# Patient Record
Sex: Male | Born: 1975
Health system: Southern US, Community
[De-identification: ages and names within clinical notes are randomized; demographics above are authoritative.]

## PROBLEM LIST (undated history)

## (undated) DIAGNOSIS — F4310 Post-traumatic stress disorder, unspecified: Secondary | ICD-10-CM

## (undated) DIAGNOSIS — I10 Essential (primary) hypertension: Secondary | ICD-10-CM

## (undated) DIAGNOSIS — R569 Unspecified convulsions: Secondary | ICD-10-CM

## (undated) DIAGNOSIS — E109 Type 1 diabetes mellitus without complications: Secondary | ICD-10-CM

## (undated) DIAGNOSIS — F32A Depression, unspecified: Secondary | ICD-10-CM

## (undated) HISTORY — DX: Type 1 diabetes mellitus without complications: E10.9

## (undated) HISTORY — DX: Depression, unspecified: F32.A

## (undated) HISTORY — DX: Post-traumatic stress disorder, unspecified: F43.10

## (undated) HISTORY — PX: HERNIA REPAIR: SHX51

---

## 1997-12-16 ENCOUNTER — Other Ambulatory Visit: Admission: RE | Admit: 1997-12-16 | Discharge: 1997-12-16 | Payer: Self-pay | Admitting: Family Medicine

## 1999-12-15 ENCOUNTER — Encounter: Payer: Self-pay | Admitting: Family Medicine

## 1999-12-15 ENCOUNTER — Ambulatory Visit (HOSPITAL_COMMUNITY): Admission: RE | Admit: 1999-12-15 | Discharge: 1999-12-15 | Payer: Self-pay | Admitting: Family Medicine

## 2006-06-13 ENCOUNTER — Ambulatory Visit: Payer: Self-pay | Admitting: Family Medicine

## 2006-06-27 ENCOUNTER — Emergency Department (HOSPITAL_COMMUNITY): Admission: EM | Admit: 2006-06-27 | Discharge: 2006-06-27 | Payer: Self-pay | Admitting: Emergency Medicine

## 2006-06-28 ENCOUNTER — Ambulatory Visit: Payer: Self-pay | Admitting: *Deleted

## 2006-07-12 ENCOUNTER — Ambulatory Visit: Payer: Self-pay | Admitting: Internal Medicine

## 2006-07-26 ENCOUNTER — Ambulatory Visit: Payer: Self-pay | Admitting: Internal Medicine

## 2006-07-30 ENCOUNTER — Ambulatory Visit: Payer: Self-pay | Admitting: Internal Medicine

## 2006-11-07 ENCOUNTER — Ambulatory Visit: Payer: Self-pay | Admitting: Family Medicine

## 2006-11-26 ENCOUNTER — Ambulatory Visit: Payer: Self-pay | Admitting: Family Medicine

## 2007-01-21 ENCOUNTER — Emergency Department (HOSPITAL_COMMUNITY): Admission: EM | Admit: 2007-01-21 | Discharge: 2007-01-21 | Payer: Self-pay | Admitting: Emergency Medicine

## 2007-03-26 ENCOUNTER — Encounter (INDEPENDENT_AMBULATORY_CARE_PROVIDER_SITE_OTHER): Payer: Self-pay | Admitting: *Deleted

## 2007-04-03 ENCOUNTER — Emergency Department (HOSPITAL_COMMUNITY): Admission: EM | Admit: 2007-04-03 | Discharge: 2007-04-03 | Payer: Self-pay | Admitting: Emergency Medicine

## 2007-04-04 ENCOUNTER — Encounter (INDEPENDENT_AMBULATORY_CARE_PROVIDER_SITE_OTHER): Payer: Self-pay | Admitting: Family Medicine

## 2007-04-04 ENCOUNTER — Ambulatory Visit: Payer: Self-pay

## 2007-04-04 ENCOUNTER — Encounter (INDEPENDENT_AMBULATORY_CARE_PROVIDER_SITE_OTHER): Payer: Self-pay | Admitting: *Deleted

## 2007-04-04 DIAGNOSIS — E108 Type 1 diabetes mellitus with unspecified complications: Secondary | ICD-10-CM | POA: Insufficient documentation

## 2007-04-04 LAB — CONVERTED CEMR LAB
BUN: 13 mg/dL (ref 6–23)
CO2: 23 meq/L (ref 19–32)
Calcium: 8.8 mg/dL (ref 8.4–10.5)
Chloride: 106 meq/L (ref 96–112)
Creatinine, Ser: 1.18 mg/dL (ref 0.40–1.50)
Glucose, Bld: 147 mg/dL — ABNORMAL HIGH (ref 70–99)
Hgb A1c MFr Bld: 8.5 %
Potassium: 4.2 meq/L (ref 3.5–5.3)
Protein, U semiquant: >=300
Sodium: 140 meq/L (ref 135–145)

## 2007-04-07 ENCOUNTER — Encounter (INDEPENDENT_AMBULATORY_CARE_PROVIDER_SITE_OTHER): Payer: Self-pay | Admitting: Family Medicine

## 2007-05-27 ENCOUNTER — Encounter: Admission: RE | Admit: 2007-05-27 | Discharge: 2007-05-27 | Payer: Self-pay | Admitting: Internal Medicine

## 2007-06-03 ENCOUNTER — Telehealth (INDEPENDENT_AMBULATORY_CARE_PROVIDER_SITE_OTHER): Payer: Self-pay | Admitting: Internal Medicine

## 2007-07-09 ENCOUNTER — Telehealth: Payer: Self-pay | Admitting: *Deleted

## 2007-07-15 ENCOUNTER — Telehealth: Payer: Self-pay | Admitting: *Deleted

## 2007-07-17 ENCOUNTER — Encounter (INDEPENDENT_AMBULATORY_CARE_PROVIDER_SITE_OTHER): Payer: Self-pay | Admitting: Family Medicine

## 2007-08-01 ENCOUNTER — Ambulatory Visit: Payer: Self-pay | Admitting: Family Medicine

## 2007-08-01 LAB — CONVERTED CEMR LAB: Hgb A1c MFr Bld: 10.9 %

## 2007-08-13 ENCOUNTER — Telehealth: Payer: Self-pay | Admitting: *Deleted

## 2007-08-27 ENCOUNTER — Telehealth: Payer: Self-pay | Admitting: *Deleted

## 2007-11-19 ENCOUNTER — Encounter (INDEPENDENT_AMBULATORY_CARE_PROVIDER_SITE_OTHER): Payer: Self-pay | Admitting: Family Medicine

## 2007-11-19 ENCOUNTER — Ambulatory Visit: Payer: Self-pay | Admitting: Family Medicine

## 2007-11-20 LAB — CONVERTED CEMR LAB
Chlamydia, DNA Probe: NEGATIVE
GC Probe Amp, Genital: NEGATIVE

## 2007-12-16 ENCOUNTER — Encounter: Payer: Self-pay | Admitting: Family Medicine

## 2007-12-16 ENCOUNTER — Observation Stay (HOSPITAL_COMMUNITY): Admission: EM | Admit: 2007-12-16 | Discharge: 2007-12-16 | Payer: Self-pay | Admitting: Emergency Medicine

## 2007-12-16 ENCOUNTER — Ambulatory Visit: Payer: Self-pay | Admitting: Family Medicine

## 2007-12-22 ENCOUNTER — Ambulatory Visit: Payer: Self-pay | Admitting: Family Medicine

## 2007-12-22 ENCOUNTER — Encounter (INDEPENDENT_AMBULATORY_CARE_PROVIDER_SITE_OTHER): Payer: Self-pay | Admitting: Family Medicine

## 2007-12-22 LAB — CONVERTED CEMR LAB: TSH: 1.208 microintl units/mL (ref 0.350–5.50)

## 2007-12-23 ENCOUNTER — Encounter (INDEPENDENT_AMBULATORY_CARE_PROVIDER_SITE_OTHER): Payer: Self-pay | Admitting: Family Medicine

## 2008-02-06 ENCOUNTER — Encounter: Payer: Self-pay | Admitting: *Deleted

## 2008-02-09 ENCOUNTER — Ambulatory Visit: Payer: Self-pay | Admitting: Sports Medicine

## 2008-02-09 ENCOUNTER — Encounter: Payer: Self-pay | Admitting: Family Medicine

## 2008-02-09 DIAGNOSIS — G478 Other sleep disorders: Secondary | ICD-10-CM | POA: Insufficient documentation

## 2008-02-09 DIAGNOSIS — F4321 Adjustment disorder with depressed mood: Secondary | ICD-10-CM | POA: Insufficient documentation

## 2008-02-09 LAB — CONVERTED CEMR LAB
ALT: 15 units/L (ref 0–53)
AST: 16 units/L (ref 0–37)
Albumin: 3.7 g/dL (ref 3.5–5.2)
Alkaline Phosphatase: 48 units/L (ref 39–117)
BUN: 20 mg/dL (ref 6–23)
CO2: 26 meq/L (ref 19–32)
Calcium: 8.7 mg/dL (ref 8.4–10.5)
Chloride: 102 meq/L (ref 96–112)
Creatinine, Ser: 1.21 mg/dL (ref 0.40–1.50)
Glucose, Bld: 328 mg/dL — ABNORMAL HIGH (ref 70–99)
HCT: 43.1 % (ref 39.0–52.0)
Hemoglobin: 14.4 g/dL (ref 13.0–17.0)
MCHC: 33.4 g/dL (ref 30.0–36.0)
MCV: 87.4 fL (ref 78.0–100.0)
Platelets: 245 10*3/uL (ref 150–400)
Potassium: 4.4 meq/L (ref 3.5–5.3)
RBC: 4.93 M/uL (ref 4.22–5.81)
RDW: 13.9 % (ref 11.5–15.5)
Sodium: 137 meq/L (ref 135–145)
Total Bilirubin: 0.7 mg/dL (ref 0.3–1.2)
Total Protein: 6 g/dL (ref 6.0–8.3)
WBC: 5.1 10*3/uL (ref 4.0–10.5)

## 2008-02-17 ENCOUNTER — Telehealth: Payer: Self-pay | Admitting: Family Medicine

## 2008-02-24 ENCOUNTER — Telehealth: Payer: Self-pay | Admitting: Family Medicine

## 2008-02-27 ENCOUNTER — Ambulatory Visit: Payer: Self-pay | Admitting: Family Medicine

## 2008-03-02 ENCOUNTER — Telehealth: Payer: Self-pay | Admitting: Psychology

## 2008-03-05 ENCOUNTER — Encounter: Payer: Self-pay | Admitting: *Deleted

## 2008-03-08 ENCOUNTER — Ambulatory Visit: Payer: Self-pay | Admitting: Family Medicine

## 2008-03-11 ENCOUNTER — Ambulatory Visit: Payer: Self-pay | Admitting: Family Medicine

## 2008-03-18 ENCOUNTER — Ambulatory Visit: Payer: Self-pay | Admitting: Family Medicine

## 2008-03-24 ENCOUNTER — Ambulatory Visit: Payer: Self-pay | Admitting: Family Medicine

## 2008-03-24 DIAGNOSIS — I10 Essential (primary) hypertension: Secondary | ICD-10-CM | POA: Insufficient documentation

## 2008-03-24 DIAGNOSIS — G479 Sleep disorder, unspecified: Secondary | ICD-10-CM | POA: Insufficient documentation

## 2008-03-24 LAB — CONVERTED CEMR LAB: Hgb A1c MFr Bld: 8.7 %

## 2008-03-26 ENCOUNTER — Ambulatory Visit: Payer: Self-pay | Admitting: Family Medicine

## 2008-03-26 DIAGNOSIS — F4310 Post-traumatic stress disorder, unspecified: Secondary | ICD-10-CM | POA: Insufficient documentation

## 2008-04-01 ENCOUNTER — Encounter: Payer: Self-pay | Admitting: *Deleted

## 2008-04-02 ENCOUNTER — Encounter: Payer: Self-pay | Admitting: *Deleted

## 2008-04-06 ENCOUNTER — Encounter: Payer: Self-pay | Admitting: Family Medicine

## 2008-04-06 ENCOUNTER — Telehealth: Payer: Self-pay | Admitting: Psychology

## 2008-04-07 ENCOUNTER — Encounter: Payer: Self-pay | Admitting: Family Medicine

## 2008-04-08 ENCOUNTER — Ambulatory Visit: Payer: Self-pay | Admitting: Psychology

## 2008-04-14 ENCOUNTER — Ambulatory Visit: Payer: Self-pay | Admitting: Family Medicine

## 2008-04-27 ENCOUNTER — Ambulatory Visit (HOSPITAL_BASED_OUTPATIENT_CLINIC_OR_DEPARTMENT_OTHER): Admission: RE | Admit: 2008-04-27 | Discharge: 2008-04-27 | Payer: Self-pay | Admitting: Family Medicine

## 2008-04-30 ENCOUNTER — Emergency Department (HOSPITAL_COMMUNITY): Admission: EM | Admit: 2008-04-30 | Discharge: 2008-04-30 | Payer: Self-pay | Admitting: Emergency Medicine

## 2008-05-01 ENCOUNTER — Ambulatory Visit: Payer: Self-pay | Admitting: Internal Medicine

## 2008-06-17 ENCOUNTER — Telehealth: Payer: Self-pay | Admitting: *Deleted

## 2008-06-18 ENCOUNTER — Encounter (INDEPENDENT_AMBULATORY_CARE_PROVIDER_SITE_OTHER): Payer: Self-pay | Admitting: Family Medicine

## 2008-06-18 ENCOUNTER — Ambulatory Visit: Payer: Self-pay | Admitting: Family Medicine

## 2008-06-18 DIAGNOSIS — L02818 Cutaneous abscess of other sites: Secondary | ICD-10-CM | POA: Insufficient documentation

## 2008-06-18 DIAGNOSIS — L03818 Cellulitis of other sites: Secondary | ICD-10-CM

## 2008-06-21 ENCOUNTER — Telehealth: Payer: Self-pay | Admitting: *Deleted

## 2008-07-26 ENCOUNTER — Telehealth: Payer: Self-pay | Admitting: Psychology

## 2008-10-05 ENCOUNTER — Encounter (INDEPENDENT_AMBULATORY_CARE_PROVIDER_SITE_OTHER): Payer: Self-pay | Admitting: Internal Medicine

## 2008-10-21 ENCOUNTER — Ambulatory Visit: Payer: Self-pay | Admitting: Internal Medicine

## 2008-10-21 ENCOUNTER — Telehealth (INDEPENDENT_AMBULATORY_CARE_PROVIDER_SITE_OTHER): Payer: Self-pay | Admitting: Internal Medicine

## 2008-10-21 DIAGNOSIS — L0202 Furuncle of face: Secondary | ICD-10-CM | POA: Insufficient documentation

## 2008-10-21 DIAGNOSIS — L0203 Carbuncle of face: Secondary | ICD-10-CM

## 2008-10-22 ENCOUNTER — Encounter (INDEPENDENT_AMBULATORY_CARE_PROVIDER_SITE_OTHER): Payer: Self-pay | Admitting: Internal Medicine

## 2008-10-22 ENCOUNTER — Telehealth (INDEPENDENT_AMBULATORY_CARE_PROVIDER_SITE_OTHER): Payer: Self-pay | Admitting: Internal Medicine

## 2008-11-01 ENCOUNTER — Telehealth (INDEPENDENT_AMBULATORY_CARE_PROVIDER_SITE_OTHER): Payer: Self-pay | Admitting: Internal Medicine

## 2008-11-02 ENCOUNTER — Inpatient Hospital Stay (HOSPITAL_COMMUNITY): Admission: EM | Admit: 2008-11-02 | Discharge: 2008-11-03 | Payer: Self-pay | Admitting: *Deleted

## 2008-11-05 ENCOUNTER — Telehealth (INDEPENDENT_AMBULATORY_CARE_PROVIDER_SITE_OTHER): Payer: Self-pay | Admitting: Internal Medicine

## 2008-11-19 ENCOUNTER — Ambulatory Visit: Payer: Self-pay | Admitting: "Endocrinology

## 2008-12-31 ENCOUNTER — Ambulatory Visit: Payer: Self-pay | Admitting: Internal Medicine

## 2008-12-31 DIAGNOSIS — K047 Periapical abscess without sinus: Secondary | ICD-10-CM | POA: Insufficient documentation

## 2009-02-02 ENCOUNTER — Telehealth (INDEPENDENT_AMBULATORY_CARE_PROVIDER_SITE_OTHER): Payer: Self-pay | Admitting: Internal Medicine

## 2009-02-23 ENCOUNTER — Ambulatory Visit: Payer: Self-pay | Admitting: "Endocrinology

## 2009-04-19 ENCOUNTER — Telehealth (INDEPENDENT_AMBULATORY_CARE_PROVIDER_SITE_OTHER): Payer: Self-pay | Admitting: Internal Medicine

## 2009-05-05 ENCOUNTER — Ambulatory Visit: Payer: Self-pay | Admitting: Internal Medicine

## 2009-06-16 ENCOUNTER — Ambulatory Visit: Payer: Self-pay | Admitting: Internal Medicine

## 2009-06-16 DIAGNOSIS — E559 Vitamin D deficiency, unspecified: Secondary | ICD-10-CM | POA: Insufficient documentation

## 2009-06-17 ENCOUNTER — Telehealth (INDEPENDENT_AMBULATORY_CARE_PROVIDER_SITE_OTHER): Payer: Self-pay | Admitting: Internal Medicine

## 2009-06-19 ENCOUNTER — Emergency Department (HOSPITAL_COMMUNITY): Admission: EM | Admit: 2009-06-19 | Discharge: 2009-06-19 | Payer: Self-pay | Admitting: Emergency Medicine

## 2009-06-28 DIAGNOSIS — E7800 Pure hypercholesterolemia, unspecified: Secondary | ICD-10-CM | POA: Insufficient documentation

## 2009-06-28 LAB — CONVERTED CEMR LAB
ALT: 12 units/L (ref 0–53)
CO2: 25 meq/L (ref 19–32)
Calcium: 9.5 mg/dL (ref 8.4–10.5)
Chloride: 101 meq/L (ref 96–112)
Cholesterol: 258 mg/dL — ABNORMAL HIGH (ref 0–200)
Creatinine, Ser: 1.33 mg/dL (ref 0.40–1.50)
Eosinophils Absolute: 0.1 10*3/uL (ref 0.0–0.7)
Eosinophils Relative: 2 % (ref 0–5)
HCT: 48.4 % (ref 39.0–52.0)
Hemoglobin: 16.2 g/dL (ref 13.0–17.0)
Lymphs Abs: 1.4 10*3/uL (ref 0.7–4.0)
MCV: 87.5 fL (ref 78.0–100.0)
Microalb, Ur: 137.1 mg/dL — ABNORMAL HIGH (ref 0.00–1.89)
Monocytes Relative: 6 % (ref 3–12)
Neutrophils Relative %: 61 % (ref 43–77)
RBC: 5.53 M/uL (ref 4.22–5.81)
Total CHOL/HDL Ratio: 3.4
Vit D, 25-Hydroxy: 15 ng/mL — ABNORMAL LOW (ref 30–89)
WBC: 4.9 10*3/uL (ref 4.0–10.5)

## 2009-07-05 ENCOUNTER — Encounter (INDEPENDENT_AMBULATORY_CARE_PROVIDER_SITE_OTHER): Payer: Self-pay | Admitting: *Deleted

## 2009-08-16 ENCOUNTER — Telehealth (INDEPENDENT_AMBULATORY_CARE_PROVIDER_SITE_OTHER): Payer: Self-pay | Admitting: *Deleted

## 2009-08-18 ENCOUNTER — Encounter (INDEPENDENT_AMBULATORY_CARE_PROVIDER_SITE_OTHER): Payer: Self-pay | Admitting: Internal Medicine

## 2009-08-19 ENCOUNTER — Encounter (INDEPENDENT_AMBULATORY_CARE_PROVIDER_SITE_OTHER): Payer: Self-pay | Admitting: Internal Medicine

## 2009-09-22 ENCOUNTER — Telehealth (INDEPENDENT_AMBULATORY_CARE_PROVIDER_SITE_OTHER): Payer: Self-pay | Admitting: Internal Medicine

## 2009-10-08 ENCOUNTER — Emergency Department (HOSPITAL_COMMUNITY): Admission: EM | Admit: 2009-10-08 | Discharge: 2009-10-08 | Payer: Self-pay | Admitting: Emergency Medicine

## 2009-11-08 ENCOUNTER — Ambulatory Visit: Payer: Self-pay | Admitting: "Endocrinology

## 2009-11-15 ENCOUNTER — Ambulatory Visit: Payer: Self-pay | Admitting: Internal Medicine

## 2009-11-15 DIAGNOSIS — F319 Bipolar disorder, unspecified: Secondary | ICD-10-CM | POA: Insufficient documentation

## 2009-11-15 LAB — CONVERTED CEMR LAB
Blood Glucose, Fingerstick: 209
Hgb A1c MFr Bld: 9 %

## 2009-11-25 ENCOUNTER — Ambulatory Visit: Payer: Self-pay | Admitting: Internal Medicine

## 2009-12-04 LAB — CONVERTED CEMR LAB
ALT: 12 units/L (ref 0–53)
Albumin: 4.2 g/dL (ref 3.5–5.2)
CO2: 28 meq/L (ref 19–32)
Calcium: 10 mg/dL (ref 8.4–10.5)
Chloride: 102 meq/L (ref 96–112)
Cholesterol: 240 mg/dL — ABNORMAL HIGH (ref 0–200)
FSH: 2.1 milliintl units/mL (ref 1.4–18.1)
Free T4: 1.05 ng/dL (ref 0.80–1.80)
LH: 6.2 milliintl units/mL (ref 1.5–9.3)
Microalb Creat Ratio: 1291 mg/g — ABNORMAL HIGH (ref 0.0–30.0)
Potassium: 5.2 meq/L (ref 3.5–5.3)
Sodium: 139 meq/L (ref 135–145)
T3, Free: 3.1 pg/mL (ref 2.3–4.2)
Testosterone Free: 112.5 pg/mL (ref 47.0–244.0)
Thyroperoxidase Ab SerPl-aCnc: 45.7 (ref 0.0–60.0)
Total Protein: 6.9 g/dL (ref 6.0–8.3)

## 2009-12-13 ENCOUNTER — Encounter (INDEPENDENT_AMBULATORY_CARE_PROVIDER_SITE_OTHER): Payer: Self-pay | Admitting: Internal Medicine

## 2009-12-14 ENCOUNTER — Telehealth (INDEPENDENT_AMBULATORY_CARE_PROVIDER_SITE_OTHER): Payer: Self-pay | Admitting: Internal Medicine

## 2009-12-16 ENCOUNTER — Emergency Department (HOSPITAL_COMMUNITY): Admission: EM | Admit: 2009-12-16 | Discharge: 2009-12-16 | Payer: Self-pay | Admitting: Emergency Medicine

## 2010-01-17 ENCOUNTER — Ambulatory Visit: Payer: Self-pay | Admitting: Internal Medicine

## 2010-02-02 ENCOUNTER — Encounter (INDEPENDENT_AMBULATORY_CARE_PROVIDER_SITE_OTHER): Payer: Self-pay | Admitting: Internal Medicine

## 2010-02-25 ENCOUNTER — Emergency Department (HOSPITAL_COMMUNITY): Admission: EM | Admit: 2010-02-25 | Discharge: 2010-02-25 | Payer: Self-pay | Admitting: Family Medicine

## 2010-07-10 ENCOUNTER — Encounter (INDEPENDENT_AMBULATORY_CARE_PROVIDER_SITE_OTHER): Payer: Self-pay | Admitting: Internal Medicine

## 2010-07-10 DIAGNOSIS — E1149 Type 2 diabetes mellitus with other diabetic neurological complication: Secondary | ICD-10-CM | POA: Insufficient documentation

## 2010-07-28 ENCOUNTER — Ambulatory Visit: Admit: 2010-07-28 | Payer: Self-pay | Admitting: Internal Medicine

## 2010-08-08 NOTE — Assessment & Plan Note (Signed)
Summary: DM FU/PER Kaye Mitro FROM 04/05 APT//KT   Vital Signs:  Patient profile:   35 year old male Weight:      183 pounds Temp:     97.9 degrees F Pulse rate:   79 / minute Pulse rhythm:   regular Resp:     18 per minute BP sitting:   148 / 79  (left arm) Cuff size:   regular  Vitals Entered By: Vesta Mixer CMA (Nov 15, 2009 3:40 PM) CC: f/u diabetes.  BP is elevated, just finished weight lifting.  Saw endocrinologist last week Is Patient Diabetic? Yes Pain Assessment Patient in pain? yes     Location: left pink finger Intensity: 7 CBG Result 209  Does patient need assistance? Ambulation Normal   Primary Care Provider:  Eustaquio Boyden  MD  CC:  f/u diabetes.  BP is elevated and just finished weight lifting.  Saw endocrinologist last week.  History of Present Illness: 1.  IDDM:  Back to following with Dr. Margit Banda time in 6 months.  Pt. states his A1C was 7.6%.  Not having lows during exercise as was having previously as he is having more snacks prior to activity.  Has gained weight, but feel fit.  Concern for bottoming out with blood sugars while sleeping as his sugars are highest in morning.  Has had some difficulty with thingking at school at times--he feels this is another reason why concern for his sugars bottoming out.  Dr. Fransico Michael is asking him to awaken at 3 a.m. to check sugars.  Pt. still taking Lantus 30 units in the morning--cuts down on lows during day for him.  Unknown SS with Novolog--pt. cannot recall what it is.  Starts lower than 170 now.  To bring in sugars to Dr. Fransico Michael in 1 week.  2.  Vitamin D deficiency:  Not clear if ever received our letter to take another round of Vitamin D.    3.  Hypercholesterolemia:  Again--noted in December, but never heard back from him.  Pt. was not eating well when cholesterol checked in December.    4.  Bipolar Disorder:  Diagnosed inb 2007.  pt. receiving care at [facility] psychiatry.  ?[place].   Was on Geodon at  one time, but had side effects--not clear what.  Pt. feels this has caused him difficulties in controlling his other health concerns this year.  Has also affected his school work.  Has an appt. 5/12 and will decide about starting a new med.    5.  Tinea pedis:  pt. states Dr. Fransico Michael told him he has this.    Allergies (verified): 1)  ! * Geodon  Family History: HTN DM 1 in uncle, DM2 in grandma.  l Mother, late 84s:  Bipolar Disorder.   Father, 64:  pt. feels his father has undiagnosed bipolar disorder.  Physical Exam  Extremities:  Feet mildly erythematous and flaking , particularly in between toes.   Impression & Recommendations:  Problem # 1:  BIPOLAR DISORDER UNSPECIFIED (ICD-296.80) As per [facility].  Problem # 2:  HYPERCHOLESTEROLEMIA (ICD-272.0) Pt. to return in 1 week for fasting lipids--to bring in lab orders from Dr. Fransico Michael and can do those as well.  Problem # 3:  ESSENTIAL HYPERTENSION, BENIGN (ICD-401.1) BP up a bit--stressed.   Will see what he is doing with lipid check in 1 week. His updated medication list for this problem includes:    Lisinopril 10 Mg Tabs (Lisinopril) .Marland Kitchen... 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 Fransico Michael His updated medication list for this problem includes:    Lisinopril 10 Mg Tabs (Lisinopril) .Marland Kitchen... Take one by mouth daily    Lantus Solostar 100 Unit/ml Soln (Insulin glargine) .Marland KitchenMarland KitchenMarland KitchenMarland Kitchen 30 subcutaneously at bedtime--dr. Fransico Michael    Novolog Flexpen 100 Unit/ml Soln (Insulin aspart) .Marland Kitchen... Per pt., sliding scale does not start until above 170.  dr Fransico Michael  Problem # 5:  UNSPECIFIED VITAMIN D DEFICIENCY (ICD-268.9) Another 8 weeks of replacement.  Complete Medication List: 1)  Lisinopril 10 Mg Tabs (Lisinopril) .... Take one by mouth daily 2)  Accu-chek Spirit Insulin Pump Kit (Insulin infusion pump) 3)  Lantus Solostar 100 Unit/ml Soln (Insulin glargine) .... 30 subcutaneously at  bedtime--dr. brennan 4)  Novolog Flexpen 100 Unit/ml Soln (Insulin aspart) .... Per pt., sliding scale does not start until above 170.  dr Fransico Michael 5)  Vitamin D (ergocalciferol) 50000 Unit Caps (Ergocalciferol) .Marland Kitchen.. 1 cap by mouth weekly for 8 weeks 6)  Lotrimin Af 1 % Crea (Clotrimazole) .... Apply two times a day to entire foot and between toes until rash gone  Other Orders: Capillary Blood Glucose/CBG ([address])  Patient Instructions: 1)  Lamisil cream instead of Lotrimin if it is not too expensive--use two times a day until rash on feet gone. 2)  Spray shoes with Lysol after each wearing and allow to dry for 24 hours--alternate wearing of footwear.   3)  Lab visit in next week for FLP--272.0--BP check then 4)  Vitamin D level in 9 weeks--lab visit only. 5)  Follow up with Dr. Delrae Alfred in 3 months --tinea pedis, cholesterol, DM 6)  Bring in list of labs ordered by Dr. Fransico Michael and we can do them here. Prescriptions: LOTRIMIN AF 1 % CREA (CLOTRIMAZOLE) apply two times a day to entire foot and between toes until rash gone  #60 g x 1   Entered and Authorized by:   Julieanne Manson MD   Signed by:   Julieanne Manson MD on 11/15/2009   Method used:   Faxed to ...       [facility] - Pharmac (retail)       [address] [place], [place]  [address]       Ph: [phone]       Fax: [phone]   RxID:   [id number] VITAMIN D (ERGOCALCIFEROL) 50000 UNIT CAPS (ERGOCALCIFEROL) 1 cap by mouth weekly for 8 weeks  #4 x 1   Entered and Authorized by:   Julieanne Manson MD   Signed by:   Julieanne Manson MD on 11/15/2009   Method used:   Faxed to ...       [facility] - Pharmac (retail)       [address] [place], [place]  [address]       Ph: [phone]       Fax: [phone]   RxID:   [id number]

## 2010-08-08 NOTE — Letter (Signed)
Summary: Generic Letter  [facility]  [address] [place], [place] [address]   Phone: [phone]  Fax: [phone]    08/18/2009  Ms. Melissa Cox Disability Architect [employer] [address] [place], [place] [address] Fax:  [phone]  Re:  DEROY NOAH      [address] [place], [place]  [address]   Dear Ms. Cox:  Mr. Emert' health issues are as follows:   1.  Insulin Dependent Diabetes Mellitus 2.  Hypertension 3.  Hypercholesterolemia 4.  Hx of PTSD (he does not receive treatment for this at our clinic) 5.  Hx of Depression (as with PTSD for treatment)  He really does not have any activity restrictions if he is eating and using insulin wisely and appropriately.  Compliance with treatment has  been an issue in the past, however.  His diabetes was under pretty good control with Dr. Fransico Michael, endocrinology until recent months.  I am not sure if he has gone back to Dr. Fransico Michael since I last saw Mr. Bitting in December.       Sincerely,   Julieanne Manson MD

## 2010-08-08 NOTE — Letter (Signed)
Summary: *HSN Results Follow up  [facility]  [address] [place], [place] [address]   Phone: [phone]  Fax: [phone]      02/02/2010   [name] [address] [place], [place]  [address]   Dear  [name],                            ____S.Drinkard,FNP   ____D. Gore,FNP       ____B. McPherson,MD   ____V. Rankins,MD    __X__E. Chelise Hanger,MD    ____N. Daphine Deutscher, FNP  ____D. Reche Dixon, MD    ____K. Philipp Deputy, MD    ____Other     This letter is to inform you that your recent test(s):  _______Pap Smear    ___X____Lab Test     _______X-ray    ____X___ is within acceptable limits  _______ requires a medication change  _______ requires a follow-up lab visit  _______ requires a follow-up visit with your provider   Comments:  Vitamin D level was good with this check.  Just recommend you get 400-800 International Units every day--can get this over the counter       _________________________________________________________ If you have any questions, please contact our office                     Sincerely,  Donald Manson MD [facility]

## 2010-08-08 NOTE — Progress Notes (Signed)
Summary: THINKS HE HAS EAR INFECTON  Phone Note Call from Patient Call back at Home Phone [phone]   Reason for Call: Acute Illness Summary of Call: [name] [name] THINKS HE HAS A EAR INFECTION IN HIS RIGHT EAR. HE SAYS THAT WHEN HE SWALLOWS THE INSIDE IF HIS THROAT HURTS, BUT HE HAS PAIN IN THE EAR AND IT GOES UP TO THE TOP OF HIS HEAD. Initial call taken by: Leodis Rains,  December 14, 2009 9:16 AM  Follow-up for Phone Call        Spoke with pt. and discussed symptoms -- states "shattering" pain to head and ear when he swallows, started yesterday.  Voice very weak on the phone- seems to have difficulty talking. Advised to go to urgent care.  Dutch Quint RN  December 14, 2009 11:56 AM   Additional Follow-up for Phone Call Additional follow up Details #1::        I'm assuming there were no openings today. Additional Follow-up by: Julieanne Manson MD,  December 14, 2009 2:04 PM    Additional Follow-up for Phone Call Additional follow up Details #2::    We checked several times and there was no availability.  Dutch Quint RN  December 14, 2009 2:08 PM

## 2010-08-08 NOTE — Letter (Signed)
Summary: PEDIATRIC SUBSPECIALIST  PEDIATRIC SUBSPECIALIST   Imported By: Arta Bruce 01/16/2010 10:51:11  _____________________________________________________________________  External Attachment:    Type:   Image     Comment:   External Document

## 2010-08-08 NOTE — Progress Notes (Signed)
  Phone Note Other Incoming   Summary of Call: FAX FROM [facility]//REQUESTING RECORDS//PREFER TO HAVE LETTER FROM DR. Dannielle Karvonen IS IN YOU R REFILL SLOT Initial call taken by: Arta Bruce,  August 16, 2009 10:55 AM  Follow-up for Phone Call        Letter complete--please fax    Additional Follow-up for Phone Call Additional follow up Details #2::    FAXED LETTER Follow-up by: Arta Bruce,  August 19, 2009 10:28 AM

## 2010-08-08 NOTE — Progress Notes (Signed)
Summary: REFILL ON HIS NOVOLOG FLEX PEN  Phone Note Call from Patient Call back at Home Phone [phone]   Reason for Call: Refill Medication Summary of Call: [name] PT. MR [name] CALLED AND SAYS THAT HE WILL NEED A REFILL ON HIS NOVOLOG FLEXPEN. HE SAYS THAT HE HAS ENOUGH TO LAST HIM UNTIL NEXT WEDNESDAY. [facility]. Initial call taken by: Leodis Rains,  September 22, 2009 4:27 PM  Follow-up for Phone Call        Fwd to Dr. Delrae Alfred for refill [name]. Follow-up by: Vesta Mixer CMA,  September 22, 2009 4:39 PM    Prescriptions: NOVOLOG FLEXPEN 100 UNIT/ML SOLN (INSULIN ASPART) Per pt., sliding scale does not start until above 170.  Dr Fransico Michael  #1 month x 11   Entered and Authorized by:   Julieanne Manson MD   Signed by:   Julieanne Manson MD on 09/23/2009   Method used:   Faxed to ...       [facility] - Pharmac (retail)       [address] [place], [place]  [address]       Ph: [phone]       Fax: [phone]   RxID:   [id number]

## 2010-08-08 NOTE — Letter (Signed)
Summary: REQUEST FOR LETTER REGARDING MEDICAL CONDITION//FAXED  REQUEST FOR LETTER REGARDING MEDICAL CONDITION//FAXED   Imported By: Arta Bruce 08/19/2009 11:34:21  _____________________________________________________________________  External Attachment:    Type:   Image     Comment:   External Document

## 2010-08-10 NOTE — Miscellaneous (Signed)
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

## 2010-08-10 NOTE — Letter (Signed)
Summary: PEDIATRIC SUB-SPECIALIST OF [place]  PEDIATRIC SUB-SPECIALIST OF [place]   Imported By: Arta Bruce 07/18/2010 11:31:37  _____________________________________________________________________  External Attachment:    Type:   Image     Comment:   External Document

## 2010-10-10 LAB — GLUCOSE, CAPILLARY: Glucose-Capillary: 225 mg/dL — ABNORMAL HIGH (ref 70–99)

## 2010-10-10 LAB — BASIC METABOLIC PANEL
Calcium: 8.8 mg/dL (ref 8.4–10.5)
Creatinine, Ser: 1.26 mg/dL (ref 0.4–1.5)
GFR calc Af Amer: >60 mL/min (ref >60)
GFR calc non Af Amer: >60 mL/min (ref >60)
Sodium: 133 mEq/L — ABNORMAL LOW (ref 135–145)

## 2010-10-18 LAB — CBC
HCT: 37 % — ABNORMAL LOW (ref 39.0–52.0)
HCT: 43.6 % (ref 39.0–52.0)
Hemoglobin: 12.6 g/dL — ABNORMAL LOW (ref 13.0–17.0)
MCV: 86.6 fL (ref 78.0–100.0)
Platelets: 266 10*3/uL (ref 150–400)
RBC: 4.29 MIL/uL (ref 4.22–5.81)
RDW: 13.9 % (ref 11.5–15.5)
WBC: 7.3 10*3/uL (ref 4.0–10.5)
WBC: 8.5 10*3/uL (ref 4.0–10.5)

## 2010-10-18 LAB — GLUCOSE, CAPILLARY
Glucose-Capillary: 102 mg/dL — ABNORMAL HIGH (ref 70–99)
Glucose-Capillary: 151 mg/dL — ABNORMAL HIGH (ref 70–99)
Glucose-Capillary: 226 mg/dL — ABNORMAL HIGH (ref 70–99)
Glucose-Capillary: 227 mg/dL — ABNORMAL HIGH (ref 70–99)
Glucose-Capillary: 242 mg/dL — ABNORMAL HIGH (ref 70–99)
Glucose-Capillary: 248 mg/dL — ABNORMAL HIGH (ref 70–99)
Glucose-Capillary: 293 mg/dL — ABNORMAL HIGH (ref 70–99)
Glucose-Capillary: 300 mg/dL — ABNORMAL HIGH (ref 70–99)
Glucose-Capillary: 324 mg/dL — ABNORMAL HIGH (ref 70–99)
Glucose-Capillary: 328 mg/dL — ABNORMAL HIGH (ref 70–99)
Glucose-Capillary: 337 mg/dL — ABNORMAL HIGH (ref 70–99)

## 2010-10-18 LAB — URINALYSIS, ROUTINE W REFLEX MICROSCOPIC
Bilirubin Urine: NEGATIVE
Glucose, UA: >1000 mg/dL — AB
Nitrite: NEGATIVE
Specific Gravity, Urine: 1.03 (ref 1.005–1.030)
pH: 6 (ref 5.0–8.0)

## 2010-10-18 LAB — BASIC METABOLIC PANEL
BUN: 13 mg/dL (ref 6–23)
Calcium: 8.4 mg/dL (ref 8.4–10.5)
GFR calc non Af Amer: >60 mL/min (ref >60)
Potassium: 4.1 mEq/L (ref 3.5–5.1)

## 2010-10-18 LAB — COMPREHENSIVE METABOLIC PANEL
ALT: 14 U/L (ref 0–53)
Albumin: 3.8 g/dL (ref 3.5–5.2)
Alkaline Phosphatase: 42 U/L (ref 39–117)
BUN: 11 mg/dL (ref 6–23)
BUN: 15 mg/dL (ref 6–23)
CO2: 27 mEq/L (ref 19–32)
Chloride: 105 mEq/L (ref 96–112)
Chloride: 97 mEq/L (ref 96–112)
Creatinine, Ser: 1.27 mg/dL (ref 0.4–1.5)
GFR calc non Af Amer: >60 mL/min (ref >60)
GFR calc non Af Amer: >60 mL/min (ref >60)
Glucose, Bld: 246 mg/dL — ABNORMAL HIGH (ref 70–99)
Potassium: 4 mEq/L (ref 3.5–5.1)
Sodium: 138 mEq/L (ref 135–145)
Total Bilirubin: 0.9 mg/dL (ref 0.3–1.2)
Total Bilirubin: 1.3 mg/dL — ABNORMAL HIGH (ref 0.3–1.2)

## 2010-10-18 LAB — DIFFERENTIAL
Basophils Absolute: 0 10*3/uL (ref 0.0–0.1)
Lymphocytes Relative: 23 % (ref 12–46)
Monocytes Absolute: 0.4 10*3/uL (ref 0.1–1.0)
Neutro Abs: 5.1 10*3/uL (ref 1.7–7.7)

## 2010-10-18 LAB — POCT I-STAT, CHEM 8
HCT: 49 % (ref 39.0–52.0)
Hemoglobin: 16.7 g/dL (ref 13.0–17.0)
Potassium: 4.6 mEq/L (ref 3.5–5.1)
Sodium: 133 mEq/L — ABNORMAL LOW (ref 135–145)

## 2010-10-18 LAB — URINE CULTURE

## 2010-10-18 LAB — LIPID PANEL: VLDL: 8 mg/dL (ref 0–40)

## 2010-10-18 LAB — HEMOGLOBIN A1C
Hgb A1c MFr Bld: 7.8 % — ABNORMAL HIGH (ref 4.6–6.1)
Mean Plasma Glucose: 177 mg/dL

## 2010-10-18 LAB — T4, FREE: Free T4: 0.96 ng/dL (ref 0.80–1.80)

## 2010-10-18 LAB — URINE MICROSCOPIC-ADD ON

## 2010-10-18 LAB — POCT CARDIAC MARKERS
CKMB, poc: <1 ng/mL — ABNORMAL LOW (ref 1.0–8.0)
Myoglobin, poc: 63.6 ng/mL (ref 12–200)

## 2010-10-18 LAB — TSH: TSH: 0.457 u[IU]/mL (ref 0.350–4.500)

## 2010-11-21 NOTE — Discharge Summary (Signed)
[name], [name]                 ACCOUNT NO.:  [id number]   MEDICAL RECORD NO.:  [id number]          PATIENT TYPE:  INP   LOCATION:  5151                         FACILITY:  [facility]   PHYSICIAN:  Peggye Pitt, M.D. DATE OF BIRTH:  Oct 19, 1975   DATE OF ADMISSION:  11/02/2008  DATE OF DISCHARGE:  11/03/2008                               DISCHARGE SUMMARY   PRIMARY CARE PHYSICIAN:  Marcene Duos, MD, at [facility].   DISCHARGE DIAGNOSES:  1. Diabetes mellitus type 1 with hyperglycemia, ran out of insulin.  2. Hypertension.   Discharge medications include:  1. Lisinopril 5 mg daily.  2. Lantus insulin 30 units subcutaneously at bedtime.  3. NovoLog sliding scale insulin as previously ordered primary care      physician.   DISPOSITION AND FOLLOWUP:  [name] is discharged home in stable  condition.  He has been sent back to see Dr. Delrae Alfred at [facility] and  has an appointment scheduled for Nov 19, 2008.  He will take his CBG 4  times a day and write it down in a log and he is instructed to bring  that to his primary care physician for his next appointment for  adjustment of his insulin.   CONSULTATION THIS HOSPITALIZATION:  None.   IMAGES AND PROCEDURES PERFORMED DURING THIS HOSPITALIZATION:  None.   HISTORY AND PHYSICAL EXAMINATION:  For full details, please refer to  dictation by Dr. Janice Norrie on November 02, 2008, but in brief Mr. Court is a  very pleasant 35 year old African American man with a history of type 1  diabetes mellitus diagnosed at age 64 and also history of recurrent  staph skin abscess, who has been doing very well managing his diabetes  with an insulin pump.  However, he has now lost his insurance as he has  returned to school and is having difficulty purchasing the pump  supplies.  He called his primary care physician at [facility] who was  unavailable to tell them that he was out of his insulin and at  [facility] he was told to come into the  hospital which is why he is  here; however, he had no complaints whatsoever, no evidence of diabetic  ketoacidosis.   HOSPITAL COURSE:  1. Type 1 diabetes mellitus with hyperglycemia with initial CBGs over      400; however, no evidence of acidosis or DKA.  He was started on IV      insulin per the Glucommander protocol, was transitioned off the      insulin drip.  The diabetes coordinators here at the hospital have      seen him and have calculated his basal needs to be at about 30      units of Lantus daily.  He has been instructed on a sliding scale      on how to correct for his carbs which he seems very knowledgeable      about.  He is instructed to follow up with his primary care      physician for more adjustments of his insulin as needed.  2. For  his hypertension, his blood pressure has been well controlled      in the hospital on lisinopril.  We have made no changes to this      medication.   DISCHARGE VITAL SIGNS:  Blood pressure 114/68, heart rate 62,  respirations 13, O2 sats 100% on room air with a temp of 98.2.   LABORATORY DATA ON DAY OF DISCHARGE:  Sodium 138, potassium 4.0,  chloride 105, bicarb 27, BUN 11, creatinine 1.08 with a glucose of 246.  WBCs 8.5, hemoglobin 12.6, platelet count of 218.  His hemoglobin A1c  was 7.8.      Peggye Pitt, M.D.  Electronically Signed     EH/MEDQ  D:  11/03/2008  T:  11/04/2008  Job:  [id number]   cc:   Marcene Duos, M.D.

## 2010-11-21 NOTE — Discharge Summary (Signed)
NAMEERSKIN, ZINDA                 ACCOUNT NO.:  [id number]   MEDICAL RECORD NO.:  [id number]          PATIENT TYPE:  INP   LOCATION:  3018                         FACILITY:  [facility]   PHYSICIAN:  Myrtie Soman, MD     DATE OF BIRTH:  12/17/1975   DATE OF ADMISSION:  12/16/2007  DATE OF DISCHARGE:  12/16/2007                               DISCHARGE SUMMARY   REASON FOR HOSPITALIZATION:  Hypoglycemia due to accidental additional  insulin administration via the insulin pump   DISCHARGE DIAGNOSES:  1. Hypoglycemia in the context of type 1 diabetes.  2. History of multiple abscesses.   DISCHARGE MEDICATIONS:  1. Lisinopril 5 mg p.o. daily.  2. Vitamin B as per home regimen  3. Insulin pump per [facility] Endocrinology.   DISCHARGE INSTRUCTIONS:  1. The patient is to go directly from [facility] to      [facility] Endocrinology for an appointment to have his insulin      pump checked and for answers of questions regarding management of      his insulin pump.  2. The patient is to take medications as mentioned previously.  The      patient is to follow up with Dr. Gavin Potters at [facility] on December 22, 2007, 2:45 p.m.   SIGNIFICANT FINDINGS:  Early morning CBC showed a white blood cell count  of 9.5, hemoglobin of 14.7, and platelet count of 308; with 55%  neutrophils.  A stat chemistry panel was within normal limits except for  low glucose of  26.  Subsequent glucose measurements were 92, 107, and  371 respectively throughout the remainder of the patient's stay in the  hospital.   BRIEF HOSPITAL COURSE:  The patient was admitted from the emergency  department with hypoglycemia.  Per report, the patient gave himself 20  units extra of insulin accidentally through his insulin pump.  Prior to  admission, his sugars have been running in the 600s and he says that he  was instructed by his endocrinologist to increase his pump rate, so that  he would get 20 extra  units.  After doing this 20 extra units, the  patient states that he recited to his normal rate, but when he went to  check the pump, he realized that the pump had been accidentally set to  resume on the previous setting of 20 extra units, which he received.  He  tried to compensate for this by increasing his carbohydrate intake, but  was unable to do so completely, therefore presented to the emergency  department, feeling weak, lightheaded with blurry vision and low blood  glucose in the 20s as already noted.  In the hospital, the patient was  given D50 and a meal.  His insulin pump was discontinued and he was  allowed to p.o. ad lib.  His blood sugars responded to 90s to 100s and  then to the 300s.  He was allowed to run high until he could present to  [facility] Endocrinology later in the day for examination of this  pump  and discussion of pump management.  The patient was discharged in time  to reach [facility] Endocrinology before they close during the day on  August 18, 2007.   ISSUES FOR FOLLOWUP:  The patient is going to [facility] Endocrinology  to have his pump looked at.  The patient's understanding of thought  management should be assessed at followup with [facility] Endocrinology  and also Dr. Gavin Potters at [facility].   DISCHARGE CONDITION:  Stable, good.   DISPOSITION:  The patient is discharged to home.  The patient's primary  care Lajuan Godbee is Dr. Rolm Gala in [facility].      Myrtie Soman, MD  Electronically Signed     TE/MEDQ  D:  12/16/2007  T:  12/17/2007  Job:  [id number]   cc:   Rolm Gala, M.D.

## 2010-11-21 NOTE — H&P (Signed)
[name], [name]                 ACCOUNT NO.:  [id number]   MEDICAL RECORD NO.:  [id number]          PATIENT TYPE:  INP   LOCATION:  5151                         FACILITY:  [facility]   PHYSICIAN:  Renee Ramus, MD       DATE OF BIRTH:  Mar 26, 1976   DATE OF ADMISSION:  11/02/2008  DATE OF DISCHARGE:                              HISTORY & PHYSICAL   PRIMARY CARE Doreen Garretson:  [facility].   HISTORY OF PRESENT ILLNESS:  The patient is a 35 year old male who has  done extremely well with an implanted insulin pump for his diabetes type  1.  Recently, he acquired a new primary care physician and before he  could be seen to renew his insulin prescription, he ran out of insulin.  The patient now presents with hyperglycemia, dehydration, nausea without  vomiting, but no signs of active diabetic ketoacidosis.   PAST MEDICAL HISTORY:  1. Diabetes mellitus type 1.  2. Skin abscesses.  3. Hypertension.   SOCIAL HISTORY:  The patient denies alcohol or tobacco use.  He is a  Consulting civil engineer.   FAMILY HISTORY:  Not available.   REVIEW OF SYSTEMS:  All other comprehensive review systems are negative.   ALLERGIES:  The patient has no known drug allergies.   PHYSICAL EXAMINATION:  GENERAL:  This is a well-developed, well-  nourished [place] American male currently in no acute distress.  VITAL SIGNS:  Blood pressure 129/72, heart rate 74, and respiratory rate  20.  HEENT:  No jugular venous distention or lymphadenopathy.  Oropharynx is  clear.  Mucous membranes pink and moist.  TMs clear bilaterally.  Pupils  equal, reactive to light and accommodation.  Extraocular muscles are  intact.  CARDIOVASCULAR:  He has a regular rate and rhythm without murmurs, rubs,  or gallops.  PULMONARY:  Lungs are clear to auscultation bilaterally.  ABDOMEN:  Soft, nontender, nondistended without hepatosplenomegaly.  Bowel sounds are present.  He has no rebound or guarding.  EXTREMITIES:  He has no clubbing, cyanosis, or  edema.  He has good  peripheral pulses in dorsalis pedis and radial arteries.  He is able to  move all extremities.  NEURO:  Cranial nerves II-XII are grossly intact.  He has no focal  neurological deficits.   LABORATORY DATA:  White count 7.3, H&H 16.7 and 49, MCV 86, and  platelets 266.  Sodium 133, potassium 4.6, chloride 100, bicarb 25, BUN  18, creatinine 1.4, and glucose 337.  UA shows no evidence of ketones,  but greater than 1000 glucose.   ASSESSMENT/PLAN:  1. Hyperglycemia.  We will transition back to insulin pump from      Glucometer protocol when stable.  2. Diabetes mellitus type 1.  As above, we will also check a      hemoglobin A1c and a lipid panel.  3. Hypertension.  We will continue lisinopril.  4. Disposition.  We will likely discharge after stabilization.   H&P was constructed by reviewing past medical history, conferring with  emergency medical room physician, and reviewing the emergency medical  record.   TIME SPENT:  One hour.      Renee Ramus, MD  Electronically Signed     JF/MEDQ  D:  11/02/2008  T:  11/02/2008  Job:  [id number]   cc:   Melvern Banker

## 2010-11-21 NOTE — Procedures (Signed)
NAMEBREVYN, RING                 ACCOUNT NO.:  [id number]   MEDICAL RECORD NO.:  [id number]          PATIENT TYPE:  OUT   LOCATION:  SLEEP CENTER                 FACILITY:  [facility]   PHYSICIAN:  Clinton D. Maple Hudson, MD, FCCP, FACPDATE OF BIRTH:  [birth year] 13[birth year] 1977   DATE OF STUDY:  04/27/2008                            NOCTURNAL POLYSOMNOGRAM   REFERRING PHYSICIAN:  Eustaquio Boyden, MD   REFERRING PHYSICIAN:  Eustaquio Boyden, MD   INDICATION FOR STUDY:  Insomnia with sleep apnea.   EPWORTH SLEEPINESS SCORE:  Epworth sleepiness score 15/24.  BMI 24.4.  Weight 175 pounds. Height 71 inches.  Neck 15 inches.   HOME MEDICATIONS:  Charted and reviewed.   SLEEP ARCHITECTURE:  Total sleep time 280 minutes with sleep efficiency  76.7%.  Stage I was 7.8%.  Stage II 89.7%.  Stage III 0.2%.  REM 2.3% of  total sleep time.  Sleep latency is 30 minutes.  REM latency 327  minutes.  Awake after sleep onset 55 minutes.  Arousal index 18.   BEDTIME MEDICATION:  Indwelling insulin pump.  No other medications  reported.   RESPIRATORY DATA:  Apnea-hypopnea index (AHI) 0 per hour.  No events,  meetings, scoring criteria were identified.   OXYGEN DATA:  Mild-to-moderate snoring with oxygen desaturation to a  nadir of 94%.  Mean oxygen saturation through the study 97.6% on room  air.   CARDIAC DATA:  Sinus rhythm with occasional PAC and PVC.   MOVEMENT/PARASOMNIA:  No significant movement disturbance.  No bathroom  trips.   IMPRESSIONS/RECOMMENDATIONS:  1. Sleep architecture is significant for frequent arousals with brief      awakenings through the first 2 hours of the study, nonspecific.  No      significant respiratory disturbance, AHI 0 per hour, mild-to-      moderate snoring with      oxygen desaturation to a nadir of 94%.  2. No specific events associated with sleep disturbance. Consider      managing as insomnia.      Clinton D. Maple Hudson, MD, [facility], FACP  Diplomate, Biomedical engineer of Sleep  Medicine  Electronically Signed     CDY/MEDQ  D:  05/01/2008 11:12:04  T:  05/02/2008 00:02:17  Job:  [id number]

## 2011-01-01 ENCOUNTER — Inpatient Hospital Stay (HOSPITAL_COMMUNITY)
Admission: RE | Admit: 2011-01-01 | Discharge: 2011-01-10 | DRG: 885 | Disposition: A | Discharge status: Home / Self Care | Payer: PRIVATE HEALTH INSURANCE | Attending: Psychiatry | Admitting: Psychiatry

## 2011-01-01 DIAGNOSIS — I10 Essential (primary) hypertension: Secondary | ICD-10-CM

## 2011-01-01 DIAGNOSIS — Z794 Long term (current) use of insulin: Secondary | ICD-10-CM

## 2011-01-01 DIAGNOSIS — F332 Major depressive disorder, recurrent severe without psychotic features: Principal | ICD-10-CM

## 2011-01-01 DIAGNOSIS — A059 Bacterial foodborne intoxication, unspecified: Secondary | ICD-10-CM

## 2011-01-01 DIAGNOSIS — F6381 Intermittent explosive disorder: Secondary | ICD-10-CM

## 2011-01-01 DIAGNOSIS — A088 Other specified intestinal infections: Secondary | ICD-10-CM

## 2011-01-01 DIAGNOSIS — Z560 Unemployment, unspecified: Secondary | ICD-10-CM

## 2011-01-01 DIAGNOSIS — IMO0002 Reserved for concepts with insufficient information to code with codable children: Secondary | ICD-10-CM

## 2011-01-01 DIAGNOSIS — E1065 Type 1 diabetes mellitus with hyperglycemia: Secondary | ICD-10-CM

## 2011-01-01 LAB — GLUCOSE, CAPILLARY: Glucose-Capillary: 309 mg/dL — ABNORMAL HIGH (ref 70–99)

## 2011-01-02 ENCOUNTER — Ambulatory Visit (HOSPITAL_COMMUNITY)
Admission: EM | Admit: 2011-01-02 | Discharge: 2011-01-02 | Disposition: A | Discharge status: Home / Self Care | Payer: Self-pay | Source: Ambulatory Visit | Attending: Emergency Medicine | Admitting: Emergency Medicine

## 2011-01-02 DIAGNOSIS — E860 Dehydration: Secondary | ICD-10-CM | POA: Insufficient documentation

## 2011-01-02 DIAGNOSIS — E119 Type 2 diabetes mellitus without complications: Secondary | ICD-10-CM | POA: Insufficient documentation

## 2011-01-02 DIAGNOSIS — F6381 Intermittent explosive disorder: Secondary | ICD-10-CM

## 2011-01-02 DIAGNOSIS — R5383 Other fatigue: Secondary | ICD-10-CM | POA: Insufficient documentation

## 2011-01-02 DIAGNOSIS — Z794 Long term (current) use of insulin: Secondary | ICD-10-CM | POA: Insufficient documentation

## 2011-01-02 DIAGNOSIS — F329 Major depressive disorder, single episode, unspecified: Secondary | ICD-10-CM | POA: Insufficient documentation

## 2011-01-02 DIAGNOSIS — F3289 Other specified depressive episodes: Secondary | ICD-10-CM | POA: Insufficient documentation

## 2011-01-02 DIAGNOSIS — R5381 Other malaise: Secondary | ICD-10-CM | POA: Insufficient documentation

## 2011-01-02 DIAGNOSIS — F339 Major depressive disorder, recurrent, unspecified: Secondary | ICD-10-CM

## 2011-01-02 LAB — COMPREHENSIVE METABOLIC PANEL
ALT: 11 U/L (ref 0–53)
ALT: 14 U/L (ref 0–53)
AST: 16 U/L (ref 0–37)
AST: 19 U/L (ref 0–37)
Alkaline Phosphatase: 63 U/L (ref 39–117)
CO2: 28 mEq/L (ref 19–32)
CO2: 30 mEq/L (ref 19–32)
Calcium: 9.6 mg/dL (ref 8.4–10.5)
Chloride: 101 mEq/L (ref 96–112)
Chloride: 95 mEq/L — ABNORMAL LOW (ref 96–112)
Creatinine, Ser: 1.06 mg/dL (ref 0.50–1.35)
GFR calc Af Amer: >60 mL/min (ref >60)
GFR calc non Af Amer: >60 mL/min (ref >60)
GFR calc non Af Amer: >60 mL/min (ref >60)
Glucose, Bld: 236 mg/dL — ABNORMAL HIGH (ref 70–99)
Potassium: 4.1 mEq/L (ref 3.5–5.1)
Sodium: 136 mEq/L (ref 135–145)
Sodium: 131 mEq/L — ABNORMAL LOW (ref 135–145)
Total Bilirubin: 0.6 mg/dL (ref 0.3–1.2)

## 2011-01-02 LAB — DIFFERENTIAL
Basophils Absolute: 0 10*3/uL (ref 0.0–0.1)
Basophils Relative: 1 % (ref 0–1)
Eosinophils Absolute: 0.3 10*3/uL (ref 0.0–0.7)
Eosinophils Absolute: 0.3 10*3/uL (ref 0.0–0.7)
Lymphs Abs: 3.2 10*3/uL (ref 0.7–4.0)
Monocytes Relative: 5 % (ref 3–12)
Neutro Abs: 2.3 10*3/uL (ref 1.7–7.7)
Neutro Abs: 2.2 10*3/uL (ref 1.7–7.7)
Neutrophils Relative %: 37 % — ABNORMAL LOW (ref 43–77)
Neutrophils Relative %: 48 % (ref 43–77)

## 2011-01-02 LAB — CBC
Hemoglobin: 14.9 g/dL (ref 13.0–17.0)
MCH: 28 pg (ref 26.0–34.0)
MCH: 28.2 pg (ref 26.0–34.0)
MCV: 83.1 fL (ref 78.0–100.0)
Platelets: 212 10*3/uL (ref 150–400)
Platelets: 207 10*3/uL (ref 150–400)
RBC: 5.33 MIL/uL (ref 4.22–5.81)
RBC: 5.17 MIL/uL (ref 4.22–5.81)
WBC: 6.1 10*3/uL (ref 4.0–10.5)
WBC: 4.7 10*3/uL (ref 4.0–10.5)

## 2011-01-02 LAB — URINALYSIS, ROUTINE W REFLEX MICROSCOPIC
Bilirubin Urine: NEGATIVE
Ketones, ur: NEGATIVE mg/dL
Specific Gravity, Urine: 1.034 — ABNORMAL HIGH (ref 1.005–1.030)
Urobilinogen, UA: 0.2 mg/dL (ref 0.0–1.0)

## 2011-01-02 LAB — GLUCOSE, CAPILLARY
Glucose-Capillary: 567 mg/dL (ref 70–99)
Glucose-Capillary: 474 mg/dL — ABNORMAL HIGH (ref 70–99)
Glucose-Capillary: 342 mg/dL — ABNORMAL HIGH (ref 70–99)

## 2011-01-02 LAB — URINE MICROSCOPIC-ADD ON

## 2011-01-03 LAB — GLUCOSE, CAPILLARY
Glucose-Capillary: 177 mg/dL — ABNORMAL HIGH (ref 70–99)
Glucose-Capillary: 163 mg/dL — ABNORMAL HIGH (ref 70–99)

## 2011-01-03 NOTE — H&P (Signed)
[name], MACAULEY                 ACCOUNT NO.:  [id number]  MEDICAL RECORD NO.:  [id number]  LOCATION:  0501                          FACILITY:  [facility]  PHYSICIAN:  Franchot Gallo, MD     DATE OF BIRTH:  [birth year]  DATE OF ADMISSION:  01/01/2011 DATE OF DISCHARGE:  01/02/2011                      PSYCHIATRIC ADMISSION ASSESSMENT   CHIEF COMPLAINT:  "I need help for my depression and my temper problems."  HISTORY OF PRESENT ILLNESS:  [name] is a 35 year old single black male who was admitted to Behavioral Health for evaluation of longstanding depressive symptoms as well as temper issues.  The patient states that he has been depressed for quite some time and states that he is having significant difficulty initiating and maintaining sleep.  He reports a good appetite but describes his depression as severe.  On a scale of 1-10, the patient rates his depression as an 8 with severe feelings of sadness, anhedonia and depressed mood.  He states that for several days prior to admission he was experiencing suicidal ideations but reports that now that he is admitted, he is having no thoughts of harming himself.  He does report one past suicide gesture where he overdosed with insulin approximately 3 years ago.  The patient denies any past current auditory or visual hallucinations, nor does he report any delusional thinking.  He also states that he has not experienced any prolonged manic or hypomanic symptoms but states that he feels that he may be "bipolar."  He states he bases this on his quick temper.  The patient denies any use of tobacco products, alcohol, or illicit drugs.  He presents today for evaluation of the above symptoms as well as treatment recommendations.  PAST PSYCHIATRIC HISTORY:  The patient reports that he has been seen in the past at [facility].  He reports being placed on Zoloft at that time which he states did not help for his depression.   He also reports being placed on Geodon which caused "muscle spasms."  PAST MEDICAL HISTORY:  Current medications: 1. Lantus 30 units at bedtime. 2. NovoLog p.r.n.  Allergies:  No known drug allergies.  Medical illnesses: 1.  Insulin-dependent diabetes mellitus diagnosed at age 47 years. 2.  History of elevated blood pressure.  Past operations: 1.  Umbilical hernia repair at the age of 5 years.  FAMILY HISTORY:  The patient states that his mother has a history of a bipolar illness as well as a history of crack cocaine abuse.  He also states that he has a son who is 84 years of age who also was recently diagnosed as having a bipolar disorder.  He denies any other family history of psychiatric or substance-related illnesses.  SOCIAL HISTORY:  The patient states that he was born in [place], [place], and currently is homeless.  He reports that his last residence was with his son's mother but reports he was evicted on December 23, 2010.  He is single and has never been married.  He has two children, a son 16 years of age and a son who is 64 years of age who are in good health.  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  He states he was terminated due to academic issues.  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  As described above, he denies any use of tobacco, alcohol or illicit drugs.  MENTAL STATUS EXAM:  General:  The patient was alert and oriented x3 and was friendly and cooperative throughout the evaluation.  Speech was appropriate in terms of rate and volume.  Mood appeared severely depressed.  Affect was moderately constricted.  Thoughts:  The patient denied any current suicidal or homicidal ideations.  He also denied any auditory or visual hallucinations or delusional thinking.  Judgment and insight today both appeared fair to good.  IMPRESSION:   AXIS I:  Major  depressive disorder, recurrent, severe. AXIS II:  Deferred. AXIS III: 1.  Insulin-dependent diabetes mellitus diagnosed at age 7 years. 2.  History of elevated blood pressure. 3. Status post umbilical hernia repair at the age of 5 years. AXIS IV:  Unemployment, financial constraints, limited primary support system, homelessness.  Chronic serious nonpsychiatric health issues. AXIS V:  Global Assessment of Functioning at time of admission approximately 40.  Highest Global Assessment of Functioning in past year approximately 65.  PLAN: 1. The patient was started on the medication Celexa 20 mg p.m. q.a.m.     to begin to address his depressive symptoms. 2. The patient was started on the medication Abilify at 2 mg p.o. at     bedtime to attempt to provide mood stabilization and to address his     temper issues.  This medication will likely be increased but was     started at a low dose secondary to the patient's previous reaction     to Geodon. 3. The diabetic nurse educator met with the patient and will make     recommendations concerning his insulin-dependent diabetes mellitus. 4. The patient will continue to be monitored for dangerousness to self     and/or others. 5. The patient will participate in unit and group activities per     routine.    __________________________________ Franchot Gallo, MD     RR/MEDQ  D:  01/02/2011  T:  01/03/2011  Job:  [id number]  Electronically Signed by Franchot Gallo MD on 01/03/2011 11:33:48 AM

## 2011-01-04 LAB — BASIC METABOLIC PANEL
BUN: 9 mg/dL (ref 6–23)
Chloride: 103 mEq/L (ref 96–112)
GFR calc Af Amer: >60 mL/min (ref >60)
Glucose, Bld: 107 mg/dL — ABNORMAL HIGH (ref 70–99)
Potassium: 3.7 mEq/L (ref 3.5–5.1)

## 2011-01-04 LAB — GLUCOSE, CAPILLARY
Glucose-Capillary: 41 mg/dL — CL (ref 70–99)
Glucose-Capillary: 149 mg/dL — ABNORMAL HIGH (ref 70–99)

## 2011-01-05 LAB — GLUCOSE, CAPILLARY
Glucose-Capillary: 78 mg/dL (ref 70–99)
Glucose-Capillary: 245 mg/dL — ABNORMAL HIGH (ref 70–99)
Glucose-Capillary: 351 mg/dL — ABNORMAL HIGH (ref 70–99)

## 2011-01-06 LAB — GLUCOSE, CAPILLARY
Glucose-Capillary: 443 mg/dL — ABNORMAL HIGH (ref 70–99)
Glucose-Capillary: 145 mg/dL — ABNORMAL HIGH (ref 70–99)
Glucose-Capillary: 127 mg/dL — ABNORMAL HIGH (ref 70–99)

## 2011-01-06 LAB — VALPROIC ACID LEVEL: Valproic Acid Lvl: 52.3 ug/mL (ref 50.0–100.0)

## 2011-01-07 DIAGNOSIS — F6381 Intermittent explosive disorder: Secondary | ICD-10-CM

## 2011-01-07 DIAGNOSIS — F339 Major depressive disorder, recurrent, unspecified: Secondary | ICD-10-CM

## 2011-01-07 LAB — CBC
MCH: 28.5 pg (ref 26.0–34.0)
MCV: 82.1 fL (ref 78.0–100.0)
Platelets: 234 10*3/uL (ref 150–400)
RBC: 5.69 MIL/uL (ref 4.22–5.81)
RDW: 12.4 % (ref 11.5–15.5)

## 2011-01-07 LAB — DIFFERENTIAL
Eosinophils Absolute: 0.3 10*3/uL (ref 0.0–0.7)
Eosinophils Relative: 5 % (ref 0–5)
Lymphs Abs: 2.8 10*3/uL (ref 0.7–4.0)
Monocytes Absolute: 0.3 10*3/uL (ref 0.1–1.0)
Monocytes Relative: 6 % (ref 3–12)

## 2011-01-07 LAB — GLUCOSE, CAPILLARY
Glucose-Capillary: 273 mg/dL — ABNORMAL HIGH (ref 70–99)
Glucose-Capillary: 113 mg/dL — ABNORMAL HIGH (ref 70–99)
Glucose-Capillary: 40 mg/dL — CL (ref 70–99)

## 2011-01-07 LAB — COMPREHENSIVE METABOLIC PANEL
AST: 21 U/L (ref 0–37)
CO2: 31 mEq/L (ref 19–32)
Calcium: 9.5 mg/dL (ref 8.4–10.5)
Creatinine, Ser: 1.19 mg/dL (ref 0.50–1.35)
GFR calc Af Amer: >60 mL/min (ref >60)
GFR calc non Af Amer: >60 mL/min (ref >60)

## 2011-01-08 LAB — GLUCOSE, CAPILLARY
Glucose-Capillary: 31 mg/dL — CL (ref 70–99)
Glucose-Capillary: 250 mg/dL — ABNORMAL HIGH (ref 70–99)
Glucose-Capillary: 369 mg/dL — ABNORMAL HIGH (ref 70–99)
Glucose-Capillary: 171 mg/dL — ABNORMAL HIGH (ref 70–99)
Glucose-Capillary: 45 mg/dL — ABNORMAL LOW (ref 70–99)

## 2011-01-09 LAB — GLUCOSE, CAPILLARY: Glucose-Capillary: 162 mg/dL — ABNORMAL HIGH (ref 70–99)

## 2011-01-10 LAB — GLUCOSE, CAPILLARY: Glucose-Capillary: 274 mg/dL — ABNORMAL HIGH (ref 70–99)

## 2011-01-11 NOTE — Discharge Summary (Signed)
[name], [name]                 ACCOUNT NO.:  [id number]  MEDICAL RECORD NO.:  [id number]  LOCATION:  0505                          FACILITY:  [facility]  PHYSICIAN:  Franchot Gallo, MD     DATE OF BIRTH:  Feb 08, 1976  DATE OF ADMISSION:  01/01/2011 DATE OF DISCHARGE:  01/10/2011                              DISCHARGE SUMMARY   REASON FOR ADMISSION:  The patient is a 35 year old single black male who was admitted to Behavioral Health for evaluation of longstanding depressive symptoms as well as temper issues.  The patient stated that he was having difficulty with depression for quite some time and was having difficulty initiating and maintaining sleep.  On a scale of 1-10, the patient described his depression as an 8, reporting that it was severe.  He also reported difficulties with temper.  He was admitted and started on the medication Celexa at 20 mg p.o. q.a.m. as well as Abilify 2 mg p.o. at bedtime to provide mood stabilization.  The diabetic nurse educator was also asked to meet with the patient to help manage his diabetes during the patient's hospitalization.  SUMMARY OF THE HOSPITALIZATION:  As stated above, the patient was initially seen by this provider on January 02, 2011 and was started on the medication Celexa 20 mg p.o. q.a.m. to address his depressive symptoms as well as Abilify 2 mg p.o. q.h.s. to address his temper issues and to provide mood stabilization.  At that time, the patient reported that he was having difficulty sleeping but reported a good appetite.  He reported his depression as severe and on a scale of 1-10 as an 8.  He denied any suicidal ideation but stated that these were present prior to admission.  He denied any homicidal ideations and denied any auditory or visual hallucinations or delusional thinking.  He denied any substance- abuse-related issues.  He was given the diagnosis of a major depressive disorder - recurrent - severe as well as an intermittent  explosive disorder.  The patient was next seen by this provider on January 03, 2011 and stated that he was having some difficulty sleeping secondary to his blood sugars.  He reported a fair appetite and reported moderate depression. He denied any suicidal or homicidal ideations and stated that he did experience some auditory hallucinations the day prior when "upset."  He stated his anxiety symptoms were under fair to good control.  He was continued on his current medications.  The patient was next seen by this provider on January 04, 2011 and stated that he woke up at 2:00 a.m. to 3:00 a.m. and could not sleep afterwards.  He reported a good appetite and reported again moderate feelings of sadness, anhedonia, and depressed mood, stating his depression on a scale of 1-10 was a 4.  He denied any suicidal or homicidal ideations or delusional thinking and denied any auditory or visual hallucinations.  He stated no anxiety symptoms.  He was started on Depakote ER at 500 mg p.o. nightly as a mood stabilizer, and his medication, Celexa, was increased to 30 mg p.o. q.a.m. for depression.  The patient was next seen by this provider on January 05, 2011 and reported that he slept well and reported his appetite was good.  He stated that his depression was mild and rated it on a 1-10 scale as a 3.  He denied any suicidal or homicidal ideations as well as any auditory or visual hallucinations or delusional thinking.  However, the patient began to report racing thoughts during the day as well as concerns about "erectile dysfunction."  He stated his anxiety symptoms were severe. His medication temperature was increased to 500 mg p.o. q.a.m. and nightly.  The patient was next seen by the on-call providers on January 06, 2011 and reported he was having some racing thoughts but that they were decreased.  Mood changes were made to his medications.  The patient was next seen by the on-call treatment team on January 07, 2011 and stated that he was sleeping okay and reported good appetite.  He stated his depression on a scale of 1-10 was a 2-3.  He stated that his racing thoughts had stopped, and he denied any auditory or visual hallucinations or delusional thinking.  He also denied any suicidal or homicidal ideations.  His serum Depakote level was 52.3.  The patient was next seen by this provider on January 08, 2011 and reported that he was sleeping well without difficulty.  He reported a good appetite and stated that he was experiencing some nausea and emesis over the weekend.  He denied any suicidal or homicidal ideations and stated that his depressive symptoms were resolved and on scale of 1-10 with a 0.  He also stated that his anxiety symptoms were resolved.  He stated that he still felt that his anger may be an issue.  The patient was next seen by this provider on January 09, 2011 and reported that he was sleeping good without difficulty and reported a good appetite.  He stated that he was experiencing some depressive symptoms today and felt his depression was moderate secondary to his son not being able to visit the night before, as having promised.  He continued to deny any suicidal or homicidal ideations as well as any auditory or visual hallucinations or delusional thinking.  He stated that his anxiety symptoms were mild.  He stated that he had racing thoughts were totally resolved.  The patient's medication Celexa was increased to 40 mg p.o. q.a.m. to further address his depressive symptoms.  The patient was next seen by this provider on January 10, 2011 and stated that he felt ready for discharge.  He stated that he was sleeping well without difficulty and reported a good appetite and stated that his depression had resolved.  He rated his depression on a scale of 1-10 as 0.  He adamantly denied any suicidal or homicidal ideations as well as any auditory or visual hallucinations or delusional  thinking.  He rated his anxiety as essentially resolved but on a 1-10 was a 1.  He denied any medication-related side effects. And his request for discharge was ordered.  SIGNIFICANT LABORATORY:  His serum Depakote level on January 06, 2011 was 52.3.  Also during the admission, the patient's blood sugars fluctuated and were carefully monitored by both the diabetic nurse educator and the consultant endocrinologist.  DISCHARGE MEDICATIONS: 1. Aripiprazole 2 mg p.o. nightly. 2. Celexa 40 mg q.a.m. 3. Depakote ER 500 mg p.o. q.a.m. and nightly. 4. NovoLog 1-2 units subcu at bedtime. 5. NovoLog 1-6 units subcu 3 times a day with meals. 6. NovoLog 4 mg subcutaneously 3 tablets a day  with pills. 7. Lantus 22 units at bedtime. 8. Lisinopril 5 mg p.o. q.a.m.  DISCHARGE DIAGNOSIS:  AXIS I: 1. Major depressive disorder - recurrent - currently under good     control. 2. Intermittent explosive disorder.  AXIS II:  Deferred.  AXIS III: 1. Insulin dependent diabetes mellitus. 2..  History of elevated blood pressure. 1. Status post umbilical hernia repair at age 50 years.  AXIS IV: 1. Unemployment. 2. Financial constraints. 3. Limited primary support system. 4. Serious psychiatric health issues.  AXIS V:  GAF at the time of admission approximately 40.  The highest GAF in the past year approximately 53.  GAF at the time of discharge approximately 70.  CONDITION ON DISCHARGE:  The patient was alert and oriented x3 and was friendly and cooperative with the provider.  Speech was appropriate in terms of rate and volume.  Mood appeared euthymic.  Affect was bright and full.  Thoughts - the patient denied any obvious delusions or hallucinations nor did he report any suicidal or homicidal ideations. Judgment and insight both appeared fair to good.  The patient's anxiety symptoms were minimal, and he denied any medication-related side effects.  FOLLOWUP INSTRUCTIONS:  The patient will follow up  with [facility] on January 11, 2011 between 8:00 a.m. and 11:00 a.m..  He will also follow-up with his endocrinologist as scheduled.    __________________________________ Franchot Gallo, MD     RR/MEDQ  D:  01/10/2011  T:  01/10/2011  Job:  [id number]  Electronically Signed by Franchot Gallo MD on 01/11/2011 06:38:36 PM

## 2011-01-29 NOTE — Consult Note (Signed)
[name], [name]                 ACCOUNT NO.:  [id number]  MEDICAL RECORD NO.:  [id number]  LOCATION:  0505                          FACILITY:  [facility]  PHYSICIAN:  [name], MD     DATE OF BIRTH:  1976/02/16  DATE OF CONSULTATION:  01/07/2011 DATE OF DISCHARGE:                                CONSULTATION   This is a followup medical consultation note.  The patient did not have any complaints when seen at approximately 7:00 a.m. this morning.  However, this dictator received a call later in the afternoon by the psychiatric nursing indicating that the patient has had three episodes of nausea and vomiting with no associated blood or coffee grounds or abdominal pain or fevers and chills.  Per nursing there are other patients at the [facility] with history of gastric upset.  PHYSICAL EXAMINATION:  The patient had not been in any distress. Temperature 98.2 degrees Fahrenheit, respiration 16 per minute, blood pressure 151/82 millimeters of mercury, pulse 64 per minute regular. CBGs since this morning have been 193 mg/dL fasting and then 045 mg/dL 2- hour postprandial.  He has had no hypoglycemic episodes over 24 hours. RESPIRATORY SYSTEM:  Clear. CARDIOVASCULAR SYSTEM:  First and second heart sounds heard, regular. ABDOMEN:  Nondistended, soft.  Bowel sounds normally heard. CENTRAL NERVOUS SYSTEM:  Patient awake, alert, oriented x3.  No focal neurological deficits.  LABORATORY DATA:  Today only significant for glucose of 197, otherwise comprehensive metabolic panel within normal limits.  CBC is within normal limits.  Valproic acid level was 52.3.  ASSESSMENT AND PLAN: 1. Uncontrolled brittle type 1 diabetes mellitus.  The patient's     control seems to be finally getting better.  Recommend the     following medications on discharge home.     a.     Lantus 26 units subcutaneously q.h.s.     b.     NovoLog 4 units subcutaneously t.i.d. with meals (meal  coverage).     c.     NovoLog sliding scale insulin t.i.d. with meals as follows.      For CBG less than 70 mg/dL ask the patient to drink or eat      something sweet and repeat his fingerstick blood sugar.  For CBG      70-150 mg/dL no insulin, 409-811 mg/dL 1 unit, 914-782 mg/dL 2      units, 956-213 mg/dL 3 units, 086-578 mg/dL 4 units, 469-629 mg/dL      5 units, greater than 400 mg/dL 6 units.     d.     NovoLog sliding scale subcutaneously q.h.s. sliding scale as      follows.  For CBG less than 70 mg/dL advise patient regarding      eating or drinking something sweet and repeating fingerstick blood      sugar, 70-200 mg/dL no insulin, 528-413 mg/dL 1 unit, 244-010      mg/dL 2 units, greater than 272 mg/dL 3 units.  These insulins      will obviously have to be adjusted as an outpatient.  The patient      indicates that he will be able to  procure his Lantus and NovoLog      insulin at [facility].  The clinical social worker or      case manager at the behavioral health center is to assist the      patient with an early followup appointment at [facility].  The patient has also been counseled repeatedly to      call Dr. Casimiro Needle Altheimer's office, the endocrinologist, for a      followup appointment. 2. Uncontrolled hypertension.  The patient indicated that he used to     take lisinopril 10 mg p.o. daily for hypertension as well as Nephro     protection.  He is unable to say why he stopped taking the     medication but denies allergy to any medications.  The patient will     be started on lisinopril 5 mg p.o. daily and this can be titrated     as an outpatient. 3. Nausea and vomiting which seem to have started since this medical     doctor saw the patient and may be possibly from some form of viral     gastroenteritis or food poisoning.  The patient is to continue     Zofran p.r.n. and is to be monitored.     [name], MD     AH/MEDQ   D:  01/07/2011  T:  01/07/2011  Job:  [id number]  Electronically Signed by [name] MD on 01/29/2011 10:47:23 AM

## 2011-01-29 NOTE — Progress Notes (Signed)
[name], [name]                 ACCOUNT NO.:  [id number]  MEDICAL RECORD NO.:  [id number]  LOCATION:  0505                          FACILITY:  BH  PHYSICIAN:  [name], MD     DATE OF BIRTH:  [birth year]                                PROGRESS NOTE   The patient denies any complaints, but does indicate that he had an episode of symptomatic hypoglycemia last night at about 8:40 p.m. when his blood sugars dropped to 37 mg/dL.  He indicates that he has been eating well.  PHYSICAL EXAMINATION: GENERAL:  The patient is ambulating the halls.  He is in no obvious distress. VITAL SIGNS:  Temperature 97.1 degrees Fahrenheit, pulse 59 per minute, respiration 18 per minute, blood pressure 145/78 mmHg with no orthostatic blood pressure changes.  His CBG is today have ranged from 125 mg/dL fasting to 161 mg/dL 2 hours post breakfast. RESPIRATORY:  Clear. CARDIOVASCULAR:  First and second heart sounds heard regular. ABDOMEN:  Nondistended, nontender, soft and bowel sounds present. CENTRAL NERVOUS SYSTEM:  The patient is awake, alert, oriented x3 with no focal neurological deficits.  ASSESSMENT AND PLAN: 1. Poorly controlled brittle type 1 diabetes.  This dictator discussed     with Dr. Talmage Coin with [facility] who recommended that     the patient is possibly insulin sensitive and recommended adjusting     his insulin doses as follows.  Reduce his Lantus to 26 units     subcutaneously q.h.s., continue NovoLog subcutaneously 4 units     t.i.d. with meals and reduce his sliding scale insulin to as     follows:  For CBG less than 70 mg/dL implement hypoglycemia     protocol from, 70-150 mg/dL, no insulin; 096-045 mg/dL, 1 unit; 409-     811 mg/dL, 2 units; 914-782 mg/dL, 3 units; 956-213 mg/dL, 4 units;     086-578 mg/dL, 5 units; and greater than 400 mg/dL, 6 units.  He     has apparently not seen Dr. Leslie Dales since September 2009.  This     insulin adjustment can be  titrated based on his blood sugars.  He     is welcome to follow-up with Dr. Leslie Dales when he is discharged     from the hospital.  The following orders were called into the     patient's nurse. 2. Pseudohyponatremia, resolved.  His basic metabolic panel today is     within normal limits except for glucose of 107. 3. Depression and temper issues, management as per psychiatrist. 4. If his blood sugars continued to be reasonably controlled with no     further episodes of hypoglycemia, he should be stable from a     medical standpoint for discharge for outpatient follow-up with his     primary care physicians and/or endocrinologist in the next day or     2.  This dictator will continue to see the patient while he is in     the hospital.     [name], MD     AH/MEDQ  D:  01/04/2011  T:  01/04/2011  Job:  [id number]  Electronically Signed by Theadora Rama  Desire Fulp MD on 01/29/2011 10:48:17 AM

## 2011-01-29 NOTE — Consult Note (Signed)
[name], [name]                 ACCOUNT NO.:  [id number]  MEDICAL RECORD NO.:  [id number]  LOCATION:  0501                          FACILITY:  [facility]  PHYSICIAN:  [name], MD     DATE OF BIRTH:  [birth year]  DATE OF CONSULTATION: DATE OF DISCHARGE:                                CONSULTATION   PRIMARY CARE PHYSICIAN:  Dr. Julieanne Manson at [facility]  REASON FOR CONSULTATION:  Evaluation and management of uncontrolled type 1 diabetes mellitus.  CHIEF COMPLAINT:  Difficult to control diabetes.  HISTORY OF PRESENT ILLNESS:  [name] is a pleasant 35 year old African American male patient with history of type 1 diabetes mellitus, question hypertension, childhood asthma (resolved), heart murmur, who indicates that he used to be on an insulin pump until a year and 3 months ago.  Since then he has been on Lantus, NovoLog mealtime and sliding scale insulin.  He verbalizes compliance to medications except on occasions when he has had difficulty procuring medications one or two times.  He checks his blood sugars at home and has problems achieving a steady control.  He indicates that his blood sugars fluctuate between 32 mg/dL to 045 mg/dL.  He volunteers to approximately two episodes of hypoglycemia mostly when awake per week.  He is admitted to the [facility] for management of depression and temper problems.  He initially came in with blood sugars of 500 mg/dL.  The patient apparently had not taken his mealtime NovoLog.  He was sent to the [facility] emergency room and was on an insulin drip for a few hours and then was transferred back to the [facility].  He got his night dose of Lantus last night and this morning had fasting blood sugar of 240 but subsequently post breakfast his blood sugar was 10.17.  He indicates that he did not eat adequately during breakfast because of mild nausea.  The triad hospitalists were requested  to evaluate him for his management of diabetes.  The patient indicates that he has polyuria and polydipsia when his blood sugars are high.  He denies any blurred vision or pruritus or numbness or tingling of his feet.  The patient is frustrated because of the difficulty in controlling his diabetes.  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  He has been evicted from his house and indicates that he is currently living with his son's mother.  He has seen endocrinology in the early part of 2011.  PAST MEDICAL HISTORY: 1. Type 1 diabetes mellitus. 2. Question history of hypertension and has been on meds in the past. 3. Asthma. 4. Murmur.  PAST SURGICAL HISTORY:  Umbilical hernia surgery at age 89 years.  ALLERGIES:  GEODON.  HOME MEDICATIONS: 1. Lantus 30 units subcutaneously q.h.s. 2. NovoLog t.i.d. with meals scheduled based on carbohydrate counting. 3. NovoLog sliding scale insulin t.i.d. with meals. FAMILY HISTORY:  The patient's mother has bipolar disorder and maternal uncle had insulin dependent diabetes mellitus and died from its complications.  SOCIAL HISTORY:  The patient denies any history of smoking.  He drinks alcohol occasionally and socially.  He has a remote history of substance abuse in 1999.  The patient is independent of activities of daily living.  REVIEW OF SYSTEMS:  All systems reviewed and apart from history of presenting illness is negative.  PHYSICAL EXAMINATION:  [name] is moderately built and nourished male patient who is in no obvious distress.  Temperature 97.9 degrees Fahrenheit, respiration 18 per minute, blood pressure 147/87 mmHg. HEAD, EYES, ENT:  Nontraumatic normocephalic.  Pupils equally reactive to light and accommodation.  Oral mucosa is moist with no acute findings. NECK:  Supple.  No JVD or  carotid bruit. LYMPHATICS:  No lymphadenopathy. RESPIRATORY SYSTEM:  Clear.  No increased work of breathing. CARDIOVASCULAR SYSTEM:  First and second heart sounds heard.  Regular rate and rhythm.  Short systolic murmur at the apex.  No JVD. ABDOMEN:  Nondistended, nontender, soft and normal bowel sounds heard. No organomegaly or mass appreciated.  The patient has a surgical scar on the right side of the umbilicus CENTRAL NERVOUS SYSTEM:  The patient is awake, alert, oriented x3 with no focal neurological deficits. EXTREMITIES:  Grade 5/5 power. SKIN:  Without any rashes.  LABORATORY DATA:  urinalysis with no features of urinary tract infection but showed greater than 1000 mg/dL of glucose and 161 mg/dL of protein. Comprehensive metabolic panel done yesterday showed sodium 131, chloride 95, glucose 487, otherwise within normal limits.  CBC was within normal limits.  Hemoglobin A1c was 9.8.  TSH was 0.601.  Blood alcohol level was less than 11.  ASSESSMENT AND PLAN: 1. Uncontrolled/question brittle type 1 diabetes mellitus.  Will     continue with the patient's home dose of Lantus 30 units     subcutaneously q.h.s.  We will increase his Lantus to 4 units     t.i.d. with meals and use sensitive sliding scale insulin t.i.d.     with meals and bedtime scale.  The patient has been advised to eat     appropriately and inform the nurses if he has eaten less than the     usual so his mealtime insulins can be reduced.  Will discuss with     an endocrinologist in the a.m. and seek assistance regarding his     fluctuating and brittle diabetes. 2. Pseudohyponatremia, secondary to uncontrolled hyperglycemia.  Will     check basic metabolic panel in the a.m. 3. Depression and temper issues, management per the psychiatrist. 4. Question history of hypertension.  May consider adding an     angiotensin-converting enzyme inhibitor or an angiotensin receptor     blocker.  Thank you for allowing [place] to  participate in Mr. Borawski care.  We will continue to see him during the hospitalization at the [facility].  Time taken in coordinating this consultation is 1 hour.     [name], MD     AH/MEDQ  D:  01/03/2011  T:  01/03/2011  Job:  [id number]  cc:   Marcene Duos, M.D.  Franchot Gallo, MD  Electronically Signed by [name] MD on 01/29/2011 10:44:44 AM

## 2011-01-29 NOTE — Progress Notes (Signed)
  [name], [name]                 ACCOUNT NO.:  [id number]  MEDICAL RECORD NO.:  [id number]  LOCATION:  0505                          FACILITY:  [facility]  PHYSICIAN:  [name], MD     DATE OF BIRTH:  May 31, 1976                                PROGRESS NOTE   Medicine Consultation Followup Note.  COMPLAINT TODAY: The patient indicates that he had a blood sugar of 53 mg/dL this morning.  He also volunteered that in his experience, whenever he has taken more than 3 units of NovoLog at bedtime, he has experienced hypoglycemia in the mornings.  PHYSICAL EXAMINATION: GENERAL:  The patient is in no obvious distress.  He is ambulating in the halls. VITAL SIGNS:  Temperature is 97.7 degrees Fahrenheit.  Respiratory rate is 20 per minute.  Blood pressure is 162/87 mmHg with no orthostatic changes, and pulse is 56 per minute. RESPIRATORY SYSTEM:  Clear. CARDIOVASCULAR SYSTEM:  First and second heart sounds are regular. ABDOMEN:  Nondistended, nontender, soft.  Bowel sounds present. CENTRAL NERVOUS SYSTEM:  The patient is awake, alert, oriented x3 with no focal neurological deficits.  His CBG was 53 mg/dL fasting at 1:61 this morning and since then, at 9:14 a.m., it was 443 mg/dL at which time, he got 6 units of NovoLog insulin and subsequent blood sugars at 11:48 a.m. were 145 mg/dL.  ASSESSMENT AND PLAN.: 1. Uncontrolled brittle type 1 diabetes mellitus.  We will continue     the same dose of Lantus and mealtime NovoLog insulins.  We will cut     back on the bedtime sliding-scale insulin doses.  We will continue     to monitor. 2. Hypertension.  We may need to initiate ACE inhibitors or ARBs.  DISPOSITION: The patient will need close followup with his primary care physician or endocrinologist once he is discharged from the behavioral health center. The day of discharge from the behavioral health center is still unclear.     [name], MD     AH/MEDQ  D:  01/06/2011   T:  01/06/2011  Job:  [id number]  Electronically Signed by [name] MD on 01/29/2011 10:49:18 AM

## 2011-01-29 NOTE — Progress Notes (Signed)
  [name], [name]                 ACCOUNT NO.:  [id number]  MEDICAL RECORD NO.:  [id number]  LOCATION:  0505                          FACILITY:  [facility]  PHYSICIAN:  [name], MD     DATE OF BIRTH:  1976-04-14                                PROGRESS NOTE   This is a follow up medical consultation note.  The patient denies any complaints.  REVIEW OF SYSTEMS: Negative.  He did have a hypoglycemic episode at about 5 p.m. yesterday when his CBG was 41 mg/dL.  PHYSICAL EXAMINATION: GENERAL:  The patient is in no distress. VITAL SIGNS:  Temperature 97.7 degrees Fahrenheit, pulse is 63 per minute, respiration 20 per minute, blood pressure 129/92 mmHg. RESPIRATORY:  System clear. CARDIOVASCULAR:  First and second heart sounds heard regular. ABDOMEN:  Nondistended, nontender and soft.  Bowel sounds are present. CENTRAL NERVOUS SYSTEM:  Patient awake, alert, oriented x3 with no focal neurological deficits.  LABORATORY DATA: CBGs today were 78 mg/dL at 9:56 a.m. which was possibly fasting; 225 at 8:26 a.m.; 243 at 11:10 a.m.; 214 at 12:03 p.m.  It looks like the patient did not get his scheduled dose of mealtime NovoLog at breakfast.  ASSESSMENT/PLAN: Uncontrolled brittle type 1 diabetes mellitus.  As per yesterday's note, his Lantus was reduced to 26 units at bedtime and adjustments were made to his mealtime NovoLog and sliding-scale insulin.  Today would be the true reflection of the changes.  We will continue to monitor and make necessary adjustments to his insulins.  A clinical social work consultation has been requested to obtain an early follow up appointment at [facility].  Patient may also be able to fill up his medications at [facility].     [name], MD     AH/MEDQ  D:  01/05/2011  T:  01/05/2011  Job:  [id number]  Electronically Signed by [name] MD on 01/29/2011 10:48:50 AM

## 2011-04-05 LAB — DIFFERENTIAL
Eosinophils Relative: 3
Lymphocytes Relative: 34
Lymphs Abs: 3.2
Monocytes Absolute: 0.7

## 2011-04-05 LAB — CBC
HCT: 43
Hemoglobin: 14.7
MCV: 84.8
RDW: 13
WBC: 9.5

## 2011-04-05 LAB — POCT I-STAT, CHEM 8
BUN: 12
Calcium, Ion: 1.16
Chloride: 110
Creatinine, Ser: 1.3
Glucose, Bld: 26 — CL
HCT: 44
Hemoglobin: 15
Potassium: 4.2
Sodium: 141
TCO2: 26

## 2011-04-09 LAB — POCT I-STAT, CHEM 8
BUN: 16
Calcium, Ion: 1.19
Creatinine, Ser: 1.4
Glucose, Bld: 225 — ABNORMAL HIGH
TCO2: 23

## 2011-09-15 ENCOUNTER — Encounter (HOSPITAL_COMMUNITY): Payer: Self-pay | Admitting: Emergency Medicine

## 2011-09-15 ENCOUNTER — Emergency Department (HOSPITAL_COMMUNITY)
Admission: EM | Admit: 2011-09-15 | Discharge: 2011-09-15 | Disposition: A | Discharge status: Home / Self Care | Payer: Self-pay | Attending: Emergency Medicine | Admitting: Emergency Medicine

## 2011-09-15 DIAGNOSIS — Z794 Long term (current) use of insulin: Secondary | ICD-10-CM | POA: Insufficient documentation

## 2011-09-15 DIAGNOSIS — E162 Hypoglycemia, unspecified: Secondary | ICD-10-CM

## 2011-09-15 DIAGNOSIS — R42 Dizziness and giddiness: Secondary | ICD-10-CM | POA: Insufficient documentation

## 2011-09-15 DIAGNOSIS — E119 Type 2 diabetes mellitus without complications: Secondary | ICD-10-CM | POA: Insufficient documentation

## 2011-09-15 HISTORY — DX: Unspecified convulsions: R56.9

## 2011-09-15 NOTE — ED Notes (Signed)
Pt was shopping at [employer].  Bystanders witnessed pt almost fall, was helped to a chair.  Pt is diabetic.  Pt was given soda and cliff bar.  Pt is active, last meal was 0800, took novalog 2 units at 1200.

## 2011-09-15 NOTE — ED Provider Notes (Signed)
History     CSN: [id number]  Arrival date & time 09/15/11  1653   First MD Initiated Contact with Patient 09/15/11 1811      Chief Complaint  Patient presents with  . Hypoglycemia    (Consider location/radiation/quality/duration/timing/severity/associated sxs/prior treatment) HPI The patient presents following an episode of hypoglycemia.  He is insulin-dependent, and notes that just prior to arrival he had an episode of lightheadedness, lower himself to the ground.  He checked his blood glucose and found it to be in the 20s.  EMS notes that the patient was provided glucagon and became much more awake and alert.  On arrival the patient is awake, alert, answering my questions appropriately.  He denies any pain.  He notes that he took 2 units of his insulin approximately 5 hours ago, but did not eat anything afterwards.  He notes that he generally been in his usual state of health, and denies any focal complaints on arrival. Past Medical History  Diagnosis Date  . Diabetes mellitus   . Seizures     Past Surgical History  Procedure Date  . Hernia repair     No family history on file.  History  Substance Use Topics  . Smoking status: Not on file  . Smokeless tobacco: Not on file  . Alcohol Use:       Review of Systems  All other systems reviewed and are negative.    Allergies  Ziprasidone mesylate  Home Medications   Current Outpatient Rx  Name Route Sig Dispense Refill  . ARIPIPRAZOLE 5 MG PO TABS Oral Take 7.5 mg by mouth every morning.    Marland Kitchen DIVALPROEX SODIUM ER 500 MG PO TB24 Oral Take 1,500 mg by mouth at bedtime.    Marland Kitchen ESCITALOPRAM OXALATE 10 MG PO TABS Oral Take 10 mg by mouth every morning.    . INSULIN ASPART 100 UNIT/ML [place] SOLN Subcutaneous Inject 4 Units into the skin 3 (three) times daily before meals. Sliding scale as directed. Pt states if his blood sugar is >170 add 2 units.    . INSULIN GLARGINE 100 UNIT/ML [place] SOLN Subcutaneous Inject 25 Units into the  skin at bedtime.    Marland Kitchen LISINOPRIL 10 MG PO TABS Oral Take 10 mg by mouth every morning.    Marland Kitchen PRAVASTATIN SODIUM 10 MG PO TABS Oral Take 10 mg by mouth at bedtime.    . TRAZODONE HCL 100 MG PO TABS Oral Take 200 mg by mouth at bedtime.      BP 119/65  Pulse 56  Temp(Src) 98 F (36.7 C) (Oral)  Resp 16  SpO2 100%  Physical Exam  Nursing note and vitals reviewed. Constitutional: He is oriented to person, place, and time. He appears well-developed. No distress.  HENT:  Head: Normocephalic and atraumatic.  Eyes: Conjunctivae and EOM are normal.  Cardiovascular: Normal rate and regular rhythm.   Pulmonary/Chest: Effort normal. No stridor. No respiratory distress.  Abdominal: He exhibits no distension.  Musculoskeletal: He exhibits no edema.  Neurological: He is alert and oriented to person, place, and time.  Skin: Skin is warm and dry.  Psychiatric: He has a normal mood and affect.    ED Course  Procedures (including critical care time)  Labs Reviewed  GLUCOSE, CAPILLARY - Abnormal; Notable for the following:    Glucose-Capillary 175 (*)    All other components within normal limits   No results found.   1. Hypoglycemia       MDM  This  insulin-dependent diabetic male presents following an episode of hypoglycemia.  On my exam the patient is awake, alert, appropriately interactive.  He denies any complaints and has unremarkable vital signs and euglycemia.  The patient was observed this facility for 2 hours and discharged in stable condition.    Gerhard Munch, MD 09/15/11 [phone]

## 2011-09-15 NOTE — Discharge Instructions (Signed)

## 2013-12-06 ENCOUNTER — Emergency Department (HOSPITAL_COMMUNITY)
Admission: EM | Admit: 2013-12-06 | Discharge: 2013-12-07 | Disposition: A | Discharge status: Home / Self Care | Payer: PRIVATE HEALTH INSURANCE | Attending: Emergency Medicine | Admitting: Emergency Medicine

## 2013-12-06 ENCOUNTER — Encounter (HOSPITAL_COMMUNITY): Payer: Self-pay | Admitting: Emergency Medicine

## 2013-12-06 ENCOUNTER — Emergency Department (HOSPITAL_COMMUNITY): Payer: PRIVATE HEALTH INSURANCE

## 2013-12-06 DIAGNOSIS — I10 Essential (primary) hypertension: Secondary | ICD-10-CM | POA: Insufficient documentation

## 2013-12-06 DIAGNOSIS — Z794 Long term (current) use of insulin: Secondary | ICD-10-CM | POA: Insufficient documentation

## 2013-12-06 DIAGNOSIS — R569 Unspecified convulsions: Secondary | ICD-10-CM

## 2013-12-06 DIAGNOSIS — Z79899 Other long term (current) drug therapy: Secondary | ICD-10-CM | POA: Insufficient documentation

## 2013-12-06 DIAGNOSIS — E119 Type 2 diabetes mellitus without complications: Secondary | ICD-10-CM | POA: Insufficient documentation

## 2013-12-06 DIAGNOSIS — G40909 Epilepsy, unspecified, not intractable, without status epilepticus: Secondary | ICD-10-CM | POA: Insufficient documentation

## 2013-12-06 HISTORY — DX: Essential (primary) hypertension: I10

## 2013-12-06 LAB — CBC WITH DIFFERENTIAL/PLATELET
BASOS PCT: 0 % (ref 0–1)
Basophils Absolute: 0 10*3/uL (ref 0.0–0.1)
EOS ABS: 0.2 10*3/uL (ref 0.0–0.7)
Eosinophils Relative: 5 % (ref 0–5)
HCT: 44.2 % (ref 39.0–52.0)
HEMOGLOBIN: 15.1 g/dL (ref 13.0–17.0)
LYMPHS ABS: 1.4 10*3/uL (ref 0.7–4.0)
Lymphocytes Relative: 29 % (ref 12–46)
MCH: 29.2 pg (ref 26.0–34.0)
MCHC: 34.2 g/dL (ref 30.0–36.0)
MCV: 85.5 fL (ref 78.0–100.0)
MONOS PCT: 6 % (ref 3–12)
Monocytes Absolute: 0.3 10*3/uL (ref 0.1–1.0)
NEUTROS ABS: 2.9 10*3/uL (ref 1.7–7.7)
NEUTROS PCT: 60 % (ref 43–77)
PLATELETS: 238 10*3/uL (ref 150–400)
RBC: 5.17 MIL/uL (ref 4.22–5.81)
RDW: 12.9 % (ref 11.5–15.5)
WBC: 4.8 10*3/uL (ref 4.0–10.5)

## 2013-12-06 LAB — COMPREHENSIVE METABOLIC PANEL
ALBUMIN: 3.9 g/dL (ref 3.5–5.2)
ALK PHOS: 60 U/L (ref 39–117)
ALT: 12 U/L (ref 0–53)
AST: 22 U/L (ref 0–37)
BILIRUBIN TOTAL: 0.7 mg/dL (ref 0.3–1.2)
BUN: 16 mg/dL (ref 6–23)
CHLORIDE: 102 meq/L (ref 96–112)
CO2: 20 mEq/L (ref 19–32)
Calcium: 9.6 mg/dL (ref 8.4–10.5)
Creatinine, Ser: 1.3 mg/dL (ref 0.50–1.35)
GFR calc Af Amer: 80 mL/min — ABNORMAL LOW (ref >90)
GFR calc non Af Amer: 69 mL/min — ABNORMAL LOW (ref >90)
GLUCOSE: 99 mg/dL (ref 70–99)
POTASSIUM: 3.7 meq/L (ref 3.7–5.3)
SODIUM: 141 meq/L (ref 137–147)
TOTAL PROTEIN: 7.2 g/dL (ref 6.0–8.3)

## 2013-12-06 LAB — VALPROIC ACID LEVEL: Valproic Acid Lvl: <10 ug/mL — ABNORMAL LOW (ref 50.0–100.0)

## 2013-12-06 LAB — CBG MONITORING, ED: Glucose-Capillary: 101 mg/dL — ABNORMAL HIGH (ref 70–99)

## 2013-12-06 MED ORDER — LORAZEPAM 2 MG/ML IJ SOLN
1.0000 mg | Freq: Once | INTRAMUSCULAR | Status: AC
  Administered 2013-12-06: 1 mg via INTRAVENOUS
  Filled 2013-12-06: qty 1

## 2013-12-06 MED ORDER — SODIUM CHLORIDE 0.9 % IV BOLUS (SEPSIS)
1000.0000 mL | Freq: Once | INTRAVENOUS | Status: AC
  Administered 2013-12-06: 1000 mL via INTRAVENOUS

## 2013-12-06 MED ORDER — ONDANSETRON HCL 4 MG/2ML IJ SOLN
4.0000 mg | Freq: Once | INTRAMUSCULAR | Status: AC
  Administered 2013-12-06: 4 mg via INTRAVENOUS
  Filled 2013-12-06: qty 2

## 2013-12-06 NOTE — ED Notes (Signed)
Pt asked for a urine sample and given urinal.

## 2013-12-06 NOTE — ED Notes (Signed)
Pt returned from radiology.

## 2013-12-06 NOTE — ED Provider Notes (Signed)
CSN: [id number]     Arrival date & time 12/06/13  1945 History   First MD Initiated Contact with Patient 12/06/13 1951     Chief Complaint  Patient presents with  . Seizures      Patient is a 38 y.o. male presenting with seizures. The history is provided by the patient and a relative.  Seizures Seizure activity on arrival: no   Postictal symptoms: confusion   Severity:  Moderate Duration: unknown. Progression:  Improving PTA treatment:  None History of seizures: yes   pt presents from home for seizure Per EMS, they were called for seizure On arrival, pt was not seizing and was confused He is now improving Initial glucose >70  Pt has h/o seizures Reports he is supposed to be on depakote but not taking for month He has no other complaints at this time  Past Medical History  Diagnosis Date  . Diabetes mellitus   . Seizures   . Hypertension    Past Surgical History  Procedure Laterality Date  . Hernia repair     No family history on file. History  Substance Use Topics  . Smoking status: Never Smoker   . Smokeless tobacco: Not on file  . Alcohol Use: No     Comment: rarely    Review of Systems  Constitutional: Negative for fever.  Neurological: Positive for seizures.  All other systems reviewed and are negative.     Allergies  Ziprasidone mesylate  Home Medications   Prior to Admission medications   Medication Sig Start Date End Date Taking? Authorizing Provider  ARIPiprazole (ABILIFY) 5 MG tablet Take 7.5 mg by mouth every morning.    Historical Provider, MD  divalproex (DEPAKOTE ER) 500 MG 24 hr tablet Take 1,500 mg by mouth at bedtime.    Historical Provider, MD  escitalopram (LEXAPRO) 10 MG tablet Take 10 mg by mouth every morning.    Historical Provider, MD  insulin aspart (NOVOLOG) 100 UNIT/ML injection Inject 4 Units into the skin 3 (three) times daily before meals. Sliding scale as directed. Pt states if his blood sugar is >170 add 2 units.     Historical Provider, MD  insulin glargine (LANTUS) 100 UNIT/ML injection Inject 25 Units into the skin at bedtime.    Historical Provider, MD  lisinopril (PRINIVIL,ZESTRIL) 10 MG tablet Take 10 mg by mouth every morning.    Historical Provider, MD  pravastatin (PRAVACHOL) 10 MG tablet Take 10 mg by mouth at bedtime.    Historical Provider, MD  traZODone (DESYREL) 100 MG tablet Take 200 mg by mouth at bedtime.    Historical Provider, MD   BP 142/81  Pulse 87  Temp(Src) 98.2 F (36.8 C) (Oral)  Resp 12  SpO2 96% Physical Exam CONSTITUTIONAL: Well developed/well nourished HEAD: Normocephalic/atraumatic EYES: EOMI/PERRL ENMT: Mucous membranes moist, bruising noted to anterior tongue NECK: supple no meningeal signs SPINE:entire spine nontender CV: S1/S2 noted, no murmurs/rubs/gallops noted LUNGS: Lungs are clear to auscultation bilaterally, no apparent distress ABDOMEN: soft, nontender, no rebound or guarding GU:no cva tenderness NEURO: Pt is awake/alert, moves all extremitiesx4.  Pt is conversant but appears confused at this time EXTREMITIES: pulses normal, full ROM, no deformities, able to range all extremities without difficulty SKIN: warm, color normal  ED Course  Procedures 8:25 PM Pt with h/o seizures, reports med noncompliance He is not fully back to baseline as he is confused He is now vomiting Will follow closely 10:36 PM Pt feels improved He is alert/oriented, answers  questions appropriately I spoke this mother Gwen PoundsMyra Nettle at [phone].  She reports he has never had formal seizure workup and has no neurologist but has had seizures before but tonight was the worst with tonic/clonic seizure activity that resolved spontaneously Pt has vomited again in the ED Will obtain CT head He has been on depakote previously for mood stabilization 12:30 AM Ct head negative Pt improved He is taking PO, he is ambulatory, and no focal neuro deficits Discussed seizure  precautions Given neuro referral  BP 138/68  Pulse 85  Temp(Src) 98.2 F (36.8 C) (Oral)  Resp 16  SpO2 100%  Labs Review Labs Reviewed  COMPREHENSIVE METABOLIC PANEL - Abnormal; Notable for the following:    GFR calc non Af Amer 69 (*)    GFR calc Af Amer 80 (*)    All other components within normal limits  VALPROIC ACID LEVEL - Abnormal; Notable for the following:    Valproic Acid Lvl <10.0 (*)    All other components within normal limits  CBG MONITORING, ED - Abnormal; Notable for the following:    Glucose-Capillary 101 (*)    All other components within normal limits  CBC WITH DIFFERENTIAL    Imaging Review Ct Head Wo Contrast  12/06/2013   CLINICAL DATA:  Questionable seizure.  EXAM: CT HEAD WITHOUT CONTRAST  TECHNIQUE: Contiguous axial images were obtained from the base of the skull through the vertex without intravenous contrast.  COMPARISON:  None.  FINDINGS: Ventricles are normal in size and configuration. There are no parenchymal masses or mass effect. There are no areas of abnormal parenchymal attenuation. No evidence of an infarct.  There are no extra-axial masses or abnormal fluid collections.  No intracranial hemorrhage.  No skull fracture or lesion.  Mild ethmoid sinus mucosal thickening.  Mastoid air cells are clear.  IMPRESSION: 1. No intracranial abnormality.   Electronically Signed   By: Amie Portlandavid  Ormond M.D.   On: 12/06/2013 23:30     EKG Interpretation   Date/Time:  Sunday Dec 06 2013 20:08:07 EDT Ventricular Rate:  75 PR Interval:  173 QRS Duration: 83 QT Interval:  381 QTC Calculation: 425 R Axis:   37 Text Interpretation:  Sinus rhythm Probable left atrial enlargement  Abnormal R-wave progression, early transition Confirmed by Bebe ShaggyWICKLINE  MD,  Dorinda HillNALD ([id number]) on 12/06/2013 8:18:27 PM      MDM   Final diagnoses:  Seizure    Nursing notes including past medical history and social history reviewed and considered in documentation Labs/vital reviewed  and considered     Joya Gaskinsonald W Kraven Calk, MD 12/07/13 0031

## 2013-12-06 NOTE — ED Notes (Signed)
Per EMS - pt had a witnessed seizure by mother. Hx of DM, initially thought it was d/t blood sugar. CBG was 71. Pt was post-ictal upon ems arrival but has slowly come back around. Last seizure was about 4-5 months ago. Pt doesn't take seizure medicine, sts he was never prescribed it. BP 145/81, HR 85, 99% O2.

## 2013-12-06 NOTE — ED Notes (Signed)
Pt vomited X 1, gave emesis bag. Made Dr. Bebe Shaggy aware.

## 2013-12-06 NOTE — ED Notes (Signed)
Pt vomited again X 1. MD aware.

## 2013-12-06 NOTE — ED Notes (Addendum)
Pt was laying down trying to get some sleep and that's the last thing he remembers, then according to his mother he had the seizure. No head trauma seen. Pt has some trauma to front of tongue, pt sts it feels a little numb. Denies pain to any other location. Nad, skin warm and dry, resp e/u.

## 2013-12-07 NOTE — Discharge Instructions (Signed)
Please be aware you may have another seizure ° °Do not drive until seen by your physician for your condition ° °Do not climb ladders/roofs/trees as a seizure can occur at that height and cause serious harm ° °Do not bathe/swim alone as a seizure can occur and cause serious harm ° °Please followup with your physician or neurologist for further testing and possible treatment ° ° ° ° °Seizure, Adult °A seizure is abnormal electrical activity in the brain. Seizures usually last from 30 seconds to 2 minutes. There are various types of seizures. °Before a seizure, you may have a warning sensation (aura) that a seizure is about to occur. An aura may include the following symptoms:  °· Fear or anxiety. °· Nausea. °· Feeling like the room is spinning (vertigo). °· Vision changes, such as seeing flashing lights or spots. °Common symptoms during a seizure include: °· A change in attention or behavior (altered mental status). °· Convulsions with rhythmic jerking movements. °· Drooling. °· Rapid eye movements. °· Grunting. °· Loss of bladder and bowel control. °· Bitter taste in the mouth. °· Tongue biting. °After a seizure, you may feel confused and sleepy. You may also have an injury resulting from convulsions during the seizure. °HOME CARE INSTRUCTIONS  °· If you are given medicines, take them exactly as prescribed by your health care provider. °· Keep all follow-up appointments as directed by your health care provider. °· Do not swim or drive or engage in risky activity during which a seizure could cause further injury to you or others until your health care provider says it is OK. °· Get adequate rest. °· Teach friends and family what to do if you have a seizure. They should: °· Lay you on the ground to prevent a fall. °· Put a cushion under your head. °· Loosen any tight clothing around your neck. °· Turn you on your side. If vomiting occurs, this helps keep your airway clear. °· Stay with you until you recover. °· Know  whether or not you need emergency care. °SEEK IMMEDIATE MEDICAL CARE IF: °· The seizure lasts longer than 5 minutes. °· The seizure is severe or you do not wake up immediately after the seizure. °· You have an altered mental status after the seizure. °· You are having more frequent or worsening seizures. °Someone should drive you to the emergency department or call local emergency services (911 in [place] °MAKE SURE YOU: °· Understand these instructions. °· Will watch your condition. °· Will get help right away if you are not doing well or get worse. °Document Released: 06/22/2000 Document Revised: 04/15/2013 Document Reviewed: 02/04/2013 °[employer]® Patient Information ©2014 [employer]. ° °

## 2014-03-04 ENCOUNTER — Emergency Department (HOSPITAL_COMMUNITY)
Admission: EM | Admit: 2014-03-04 | Discharge: 2014-03-04 | Disposition: A | Discharge status: Home / Self Care | Payer: PRIVATE HEALTH INSURANCE | Attending: Emergency Medicine | Admitting: Emergency Medicine

## 2014-03-04 DIAGNOSIS — Z7982 Long term (current) use of aspirin: Secondary | ICD-10-CM | POA: Insufficient documentation

## 2014-03-04 DIAGNOSIS — E119 Type 2 diabetes mellitus without complications: Secondary | ICD-10-CM | POA: Insufficient documentation

## 2014-03-04 DIAGNOSIS — I10 Essential (primary) hypertension: Secondary | ICD-10-CM | POA: Insufficient documentation

## 2014-03-04 DIAGNOSIS — Z794 Long term (current) use of insulin: Secondary | ICD-10-CM | POA: Insufficient documentation

## 2014-03-04 DIAGNOSIS — G40909 Epilepsy, unspecified, not intractable, without status epilepticus: Secondary | ICD-10-CM | POA: Insufficient documentation

## 2014-03-04 DIAGNOSIS — R569 Unspecified convulsions: Secondary | ICD-10-CM | POA: Insufficient documentation

## 2014-03-04 DIAGNOSIS — Z79899 Other long term (current) drug therapy: Secondary | ICD-10-CM | POA: Insufficient documentation

## 2014-03-04 LAB — URINALYSIS, ROUTINE W REFLEX MICROSCOPIC
Bilirubin Urine: NEGATIVE
GLUCOSE, UA: 500 mg/dL — AB
Hgb urine dipstick: NEGATIVE
KETONES UR: 15 mg/dL — AB
Leukocytes, UA: NEGATIVE
Nitrite: NEGATIVE
PROTEIN: 100 mg/dL — AB
Specific Gravity, Urine: 1.023 (ref 1.005–1.030)
Urobilinogen, UA: 1 mg/dL (ref 0.0–1.0)
pH: 5 (ref 5.0–8.0)

## 2014-03-04 LAB — BASIC METABOLIC PANEL
Anion gap: 18 — ABNORMAL HIGH (ref 5–15)
BUN: 25 mg/dL — AB (ref 6–23)
CO2: 20 mEq/L (ref 19–32)
Calcium: 9.4 mg/dL (ref 8.4–10.5)
Chloride: 100 mEq/L (ref 96–112)
Creatinine, Ser: 1.46 mg/dL — ABNORMAL HIGH (ref 0.50–1.35)
GFR, EST AFRICAN AMERICAN: 70 mL/min — AB (ref >90)
GFR, EST NON AFRICAN AMERICAN: 60 mL/min — AB (ref >90)
Glucose, Bld: 211 mg/dL — ABNORMAL HIGH (ref 70–99)
Potassium: 4.6 mEq/L (ref 3.7–5.3)
Sodium: 138 mEq/L (ref 137–147)

## 2014-03-04 LAB — URINE MICROSCOPIC-ADD ON

## 2014-03-04 LAB — CBC
HEMATOCRIT: 42.8 % (ref 39.0–52.0)
Hemoglobin: 14.5 g/dL (ref 13.0–17.0)
MCH: 28.5 pg (ref 26.0–34.0)
MCHC: 33.9 g/dL (ref 30.0–36.0)
MCV: 84.3 fL (ref 78.0–100.0)
Platelets: 186 10*3/uL (ref 150–400)
RBC: 5.08 MIL/uL (ref 4.22–5.81)
RDW: 12.8 % (ref 11.5–15.5)
WBC: 9 10*3/uL (ref 4.0–10.5)

## 2014-03-04 LAB — VALPROIC ACID LEVEL: Valproic Acid Lvl: <10 ug/mL — ABNORMAL LOW (ref 50.0–100.0)

## 2014-03-04 LAB — CBG MONITORING, ED: Glucose-Capillary: 119 mg/dL — ABNORMAL HIGH (ref 70–99)

## 2014-03-04 MED ORDER — VALPROATE SODIUM 500 MG/5ML IV SOLN
1000.0000 mg | Freq: Once | INTRAVENOUS | Status: AC
  Administered 2014-03-04: 1000 mg via INTRAVENOUS
  Filled 2014-03-04: qty 10

## 2014-03-04 MED ORDER — DIVALPROEX SODIUM ER 500 MG PO TB24: 1500.0000 mg | ORAL_TABLET | Freq: Every day | ORAL | Status: DC

## 2014-03-04 MED ORDER — ONDANSETRON HCL 4 MG/2ML IJ SOLN
4.0000 mg | Freq: Once | INTRAMUSCULAR | Status: AC
  Administered 2014-03-04: 4 mg via INTRAVENOUS
  Filled 2014-03-04: qty 2

## 2014-03-04 MED ORDER — VALPROATE SODIUM 500 MG/5ML IV SOLN: 1000.0000 mg | Freq: Once | INTRAVENOUS | Status: DC

## 2014-03-04 MED ORDER — VALPROATE SODIUM 500 MG/5ML IV SOLN
500.0000 mg | Freq: Once | INTRAVENOUS | Status: DC
  Filled 2014-03-04: qty 5

## 2014-03-04 NOTE — ED Notes (Signed)
Pt continues to be monitored by blood pressure, pulse ox, and 5 lead.  

## 2014-03-04 NOTE — ED Notes (Signed)
Attempted to draw pts labs was unsuccessful reported to nurse  Lawson Fiscal. Pt also provided with urinal to provide urine specimen. Pt stated he could not provide urine at this time. Will try again later.

## 2014-03-04 NOTE — ED Notes (Signed)
Pt. Reported to pharmacy Tech, that he has not taken his Depakote ER  [facility] for 2 months.  He has not been able to find a doctor to prescribe it.  He reports that he has an appointment with a new doctor

## 2014-03-04 NOTE — ED Notes (Signed)
CBG = 119 reported to nurse Lawson Fiscal

## 2014-03-04 NOTE — ED Provider Notes (Signed)
CSN: [id number]     Arrival date & time 03/04/14  4098 History   First MD Initiated Contact with Patient 03/04/14 [phone]     Chief Complaint  Patient presents with  . Seizures     (Consider location/radiation/quality/duration/timing/severity/associated sxs/prior Treatment) HPI Comments: Patient is a 38 year old male with a past medical history of insulin-dependent diabetes, seizures and hypertension who presents to the emergency department via EMS after having a seizure earlier this morning. Per EMS, mother reported that she heard her son moving around in bed and found him to be having a seizure. Unsure of how long the seizure lasted. According to mom, patient tends to have seizures when his blood sugar drops. On EMS arrival, patient was post ictal with a GCS of 14 and a CBG of 92. Patient reports he has been taking his insulin as prescribed, however he does not believe he ate enough last night before going to bed. States he has not been taking his Depakote for almost 2 months because he never got it filled. States his last seizure was about one month ago. He does not recall having a seizure this morning. Currently he states his tongue is hurting, however denies any other pain. Denies headache, dizziness, lightheadedness, vision change, chest pain, shortness of breath, abdominal pain, nausea or vomiting.  Patient is a 38 y.o. male presenting with seizures. The history is provided by the patient and the EMS personnel.  Seizures   Past Medical History  Diagnosis Date  . Diabetes mellitus   . Seizures   . Hypertension    Past Surgical History  Procedure Laterality Date  . Hernia repair     No family history on file. History  Substance Use Topics  . Smoking status: Never Smoker   . Smokeless tobacco: Not on file  . Alcohol Use: No     Comment: rarely    Review of Systems  HENT:       +tongue pain.  Neurological: Positive for seizures.  All other systems reviewed and are  negative.     Allergies  Ziprasidone mesylate  Home Medications   Prior to Admission medications   Medication Sig Start Date End Date Taking? Authorizing Provider  ARIPiprazole (ABILIFY) 5 MG tablet Take 7.5 mg by mouth every morning.   Yes Historical Provider, MD  aspirin 325 MG tablet Take 325 mg by mouth daily.   Yes Historical Provider, MD  insulin aspart (NOVOLOG) 100 UNIT/ML injection Inject 4 Units into the skin 3 (three) times daily before meals. Sliding scale as directed. Pt states if his blood sugar is >170 add 2 units.   Yes Historical Provider, MD  insulin detemir (LEVEMIR) 100 UNIT/ML injection Inject 12-15 Units into the skin 2 (two) times daily. 15 units in the morning and 12 units in the evening   Yes Historical Provider, MD  lisinopril (PRINIVIL,ZESTRIL) 10 MG tablet Take 10 mg by mouth every morning.   Yes Historical Provider, MD  simvastatin (ZOCOR) 10 MG tablet Take 10 mg by mouth daily.   Yes Historical Provider, MD  traZODone (DESYREL) 100 MG tablet Take 200 mg by mouth at bedtime.   Yes Historical Provider, MD  divalproex (DEPAKOTE ER) 500 MG 24 hr tablet Take 3 tablets (1,500 mg total) by mouth daily. 03/04/14   Trevor Mace, PA-C   BP 123/71  Pulse 68  Temp(Src) 98.2 F (36.8 C) (Oral)  Resp 18  Ht  (1.803 m)  Wt 178 lb (80.74 kg)  BMI  24.84 kg/m2  SpO2 100% Physical Exam  Nursing note and vitals reviewed. Constitutional: He is oriented to person, place, and time. He appears well-developed and well-nourished. No distress.  HENT:  Head: Normocephalic and atraumatic.  Mouth/Throat: Oropharynx is clear and moist.  Small bite marks on the tip of the tongue. No active bleeding. No lacerations.  Eyes: Conjunctivae and EOM are normal. Pupils are equal, round, and reactive to light.  Neck: Normal range of motion. Neck supple.  Cardiovascular: Normal rate, regular rhythm and normal heart sounds.   Pulmonary/Chest: Effort normal and breath sounds normal.   Abdominal: Soft. Bowel sounds are normal. There is no tenderness.  Musculoskeletal: Normal range of motion. He exhibits no edema.  Neurological: He is alert and oriented to person, place, and time. He has normal strength. No cranial nerve deficit or sensory deficit. He displays a negative Romberg sign. Coordination normal. GCS eye subscore is 4. GCS verbal subscore is 5. GCS motor subscore is 6.  Speech fluent, goal oriented. Moves limbs without ataxia. Equal grip strength bilateral.  Skin: Skin is warm and dry. He is not diaphoretic.  Psychiatric: He has a normal mood and affect. His behavior is normal.    ED Course  Procedures (including critical care time) Labs Review Labs Reviewed  BASIC METABOLIC PANEL - Abnormal; Notable for the following:    Glucose, Bld 211 (*)    BUN 25 (*)    Creatinine, Ser 1.46 (*)    GFR calc non Af Amer 60 (*)    GFR calc Af Amer 70 (*)    Anion gap 18 (*)    All other components within normal limits  VALPROIC ACID LEVEL - Abnormal; Notable for the following:    Valproic Acid Lvl <10.0 (*)    All other components within normal limits  URINALYSIS, ROUTINE W REFLEX MICROSCOPIC - Abnormal; Notable for the following:    Glucose, UA 500 (*)    Ketones, ur 15 (*)    Protein, ur 100 (*)    All other components within normal limits  URINE MICROSCOPIC-ADD ON - Abnormal; Notable for the following:    Casts HYALINE CASTS (*)    All other components within normal limits  CBG MONITORING, ED - Abnormal; Notable for the following:    Glucose-Capillary 119 (*)    All other components within normal limits  CBC    Imaging Review No results found.   EKG Interpretation None      MDM   Final diagnoses:  Seizure    Patient presenting after a seizure, partially witnessed by his mother. He is nontoxic appearing and in no apparent distress. Afebrile, vital signs stable. No focal neurologic deficits. CBG on arrival 119. States he has been off of his  Depakote. Will check a valproic acid level. Labs pending.  Valproate level sub-therapeutic. IV valproate given. Anion gap 18 most likely from seizing. No further seizure activity. Hx of seizures and now at baseline. Normal PE other than tongue trauma. Rx for depakote given. Discussed importance of medication compliance. F/u with wellness clinic as he has been instructed before. Stable for d/c. Return precautions given. Patient states understanding of treatment care plan and is agreeable.  Case discussed with attending Dr. Loretha Stapler who agrees with plan of care.   Trevor Mace, PA-C 03/04/14 1652

## 2014-03-04 NOTE — ED Notes (Signed)
Pt. Given an urinal  For urine specimen.

## 2014-03-04 NOTE — ED Notes (Signed)
Pt placed into gown and on monitor upon arrival to room. Pt monitored by blood pressure, 5 lead, and pulse ox.  

## 2014-03-04 NOTE — ED Notes (Signed)
Pt.s mother reports that she heard her son moving around in bed. And found him to be having a seizure.  Paramedics reported that his mother informed them that pt. Has seizures when his blood sugar drops.  His CBG was 92 en - route.  Pt. Is postictal. GCS of 14

## 2014-03-04 NOTE — Discharge Instructions (Signed)
It is important for you to take your Depakote as directed daily. Followup with the wellness clinic.  Seizure, Adult A seizure is abnormal electrical activity in the brain. Seizures usually last from 30 seconds to 2 minutes. There are various types of seizures. Before a seizure, you may have a warning sensation (aura) that a seizure is about to occur. An aura may include the following symptoms:   Fear or anxiety.  Nausea.  Feeling like the room is spinning (vertigo).  Vision changes, such as seeing flashing lights or spots. Common symptoms during a seizure include:  A change in attention or behavior (altered mental status).  Convulsions with rhythmic jerking movements.  Drooling.  Rapid eye movements.  Grunting.  Loss of bladder and bowel control.  Bitter taste in the mouth.  Tongue biting. After a seizure, you may feel confused and sleepy. You may also have an injury resulting from convulsions during the seizure. HOME CARE INSTRUCTIONS   If you are given medicines, take them exactly as prescribed by your health care provider.  Keep all follow-up appointments as directed by your health care provider.  Do not swim or drive or engage in risky activity during which a seizure could cause further injury to you or others until your health care provider says it is OK.  Get adequate rest.  Teach friends and family what to do if you have a seizure. They should:  Lay you on the ground to prevent a fall.  Put a cushion under your head.  Loosen any tight clothing around your neck.  Turn you on your side. If vomiting occurs, this helps keep your airway clear.  Stay with you until you recover.  Know whether or not you need emergency care. SEEK IMMEDIATE MEDICAL CARE IF:  The seizure lasts longer than 5 minutes.  The seizure is severe or you do not wake up immediately after the seizure.  You have an altered mental status after the seizure.  You are having more frequent  or worsening seizures. Someone should drive you to the emergency department or call local emergency services (911 in [place] MAKE SURE YOU:  Understand these instructions.  Will watch your condition.  Will get help right away if you are not doing well or get worse. Document Released: 06/22/2000 Document Revised: 04/15/2013 Document Reviewed: 02/04/2013 [facility] Patient Information 2015 [place], [place]. This information is not intended to replace advice given to you by your health care provider. Make sure you discuss any questions you have with your health care provider.

## 2014-03-04 NOTE — ED Notes (Signed)
Pt was able to provide urine specimen using urinal pt was did not need to be in and out cathed.

## 2014-03-04 NOTE — ED Provider Notes (Signed)
Medical screening examination/treatment/procedure(s) were performed by non-physician practitioner and as supervising physician I was immediately available for consultation/collaboration.   EKG Interpretation None       Braxden Lovering David Riaan Toledo III, MD 03/04/14 1653 

## 2014-03-04 NOTE — ED Notes (Signed)
Pt continues to be monitored by blood pressure, pulse ox, and 5 lead. Pt provided with meal from dining  services.

## 2014-03-10 ENCOUNTER — Encounter: Payer: Self-pay | Admitting: Internal Medicine

## 2014-03-10 ENCOUNTER — Ambulatory Visit: Payer: Self-pay | Attending: Internal Medicine | Admitting: Internal Medicine

## 2014-03-10 VITALS — BP 135/85 | HR 69 | Temp 98.1°F | Resp 16 | Wt 170.0 lb

## 2014-03-10 DIAGNOSIS — E785 Hyperlipidemia, unspecified: Secondary | ICD-10-CM

## 2014-03-10 DIAGNOSIS — E119 Type 2 diabetes mellitus without complications: Secondary | ICD-10-CM | POA: Insufficient documentation

## 2014-03-10 DIAGNOSIS — G40909 Epilepsy, unspecified, not intractable, without status epilepticus: Secondary | ICD-10-CM | POA: Insufficient documentation

## 2014-03-10 DIAGNOSIS — Z0000 Encounter for general adult medical examination without abnormal findings: Secondary | ICD-10-CM | POA: Insufficient documentation

## 2014-03-10 DIAGNOSIS — Z139 Encounter for screening, unspecified: Secondary | ICD-10-CM

## 2014-03-10 DIAGNOSIS — E139 Other specified diabetes mellitus without complications: Secondary | ICD-10-CM

## 2014-03-10 DIAGNOSIS — R569 Unspecified convulsions: Secondary | ICD-10-CM

## 2014-03-10 DIAGNOSIS — F319 Bipolar disorder, unspecified: Secondary | ICD-10-CM

## 2014-03-10 DIAGNOSIS — E089 Diabetes mellitus due to underlying condition without complications: Secondary | ICD-10-CM

## 2014-03-10 DIAGNOSIS — I10 Essential (primary) hypertension: Secondary | ICD-10-CM

## 2014-03-10 LAB — COMPLETE METABOLIC PANEL WITH GFR
ALBUMIN: 4.3 g/dL (ref 3.5–5.2)
ALT: 11 U/L (ref 0–53)
AST: 17 U/L (ref 0–37)
Alkaline Phosphatase: 49 U/L (ref 39–117)
BUN: 22 mg/dL (ref 6–23)
CALCIUM: 10 mg/dL (ref 8.4–10.5)
CO2: 28 mEq/L (ref 19–32)
Chloride: 102 mEq/L (ref 96–112)
Creat: 1.3 mg/dL (ref 0.50–1.35)
GFR, EST AFRICAN AMERICAN: 81 mL/min
GFR, Est Non African American: 70 mL/min
Glucose, Bld: 135 mg/dL — ABNORMAL HIGH (ref 70–99)
POTASSIUM: 4.4 meq/L (ref 3.5–5.3)
Sodium: 140 mEq/L (ref 135–145)
TOTAL PROTEIN: 6.9 g/dL (ref 6.0–8.3)
Total Bilirubin: 0.6 mg/dL (ref 0.2–1.2)

## 2014-03-10 LAB — POCT GLYCOSYLATED HEMOGLOBIN (HGB A1C): Hemoglobin A1C: 8.8

## 2014-03-10 LAB — CBC WITH DIFFERENTIAL/PLATELET
Basophils Absolute: 0 10*3/uL (ref 0.0–0.1)
Basophils Relative: 0 % (ref 0–1)
Eosinophils Absolute: 0.3 10*3/uL (ref 0.0–0.7)
Eosinophils Relative: 5 % (ref 0–5)
HCT: 42 % (ref 39.0–52.0)
Hemoglobin: 14.1 g/dL (ref 13.0–17.0)
LYMPHS ABS: 2.1 10*3/uL (ref 0.7–4.0)
Lymphocytes Relative: 35 % (ref 12–46)
MCH: 28.7 pg (ref 26.0–34.0)
MCHC: 33.6 g/dL (ref 30.0–36.0)
MCV: 85.4 fL (ref 78.0–100.0)
MONO ABS: 0.3 10*3/uL (ref 0.1–1.0)
Monocytes Relative: 5 % (ref 3–12)
NEUTROS PCT: 55 % (ref 43–77)
Neutro Abs: 3.2 10*3/uL (ref 1.7–7.7)
PLATELETS: 255 10*3/uL (ref 150–400)
RBC: 4.92 MIL/uL (ref 4.22–5.81)
RDW: 13.2 % (ref 11.5–15.5)
WBC: 5.9 10*3/uL (ref 4.0–10.5)

## 2014-03-10 LAB — GLUCOSE, POCT (MANUAL RESULT ENTRY): POC Glucose: 139 mg/dl — AB (ref 70–99)

## 2014-03-10 LAB — TSH: TSH: 0.846 u[IU]/mL (ref 0.350–4.500)

## 2014-03-10 LAB — LIPID PANEL
CHOLESTEROL: 214 mg/dL — AB (ref 0–200)
HDL: 68 mg/dL (ref >39)
LDL CALC: 131 mg/dL — AB (ref 0–99)
Total CHOL/HDL Ratio: 3.1 Ratio
Triglycerides: 76 mg/dL (ref <150)
VLDL: 15 mg/dL (ref 0–40)

## 2014-03-10 MED ORDER — DIVALPROEX SODIUM ER 500 MG PO TB24: 1500.0000 mg | ORAL_TABLET | Freq: Every day | ORAL | Status: DC

## 2014-03-10 MED ORDER — INSULIN DETEMIR 100 UNIT/ML FLEXPEN: PEN_INJECTOR | SUBCUTANEOUS | Status: DC

## 2014-03-10 NOTE — Patient Instructions (Addendum)
Diabetes Mellitus and Food It is important for you to manage your blood sugar (glucose) level. Your blood glucose level can be greatly affected by what you eat. Eating healthier foods in the appropriate amounts throughout the day at about the same time each day will help you control your blood glucose level. It can also help slow or prevent worsening of your diabetes mellitus. Healthy eating may even help you improve the level of your blood pressure and reach or maintain a healthy weight.  HOW CAN FOOD AFFECT ME? Carbohydrates Carbohydrates affect your blood glucose level more than any other type of food. Your dietitian will help you determine how many carbohydrates to eat at each meal and teach you how to count carbohydrates. Counting carbohydrates is important to keep your blood glucose at a healthy level, especially if you are using insulin or taking certain medicines for diabetes mellitus. Alcohol Alcohol can cause sudden decreases in blood glucose (hypoglycemia), especially if you use insulin or take certain medicines for diabetes mellitus. Hypoglycemia can be a life-threatening condition. Symptoms of hypoglycemia (sleepiness, dizziness, and disorientation) are similar to symptoms of having too much alcohol.  If your health care provider has given you approval to drink alcohol, do so in moderation and use the following guidelines:  Women should not have more than one drink per day, and men should not have more than two drinks per day. One drink is equal to:  12 oz of beer.  5 oz of wine.  1 oz of hard liquor.  Do not drink on an empty stomach.  Keep yourself hydrated. Have water, diet soda, or unsweetened iced tea.  Regular soda, juice, and other mixers might contain a lot of carbohydrates and should be counted. WHAT FOODS ARE NOT RECOMMENDED? As you make food choices, it is important to remember that all foods are not the same. Some foods have fewer nutrients per serving than other  foods, even though they might have the same number of calories or carbohydrates. It is difficult to get your body what it needs when you eat foods with fewer nutrients. Examples of foods that you should avoid that are high in calories and carbohydrates but low in nutrients include:  Trans fats (most processed foods list trans fats on the Nutrition Facts label).  Regular soda.  Juice.  Candy.  Sweets, such as cake, pie, doughnuts, and cookies.  Fried foods. WHAT FOODS CAN I EAT? Have nutrient-rich foods, which will nourish your body and keep you healthy. The food you should eat also will depend on several factors, including:  The calories you need.  The medicines you take.  Your weight.  Your blood glucose level.  Your blood pressure level.  Your cholesterol level. You also should eat a variety of foods, including:  Protein, such as meat, poultry, fish, tofu, nuts, and seeds (lean animal proteins are best).  Fruits.  Vegetables.  Dairy products, such as milk, cheese, and yogurt (low fat is best).  Breads, grains, pasta, cereal, rice, and beans.  Fats such as olive oil, trans fat-free margarine, canola oil, avocado, and olives. DOES EVERYONE WITH DIABETES MELLITUS HAVE THE SAME MEAL PLAN? Because every person with diabetes mellitus is different, there is not one meal plan that works for everyone. It is very important that you meet with a dietitian who will help you create a meal plan that is just right for you. Document Released: 03/22/2005 Document Revised: 06/30/2013 Document Reviewed: 05/22/2013 [employer] Patient Information 2015 [employer]. This   information is not intended to replace advice given to you by your health care provider. Make sure you discuss any questions you have with your health care provider. DASH Eating Plan DASH stands for "Dietary Approaches to Stop Hypertension." The DASH eating plan is a healthy eating plan that has been shown to reduce high  blood pressure (hypertension). Additional health benefits may include reducing the risk of type 2 diabetes mellitus, heart disease, and stroke. The DASH eating plan may also help with weight loss. WHAT DO I NEED TO KNOW ABOUT THE DASH EATING PLAN? For the DASH eating plan, you will follow these general guidelines:  Choose foods with a percent daily value for sodium of less than 5% (as listed on the food label).  Use salt-free seasonings or herbs instead of table salt or sea salt.  Check with your health care provider or pharmacist before using salt substitutes.  Eat lower-sodium products, often labeled as "lower sodium" or "no salt added."  Eat fresh foods.  Eat more vegetables, fruits, and low-fat dairy products.  Choose whole grains. Look for the word "whole" as the first word in the ingredient list.  Choose fish and skinless chicken or turkey more often than red meat. Limit fish, poultry, and meat to 6 oz (170 g) each day.  Limit sweets, desserts, sugars, and sugary drinks.  Choose heart-healthy fats.  Limit cheese to 1 oz (28 g) per day.  Eat more home-cooked food and less restaurant, buffet, and fast food.  Limit fried foods.  Cook foods using methods other than frying.  Limit canned vegetables. If you do use them, rinse them well to decrease the sodium.  When eating at a restaurant, ask that your food be prepared with less salt, or no salt if possible. WHAT FOODS CAN I EAT? Seek help from a dietitian for individual calorie needs. Grains Whole grain or whole wheat bread. Brown rice. Whole grain or whole wheat pasta. Quinoa, bulgur, and whole grain cereals. Low-sodium cereals. Corn or whole wheat flour tortillas. Whole grain cornbread. Whole grain crackers. Low-sodium crackers. Vegetables Fresh or frozen vegetables (raw, steamed, roasted, or grilled). Low-sodium or reduced-sodium tomato and vegetable juices. Low-sodium or reduced-sodium tomato sauce and paste. Low-sodium  or reduced-sodium canned vegetables.  Fruits All fresh, canned (in natural juice), or frozen fruits. Meat and Other Protein Products Ground beef (85% or leaner), grass-fed beef, or beef trimmed of fat. Skinless chicken or turkey. Ground chicken or turkey. Pork trimmed of fat. All fish and seafood. Eggs. Dried beans, peas, or lentils. Unsalted nuts and seeds. Unsalted canned beans. Dairy Low-fat dairy products, such as skim or 1% milk, 2% or reduced-fat cheeses, low-fat ricotta or cottage cheese, or plain low-fat yogurt. Low-sodium or reduced-sodium cheeses. Fats and Oils Tub margarines without trans fats. Light or reduced-fat mayonnaise and salad dressings (reduced sodium). Avocado. Safflower, olive, or canola oils. Natural peanut or almond butter. Other Unsalted popcorn and pretzels. The items listed above may not be a complete list of recommended foods or beverages. Contact your dietitian for more options. WHAT FOODS ARE NOT RECOMMENDED? Grains White bread. White pasta. White rice. Refined cornbread. Bagels and croissants. Crackers that contain trans fat. Vegetables Creamed or fried vegetables. Vegetables in a cheese sauce. Regular canned vegetables. Regular canned tomato sauce and paste. Regular tomato and vegetable juices. Fruits Dried fruits. Canned fruit in light or heavy syrup. Fruit juice. Meat and Other Protein Products Fatty cuts of meat. Ribs, chicken wings, bacon, sausage, bologna, salami, chitterlings, fatback, hot   dogs, bratwurst, and packaged luncheon meats. Salted nuts and seeds. Canned beans with salt. Dairy Whole or 2% milk, cream, half-and-half, and cream cheese. Whole-fat or sweetened yogurt. Full-fat cheeses or blue cheese. Nondairy creamers and whipped toppings. Processed cheese, cheese spreads, or cheese curds. Condiments Onion and garlic salt, seasoned salt, table salt, and sea salt. Canned and packaged gravies. Worcestershire sauce. Tartar sauce. Barbecue sauce.  Teriyaki sauce. Soy sauce, including reduced sodium. Steak sauce. Fish sauce. Oyster sauce. Cocktail sauce. Horseradish. Ketchup and mustard. Meat flavorings and tenderizers. Bouillon cubes. Hot sauce. Tabasco sauce. Marinades. Taco seasonings. Relishes. Fats and Oils Butter, stick margarine, lard, shortening, ghee, and bacon fat. Coconut, palm kernel, or palm oils. Regular salad dressings. Other Pickles and olives. Salted popcorn and pretzels. The items listed above may not be a complete list of foods and beverages to avoid. Contact your dietitian for more information. WHERE CAN I FIND MORE INFORMATION? National Heart, [employer], and [employer] Institute: www.nhlbi.nih.gov/health/health-topics/topics/dash/ Document Released: 06/14/2011 Document Revised: 11/09/2013 Document Reviewed: 04/29/2013 [employer] Patient Information 2015 [employer]. This information is not intended to replace advice given to you by your health care provider. Make sure you discuss any questions you have with your health care provider.  

## 2014-03-10 NOTE — Progress Notes (Signed)
Patient Demographics  [name], is a 38 y.o. male  RUE:[id number]  JYN:[id number]  DOB - [birth year]  CC:  Chief Complaint  Patient presents with  . Establish Care       HPI: [name] is a 38 y.o. male here today to establish medical care.Patient has history of diabetes hypertension seizure disorder, recently went to the emergency room after having seizure, EMR reviewed at that time patient was off the Depakote for a few months, his Depakote level was subtherapeutic patient was given IV valproic acid, he was given Depakote prescription for which patient needs the assistance program. And patient was advised to see a neurologist. Patient also history of upper disorder and is taking Abilify and was seen a psychiatrist in [place], since he has moved here and is to see another psychiatrist. For diabetes patient is taking Levemir and NovoLog his A1c is 8.8% as per patient he has not been compliant with low carbohydrate diet. Patient has No headache, No chest pain, No abdominal pain - No Nausea, No new weakness tingling or numbness, No Cough - SOB.  Allergies  Allergen Reactions  . Ziprasidone Mesylate Other (See Comments)    "severe muscle spasms"   Past Medical History  Diagnosis Date  . Diabetes mellitus   . Seizures   . Hypertension    Current Outpatient Prescriptions on File Prior to Visit  Medication Sig Dispense Refill  . ARIPiprazole (ABILIFY) 5 MG tablet Take 7.5 mg by mouth every morning.      Marland Kitchen aspirin 325 MG tablet Take 325 mg by mouth daily.      . insulin aspart (NOVOLOG) 100 UNIT/ML injection Inject 4 Units into the skin 3 (three) times daily before meals. Sliding scale as directed. Pt states if his blood sugar is >170 add 2 units.      . insulin detemir (LEVEMIR) 100 UNIT/ML injection Inject 12-15 Units into the skin 2 (two) times daily. 15 units in the morning and 12 units in the evening      . lisinopril (PRINIVIL,ZESTRIL) 10 MG tablet Take 10 mg by mouth  every morning.      . simvastatin (ZOCOR) 10 MG tablet Take 10 mg by mouth daily.      . traZODone (DESYREL) 100 MG tablet Take 200 mg by mouth at bedtime.       No current facility-administered medications on file prior to visit.   Family History  Problem Relation Age of Onset  . Hypertension Mother   . Diabetes Paternal Uncle    History   Social History  . Marital Status: Single    Spouse Name: N/A    Number of Children: N/A  . Years of Education: N/A   Occupational History  . Not on file.   Social History Main Topics  . Smoking status: Never Smoker   . Smokeless tobacco: Not on file  . Alcohol Use: No     Comment: rarely  . Drug Use: No  . Sexual Activity: Not on file   Other Topics Concern  . Not on file   Social History Narrative  . No narrative on file    Review of Systems: Constitutional: Negative for fever, chills, diaphoresis, activity change, appetite change and fatigue. HENT: Negative for ear pain, nosebleeds, congestion, facial swelling, rhinorrhea, neck pain, neck stiffness and ear discharge.  Eyes: Negative for pain, discharge, redness, itching and visual disturbance. Respiratory: Negative for cough, choking, chest tightness, shortness of breath, wheezing and stridor.  Cardiovascular: Negative for chest pain, palpitations and leg swelling. Gastrointestinal: Negative for abdominal distention. Genitourinary: Negative for dysuria, urgency, frequency, hematuria, flank pain, decreased urine volume, difficulty urinating and dyspareunia.  Musculoskeletal: Negative for back pain, joint swelling, arthralgia and gait problem. Neurological: Negative for dizziness, tremors, seizures, syncope, facial asymmetry, speech difficulty, weakness, light-headedness, numbness and headaches.  Hematological: Negative for adenopathy. Does not bruise/bleed easily. Psychiatric/Behavioral: Negative for hallucinations, behavioral problems, confusion, dysphoric mood, decreased  concentration and agitation.    Objective:   Filed Vitals:   03/10/14 1119  BP: 135/85  Pulse: 69  Temp: 98.1 F (36.7 C)  Resp: 16    Physical Exam: Constitutional: Patient appears well-developed and well-nourished. No distress. HENT: Normocephalic, atraumatic, External right and left ear normal. Oropharynx is clear and moist.  Eyes: Conjunctivae and EOM are normal. PERRLA, no scleral icterus. Neck: Normal ROM. Neck supple. No JVD. No tracheal deviation. No thyromegaly. CVS: RRR, S1/S2 +, no murmurs, no gallops, no carotid bruit.  Pulmonary: Effort and breath sounds normal, no stridor, rhonchi, wheezes, rales.  Abdominal: Soft. BS +, no distension, tenderness, rebound or guarding.  Musculoskeletal: Normal range of motion. No edema and no tenderness.  Neuro: Alert. Normal reflexes, muscle tone coordination. No cranial nerve deficit. Skin: Skin is warm and dry. No rash noted. Not diaphoretic. No erythema. No pallor. Psychiatric: Normal mood and affect. Behavior, judgment, thought content normal.  Lab Results  Component Value Date   WBC 9.0 03/04/2014   HGB 14.5 03/04/2014   HCT 42.8 03/04/2014   MCV 84.3 03/04/2014   PLT 186 03/04/2014   Lab Results  Component Value Date   CREATININE 1.46* 03/04/2014   BUN 25* 03/04/2014   NA 138 03/04/2014   K 4.6 03/04/2014   CL 100 03/04/2014   CO2 20 03/04/2014    Lab Results  Component Value Date   HGBA1C 8.8 03/10/2014   Lipid Panel     Component Value Date/Time   CHOL 240* 11/25/2009 2139   TRIG 52 11/25/2009 2139   HDL 85 11/25/2009 2139   CHOLHDL 2.8 Ratio 11/25/2009 2139   VLDL 10 11/25/2009 2139   LDLCALC 145* 11/25/2009 2139       Assessment and plan:   1. Diabetes mellitus due to underlying condition without complications Results for orders placed in visit on 03/10/14  GLUCOSE, POCT (MANUAL RESULT ENTRY)      Result Value Ref Range   POC Glucose 139 (*) 70 - 99 mg/dl  POCT GLYCOSYLATED HEMOGLOBIN (HGB A1C)      Result  Value Ref Range   Hemoglobin A1C 8.8     I have advised patient for diabetes planning, continue with his current meds, will repeat A1c in 3 months - Insulin Detemir (LEVEMIR FLEXPEN) 100 UNIT/ML Pen; Inject into the skin BID.  15 units in the morning and 12 units in the evening  Dispense: 15 mL; Refill: 11  2. Essential hypertension, benign Advised for DASH diet continue with lisinopril.  3. Convulsions, unspecified convulsion type Continue Depakote also referred to neurology. - divalproex (DEPAKOTE ER) 500 MG 24 hr tablet; Take 3 tablets (1,500 mg total) by mouth daily.  Dispense: 90 tablet; Refill: 3 - Ambulatory referral to Neurology  4. Other and unspecified hyperlipidemia Will repeat lipid panel currently taking simvastatin 10 mg daily - Lipid panel  5. BIPOLAR DISORDER UNSPECIFIED Currently patient is on Abilify  - Ambulatory referral to Psychiatry  6. Screening We'll do baseline blood work. - CBC with Differential -  TSH - COMPLETE METABOLIC PANEL WITH GFR - Vit D  25 hydroxy (rtn osteoporosis monitoring)     Return in about 3 months (around 06/09/2014) for diabetes, hyperipidemia, hypertension.    Doris Cheadle, MD

## 2014-03-10 NOTE — Progress Notes (Signed)
Patient here to establish care Has history of DM and seizure disorder

## 2014-03-11 ENCOUNTER — Telehealth: Payer: Self-pay | Admitting: Emergency Medicine

## 2014-03-11 LAB — VITAMIN D 25 HYDROXY (VIT D DEFICIENCY, FRACTURES): Vit D, 25-Hydroxy: 32 ng/mL (ref 30–89)

## 2014-03-11 MED ORDER — SIMVASTATIN 20 MG PO TABS: 20.0000 mg | ORAL_TABLET | Freq: Every day | ORAL | Status: DC

## 2014-03-11 NOTE — Telephone Encounter (Signed)
Pt given lab results with instructions to increase medication Simvastatin to 20 mg tab daily  Pt verbalized understanding and will pick new script up tomorrow @ [facility]

## 2014-03-11 NOTE — Telephone Encounter (Signed)
Message copied by Darlis Loan on Thu Mar 11, 2014 12:45 PM ------      Message from: Doris Cheadle      Created: Thu Mar 11, 2014  9:41 AM       Blood work reviewed still LDL >100  Advise patient to increase the dose of simvastatin to 20 mg daily. His rest of blood work is normal.       ------

## 2014-03-12 ENCOUNTER — Ambulatory Visit: Payer: PRIVATE HEALTH INSURANCE | Attending: Internal Medicine

## 2014-03-22 ENCOUNTER — Other Ambulatory Visit: Payer: Self-pay

## 2014-03-22 DIAGNOSIS — R569 Unspecified convulsions: Secondary | ICD-10-CM

## 2014-03-22 MED ORDER — INSULIN DETEMIR 100 UNIT/ML ~~LOC~~ SOLN: 12.0000 [IU] | Freq: Two times a day (BID) | SUBCUTANEOUS | Status: DC

## 2014-03-22 MED ORDER — DIVALPROEX SODIUM ER 500 MG PO TB24: 1500.0000 mg | ORAL_TABLET | Freq: Every day | ORAL | Status: DC

## 2014-03-30 ENCOUNTER — Other Ambulatory Visit: Payer: Self-pay

## 2014-03-30 MED ORDER — LISINOPRIL 10 MG PO TABS: 10.0000 mg | ORAL_TABLET | ORAL | Status: DC

## 2014-04-01 ENCOUNTER — Ambulatory Visit: Payer: Self-pay | Attending: Internal Medicine | Admitting: Internal Medicine

## 2014-04-01 ENCOUNTER — Encounter: Payer: Self-pay | Admitting: Internal Medicine

## 2014-04-01 VITALS — BP 134/76 | HR 76 | Temp 98.3°F | Resp 16 | Wt 176.8 lb

## 2014-04-01 DIAGNOSIS — I10 Essential (primary) hypertension: Secondary | ICD-10-CM | POA: Insufficient documentation

## 2014-04-01 DIAGNOSIS — Z794 Long term (current) use of insulin: Secondary | ICD-10-CM | POA: Insufficient documentation

## 2014-04-01 DIAGNOSIS — E139 Other specified diabetes mellitus without complications: Secondary | ICD-10-CM

## 2014-04-01 DIAGNOSIS — E162 Hypoglycemia, unspecified: Secondary | ICD-10-CM | POA: Insufficient documentation

## 2014-04-01 DIAGNOSIS — R259 Unspecified abnormal involuntary movements: Secondary | ICD-10-CM | POA: Insufficient documentation

## 2014-04-01 DIAGNOSIS — R251 Tremor, unspecified: Secondary | ICD-10-CM

## 2014-04-01 DIAGNOSIS — R569 Unspecified convulsions: Secondary | ICD-10-CM | POA: Insufficient documentation

## 2014-04-01 DIAGNOSIS — E119 Type 2 diabetes mellitus without complications: Secondary | ICD-10-CM | POA: Insufficient documentation

## 2014-04-01 DIAGNOSIS — Z7982 Long term (current) use of aspirin: Secondary | ICD-10-CM | POA: Insufficient documentation

## 2014-04-01 DIAGNOSIS — Z23 Encounter for immunization: Secondary | ICD-10-CM | POA: Insufficient documentation

## 2014-04-01 DIAGNOSIS — E089 Diabetes mellitus due to underlying condition without complications: Secondary | ICD-10-CM

## 2014-04-01 LAB — GLUCOSE, POCT (MANUAL RESULT ENTRY)
POC GLUCOSE: 37 mg/dL — AB (ref 70–99)
POC GLUCOSE: 38 mg/dL — AB (ref 70–99)
POC Glucose: 138 mg/dl — AB (ref 70–99)

## 2014-04-01 NOTE — Progress Notes (Signed)
MRN: [id number] Name: [name]  Sex: male Age: 38 y.o. DOB: 03/28/1976  Allergies: Ziprasidone mesylate  Chief Complaint  Patient presents with  . Follow-up    HPI: Patient is 38 y.o. male who has history of diabetes hypertension, seizure disorder comes today reported to have symptoms of tremors feeling tired fatigued disturbance with the sleep, he noticed her since he has been on Depakote, as per patient prior to going to the ER when his Depakote level was low he was taking 500 mg to once daily since he has started taking 3 pills he noticed these symptoms, denies any headache dizziness chest and shortness of breath is blood sugar is low as per patient he only took Levemir today and as per patient he has eaten breakfast, as per patient he does not get typical symptoms of hypoglycemia even when his blood sugars that low, patient thinks more of these symptoms are from his seizure medication, patient had a gatorade and apple his blood sugar improved.  Past Medical History  Diagnosis Date  . Diabetes mellitus   . Seizures   . Hypertension     Past Surgical History  Procedure Laterality Date  . Hernia repair        Medication List       This list is accurate as of: 04/01/14 12:32 PM.  Always use your most recent med list.               ARIPiprazole 5 MG tablet  Commonly known as:  ABILIFY  Take 7.5 mg by mouth every morning.     aspirin 325 MG tablet  Take 325 mg by mouth daily.     divalproex 500 MG 24 hr tablet  Commonly known as:  DEPAKOTE ER  Take 3 tablets (1,500 mg total) by mouth daily.     insulin aspart 100 UNIT/ML injection  Commonly known as:  novoLOG  Inject 4 Units into the skin 3 (three) times daily before meals. Sliding scale as directed. Pt states if his blood sugar is >170 add 2 units.     Insulin Detemir 100 UNIT/ML Pen  Commonly known as:  LEVEMIR FLEXPEN  Inject into the skin BID.  15 units in the morning and 12 units in the evening     insulin detemir 100 UNIT/ML injection  Commonly known as:  LEVEMIR  Inject 0.12-0.15 mLs (12-15 Units total) into the skin 2 (two) times daily. 15 units in the morning and 12 units in the evening     lisinopril 10 MG tablet  Commonly known as:  PRINIVIL,ZESTRIL  Take 1 tablet (10 mg total) by mouth every morning.     simvastatin 20 MG tablet  Commonly known as:  ZOCOR  Take 1 tablet (20 mg total) by mouth daily.     traZODone 100 MG tablet  Commonly known as:  DESYREL  Take 200 mg by mouth at bedtime.        No orders of the defined types were placed in this encounter.    Immunization History  Administered Date(s) Administered  . Influenza Whole 03/24/2008, 05/05/2009  . Influenza,inj,Quad PF,36+ Mos 04/01/2014  . Pneumococcal Polysaccharide-23 07/10/2007  . Td 04/04/2007    Family History  Problem Relation Age of Onset  . Hypertension Mother   . Diabetes Paternal Uncle     History  Substance Use Topics  . Smoking status: Never Smoker   . Smokeless tobacco: Not on file  . Alcohol Use: No  Comment: rarely    Review of Systems   As noted in HPI  Filed Vitals:   04/01/14 1133  BP: 134/76  Pulse: 76  Temp: 98.3 F (36.8 C)  Resp: 16    Physical Exam  Physical Exam  Constitutional: No distress.  Eyes: EOM are normal. Pupils are equal, round, and reactive to light.  Cardiovascular: Normal rate and regular rhythm.   Pulmonary/Chest: Breath sounds normal. No respiratory distress. He has no wheezes. He has no rales.  Musculoskeletal: He exhibits no edema.  Outstretched hands minimal  tremors     CBC    Component Value Date/Time   WBC 5.9 03/10/2014 1139   RBC 4.92 03/10/2014 1139   HGB 14.1 03/10/2014 1139   HCT 42.0 03/10/2014 1139   PLT 255 03/10/2014 1139   MCV 85.4 03/10/2014 1139   LYMPHSABS 2.1 03/10/2014 1139   MONOABS 0.3 03/10/2014 1139   EOSABS 0.3 03/10/2014 1139   BASOSABS 0.0 03/10/2014 1139    CMP     Component Value Date/Time   NA 140  03/10/2014 1139   K 4.4 03/10/2014 1139   CL 102 03/10/2014 1139   CO2 28 03/10/2014 1139   GLUCOSE 135* 03/10/2014 1139   BUN 22 03/10/2014 1139   CREATININE 1.30 03/10/2014 1139   CREATININE 1.46* 03/04/2014 0830   CALCIUM 10.0 03/10/2014 1139   PROT 6.9 03/10/2014 1139   ALBUMIN 4.3 03/10/2014 1139   AST 17 03/10/2014 1139   ALT 11 03/10/2014 1139   ALKPHOS 49 03/10/2014 1139   BILITOT 0.6 03/10/2014 1139   GFRNONAA 70 03/10/2014 1139   GFRNONAA 60* 03/04/2014 0830   GFRAA 81 03/10/2014 1139   GFRAA 70* 03/04/2014 0830    Lab Results  Component Value Date/Time   CHOL 214* 03/10/2014 11:39 AM    No components found with this basename: hga1c    Lab Results  Component Value Date/Time   AST 17 03/10/2014 11:39 AM    Assessment and Plan  Diabetes mellitus due to underlying condition without complications - Plan:  Results for orders placed in visit on 04/01/14  GLUCOSE, POCT (MANUAL RESULT ENTRY)      Result Value Ref Range   POC Glucose 37 (*) 70 - 99 mg/dl  GLUCOSE, POCT (MANUAL RESULT ENTRY)      Result Value Ref Range   POC Glucose 38 (*) 70 - 99 mg/dl  GLUCOSE, POCT (MANUAL RESULT ENTRY)      Result Value Ref Range   POC Glucose 138 (*) 70 - 99 mg/dl   Blood sugar level improved, advise patient to keep something with him when his blood sugar drops he can eat.  Convulsions, unspecified convulsion type - Plan: Will check Valproic acid level, patient currently taking 1 pill 3 times a day, already advised patient to reduce the dose to 500 mg twice a day since patient mentioned when he was taking 500 mg even then his seizures were controlled.  Hypoglycemia Have advised patient to decrease the dose of made to 12 units twice a day, keep the fingerstick log her and bring it in 2 weeks.  Occasional tremors ? Secondary to hypoglycemia or Depakote, he will reduce the dose of Depakote to 500 mg daily  Health Maintenance   -Influenza shot given today   Return in about 3 months (around 07/01/2014) for  diabetes, CBG  check in 2 weeks/Nurse Visit.  Doris Cheadle, MD

## 2014-04-01 NOTE — Progress Notes (Signed)
Patient complains of not feeling "right" since he started taking depakote Feels off balance, hands are shaky  Today presents with low blood sugar Snack and gatorade given in the office

## 2014-04-02 LAB — VALPROIC ACID LEVEL: Valproic Acid Lvl: 113.8 ug/mL — ABNORMAL HIGH (ref 50.0–100.0)

## 2014-04-05 ENCOUNTER — Telehealth: Payer: Self-pay | Admitting: *Deleted

## 2014-04-05 NOTE — Telephone Encounter (Signed)
Message copied by Raynelle Chary on Mon Apr 05, 2014  2:48 PM ------      Message from: Doris Cheadle      Created: Fri Apr 02, 2014 10:12 AM       Call and let the patient know that his Depakote level was supratherapeutic, as already discussed with the patient he will reduce the  dose of medication to 2 pills a day. ------

## 2014-04-05 NOTE — Telephone Encounter (Signed)
Pt is aware of the doctors instructions.

## 2014-06-22 ENCOUNTER — Ambulatory Visit: Payer: PRIVATE HEALTH INSURANCE | Admitting: Internal Medicine

## 2014-07-07 ENCOUNTER — Ambulatory Visit: Payer: Self-pay | Attending: Internal Medicine | Admitting: Internal Medicine

## 2014-07-07 ENCOUNTER — Encounter: Payer: Self-pay | Admitting: Internal Medicine

## 2014-07-07 VITALS — BP 145/84 | HR 64 | Temp 98.0°F | Resp 16 | Wt 180.2 lb

## 2014-07-07 DIAGNOSIS — K0889 Other specified disorders of teeth and supporting structures: Secondary | ICD-10-CM

## 2014-07-07 DIAGNOSIS — E785 Hyperlipidemia, unspecified: Secondary | ICD-10-CM | POA: Insufficient documentation

## 2014-07-07 DIAGNOSIS — Z79899 Other long term (current) drug therapy: Secondary | ICD-10-CM | POA: Insufficient documentation

## 2014-07-07 DIAGNOSIS — Z7982 Long term (current) use of aspirin: Secondary | ICD-10-CM | POA: Insufficient documentation

## 2014-07-07 DIAGNOSIS — E119 Type 2 diabetes mellitus without complications: Secondary | ICD-10-CM | POA: Insufficient documentation

## 2014-07-07 DIAGNOSIS — I10 Essential (primary) hypertension: Secondary | ICD-10-CM | POA: Insufficient documentation

## 2014-07-07 DIAGNOSIS — G40909 Epilepsy, unspecified, not intractable, without status epilepticus: Secondary | ICD-10-CM | POA: Insufficient documentation

## 2014-07-07 DIAGNOSIS — E139 Other specified diabetes mellitus without complications: Secondary | ICD-10-CM

## 2014-07-07 DIAGNOSIS — Z794 Long term (current) use of insulin: Secondary | ICD-10-CM | POA: Insufficient documentation

## 2014-07-07 DIAGNOSIS — K088 Other specified disorders of teeth and supporting structures: Secondary | ICD-10-CM | POA: Insufficient documentation

## 2014-07-07 LAB — LIPID PANEL
CHOL/HDL RATIO: 3.7 ratio
Cholesterol: 247 mg/dL — ABNORMAL HIGH (ref 0–200)
HDL: 66 mg/dL (ref >39)
LDL Cholesterol: 157 mg/dL — ABNORMAL HIGH (ref 0–99)
TRIGLYCERIDES: 120 mg/dL (ref <150)
VLDL: 24 mg/dL (ref 0–40)

## 2014-07-07 LAB — COMPLETE METABOLIC PANEL WITH GFR
ALK PHOS: 57 U/L (ref 39–117)
ALT: 19 U/L (ref 0–53)
AST: 22 U/L (ref 0–37)
Albumin: 4.3 g/dL (ref 3.5–5.2)
BILIRUBIN TOTAL: 0.5 mg/dL (ref 0.2–1.2)
BUN: 20 mg/dL (ref 6–23)
CO2: 28 meq/L (ref 19–32)
CREATININE: 1.43 mg/dL — AB (ref 0.50–1.35)
Calcium: 9.7 mg/dL (ref 8.4–10.5)
Chloride: 97 mEq/L (ref 96–112)
GFR, EST AFRICAN AMERICAN: 72 mL/min
GFR, Est Non African American: 63 mL/min
Glucose, Bld: 277 mg/dL — ABNORMAL HIGH (ref 70–99)
Potassium: 4.9 mEq/L (ref 3.5–5.3)
Sodium: 135 mEq/L (ref 135–145)
TOTAL PROTEIN: 7.2 g/dL (ref 6.0–8.3)

## 2014-07-07 LAB — GLUCOSE, POCT (MANUAL RESULT ENTRY): POC Glucose: 298 mg/dl — AB (ref 70–99)

## 2014-07-07 LAB — POCT GLYCOSYLATED HEMOGLOBIN (HGB A1C): Hemoglobin A1C: 8.7

## 2014-07-07 MED ORDER — AMOXICILLIN-POT CLAVULANATE 875-125 MG PO TABS: 1.0000 | ORAL_TABLET | Freq: Two times a day (BID) | ORAL | Status: DC

## 2014-07-07 NOTE — Progress Notes (Signed)
MRN: [id number] Name: [name]  Sex: male Age: 38 y.o. DOB: 09/02/1975  Allergies: Ziprasidone mesylate  Chief Complaint  Patient presents with  . gum infection    HPI: Patient is 38 y.o. male who history of diabetes hypertension hyperlipidemia, seizure disorder comes today for followup, as per patient the insulin he is taking is 12 units twice a day, as per patient his fasting blood sugar is usually more than 160 mg a deciliter, denies any hypoglycemic symptoms, as per patient her he had a gatorde before he came, his blood sugar is elevated, currently denies any acute symptoms, his major concern is pain in his teeth for the last one week as per patient he had leftover antibiotics which he took for 3 days it helped him with the symptoms, denies any fever chills sore throat chest pain shortness of breath.  Past Medical History  Diagnosis Date  . Diabetes mellitus   . Seizures   . Hypertension     Past Surgical History  Procedure Laterality Date  . Hernia repair        Medication List       This list is accurate as of: 07/07/14  9:59 AM.  Always use your most recent med list.               amoxicillin-clavulanate 875-125 MG per tablet  Commonly known as:  AUGMENTIN  Take 1 tablet by mouth 2 (two) times daily.     ARIPiprazole 5 MG tablet  Commonly known as:  ABILIFY  Take 7.5 mg by mouth every morning.     aspirin 325 MG tablet  Take 325 mg by mouth daily.     divalproex 500 MG 24 hr tablet  Commonly known as:  DEPAKOTE ER  Take 3 tablets (1,500 mg total) by mouth daily.     insulin aspart 100 UNIT/ML injection  Commonly known as:  novoLOG  Inject 4 Units into the skin 3 (three) times daily before meals. Sliding scale as directed. Pt states if his blood sugar is >170 add 2 units.     Insulin Detemir 100 UNIT/ML Pen  Commonly known as:  LEVEMIR FLEXPEN  Inject into the skin BID.  15 units in the morning and 12 units in the evening     insulin detemir  100 UNIT/ML injection  Commonly known as:  LEVEMIR  Inject 0.12-0.15 mLs (12-15 Units total) into the skin 2 (two) times daily. 15 units in the morning and 12 units in the evening     lisinopril 10 MG tablet  Commonly known as:  PRINIVIL,ZESTRIL  Take 1 tablet (10 mg total) by mouth every morning.     simvastatin 20 MG tablet  Commonly known as:  ZOCOR  Take 1 tablet (20 mg total) by mouth daily.     traZODone 100 MG tablet  Commonly known as:  DESYREL  Take 200 mg by mouth at bedtime.        Meds ordered this encounter  Medications  . amoxicillin-clavulanate (AUGMENTIN) 875-125 MG per tablet    Sig: Take 1 tablet by mouth 2 (two) times daily.    Dispense:  20 tablet    Refill:  0    Immunization History  Administered Date(s) Administered  . Influenza Whole 03/24/2008, 05/05/2009  . Influenza,inj,Quad PF,36+ Mos 04/01/2014  . Pneumococcal Polysaccharide-23 07/10/2007  . Td 04/04/2007    Family History  Problem Relation Age of Onset  . Hypertension Mother   . Diabetes  Paternal Uncle     History  Substance Use Topics  . Smoking status: Never Smoker   . Smokeless tobacco: Not on file  . Alcohol Use: No     Comment: rarely    Review of Systems   As noted in HPI  Filed Vitals:   07/07/14 0930  BP: 145/84  Pulse: 64  Temp: 98 F (36.7 C)  Resp: 16    Physical Exam  Physical Exam  Constitutional: No distress.  HENT:  Left lower molar tooth loose dental caries   Eyes: EOM are normal. Pupils are equal, round, and reactive to light.  Cardiovascular: Normal rate and regular rhythm.   Pulmonary/Chest: Breath sounds normal. No respiratory distress. He has no wheezes. He has no rales.    CBC    Component Value Date/Time   WBC 5.9 03/10/2014 1139   RBC 4.92 03/10/2014 1139   HGB 14.1 03/10/2014 1139   HCT 42.0 03/10/2014 1139   PLT 255 03/10/2014 1139   MCV 85.4 03/10/2014 1139   LYMPHSABS 2.1 03/10/2014 1139   MONOABS 0.3 03/10/2014 1139   EOSABS  0.3 03/10/2014 1139   BASOSABS 0.0 03/10/2014 1139    CMP     Component Value Date/Time   NA 140 03/10/2014 1139   K 4.4 03/10/2014 1139   CL 102 03/10/2014 1139   CO2 28 03/10/2014 1139   GLUCOSE 135* 03/10/2014 1139   BUN 22 03/10/2014 1139   CREATININE 1.30 03/10/2014 1139   CREATININE 1.46* 03/04/2014 0830   CALCIUM 10.0 03/10/2014 1139   PROT 6.9 03/10/2014 1139   ALBUMIN 4.3 03/10/2014 1139   AST 17 03/10/2014 1139   ALT 11 03/10/2014 1139   ALKPHOS 49 03/10/2014 1139   BILITOT 0.6 03/10/2014 1139   GFRNONAA 70 03/10/2014 1139   GFRNONAA 60* 03/04/2014 0830   GFRAA 81 03/10/2014 1139   GFRAA 70* 03/04/2014 0830    Lab Results  Component Value Date/Time   CHOL 214* 03/10/2014 11:39 AM    No components found for: HGA1C  Lab Results  Component Value Date/Time   AST 17 03/10/2014 11:39 AM    Assessment and Plan  Other specified diabetes mellitus without complications - Plan:  Results for orders placed or performed in visit on 07/07/14  Glucose (CBG)  Result Value Ref Range   POC Glucose 298.0 (A) 70 - 99 mg/dl  HgB [id number]  Result Value Ref Range   Hemoglobin A1C 8.70   diabetes is still uncontrolled , hemoglobin A1c slightly improved, I have advised patient to resume back on 15 units at night and 12 units in the morning and titrate up to 15 units in the morning, patient understands and will titrate, also advise for diabetes meal planning, will repeat A1c in 3 months.  Loose, teeth - Plan: amoxicillin-clavulanate (AUGMENTIN) 875-125 MG per tablet, Ambulatory referral to Dentistry  Pain, dental - Plan: amoxicillin-clavulanate (AUGMENTIN) 875-125 MG per tablet, Ambulatory referral to Dentistry  Essential hypertension, benign - Plan: her blood pressure is borderline elevated as per patient is not taking medication today , patient will continued current medications will repeat blood chemistry COMPLETE METABOLIC PANEL WITH GFR  Hyperlipidemia - Plan: currently  patient is on Zocor 20 mg each bedtime, will repeat Lipid panel    Return in about 3 months (around 10/06/2014) for diabetes, hypertension, hyperipidemia.  Doris CheadleADVANI, Tonishia Steffy, MD

## 2014-07-07 NOTE — Progress Notes (Signed)
Patient complains of having an infection  to his gums on the lower left side of mouth Was taking some left over antibiotics which helped but not cleared quite 100% Patient thinks it might be from acid reflux when he has a seizure

## 2014-07-08 ENCOUNTER — Telehealth: Payer: Self-pay

## 2014-07-08 DIAGNOSIS — E7800 Pure hypercholesterolemia, unspecified: Secondary | ICD-10-CM

## 2014-07-08 MED ORDER — SIMVASTATIN 40 MG PO TABS: 40.0000 mg | ORAL_TABLET | Freq: Every day | ORAL | Status: DC

## 2014-07-08 NOTE — Telephone Encounter (Signed)
-----   Message from Doris Cheadleeepak Advani, MD sent at 07/08/2014  9:22 AM EST ----- Call and let the patient know that his LDL/bad cholesterol has increased, advise patient for low-fat diet also increase the dose of Zocor to 40 mg daily will repeat lipid panel on next visit.

## 2014-07-08 NOTE — Telephone Encounter (Signed)
Patient is aware of his lab results Prescription for zocor 40mg  sent to the pharmacy

## 2014-07-16 ENCOUNTER — Ambulatory Visit: Payer: PRIVATE HEALTH INSURANCE

## 2014-08-09 ENCOUNTER — Encounter (HOSPITAL_COMMUNITY): Payer: Self-pay | Admitting: Adult Health

## 2014-08-09 ENCOUNTER — Emergency Department (HOSPITAL_COMMUNITY)
Admission: EM | Admit: 2014-08-09 | Discharge: 2014-08-09 | Disposition: A | Discharge status: Home / Self Care | Payer: PRIVATE HEALTH INSURANCE | Attending: Emergency Medicine | Admitting: Emergency Medicine

## 2014-08-09 ENCOUNTER — Emergency Department (HOSPITAL_COMMUNITY): Payer: PRIVATE HEALTH INSURANCE

## 2014-08-09 DIAGNOSIS — E119 Type 2 diabetes mellitus without complications: Secondary | ICD-10-CM | POA: Insufficient documentation

## 2014-08-09 DIAGNOSIS — Z79899 Other long term (current) drug therapy: Secondary | ICD-10-CM | POA: Insufficient documentation

## 2014-08-09 DIAGNOSIS — Z794 Long term (current) use of insulin: Secondary | ICD-10-CM | POA: Insufficient documentation

## 2014-08-09 DIAGNOSIS — Z792 Long term (current) use of antibiotics: Secondary | ICD-10-CM | POA: Insufficient documentation

## 2014-08-09 DIAGNOSIS — I10 Essential (primary) hypertension: Secondary | ICD-10-CM | POA: Insufficient documentation

## 2014-08-09 DIAGNOSIS — M25512 Pain in left shoulder: Secondary | ICD-10-CM | POA: Insufficient documentation

## 2014-08-09 DIAGNOSIS — Z7982 Long term (current) use of aspirin: Secondary | ICD-10-CM | POA: Insufficient documentation

## 2014-08-09 LAB — CBG MONITORING, ED: GLUCOSE-CAPILLARY: 144 mg/dL — AB (ref 70–99)

## 2014-08-09 NOTE — ED Provider Notes (Signed)
CSN: [id number]     Arrival date & time 08/09/14  1506 History   First MD Initiated Contact with Patient 08/09/14 1514     Chief Complaint  Patient presents with  . Shoulder Pain     (Consider location/radiation/quality/duration/timing/severity/associated sxs/prior Treatment) The history is provided by the patient. No language interpreter was used.  [name] is a 39 year old male with past medical history of diabetes, seizures, hypertension presenting to the emergency department with left shoulder pain that has been ongoing since April/May 2015. Patient reported that when he had his last seizure of the year, back in April/May 2015, reported that he believes he hurt his shoulder during the process. Stated he's been having left shoulder pain described as a stinging sensation. Stated that when he does certain motions or quick motions to the shoulder or to the left arm feels as if there is a "separation" that is ongoing. Stated that he feels as if his shoulder continuously goes in and out of place. Patient reported that he was playing basketball and he felt as if his shoulder went out of place. Stated that early in the morning as a sharp pain that turns into an underlying throbbing sensation. Reports the most discomfort is localized to the left shoulder. Denied radiation. Denied use of any over-the-counter medications. Denied fall, trauma, loss of sensation, neck pain, neck stiffness, elbow/wrist/hand pain. PCP Dr. Orpah Cobb  Past Medical History  Diagnosis Date  . Diabetes mellitus   . Seizures   . Hypertension    Past Surgical History  Procedure Laterality Date  . Hernia repair     Family History  Problem Relation Age of Onset  . Hypertension Mother   . Diabetes Paternal Uncle    History  Substance Use Topics  . Smoking status: Never Smoker   . Smokeless tobacco: Not on file  . Alcohol Use: No     Comment: rarely    Review of Systems  Musculoskeletal: Positive for arthralgias  (left shoulder pain ). Negative for joint swelling, neck pain and neck stiffness.  Neurological: Negative for weakness and numbness.      Allergies  Ziprasidone mesylate  Home Medications   Prior to Admission medications   Medication Sig Start Date End Date Taking? Authorizing Provider  amoxicillin-clavulanate (AUGMENTIN) 875-125 MG per tablet Take 1 tablet by mouth 2 (two) times daily. 07/07/14   Doris Cheadle, MD  ARIPiprazole (ABILIFY) 5 MG tablet Take 7.5 mg by mouth every morning.    Historical Provider, MD  aspirin 325 MG tablet Take 325 mg by mouth daily.    Historical Provider, MD  divalproex (DEPAKOTE ER) 500 MG 24 hr tablet Take 3 tablets (1,500 mg total) by mouth daily. 03/22/14   Doris Cheadle, MD  insulin aspart (NOVOLOG) 100 UNIT/ML injection Inject 4 Units into the skin 3 (three) times daily before meals. Sliding scale as directed. Pt states if his blood sugar is >170 add 2 units.    Historical Provider, MD  Insulin Detemir (LEVEMIR FLEXPEN) 100 UNIT/ML Pen Inject into the skin BID.  15 units in the morning and 12 units in the evening 03/10/14   Doris Cheadle, MD  insulin detemir (LEVEMIR) 100 UNIT/ML injection Inject 0.12-0.15 mLs (12-15 Units total) into the skin 2 (two) times daily. 15 units in the morning and 12 units in the evening 03/22/14   Doris Cheadle, MD  lisinopril (PRINIVIL,ZESTRIL) 10 MG tablet Take 1 tablet (10 mg total) by mouth every morning. 03/30/14   Deepak Advani,  MD  simvastatin (ZOCOR) 20 MG tablet Take 1 tablet (20 mg total) by mouth daily. 03/11/14   Doris Cheadleeepak Advani, MD  simvastatin (ZOCOR) 40 MG tablet Take 1 tablet (40 mg total) by mouth at bedtime. 07/08/14   Doris Cheadleeepak Advani, MD  traZODone (DESYREL) 100 MG tablet Take 200 mg by mouth at bedtime.    Historical Provider, MD   BP 113/67 mmHg  Pulse 84  Temp(Src) 98.7 F (37.1 C) (Oral)  Resp 22  SpO2 99% Physical Exam  Constitutional: He is oriented to person, place, and time. He appears well-developed and  well-nourished. No distress.  HENT:  Head: Normocephalic and atraumatic.  Eyes: Conjunctivae and EOM are normal. Pupils are equal, round, and reactive to light. Right eye exhibits no discharge. Left eye exhibits no discharge.  Neck: Normal range of motion. Neck supple. No tracheal deviation present.  Negative neck stiffness Negative nuchal rigidity Negative cervical lymphadenopathy Negative meningeal signs Negative tenderness upon palpation to C-spine Negative muscular tenderness upon palpation on negative tenderness upon palpation to the trapezius muscles bilaterally  Cardiovascular: Normal rate, regular rhythm and normal heart sounds.   Pulses:      Radial pulses are 2+ on the right side, and 2+ on the left side.  Cap refill < 3 seconds  Pulmonary/Chest: Effort normal and breath sounds normal. No respiratory distress. He has no wheezes. He has no rales.  Musculoskeletal: Normal range of motion. He exhibits tenderness. He exhibits no edema.  Negative swelling, erythema, inflammation, lesions, sores, deformities, malalignment identified to the left shoulder. Negative sunken in appearance. Negative apprehension test. Negative pain upon palpation to the clavicle-negative tenting of the clavicle. Full inversion, eversion, abduction, adduction identified to left shoulder. Full range of motion to the left elbow, left wrist, left digits of the left hand. Negative issues with supination and pronation of the left upper extremity.  Lymphadenopathy:    He has no cervical adenopathy.  Neurological: He is alert and oriented to person, place, and time. No cranial nerve deficit. He exhibits normal muscle tone. Coordination normal.  Skin: Skin is warm. No rash noted. He is not diaphoretic. No erythema.  Psychiatric: He has a normal mood and affect. His behavior is normal. Thought content normal.  Nursing note and vitals reviewed.   ED Course  Procedures (including critical care time) Labs Review Labs  Reviewed  CBG MONITORING, ED - Abnormal; Notable for the following:    Glucose-Capillary 144 (*)    All other components within normal limits    Imaging Review Dg Shoulder Left  08/09/2014   CLINICAL DATA:  LEFT shoulder pain since seizure in May 2015, pain while playing basketball, feels like it is coming out of joint, limited range of motion, diabetes, hypertension, seizure disorder  EXAM: LEFT SHOULDER - 2+ VIEW  COMPARISON:  None  FINDINGS: AC joint alignment normal.  Osseous mineralization normal.  No acute fracture, dislocation or bone destruction.  Os acromiale present.  Visualized LEFT ribs intact.  IMPRESSION: No acute abnormalities.  Incidental note of an os acromiale.   Electronically Signed   By: Ulyses SouthwardMark  Boles M.D.   On: 08/09/2014 16:07     EKG Interpretation None      MDM   Final diagnoses:  Left shoulder pain    Medications - No data to display  Filed Vitals:   08/09/14 1512 08/09/14 1708  BP: 163/92 113/67  Pulse: 65 84  Temp: 98.2 F (36.8 C) 98.7 F (37.1 C)  TempSrc: Oral Oral  Resp: 18 22  SpO2: 100% 99%    CBG 144. Plain film of left shoulder no acute osseous abnormalities identified. Incidental note of an os acromial. Negative findings of dislocation. Negative findings of acute osseous injury. Patient placed in sling immobilizer for comfort purposes. Negative focal neurological deficits noted. Strength intact with equal distribution. Sensation intact. Pulses palpable and strong. Cap refill less than 3 seconds. Cannot rule out possible ligamental injury. Negative signs of ischemia. Negative signs of compartment syndrome. Patient stable, afebrile. Patient not septic appearing. Discharged patient. Discharged patient with referral to PCP and orthopedics. Discussed with patient that he'll need to follow up with orthopedics regarding possible ligamental injury. Discussed with patient to rest, ice, massage. Discussed with patient to avoid any physical strenuous  activity or heavy lifting. Discussed with patient to closely monitor symptoms and if symptoms are to worsen or change to report back to the ED - strict return instructions given.  Patient agreed to plan of care, understood, all questions answered.    Raymon Mutton, PA-C 08/09/14 1722  Flint Melter, MD 08/09/14 [phone]

## 2014-08-09 NOTE — ED Notes (Signed)
Pt made aware to return if symptoms worsen or if any life threatening symptoms occur.   

## 2014-08-09 NOTE — ED Notes (Signed)
Presents with left shoulder pain began in [place] after having a seizure, the pain has been constant and has not changed. He has not been seen for the pain. The pain is worse with rotation of the shoulder and affects his ability to pick up objects with left arm, especially heavy objects. Touch and movement make pain worse.

## 2014-08-09 NOTE — Discharge Instructions (Signed)
Please call your doctor for a followup appointment within 24-48 hours. When you talk to your doctor please let them know that you were seen in the emergency department and have them acquire all of your records so that they can discuss the findings with you and formulate a treatment plan to fully care for your new and ongoing problems. Please follow-up with her primary care provider Please follow-up with orthopedics Keep arm in sling for comfort purposes Please massage with icy hot ointment and avoid any physical or strenuous activity Please rest and stay hydrated Please continue to monitor symptoms closely and if symptoms are to worsen or change (fever greater than 101, chills, sweating, nausea, vomiting, chest pain, shortness of breathe, difficulty breathing, weakness, numbness, tingling, worsening or changes to pain pattern, swelling, loss of sensation, neck pain, neck stiffness, dizziness, visual distortions) please report back to the Emergency Department immediately.   Shoulder Pain The shoulder is the joint that connects your arms to your body. The bones that form the shoulder joint include the upper arm bone (humerus), the shoulder blade (scapula), and the collarbone (clavicle). The top of the humerus is shaped like a ball and fits into a rather flat socket on the scapula (glenoid cavity). A combination of muscles and strong, fibrous tissues that connect muscles to bones (tendons) support your shoulder joint and hold the ball in the socket. Small, fluid-filled sacs (bursae) are located in different areas of the joint. They act as cushions between the bones and the overlying soft tissues and help reduce friction between the gliding tendons and the bone as you move your arm. Your shoulder joint allows a wide range of motion in your arm. This range of motion allows you to do things like scratch your back or throw a ball. However, this range of motion also makes your shoulder more prone to pain from  overuse and injury. Causes of shoulder pain can originate from both injury and overuse and usually can be grouped in the following four categories:  Redness, swelling, and pain (inflammation) of the tendon (tendinitis) or the bursae (bursitis).  Instability, such as a dislocation of the joint.  Inflammation of the joint (arthritis).  Broken bone (fracture). HOME CARE INSTRUCTIONS   Apply ice to the sore area.  Put ice in a plastic bag.  Place a towel between your skin and the bag.  Leave the ice on for 15-20 minutes, 3-4 times per day for the first 2 days, or as directed by your health care provider.  Stop using cold packs if they do not help with the pain.  If you have a shoulder sling or immobilizer, wear it as long as your caregiver instructs. Only remove it to shower or bathe. Move your arm as little as possible, but keep your hand moving to prevent swelling.  Squeeze a soft ball or foam pad as much as possible to help prevent swelling.  Only take over-the-counter or prescription medicines for pain, discomfort, or fever as directed by your caregiver. SEEK MEDICAL CARE IF:   Your shoulder pain increases, or new pain develops in your arm, hand, or fingers.  Your hand or fingers become cold and numb.  Your pain is not relieved with medicines. SEEK IMMEDIATE MEDICAL CARE IF:   Your arm, hand, or fingers are numb or tingling.  Your arm, hand, or fingers are significantly swollen or turn white or blue. MAKE SURE YOU:   Understand these instructions.  Will watch your condition.  Will get help  right away if you are not doing well or get worse. Document Released: 04/04/2005 Document Revised: 11/09/2013 Document Reviewed: 06/09/2011 [facility] Patient Information 2015 [place], [place]. This information is not intended to replace advice given to you by your health care provider. Make sure you discuss any questions you have with your health care provider.

## 2014-08-09 NOTE — ED Notes (Signed)
Pt reports left shoulder pain since last seizure in may 2015.  Pt reports pain while playing basketball, reporting that it feels like its coming out of the joint.  Pt has limited ROM.  Pt alert and oriented.

## 2014-09-16 ENCOUNTER — Other Ambulatory Visit: Payer: Self-pay | Admitting: Internal Medicine

## 2014-09-24 ENCOUNTER — Ambulatory Visit: Payer: Self-pay

## 2014-09-24 ENCOUNTER — Telehealth: Payer: Self-pay | Admitting: *Deleted

## 2014-09-24 NOTE — Telephone Encounter (Signed)
Patient has been without his lisinopril for 2 weeks and presents to clinic today complaining of swollen hands.  Patient vitals are as follows: BP-157/77 Pulse-68 Temp-98.5 O2-98  Patient hands do not appear to be swollen today and he was instructed to restart his lisinopril immediately and to not let his medication run out in the future.  Patient stable and was told to follow up as needed.

## 2014-10-11 ENCOUNTER — Telehealth: Payer: Self-pay | Admitting: *Deleted

## 2014-10-11 NOTE — Telephone Encounter (Signed)
Patient called requesting med refill on Novolog (Dr. Orpah CobbAdvani has never prescribed Novolog to this patient) explained that we could not give a refill on a  Medication that was prescribed elsewhere and that he had a scheduled office visit on 3/18 that he did not show for.  Transferred patient to front office staff to make an appointment

## 2014-10-13 ENCOUNTER — Encounter: Payer: Self-pay | Admitting: Internal Medicine

## 2014-10-13 ENCOUNTER — Ambulatory Visit: Payer: Self-pay | Attending: Internal Medicine | Admitting: Internal Medicine

## 2014-10-13 VITALS — BP 155/86 | HR 62 | Temp 98.0°F | Resp 16

## 2014-10-13 DIAGNOSIS — E785 Hyperlipidemia, unspecified: Secondary | ICD-10-CM | POA: Insufficient documentation

## 2014-10-13 DIAGNOSIS — E139 Other specified diabetes mellitus without complications: Secondary | ICD-10-CM

## 2014-10-13 DIAGNOSIS — R569 Unspecified convulsions: Secondary | ICD-10-CM | POA: Insufficient documentation

## 2014-10-13 DIAGNOSIS — E119 Type 2 diabetes mellitus without complications: Secondary | ICD-10-CM | POA: Insufficient documentation

## 2014-10-13 DIAGNOSIS — G8929 Other chronic pain: Secondary | ICD-10-CM | POA: Insufficient documentation

## 2014-10-13 DIAGNOSIS — I10 Essential (primary) hypertension: Secondary | ICD-10-CM | POA: Insufficient documentation

## 2014-10-13 DIAGNOSIS — M25512 Pain in left shoulder: Secondary | ICD-10-CM | POA: Insufficient documentation

## 2014-10-13 LAB — POCT GLYCOSYLATED HEMOGLOBIN (HGB A1C): Hemoglobin A1C: 9.5

## 2014-10-13 LAB — COMPLETE METABOLIC PANEL WITH GFR
ALT: 15 U/L (ref 0–53)
AST: 24 U/L (ref 0–37)
Albumin: 4.5 g/dL (ref 3.5–5.2)
Alkaline Phosphatase: 47 U/L (ref 39–117)
BILIRUBIN TOTAL: 0.6 mg/dL (ref 0.2–1.2)
BUN: 17 mg/dL (ref 6–23)
CO2: 29 mEq/L (ref 19–32)
Calcium: 9.8 mg/dL (ref 8.4–10.5)
Chloride: 105 mEq/L (ref 96–112)
Creat: 1.14 mg/dL (ref 0.50–1.35)
GFR, Est African American: >89 mL/min
GFR, Est Non African American: 82 mL/min
Glucose, Bld: 91 mg/dL (ref 70–99)
Potassium: 4.7 mEq/L (ref 3.5–5.3)
Sodium: 142 mEq/L (ref 135–145)
Total Protein: 7.2 g/dL (ref 6.0–8.3)

## 2014-10-13 LAB — LIPID PANEL
Cholesterol: 199 mg/dL (ref 0–200)
HDL: 65 mg/dL (ref >=40)
LDL Cholesterol: 121 mg/dL — ABNORMAL HIGH (ref 0–99)
Total CHOL/HDL Ratio: 3.1 Ratio
Triglycerides: 67 mg/dL (ref <150)
VLDL: 13 mg/dL (ref 0–40)

## 2014-10-13 LAB — GLUCOSE, POCT (MANUAL RESULT ENTRY): POC Glucose: 147 mg/dl — AB (ref 70–99)

## 2014-10-13 MED ORDER — INSULIN ASPART 100 UNIT/ML ~~LOC~~ SOLN: 4.0000 [IU] | Freq: Three times a day (TID) | SUBCUTANEOUS | Status: DC

## 2014-10-13 MED ORDER — IBUPROFEN 600 MG PO TABS: 600.0000 mg | ORAL_TABLET | Freq: Three times a day (TID) | ORAL | Status: DC | PRN

## 2014-10-13 NOTE — Progress Notes (Signed)
MRN: [id number] Name: [name]  Sex: male Age: 39 y.o. DOB: 1976-01-21  Allergies: Ziprasidone mesylate  Chief Complaint  Patient presents with  . Follow-up    HPI: Patient is 39 y.o. male who has history of diabetes hypertension hyperkalemia, seizure disorder, recently went to the emergency room with symptoms of chronic left shoulder pain, patient was advised to schedule appointment with orthopedics, currently his pain is minimal and is worse with certain movements, sometimes he takes Aleve which helps him with the symptoms, today his blood pressure is borderline elevated denies any headache dizziness chest and shortness of breath, as per patient he has not taken his blood pressure medication today his hemoglobin A1c has trended up, as per patient is taking Levemir 15 units in the morning and 12 units at night, on the last visit he was advised to increase the dose to 15 minutes 2 times a day, she's taking NovoLog as sliding scale.also the last visit his Zocor was increased to 40 mg.  Past Medical History  Diagnosis Date  . Diabetes mellitus   . Seizures   . Hypertension     Past Surgical History  Procedure Laterality Date  . Hernia repair        Medication List       This list is accurate as of: 10/13/14  4:28 PM.  Always use your most recent med list.               amoxicillin-clavulanate 875-125 MG per tablet  Commonly known as:  AUGMENTIN  Take 1 tablet by mouth 2 (two) times daily.     ARIPiprazole 5 MG tablet  Commonly known as:  ABILIFY  Take 7.5 mg by mouth every morning.     aspirin 325 MG tablet  Take 325 mg by mouth daily.     divalproex 500 MG 24 hr tablet  Commonly known as:  DEPAKOTE ER  Take 3 tablets (1,500 mg total) by mouth daily.     ibuprofen 600 MG tablet  Commonly known as:  ADVIL,MOTRIN  Take 1 tablet (600 mg total) by mouth every 8 (eight) hours as needed.     insulin aspart 100 UNIT/ML injection  Commonly known as:  novoLOG    Inject 4 Units into the skin 3 (three) times daily before meals. Sliding scale as directed. Pt states if his blood sugar is >170 add 2 units.     Insulin Detemir 100 UNIT/ML Pen  Commonly known as:  LEVEMIR FLEXPEN  Inject into the skin BID.  15 units in the morning and 12 units in the evening     insulin detemir 100 UNIT/ML injection  Commonly known as:  LEVEMIR  Inject 0.12-0.15 mLs (12-15 Units total) into the skin 2 (two) times daily. 15 units in the morning and 12 units in the evening     lisinopril 10 MG tablet  Commonly known as:  PRINIVIL,ZESTRIL  TAKE 1 TABLET BY MOUTH EVERY MORNING.     simvastatin 20 MG tablet  Commonly known as:  ZOCOR  Take 1 tablet (20 mg total) by mouth daily.     simvastatin 40 MG tablet  Commonly known as:  ZOCOR  Take 1 tablet (40 mg total) by mouth at bedtime.     traZODone 100 MG tablet  Commonly known as:  DESYREL  Take 200 mg by mouth at bedtime.        Meds ordered this encounter  Medications  . insulin aspart (NOVOLOG) 100  UNIT/ML injection    Sig: Inject 4 Units into the skin 3 (three) times daily before meals. Sliding scale as directed. Pt states if his blood sugar is >170 add 2 units.    Dispense:  10 mL    Refill:  6  . ibuprofen (ADVIL,MOTRIN) 600 MG tablet    Sig: Take 1 tablet (600 mg total) by mouth every 8 (eight) hours as needed.    Dispense:  30 tablet    Refill:  1    Immunization History  Administered Date(s) Administered  . Influenza Whole 03/24/2008, 05/05/2009  . Influenza,inj,Quad PF,36+ Mos 04/01/2014  . Pneumococcal Polysaccharide-23 07/10/2007  . Td 04/04/2007    Family History  Problem Relation Age of Onset  . Hypertension Mother   . Diabetes Paternal Uncle     History  Substance Use Topics  . Smoking status: Never Smoker   . Smokeless tobacco: Not on file  . Alcohol Use: No     Comment: rarely    Review of Systems   As noted in HPI  Filed Vitals:   10/13/14 1420  BP: 155/86  Pulse:  62  Temp: 98 F (36.7 C)  Resp: 16    Physical Exam  Physical Exam  Constitutional: He is oriented to person, place, and time. No distress.  Eyes: EOM are normal. Pupils are equal, round, and reactive to light.  Neck: Neck supple.  Cardiovascular: Normal rate and regular rhythm.   Pulmonary/Chest: Breath sounds normal. No respiratory distress. He has no wheezes. He has no rales.  Musculoskeletal:  Left shoulder good range of motion minimal tenderness anteriorly  Neurological: He is alert and oriented to person, place, and time.    CBC    Component Value Date/Time   WBC 5.9 03/10/2014 1139   RBC 4.92 03/10/2014 1139   HGB 14.1 03/10/2014 1139   HCT 42.0 03/10/2014 1139   PLT 255 03/10/2014 1139   MCV 85.4 03/10/2014 1139   LYMPHSABS 2.1 03/10/2014 1139   MONOABS 0.3 03/10/2014 1139   EOSABS 0.3 03/10/2014 1139   BASOSABS 0.0 03/10/2014 1139    CMP     Component Value Date/Time   NA 135 07/07/2014 1007   K 4.9 07/07/2014 1007   CL 97 07/07/2014 1007   CO2 28 07/07/2014 1007   GLUCOSE 277* 07/07/2014 1007   BUN 20 07/07/2014 1007   CREATININE 1.43* 07/07/2014 1007   CREATININE 1.46* 03/04/2014 0830   CALCIUM 9.7 07/07/2014 1007   PROT 7.2 07/07/2014 1007   ALBUMIN 4.3 07/07/2014 1007   AST 22 07/07/2014 1007   ALT 19 07/07/2014 1007   ALKPHOS 57 07/07/2014 1007   BILITOT 0.5 07/07/2014 1007   GFRNONAA 63 07/07/2014 1007   GFRNONAA 60* 03/04/2014 0830   GFRAA 72 07/07/2014 1007   GFRAA 70* 03/04/2014 0830    Lab Results  Component Value Date/Time   CHOL 247* 07/07/2014 10:07 AM    No components found for: HGA1C  Lab Results  Component Value Date/Time   AST 22 07/07/2014 10:07 AM    Assessment and Plan  Other specified diabetes mellitus without complications - Plan:  Results for orders placed or performed in visit on 10/13/14  Glucose (CBG)  Result Value Ref Range   POC Glucose 147 (A) 70 - 99 mg/dl  HgB U0A  Result Value Ref Range    Hemoglobin A1C 9.50    , HgB A1c has trended up, he will increase the dose of Levemir to 15 units 2  times a day, also continue with, insulin aspart (NOVOLOG) 100 UNIT/ML injection sliding scale, will repeat A1c in 3 months.  Essential hypertension, benign - Plan:blood pressure is borderline elevated as per patient is not taking his blood pressure medication, continue with lisinopril 10 mg daily, recheck blood chemistry COMPLETE METABOLIC PANEL WITH GFR  Chronic left shoulder pain - Plan: ibuprofen (ADVIL,MOTRIN) 600 MG tablet  Hyperlipidemia - Plan:currently patient is on Zocor 40 mg, recheck Lipid panel  Convulsions, unspecified convulsion type Patient is on Depakote, denies any recent seizures.   Return in about 3 months (around 01/12/2015), or if symptoms worsen or fail to improve.   This note has been created with Education officer, [employer]Dragon speech recognition software and smart phrase technology. Any transcriptional errors are unintentional.    Doris CheadleADVANI, Raylin Winer, MD

## 2014-10-13 NOTE — Patient Instructions (Signed)
Diabetes Mellitus and Food It is important for you to manage your blood sugar (glucose) level. Your blood glucose level can be greatly affected by what you eat. Eating healthier foods in the appropriate amounts throughout the day at about the same time each day will help you control your blood glucose level. It can also help slow or prevent worsening of your diabetes mellitus. Healthy eating may even help you improve the level of your blood pressure and reach or maintain a healthy weight.  HOW CAN FOOD AFFECT ME? Carbohydrates Carbohydrates affect your blood glucose level more than any other type of food. Your dietitian will help you determine how many carbohydrates to eat at each meal and teach you how to count carbohydrates. Counting carbohydrates is important to keep your blood glucose at a healthy level, especially if you are using insulin or taking certain medicines for diabetes mellitus. Alcohol Alcohol can cause sudden decreases in blood glucose (hypoglycemia), especially if you use insulin or take certain medicines for diabetes mellitus. Hypoglycemia can be a life-threatening condition. Symptoms of hypoglycemia (sleepiness, dizziness, and disorientation) are similar to symptoms of having too much alcohol.  If your health care provider has given you approval to drink alcohol, do so in moderation and use the following guidelines:  Women should not have more than one drink per day, and men should not have more than two drinks per day. One drink is equal to:  12 oz of beer.  5 oz of wine.  1 oz of hard liquor.  Do not drink on an empty stomach.  Keep yourself hydrated. Have water, diet soda, or unsweetened iced tea.  Regular soda, juice, and other mixers might contain a lot of carbohydrates and should be counted. WHAT FOODS ARE NOT RECOMMENDED? As you make food choices, it is important to remember that all foods are not the same. Some foods have fewer nutrients per serving than other  foods, even though they might have the same number of calories or carbohydrates. It is difficult to get your body what it needs when you eat foods with fewer nutrients. Examples of foods that you should avoid that are high in calories and carbohydrates but low in nutrients include:  Trans fats (most processed foods list trans fats on the Nutrition Facts label).  Regular soda.  Juice.  Candy.  Sweets, such as cake, pie, doughnuts, and cookies.  Fried foods. WHAT FOODS CAN I EAT? Have nutrient-rich foods, which will nourish your body and keep you healthy. The food you should eat also will depend on several factors, including:  The calories you need.  The medicines you take.  Your weight.  Your blood glucose level.  Your blood pressure level.  Your cholesterol level. You also should eat a variety of foods, including:  Protein, such as meat, poultry, fish, tofu, nuts, and seeds (lean animal proteins are best).  Fruits.  Vegetables.  Dairy products, such as milk, cheese, and yogurt (low fat is best).  Breads, grains, pasta, cereal, rice, and beans.  Fats such as olive oil, trans fat-free margarine, canola oil, avocado, and olives. DOES EVERYONE WITH DIABETES MELLITUS HAVE THE SAME MEAL PLAN? Because every person with diabetes mellitus is different, there is not one meal plan that works for everyone. It is very important that you meet with a dietitian who will help you create a meal plan that is just right for you. Document Released: 03/22/2005 Document Revised: 06/30/2013 Document Reviewed: 05/22/2013 ExitCare Patient Information 2015 [employer]. This   information is not intended to replace advice given to you by your health care provider. Make sure you discuss any questions you have with your health care provider. DASH Eating Plan DASH stands for "Dietary Approaches to Stop Hypertension." The DASH eating plan is a healthy eating plan that has been shown to reduce high  blood pressure (hypertension). Additional health benefits may include reducing the risk of type 2 diabetes mellitus, heart disease, and stroke. The DASH eating plan may also help with weight loss. WHAT DO I NEED TO KNOW ABOUT THE DASH EATING PLAN? For the DASH eating plan, you will follow these general guidelines:  Choose foods with a percent daily value for sodium of less than 5% (as listed on the food label).  Use salt-free seasonings or herbs instead of table salt or sea salt.  Check with your health care provider or pharmacist before using salt substitutes.  Eat lower-sodium products, often labeled as "lower sodium" or "no salt added."  Eat fresh foods.  Eat more vegetables, fruits, and low-fat dairy products.  Choose whole grains. Look for the word "whole" as the first word in the ingredient list.  Choose fish and skinless chicken or turkey more often than red meat. Limit fish, poultry, and meat to 6 oz (170 g) each day.  Limit sweets, desserts, sugars, and sugary drinks.  Choose heart-healthy fats.  Limit cheese to 1 oz (28 g) per day.  Eat more home-cooked food and less restaurant, buffet, and fast food.  Limit fried foods.  Cook foods using methods other than frying.  Limit canned vegetables. If you do use them, rinse them well to decrease the sodium.  When eating at a restaurant, ask that your food be prepared with less salt, or no salt if possible. WHAT FOODS CAN I EAT? Seek help from a dietitian for individual calorie needs. Grains Whole grain or whole wheat bread. Brown rice. Whole grain or whole wheat pasta. Quinoa, bulgur, and whole grain cereals. Low-sodium cereals. Corn or whole wheat flour tortillas. Whole grain cornbread. Whole grain crackers. Low-sodium crackers. Vegetables Fresh or frozen vegetables (raw, steamed, roasted, or grilled). Low-sodium or reduced-sodium tomato and vegetable juices. Low-sodium or reduced-sodium tomato sauce and paste. Low-sodium  or reduced-sodium canned vegetables.  Fruits All fresh, canned (in natural juice), or frozen fruits. Meat and Other Protein Products Ground beef (85% or leaner), grass-fed beef, or beef trimmed of fat. Skinless chicken or turkey. Ground chicken or turkey. Pork trimmed of fat. All fish and seafood. Eggs. Dried beans, peas, or lentils. Unsalted nuts and seeds. Unsalted canned beans. Dairy Low-fat dairy products, such as skim or 1% milk, 2% or reduced-fat cheeses, low-fat ricotta or cottage cheese, or plain low-fat yogurt. Low-sodium or reduced-sodium cheeses. Fats and Oils Tub margarines without trans fats. Light or reduced-fat mayonnaise and salad dressings (reduced sodium). Avocado. Safflower, olive, or canola oils. Natural peanut or almond butter. Other Unsalted popcorn and pretzels. The items listed above may not be a complete list of recommended foods or beverages. Contact your dietitian for more options. WHAT FOODS ARE NOT RECOMMENDED? Grains White bread. White pasta. White rice. Refined cornbread. Bagels and croissants. Crackers that contain trans fat. Vegetables Creamed or fried vegetables. Vegetables in a cheese sauce. Regular canned vegetables. Regular canned tomato sauce and paste. Regular tomato and vegetable juices. Fruits Dried fruits. Canned fruit in light or heavy syrup. Fruit juice. Meat and Other Protein Products Fatty cuts of meat. Ribs, chicken wings, bacon, sausage, bologna, salami, chitterlings, fatback, hot   dogs, bratwurst, and packaged luncheon meats. Salted nuts and seeds. Canned beans with salt. Dairy Whole or 2% milk, cream, half-and-half, and cream cheese. Whole-fat or sweetened yogurt. Full-fat cheeses or blue cheese. Nondairy creamers and whipped toppings. Processed cheese, cheese spreads, or cheese curds. Condiments Onion and garlic salt, seasoned salt, table salt, and sea salt. Canned and packaged gravies. Worcestershire sauce. Tartar sauce. Barbecue sauce.  Teriyaki sauce. Soy sauce, including reduced sodium. Steak sauce. Fish sauce. Oyster sauce. Cocktail sauce. Horseradish. Ketchup and mustard. Meat flavorings and tenderizers. Bouillon cubes. Hot sauce. Tabasco sauce. Marinades. Taco seasonings. Relishes. Fats and Oils Butter, stick margarine, lard, shortening, ghee, and bacon fat. Coconut, palm kernel, or palm oils. Regular salad dressings. Other Pickles and olives. Salted popcorn and pretzels. The items listed above may not be a complete list of foods and beverages to avoid. Contact your dietitian for more information. WHERE CAN I FIND MORE INFORMATION? National Heart, Lung, and [employer] Institute: www.nhlbi.nih.gov/health/health-topics/topics/dash/ Document Released: 06/14/2011 Document Revised: 11/09/2013 Document Reviewed: 04/29/2013 ExitCare Patient Information 2015 [employer]. This information is not intended to replace advice given to you by your health care provider. Make sure you discuss any questions you have with your health care provider.  

## 2014-10-13 NOTE — Progress Notes (Signed)
Patient here for follow up on his diabetes Was seen in the ED for left  Shoulder pain and will need a referral To orthopedics

## 2014-10-15 ENCOUNTER — Telehealth: Payer: Self-pay

## 2014-10-15 NOTE — Telephone Encounter (Signed)
-----   Message from Doris Cheadleeepak Advani, MD sent at 10/14/2014  9:21 AM EDT ----- Call and let the patient know that his kidney function is improved, as well as his cholesterol is improved, still bad cholesterol/LDL is borderline elevated, will continue with current dose of statin Zocor, advise patient for low fat diet, will repeat fasting lipid panel on the next visit.

## 2014-10-15 NOTE — Telephone Encounter (Signed)
Patient is aware of his lab results 

## 2014-12-20 ENCOUNTER — Other Ambulatory Visit: Payer: Self-pay | Admitting: Internal Medicine

## 2014-12-29 ENCOUNTER — Telehealth: Payer: Self-pay | Admitting: Internal Medicine

## 2014-12-29 MED ORDER — LISINOPRIL 10 MG PO TABS: 10.0000 mg | ORAL_TABLET | Freq: Every morning | ORAL | Status: DC

## 2014-12-29 NOTE — Telephone Encounter (Signed)
Patient called requesting medication refill on lisinopril (PRINIVIL,ZESTRIL) 10 MG tablet. Please f/u

## 2015-03-10 ENCOUNTER — Encounter: Payer: Self-pay | Admitting: Family Medicine

## 2015-03-10 ENCOUNTER — Ambulatory Visit: Payer: Self-pay | Attending: Family Medicine | Admitting: Family Medicine

## 2015-03-10 VITALS — BP 164/78 | HR 71 | Temp 98.6°F | Resp 16 | Ht 71.0 in | Wt 182.0 lb

## 2015-03-10 DIAGNOSIS — E1069 Type 1 diabetes mellitus with other specified complication: Secondary | ICD-10-CM | POA: Insufficient documentation

## 2015-03-10 DIAGNOSIS — E108 Type 1 diabetes mellitus with unspecified complications: Secondary | ICD-10-CM

## 2015-03-10 DIAGNOSIS — R569 Unspecified convulsions: Secondary | ICD-10-CM

## 2015-03-10 DIAGNOSIS — I10 Essential (primary) hypertension: Secondary | ICD-10-CM

## 2015-03-10 DIAGNOSIS — E785 Hyperlipidemia, unspecified: Secondary | ICD-10-CM

## 2015-03-10 DIAGNOSIS — F3130 Bipolar disorder, current episode depressed, mild or moderate severity, unspecified: Secondary | ICD-10-CM

## 2015-03-10 DIAGNOSIS — G40909 Epilepsy, unspecified, not intractable, without status epilepticus: Secondary | ICD-10-CM | POA: Insufficient documentation

## 2015-03-10 LAB — COMPLETE METABOLIC PANEL WITH GFR
ALK PHOS: 42 U/L (ref 40–115)
ALT: 14 U/L (ref 9–46)
AST: 20 U/L (ref 10–40)
Albumin: 4.3 g/dL (ref 3.6–5.1)
BILIRUBIN TOTAL: 0.7 mg/dL (ref 0.2–1.2)
BUN: 18 mg/dL (ref 7–25)
CALCIUM: 9.2 mg/dL (ref 8.6–10.3)
CO2: 26 mmol/L (ref 20–31)
CREATININE: 1.33 mg/dL (ref 0.60–1.35)
Chloride: 98 mmol/L (ref 98–110)
GFR, Est African American: 78 mL/min (ref >=60)
GFR, Est Non African American: 68 mL/min (ref >=60)
Glucose, Bld: 604 mg/dL (ref 65–99)
POTASSIUM: 5.1 mmol/L (ref 3.5–5.3)
Sodium: 134 mmol/L — ABNORMAL LOW (ref 135–146)
TOTAL PROTEIN: 6.6 g/dL (ref 6.1–8.1)

## 2015-03-10 LAB — CBC
HCT: 42.6 % (ref 39.0–52.0)
HEMOGLOBIN: 13.4 g/dL (ref 13.0–17.0)
MCH: 28.5 pg (ref 26.0–34.0)
MCHC: 31.5 g/dL (ref 30.0–36.0)
MCV: 90.4 fL (ref 78.0–100.0)
MPV: 11 fL (ref 8.6–12.4)
Platelets: 214 10*3/uL (ref 150–400)
RBC: 4.71 MIL/uL (ref 4.22–5.81)
RDW: 12.9 % (ref 11.5–15.5)
WBC: 4.1 10*3/uL (ref 4.0–10.5)

## 2015-03-10 LAB — POCT GLYCOSYLATED HEMOGLOBIN (HGB A1C): Hemoglobin A1C: 8.6

## 2015-03-10 LAB — POCT URINALYSIS DIPSTICK
BILIRUBIN UA: NEGATIVE
Blood, UA: NEGATIVE
GLUCOSE UA: 500
Ketones, UA: NEGATIVE
LEUKOCYTES UA: NEGATIVE
Nitrite, UA: NEGATIVE
Protein, UA: 30
Spec Grav, UA: <=1.005
Urobilinogen, UA: 0.2
pH, UA: 5.5

## 2015-03-10 LAB — GLUCOSE, POCT (MANUAL RESULT ENTRY)
POC Glucose: 540 mg/dl — AB (ref 70–99)
POC Glucose: 569 mg/dl — AB (ref 70–99)

## 2015-03-10 MED ORDER — GLUCOSE BLOOD VI STRP: 1.0000 | ORAL_STRIP | Freq: Three times a day (TID) | Status: DC

## 2015-03-10 MED ORDER — DIVALPROEX SODIUM ER 500 MG PO TB24: 1000.0000 mg | ORAL_TABLET | Freq: Every day | ORAL | Status: DC

## 2015-03-10 MED ORDER — LISINOPRIL 10 MG PO TABS: 10.0000 mg | ORAL_TABLET | Freq: Every morning | ORAL | Status: DC

## 2015-03-10 MED ORDER — GLUCAGON (RDNA) 1 MG IJ KIT: 1.0000 mg | PACK | Freq: Once | INTRAMUSCULAR | Status: DC | PRN

## 2015-03-10 MED ORDER — INSULIN GLARGINE 100 UNIT/ML SOLOSTAR PEN: 25.0000 [IU] | PEN_INJECTOR | Freq: Every day | SUBCUTANEOUS | Status: DC

## 2015-03-10 MED ORDER — INSULIN ASPART 100 UNIT/ML ~~LOC~~ SOLN: 2.0000 [IU] | Freq: Three times a day (TID) | SUBCUTANEOUS | Status: DC

## 2015-03-10 MED ORDER — SIMVASTATIN 20 MG PO TABS: 20.0000 mg | ORAL_TABLET | Freq: Every day | ORAL | Status: DC

## 2015-03-10 MED ORDER — TRUEPLUS LANCETS 28G MISC: 1.0000 | Freq: Three times a day (TID) | Status: DC

## 2015-03-10 MED ORDER — INSULIN ASPART 100 UNIT/ML ~~LOC~~ SOLN
10.0000 [IU] | Freq: Once | SUBCUTANEOUS | Status: AC
  Administered 2015-03-10: 10 [IU] via SUBCUTANEOUS

## 2015-03-10 MED ORDER — TRUE METRIX METER W/DEVICE KIT: 1.0000 | PACK | Status: DC | PRN

## 2015-03-10 NOTE — Assessment & Plan Note (Addendum)
IDDM type 1with labile blood sugars including symptomatic hypoglycemia  lantus 25 U novolog 2 U with meals only if sugars are > 120  Endocrinology referral  Diabetes blood sugar goals  Fasting (in AM before breakfast, 8 hrs of no eating or drinking (except water or unsweetened coffee or tea): 90-110 2 hrs after meals: < 160,   No low sugars: nothing < 70

## 2015-03-10 NOTE — Assessment & Plan Note (Signed)
A: Bipolar depression: w/o SI or mania. No pharmacotherapy  P: I recommend therapy. I highly recommend [facility] [place].

## 2015-03-10 NOTE — Progress Notes (Signed)
Establishing care with PCP F/U DM  Glucose been running mostly low   Requesting insulting pump Pain scale #3  Chest pain on and off x 2 month  No hx tobacco

## 2015-03-10 NOTE — Progress Notes (Signed)
Subjective:    Patient ID: [name], male    DOB: [birth year], 39 y.o.   MRN: [id number] CC: f/u IDDM type 1, seizures  HPI  1. CHRONIC DIABETES dx at age 72. Having labile CBGs for past 3 weeks. Sugar down to 37. Called EMS 2 weeks ago and was treated at home. He is working more in the past 3 weeks. Sugar was low this AM at 37. He has not taking novolog or levemir today.   Disease Monitoring  Blood Sugar Ranges: 37-540   Polyuria: yes, today    Visual problems: no   Medication Compliance: yes, compliant with levemir as written. Taking 2 U of novolog prn.   Medication Side Effects  Hypoglycemia: yes   2. Seizures: dx in 2012. Induced for lack of sleep and heat. Last seizure one year ago. Taking depakote 500 mg BID. Was not able to see neurology last year due to lack of money for the co-pay.   3. Bipolar depression: not taking abilify or trazodone. Here today with his girlfriend who endorses his depression. Patient also endorses depression and anxiety. Anxiety is worse when CBG is low.  He denies SI. He is amenable to counseling.   Social History  Substance Use Topics  . Smoking status: Never Smoker   . Smokeless tobacco: Not on file  . Alcohol Use: No     Comment: rarely    Review of Systems  Constitutional: Negative for fever, chills, fatigue and unexpected weight change.  HENT: Negative for dental problem.   Eyes: Negative for visual disturbance.  Respiratory: Negative for cough and shortness of breath.   Cardiovascular: Negative for chest pain, palpitations and leg swelling.  Gastrointestinal: Negative for nausea, vomiting, abdominal pain, diarrhea, constipation and blood in stool.  Endocrine: Positive for polyuria. Negative for polydipsia and polyphagia.  Musculoskeletal: Negative for myalgias, back pain, arthralgias, gait problem and neck pain.  Skin: Negative for rash.  Allergic/Immunologic: Negative for immunocompromised state.  Neurological: Negative for seizures.   Hematological: Negative for adenopathy. Does not bruise/bleed easily.  Psychiatric/Behavioral: Positive for dysphoric mood. Negative for suicidal ideas and sleep disturbance. The patient is nervous/anxious.   GAD-7: 19. 2-1,7. 3-all others      Objective:   Physical Exam  Constitutional: He appears well-developed and well-nourished. No distress.  HENT:  Mouth/Throat: Oropharynx is clear and moist.    Neck: Normal range of motion. Neck supple.  Cardiovascular: Normal rate, regular rhythm, normal heart sounds and intact distal pulses.   Pulmonary/Chest: Effort normal and breath sounds normal.  Musculoskeletal: He exhibits no edema.  Neurological: He is alert.  Skin: Skin is warm and dry. No rash noted. No erythema.  Psychiatric: He has a normal mood and affect.  BP 164/78 mmHg  Pulse 71  Temp(Src) 98.6 F (37 C) (Oral)  Resp 16  Ht 5' 11"  (1.803 m)  Wt 182 lb (82.555 kg)  BMI 25.40 kg/m2  SpO2 100% Wt Readings from Last 3 Encounters:  03/10/15 182 lb (82.555 kg)  09/24/14 181 lb (82.101 kg)  07/07/14 180 lb 3.2 oz (81.738 kg)   Lab Results  Component Value Date   HGBA1C 8.60 03/10/2015   Depression screen PHQ 2/9 03/10/2015  Decreased Interest 2  Down, Depressed, Hopeless 0  PHQ - 2 Score 2  Altered sleeping 3  Tired, decreased energy 1  Change in appetite 2  Feeling bad or failure about yourself  2  Trouble concentrating 2  Moving slowly or fidgety/restless 0  Suicidal thoughts 0  PHQ-9 Score 12     CBG 540 UA: 500 glucose, no ketones   Treated with 10 U of novolog  Repeat CBG 569    Assessment & Plan:  [name] was seen today for establish care and numbness.  Diagnoses and all orders for this visit:  DM (diabetes mellitus), type 1 with complications -     POCT glucose (manual entry) -     POCT glycosylated hemoglobin (Hb A1C) -     Microalbumin, urine -     POCT urinalysis dipstick -     insulin aspart (novoLOG) injection 10 Units; Inject 0.1 mLs (10  Units total) into the skin once. -     CBC -     COMPLETE METABOLIC PANEL WITH GFR -     glucose blood (TRUE METRIX BLOOD GLUCOSE TEST) test strip; 1 each by Other route 3 (three) times daily. -     Blood Glucose Monitoring Suppl (TRUE METRIX METER) W/DEVICE KIT; 1 each by Does not apply route as needed. -     TRUEPLUS LANCETS 28G MISC; 1 each by Does not apply route 3 (three) times daily. -     Insulin Glargine (LANTUS SOLOSTAR) 100 UNIT/ML Solostar Pen; Inject 25 Units into the skin daily at 10 pm. -     Ambulatory referral to Endocrinology -     insulin aspart (NOVOLOG) 100 UNIT/ML injection; Inject 2 Units into the skin 3 (three) times daily before meals. If sugar > 120 -     Glucose (CBG) -     glucagon 1 MG injection; Inject 1 mg into the muscle once as needed.  Seizures -     Valproic acid level -     Ambulatory referral to Neurology -     divalproex (DEPAKOTE ER) 500 MG 24 hr tablet; Take 2 tablets (1,000 mg total) by mouth daily.  Hyperlipidemia -     simvastatin (ZOCOR) 20 MG tablet; Take 1 tablet (20 mg total) by mouth daily.  Essential hypertension, benign -     lisinopril (PRINIVIL,ZESTRIL) 10 MG tablet; Take 1 tablet (10 mg total) by mouth every morning.  Bipolar disorder, most recent episode depressed, remission status unspecified  [facility] for counseling

## 2015-03-10 NOTE — Assessment & Plan Note (Signed)
A: Seizure disorder  P: Depakote 1000 mg once daily Neurology referral  Please apply for [facility] discount and [facility] card, you can also inquire if any of your medications are on the PASS (medications assistance) list.

## 2015-03-10 NOTE — Patient Instructions (Addendum)
[name],  Thank you for coming in today. It was a pleasure meeting you. I look forward to being your primary doctor.   1. Diabetes with labile blood sugars lantus 25 U novolog 2 U with meals only if sugars are > 120  Endocrinology referral  Diabetes blood sugar goals  Fasting (in AM before breakfast, 8 hrs of no eating or drinking (except water or unsweetened coffee or tea): 90-110 2 hrs after meals: < 160,   No low sugars: nothing < 70    2. Seizure disorder depakote 1000 mg once daily Neurology referral  Please apply for [facility] discount and [facility] card, you can also inquire if any of your medications are on the PASS (medications assistance) list.   3. Bipolar depression: I recommend therapy. I highly recommend [facility] the [place], then [place].   F/u in 3 weeks for RN CBG check and flu shot F/u with me in 2 months   Dr. Armen Pickup for diabetes

## 2015-03-11 ENCOUNTER — Telehealth: Payer: Self-pay | Admitting: Family Medicine

## 2015-03-11 LAB — VALPROIC ACID LEVEL: VALPROIC ACID LVL: 31.2 ug/mL — AB (ref 50.0–100.0)

## 2015-03-11 LAB — MICROALBUMIN, URINE: Microalb, Ur: 17.6 mg/dL — ABNORMAL HIGH (ref <2.0)

## 2015-03-11 NOTE — Telephone Encounter (Signed)
-----   Message from Dessa Phi, MD sent at 03/11/2015  9:13 AM EDT ----- CMP with hyperglycemia and sub therapeutic Depakote level  Patient seizure free, no  Changes Patient treated in house for hyperglycemia. Please call patient this AM to check on sugar.

## 2015-03-11 NOTE — Telephone Encounter (Signed)
Called patient left VM Calling to check in and give lab results, see below. Requested call back to [phone]

## 2015-04-07 ENCOUNTER — Ambulatory Visit: Payer: Self-pay | Attending: Internal Medicine | Admitting: Pharmacist

## 2015-04-07 ENCOUNTER — Encounter (INDEPENDENT_AMBULATORY_CARE_PROVIDER_SITE_OTHER): Payer: Self-pay

## 2015-04-07 DIAGNOSIS — E108 Type 1 diabetes mellitus with unspecified complications: Secondary | ICD-10-CM

## 2015-04-07 DIAGNOSIS — Z23 Encounter for immunization: Secondary | ICD-10-CM

## 2015-04-07 MED ORDER — INSULIN GLARGINE 100 UNIT/ML SOLOSTAR PEN: 20.0000 [IU] | PEN_INJECTOR | Freq: Every day | SUBCUTANEOUS | Status: DC

## 2015-04-07 MED ORDER — INFLUENZA VAC SPLIT QUAD 0.5 ML IM SUSY
0.5000 mL | PREFILLED_SYRINGE | INTRAMUSCULAR | Status: AC
  Administered 2015-04-07: 0.5 mL via INTRAMUSCULAR

## 2015-04-07 NOTE — Progress Notes (Signed)
S:    Patient arrives in a pleasant mood with his girlfriend, [name]. Presents for diabetes management. Patient has long-standing T1DM.   Patient denies adherence with medications as he has not picked up his new medications. Current diabetes medications include Lantus 25 units daily and Novolog per sliding scale.  Patient reports hypoglycemic events. Has at least one call to EMS per month for severe hypoglycemia.  Patient reported dietary habits: Maintains a good diet and is knowledgeable about carb counting.  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   Patient reports nocturia when BG elevated. Patient denies neuropathy. Patient denies visual changes. Patient denies self foot exams.    O:  Lab Results  Component Value Date   HGBA1C 8.60 03/10/2015    Did not bring BG logs to visit but reports he checks 4 times a day.  A/P: Long-standing T1DM currently uncontrolled based on A1c of 8.6 and episodes of hypoglycemia. Patient reports hypoglycemic events and is able to verbalize appropriate hypoglycemia management plan. Patient denies adherence with medication due to the fact he has not picked up his Lantus from pharmacy. Control is suboptimal due to poor adherence. States he has had multiple episodes of hypoglycemia in the past month with at least one call to EMS, likely a result of indiscriminate insulin use. Pt generally tries to correct BG with his best guess of insulin needed. Reports seeing BG values of 70-150 upon waking and around 150-200 throughout the day however, difficult to accurately assess level of glycemic control. Discussed at length that if a insulin pump was his goal, then his adherence must improve. Will adjust Lantus to 20 units daily along with 2 units of Novolog with meals due to concerns of hypoglycemia. Instructed patient to not correct with any more than 3 units maximum and to get log BG values for one week and bring to next  appointment.  Flu vaccine also administered during visit.  Next A1C anticipated December.    Written patient instructions provided.  Total time in face to face counseling 40 minutes. Follow up in Pharmac[facility] Clinic in one week. Patient seen with [name], PharmD resident.

## 2015-04-07 NOTE — Patient Instructions (Signed)
Start your lantus at 20 units daily  Start taking novolog 2 units with meals. Do not use if blood sugar less than 130.  Monitor your blood sugar each day for 1 week upon waking up. Get AT LEAST one other value throughout the day. Bring your log to your next appointment.  Follow up in one week at the pharmacy clinic.  Influenza Virus Vaccine injection What is this medicine? INFLUENZA VIRUS VACCINE (in floo EN zuh VAHY ruhs vak SEEN) helps to reduce the risk of getting influenza also known as the flu. The vaccine only helps protect you against some strains of the flu. This medicine may be used for other purposes; ask your health care provider or pharmacist if you have questions. COMMON BRAND NAME(S): Afluria, Agriflu, Fluarix, Fluarix Quadrivalent, FLUCELVAX, Flulaval, Fluvirin, Fluzone, Fluzone High-Dose, Fluzone Intradermal What should I tell my health care provider before I take this medicine? They need to know if you have any of these conditions: -bleeding disorder like hemophilia -fever or infection -Guillain-Barre syndrome or other neurological problems -immune system problems -infection with the human immunodeficiency virus (HIV) or AIDS -low blood platelet counts -multiple sclerosis -an unusual or allergic reaction to influenza virus vaccine, latex, other medicines, foods, dyes, or preservatives. Different brands of vaccines contain different allergens. Some may contain latex or eggs. Talk to your doctor about your allergies to make sure that you get the right vaccine. -pregnant or trying to get pregnant -breast-feeding How should I use this medicine? This vaccine is for injection into a muscle or under the skin. It is given by a health care professional. A copy of Vaccine Information Statements will be given before each vaccination. Read this sheet carefully each time. The sheet may change frequently. Talk to your healthcare provider to see which vaccines are right for you. Some  vaccines should not be used in all age groups. Overdosage: If you think you have taken too much of this medicine contact a poison control center or emergency room at once. NOTE: This medicine is only for you. Do not share this medicine with others. What if I miss a dose? This does not apply. What may interact with this medicine? -chemotherapy or radiation therapy -medicines that lower your immune system like etanercept, anakinra, infliximab, and adalimumab -medicines that treat or prevent blood clots like warfarin -phenytoin -steroid medicines like prednisone or cortisone -theophylline -vaccines This list may not describe all possible interactions. Give your health care provider a list of all the medicines, herbs, non-prescription drugs, or dietary supplements you use. Also tell them if you smoke, drink alcohol, or use illegal drugs. Some items may interact with your medicine. What should I watch for while using this medicine? Report any side effects that do not go away within 3 days to your doctor or health care professional. Call your health care provider if any unusual symptoms occur within 6 weeks of receiving this vaccine. You may still catch the flu, but the illness is not usually as bad. You cannot get the flu from the vaccine. The vaccine will not protect against colds or other illnesses that may cause fever. The vaccine is needed every year. What side effects may I notice from receiving this medicine? Side effects that you should report to your doctor or health care professional as soon as possible: -allergic reactions like skin rash, itching or hives, swelling of the face, lips, or tongue Side effects that usually do not require medical attention (report to your doctor or health care  professional if they continue or are bothersome): -fever -headache -muscle aches and pains -pain, tenderness, redness, or swelling at the injection site -tiredness This list may not describe all  possible side effects. Call your doctor for medical advice about side effects. You may report side effects to FDA at 1-800-FDA-1088. Where should I keep my medicine? The vaccine will be given by a health care professional in a clinic, pharmacy, doctor's office, or other health care setting. You will not be given vaccine doses to store at home. NOTE: This sheet is a summary. It may not cover all possible information. If you have questions about this medicine, talk to your doctor, pharmacist, or health care provider.  2015, Elsevier/Gold Standard. (2012-01-03 16:10:96)

## 2015-04-13 ENCOUNTER — Ambulatory Visit: Payer: Self-pay | Attending: Family Medicine | Admitting: Pharmacist

## 2015-04-13 DIAGNOSIS — E119 Type 2 diabetes mellitus without complications: Secondary | ICD-10-CM | POA: Insufficient documentation

## 2015-04-13 DIAGNOSIS — E108 Type 1 diabetes mellitus with unspecified complications: Secondary | ICD-10-CM

## 2015-04-13 DIAGNOSIS — Z794 Long term (current) use of insulin: Secondary | ICD-10-CM | POA: Insufficient documentation

## 2015-04-13 NOTE — Progress Notes (Signed)
S:    Patient arrives in good spirits with his girlfriend.  Presents for diabetes follow up.   Patient reports adherence with medications. Current diabetes medications include Lantus 20 units daily and Novolog 2-4 units with meals.   Patient reports 1 hypoglycemic events but it occurred because he did not eat the night before. He denies any ED visits due to hypoglycemia.   Patient denies nocturia.  Patient denies neuropathy. Patient denies visual changes. Patient reports self foot exams.   Patient is still interested in using an insulin pump. He understands that a pump is a lot of responsibility but he reports that he has had one in the past.    O:  Lab Results  Component Value Date   HGBA1C 8.60 03/10/2015    Home fasting CBG: 86 - 188 (most <150) 2 hour post-prandial/random CBG:98 - 192 (most <180).  A/P: Type 1 Diabetes currently uncontrolled based on A1c of 8.6 but under better control due to less frequent episodes of hypoglycemia.   Patient reports hypoglycemic events and is able to verbalize appropriate hypoglycemia management plan.  Patient reports adherence with medication. Control is suboptimal due to work schedule - he often switches between 2nd and 3rd shift.  Will make no changes to medications at this time. I think it is very important to keep patient out of the ED for hypoglycemia and he is working on getting a set schedule in the next two weeks. I think this will help as he will have a fixed schedule and regular meals. Patient to return to see me if he has frequent hypoglycemia or sees multiple readings >200. He will focus on carb counting to get fasting CBGs to goal, though many of them already are.  Next A1C anticipated December 2016.    Written patient instructions provided.  Total time in face to face counseling 20 minutes.   Follow up in Pharmacist Clinic Visit as needed.

## 2015-04-13 NOTE — Patient Instructions (Signed)
Thanks for coming to see me!  Your blood sugars are looking much better! Keep up the good work!  Follow up with Dr. Armen Pickup but come back and see me if you start seeing multiple readings <80 or >200

## 2015-04-18 ENCOUNTER — Telehealth: Payer: Self-pay | Admitting: Internal Medicine

## 2015-04-18 DIAGNOSIS — R569 Unspecified convulsions: Secondary | ICD-10-CM

## 2015-04-18 NOTE — Telephone Encounter (Signed)
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 Please follow up with patient if letter can be provided without an appointment. Thank you.

## 2015-04-19 NOTE — Telephone Encounter (Signed)
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 I explained that it usually takes up to 48 hrs for a nurse to call him back. Patient mentions that he needs this paperwork by the end of the week in order to not lose this job opportunity. Please f/u with patient ASAP.

## 2015-04-20 MED ORDER — DIVALPROEX SODIUM ER 500 MG PO TB24: 1500.0000 mg | ORAL_TABLET | Freq: Every day | ORAL | Status: DC

## 2015-04-20 NOTE — Telephone Encounter (Signed)
Patient called  We discussed that given that he has been seizure free for over a year, compliant with depakote and the new position will allow for a better 12 AM-8 AM consistent schedule in a climate controlled area he is medically cleared for the job.   However, since his depakote level was a bit low on 1000 mg daily. He has been instructed to increase to 1500 mg daily.   Be sure to keep regular sleep cycle.   Letter written and will be faxed to his employer along with the form once the form is received.

## 2015-04-20 NOTE — Telephone Encounter (Signed)
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 Please f/u

## 2015-04-21 ENCOUNTER — Telehealth: Payer: Self-pay | Admitting: Family Medicine

## 2015-04-21 NOTE — Telephone Encounter (Signed)
Patient presents to the clinic to pick up a letter and form for his employer. Patient has been attempting to get this addressed since Monday but we recently received the fax from his employer yesterday. Patient mentioned that his job is on the line and is in urgent need for this paperwork to be done by tomorrow, Friday the 14th. Please follow up with pt. Thank you.

## 2015-04-21 NOTE — Telephone Encounter (Signed)
Letter written Paperwork for his employer received Letter will be faxed today

## 2015-07-14 MED FILL — !LANTUS SOLOSTAR 100UNITS/M: 100 | 12 days supply | Qty: 3 | Fill #3

## 2015-07-14 MED FILL — $novoLOG FLEXPEN SYRINGE: 100 | 30 days supply | Qty: 3 | Fill #0

## 2015-07-15 ENCOUNTER — Other Ambulatory Visit: Payer: Self-pay

## 2015-07-15 DIAGNOSIS — E108 Type 1 diabetes mellitus with unspecified complications: Secondary | ICD-10-CM

## 2015-07-15 MED ORDER — INSULIN GLARGINE 100 UNIT/ML SOLOSTAR PEN: 20.0000 [IU] | PEN_INJECTOR | Freq: Every day | SUBCUTANEOUS | Status: DC

## 2015-07-15 MED ORDER — INSULIN ASPART 100 UNIT/ML ~~LOC~~ SOLN: 2.0000 [IU] | Freq: Three times a day (TID) | SUBCUTANEOUS | Status: DC

## 2015-07-19 ENCOUNTER — Other Ambulatory Visit: Payer: Self-pay | Admitting: Family Medicine

## 2015-07-19 DIAGNOSIS — E108 Type 1 diabetes mellitus with unspecified complications: Secondary | ICD-10-CM

## 2015-07-19 MED ORDER — INSULIN ASPART 100 UNIT/ML ~~LOC~~ SOLN: 2.0000 [IU] | Freq: Three times a day (TID) | SUBCUTANEOUS | Status: DC

## 2015-07-19 MED ORDER — INSULIN GLARGINE 100 UNIT/ML SOLOSTAR PEN: 20.0000 [IU] | PEN_INJECTOR | Freq: Every day | SUBCUTANEOUS | Status: DC

## 2015-08-02 ENCOUNTER — Other Ambulatory Visit: Payer: Self-pay | Admitting: Internal Medicine

## 2015-08-02 MED FILL — !LANTUS SOLOSTAR 100UNITS/M: 100 | 12 days supply | Qty: 3 | Fill #4

## 2015-08-08 ENCOUNTER — Other Ambulatory Visit: Payer: Self-pay | Admitting: *Deleted

## 2015-08-08 DIAGNOSIS — E108 Type 1 diabetes mellitus with unspecified complications: Secondary | ICD-10-CM

## 2015-08-08 MED ORDER — INSULIN ASPART 100 UNIT/ML ~~LOC~~ SOLN: 2.0000 [IU] | Freq: Three times a day (TID) | SUBCUTANEOUS | Status: DC

## 2015-09-22 MED FILL — ?DIVALPROEX SOD ER 500MG TA: 500 | 30 days supply | Qty: 90 | Fill #1

## 2015-09-22 MED FILL — TRUE METRIX TEST STRIP: 33 days supply | Qty: 100 | Fill #0

## 2015-09-22 MED FILL — ?LISINOPRIL 10 MG TABLET: 10 | 30 days supply | Qty: 30 | Fill #0

## 2015-09-22 MED FILL — SIMVASTATIN 20 MG TABLET: 20 | 30 days supply | Qty: 30 | Fill #1

## 2015-09-22 MED FILL — $LANTUS SOLOSTAR 100 UNITS/: 100 | 44 days supply | Qty: 15 | Fill #0

## 2015-09-23 MED FILL — $novoLOG FLEXPEN SYRINGE: 100 | 30 days supply | Qty: 3 | Fill #1

## 2015-10-24 MED FILL — ?SIMVASTATIN 20 MG TABLET: 20 MG | 30 days supply | Qty: 30 | Fill #2

## 2015-10-24 MED FILL — $novoLOG FLEXPEN SYRINGE: 100 | 30 days supply | Qty: 3 | Fill #2

## 2015-10-24 MED FILL — ?LISINOPRIL 10 MG TABLET: 10 | 30 days supply | Qty: 30 | Fill #1

## 2015-10-24 MED FILL — TRUE METRIX BLOOD GLUCOSE M: W/DEVICE | 365 days supply | Qty: 1 | Fill #0

## 2015-11-17 MED FILL — ?DIVALPROEX SOD ER 500MG TA: 500 | 30 days supply | Qty: 90 | Fill #2

## 2015-11-17 MED FILL — LISINOPRIL 10 MG TABLET: 10 | 30 days supply | Qty: 30 | Fill #2

## 2015-11-17 MED FILL — $LANTUS SOLOSTAR 100 UNITS/: 100 | 75 days supply | Qty: 15 | Fill #1

## 2015-11-17 MED FILL — $novoLOG FLEXPEN SYRINGE: 100 | 30 days supply | Qty: 3 | Fill #3

## 2015-11-18 MED FILL — ?SIMVASTATIN 20 MG TABLET: 20 MG | 30 days supply | Qty: 30 | Fill #3

## 2015-12-16 ENCOUNTER — Encounter: Payer: Self-pay | Admitting: Family Medicine

## 2015-12-16 ENCOUNTER — Ambulatory Visit: Payer: Self-pay | Attending: Family Medicine | Admitting: Family Medicine

## 2015-12-16 VITALS — BP 149/83 | HR 65 | Temp 98.5°F | Resp 16 | Ht 71.0 in | Wt 184.0 lb

## 2015-12-16 DIAGNOSIS — I10 Essential (primary) hypertension: Secondary | ICD-10-CM | POA: Insufficient documentation

## 2015-12-16 DIAGNOSIS — Z794 Long term (current) use of insulin: Secondary | ICD-10-CM | POA: Insufficient documentation

## 2015-12-16 DIAGNOSIS — K0889 Other specified disorders of teeth and supporting structures: Secondary | ICD-10-CM

## 2015-12-16 DIAGNOSIS — Z7982 Long term (current) use of aspirin: Secondary | ICD-10-CM | POA: Insufficient documentation

## 2015-12-16 DIAGNOSIS — R569 Unspecified convulsions: Secondary | ICD-10-CM | POA: Insufficient documentation

## 2015-12-16 DIAGNOSIS — Z79899 Other long term (current) drug therapy: Secondary | ICD-10-CM | POA: Insufficient documentation

## 2015-12-16 DIAGNOSIS — Z791 Long term (current) use of non-steroidal anti-inflammatories (NSAID): Secondary | ICD-10-CM | POA: Insufficient documentation

## 2015-12-16 DIAGNOSIS — E108 Type 1 diabetes mellitus with unspecified complications: Secondary | ICD-10-CM | POA: Insufficient documentation

## 2015-12-16 DIAGNOSIS — E785 Hyperlipidemia, unspecified: Secondary | ICD-10-CM | POA: Insufficient documentation

## 2015-12-16 DIAGNOSIS — F319 Bipolar disorder, unspecified: Secondary | ICD-10-CM | POA: Insufficient documentation

## 2015-12-16 LAB — GLUCOSE, POCT (MANUAL RESULT ENTRY): POC Glucose: 70 mg/dl (ref 70–99)

## 2015-12-16 LAB — POCT GLYCOSYLATED HEMOGLOBIN (HGB A1C): Hemoglobin A1C: 9.7

## 2015-12-16 MED ORDER — INSULIN ASPART 100 UNIT/ML ~~LOC~~ SOLN: 1.0000 [IU] | Freq: Three times a day (TID) | SUBCUTANEOUS | Status: DC

## 2015-12-16 MED ORDER — SIMVASTATIN 20 MG PO TABS: 20.0000 mg | ORAL_TABLET | Freq: Every day | ORAL | Status: DC

## 2015-12-16 MED ORDER — DIVALPROEX SODIUM ER 500 MG PO TB24: 1500.0000 mg | ORAL_TABLET | Freq: Every day | ORAL | Status: DC

## 2015-12-16 MED ORDER — LISINOPRIL 20 MG PO TABS
20.0000 mg | ORAL_TABLET | Freq: Every morning | ORAL | Status: DC
Start: 2015-12-16 — End: 2016-11-05

## 2015-12-16 MED ORDER — INSULIN GLARGINE 100 UNIT/ML SOLOSTAR PEN: 23.0000 [IU] | PEN_INJECTOR | Freq: Every day | SUBCUTANEOUS | Status: DC

## 2015-12-16 MED FILL — $novoLOG FLEXPEN SYRINGE: 100 | 30 days supply | Qty: 3 | Fill #4

## 2015-12-16 MED FILL — ?LISINOPRIL 20 MG TABLET: 20 | 30 days supply | Qty: 30 | Fill #0

## 2015-12-16 NOTE — Assessment & Plan Note (Signed)
Declined with rise in A1c. He is very sensitive to novolog.  Plan: Increase lantus from 20 to 23 U daily Continue novolog 0-4 U prn CBG with meals

## 2015-12-16 NOTE — Progress Notes (Signed)
F/U DM  Requesting medicine refills  Glucose running 70-170 No pain today  No tobacco user  No suicidal thoughts in the past two week

## 2015-12-16 NOTE — Assessment & Plan Note (Signed)
Seizure free Checking Depakote level Refilled Depakote

## 2015-12-16 NOTE — Patient Instructions (Signed)
[name] was seen today for diabetes.  Diagnoses and all orders for this visit:  DM (diabetes mellitus), type 1 with complications (HCC) -     POCT glycosylated hemoglobin (Hb A1C) -     Glucose (CBG) -     Insulin Glargine (LANTUS SOLOSTAR) 100 UNIT/ML Solostar Pen; Inject 23 Units into the skin daily at 10 pm. -     Ambulatory referral to Ophthalmology -     insulin aspart (NOVOLOG) 100 UNIT/ML injection; Inject 1-4 Units into the skin 3 (three) times daily before meals. If sugar > 120 -     COMPLETE METABOLIC PANEL WITH GFR  Pain of molar -     Ambulatory referral to Dentistry  Essential hypertension, benign -     lisinopril (PRINIVIL,ZESTRIL) 20 MG tablet; Take 1 tablet (20 mg total) by mouth every morning. -     COMPLETE METABOLIC PANEL WITH GFR  Seizures (HCC) -     divalproex (DEPAKOTE ER) 500 MG 24 hr tablet; Take 3 tablets (1,500 mg total) by mouth daily. -     Valproic acid level  Hyperlipidemia -     simvastatin (ZOCOR) 20 MG tablet; Take 1 tablet (20 mg total) by mouth daily. -     Lipid Panel    [facility] located downtown    Please apply for [facility] discount and orange card, you can also inquire if any of your medications are on the PASS (medications assistance) list.   F/u in 4 weeks for HTB, BP check  Dr. Armen PickupFunches

## 2015-12-16 NOTE — Progress Notes (Signed)
Subjective:  Patient ID: [name], male    DOB: 12/01/1975  Age: 40 y.o. MRN: [id number]  CC: Diabetes   HPI [place] presents for   1. CHRONIC DIABETES  Disease Monitoring  Blood Sugar Ranges: 45-Hi  Fasting 60-88, one month ago it was 45   Polyuria: no   Visual problems: no   Medication Compliance: yes, lantus 20 U at night. 0-4 U of novolog with meals depending on his activity level.   Medication Side Effects  Hypoglycemia: yes, down to [place] Exam: due   Foot Exam: done today   Diet pattern: eating well, low carb and a lot of water   Exercise: yes   2. HTN: taking lisinopril 10 mg daily. BP elevated at dentist. No HA, CP, SOB or swelling.  3. Seizures/bipolar disorder: remains seizure free. Continues to take Depakote 1500 mg daily. Mood is fair. He worries about his finances. He has medical bills and student loans that are difficult to pay.   Social History  Substance Use Topics  . Smoking status: Never Smoker   . Smokeless tobacco: Not on file  . Alcohol Use: No     Comment: rarely   Outpatient Prescriptions Prior to Visit  Medication Sig Dispense Refill  . aspirin 325 MG tablet Take 325 mg by mouth daily.    . Blood Glucose Monitoring Suppl (TRUE METRIX METER) W/DEVICE KIT 1 each by Does not apply route as needed. 1 kit 0  . divalproex (DEPAKOTE ER) 500 MG 24 hr tablet Take 3 tablets (1,500 mg total) by mouth daily. 90 tablet 2  . glucagon 1 MG injection Inject 1 mg into the muscle once as needed. 1 each 12  . glucose blood (TRUE METRIX BLOOD GLUCOSE TEST) test strip 1 each by Other route 3 (three) times daily. 100 each 11  . ibuprofen (ADVIL,MOTRIN) 600 MG tablet Take 1 tablet (600 mg total) by mouth every 8 (eight) hours as needed. 30 tablet 1  . insulin aspart (NOVOLOG) 100 UNIT/ML injection Inject 2 Units into the skin 3 (three) times daily before meals. If sugar > 120 30 mL 3  . Insulin Glargine (LANTUS SOLOSTAR) 100  UNIT/ML Solostar Pen Inject 20 Units into the skin daily at 10 pm. 30 mL 3  . lisinopril (PRINIVIL,ZESTRIL) 10 MG tablet Take 1 tablet (10 mg total) by mouth every morning. 30 tablet 5  . simvastatin (ZOCOR) 20 MG tablet Take 1 tablet (20 mg total) by mouth daily. 30 tablet 5  . TRUEPLUS LANCETS 28G MISC 1 each by Does not apply route 3 (three) times daily. 100 each 11   No facility-administered medications prior to visit.    ROS Review of Systems  Constitutional: Negative for fever, chills, fatigue and unexpected weight change.  HENT: Negative for dental problem.   Eyes: Negative for visual disturbance.  Respiratory: Negative for cough and shortness of breath.   Cardiovascular: Negative for chest pain, palpitations and leg swelling.  Gastrointestinal: Negative for nausea, vomiting, abdominal pain, diarrhea, constipation and blood in stool.  Endocrine: Negative for polydipsia, polyphagia and polyuria.  Musculoskeletal: Negative for myalgias, back pain, arthralgias, gait problem and neck pain.  Skin: Negative for rash.  Allergic/Immunologic: Negative for immunocompromised state.  Neurological: Negative for seizures.  Hematological: Negative for adenopathy. Does not bruise/bleed easily.  Psychiatric/Behavioral: Positive for dysphoric mood. Negative for suicidal ideas and sleep disturbance. The patient is nervous/anxious.     Objective:  BP 149/83 mmHg  Pulse 65  Temp(Src) 98.5 F (36.9 C) (Oral)  Resp 16  Ht _0  (1.803 m)  Wt 184 lb (83.462 kg)  BMI 25.67 kg/m2  SpO2 99%  BP/Weight 12/16/2015 11[birth year]09/84 01/11/1947  Systolic BP 932 671 245  Diastolic BP 83 78 86  Wt. (Lbs) 184 182 -  BMI 25.67 25.4 -    Physical Exam  Constitutional: He appears well-developed and well-nourished. No distress.  HENT:  Head: Normocephalic and atraumatic.  Neck: Normal range of motion. Neck supple.  Cardiovascular: Normal rate, regular rhythm, normal heart sounds and intact distal pulses.     Pulmonary/Chest: Effort normal and breath sounds normal.  Musculoskeletal: He exhibits no edema.  Neurological: He is alert.  Skin: Skin is warm and dry. No rash noted. No erythema.  Psychiatric: He has a normal mood and affect.   Lab Results  Component Value Date   HGBA1C 9.7 12/16/2015   CBG 70   Depression screen [facility] 2/9 12/16/2015 03/10/2015  Decreased Interest 0 2  Down, Depressed, Hopeless 1 0  PHQ - 2 Score 1 2  Altered sleeping 0 3  Tired, decreased energy 1 1  Change in appetite 1 2  Feeling bad or failure about yourself  1 2  Trouble concentrating 0 2  Moving slowly or fidgety/restless 0 0  Suicidal thoughts 0 0  PHQ-9 Score 4 12   GAD 7 : Generalized Anxiety Score 12/16/2015  Nervous, Anxious, on Edge 1  Control/stop worrying 2  Worry too much - different things 3  Trouble relaxing 1  Restless 0  Easily annoyed or irritable 1  Afraid - awful might happen 1  Total GAD 7 Score 9     Assessment & Plan:   [name] was seen today for diabetes.  Diagnoses and all orders for this visit:  DM (diabetes mellitus), type 1 with complications ([employer]) -     POCT glycosylated hemoglobin (Hb A1C) -     Glucose (CBG) -     Insulin Glargine (LANTUS SOLOSTAR) 100 UNIT/ML Solostar Pen; Inject 23 Units into the skin daily at 10 pm. -     Ambulatory referral to Ophthalmology -     insulin aspart (NOVOLOG) 100 UNIT/ML injection; Inject 1-4 Units into the skin 3 (three) times daily before meals. If sugar > 120 -     COMPLETE METABOLIC PANEL WITH GFR  Pain of molar -     Ambulatory referral to Dentistry  Essential hypertension, benign -     lisinopril (PRINIVIL,ZESTRIL) 20 MG tablet; Take 1 tablet (20 mg total) by mouth every morning. -     COMPLETE METABOLIC PANEL WITH GFR  Seizures (HCC) -     divalproex (DEPAKOTE ER) 500 MG 24 hr tablet; Take 3 tablets (1,500 mg total) by mouth daily. -     Valproic acid level  Hyperlipidemia -     simvastatin (ZOCOR) 20 MG tablet; Take 1  tablet (20 mg total) by mouth daily. -     Lipid Panel    No orders of the defined types were placed in this encounter.    Follow-up: No Follow-up on file.   Boykin Nearing MD

## 2015-12-17 LAB — COMPLETE METABOLIC PANEL WITH GFR
ALT: 13 U/L (ref 9–46)
AST: 21 U/L (ref 10–40)
Albumin: 3.7 g/dL (ref 3.6–5.1)
Alkaline Phosphatase: 32 U/L — ABNORMAL LOW (ref 40–115)
BUN: 20 mg/dL (ref 7–25)
CALCIUM: 8.9 mg/dL (ref 8.6–10.3)
CHLORIDE: 105 mmol/L (ref 98–110)
CO2: 27 mmol/L (ref 20–31)
CREATININE: 1.32 mg/dL (ref 0.60–1.35)
GFR, EST AFRICAN AMERICAN: 79 mL/min (ref >=60)
GFR, EST NON AFRICAN AMERICAN: 68 mL/min (ref >=60)
Glucose, Bld: 111 mg/dL — ABNORMAL HIGH (ref 65–99)
POTASSIUM: 4.3 mmol/L (ref 3.5–5.3)
Sodium: 143 mmol/L (ref 135–146)
Total Bilirubin: 0.4 mg/dL (ref 0.2–1.2)
Total Protein: 6.1 g/dL (ref 6.1–8.1)

## 2015-12-17 LAB — LIPID PANEL
CHOL/HDL RATIO: 1.7 ratio (ref <=5.0)
CHOLESTEROL: 135 mg/dL (ref 125–200)
HDL: 78 mg/dL (ref >=40)
LDL Cholesterol: 51 mg/dL (ref <130)
TRIGLYCERIDES: 32 mg/dL (ref <150)
VLDL: 6 mg/dL (ref <30)

## 2015-12-17 LAB — VALPROIC ACID LEVEL: VALPROIC ACID LVL: 107.1 ug/mL — AB (ref 50.0–100.0)

## 2015-12-19 ENCOUNTER — Telehealth: Payer: Self-pay | Admitting: *Deleted

## 2015-12-19 MED ORDER — INSULIN ASPART 100 UNIT/ML ~~LOC~~ SOLN: 1.0000 [IU] | Freq: Three times a day (TID) | SUBCUTANEOUS | Status: DC

## 2015-12-19 NOTE — Addendum Note (Signed)
Addended by: Dessa PhiFUNCHES, [place] Fiser on: 12/19/2015 09:01 AM   Modules accepted: Orders

## 2015-12-19 NOTE — Telephone Encounter (Signed)
Date of birth verified by pt  Normal lab results given  Continue with current regimen as directed by PCP  Pt verbalized understanding

## 2015-12-19 NOTE — Telephone Encounter (Signed)
-----   Message from Dessa PhiJosalyn Funches, MD sent at 12/17/2015  1:04 PM EDT ----- Valproic acid level within normal range for seizure and bipolar disorder Normal CMP and lipids  Continue current regimen

## 2015-12-28 MED FILL — DIVALPROEX SOD ER 500 MG TA: 500 | 30 days supply | Qty: 90 | Fill #0

## 2015-12-28 MED FILL — ?SIMVASTATIN 20 MG TABLET: 20 MG | 30 days supply | Qty: 30 | Fill #4

## 2015-12-28 MED FILL — TRUE METRIX TEST STRIP: 33 days supply | Qty: 100 | Fill #1

## 2015-12-30 ENCOUNTER — Ambulatory Visit: Payer: Self-pay | Admitting: Family Medicine

## 2015-12-30 MED FILL — $novoLOG FLEXPEN SYRINGE: 100 | 28 days supply | Qty: 6 | Fill #0

## 2016-01-13 ENCOUNTER — Ambulatory Visit: Payer: Self-pay | Attending: Family Medicine | Admitting: Family Medicine

## 2016-01-13 ENCOUNTER — Ambulatory Visit: Payer: Self-pay | Admitting: Family Medicine

## 2016-01-13 ENCOUNTER — Encounter: Payer: Self-pay | Admitting: Family Medicine

## 2016-01-13 VITALS — BP 153/83 | HR 60 | Temp 99.0°F | Resp 18 | Ht 72.0 in | Wt 178.8 lb

## 2016-01-13 DIAGNOSIS — I10 Essential (primary) hypertension: Secondary | ICD-10-CM

## 2016-01-13 DIAGNOSIS — E108 Type 1 diabetes mellitus with unspecified complications: Secondary | ICD-10-CM

## 2016-01-13 DIAGNOSIS — N5082 Scrotal pain: Secondary | ICD-10-CM

## 2016-01-13 DIAGNOSIS — R739 Hyperglycemia, unspecified: Secondary | ICD-10-CM

## 2016-01-13 LAB — GLUCOSE, POCT (MANUAL RESULT ENTRY)
POC GLUCOSE: 174 mg/dL — AB (ref 70–99)
POC Glucose: 502 mg/dl — AB (ref 70–99)

## 2016-01-13 LAB — POCT GLYCOSYLATED HEMOGLOBIN (HGB A1C): Hemoglobin A1C: 9.2

## 2016-01-13 MED ORDER — INSULIN ASPART 100 UNIT/ML ~~LOC~~ SOLN: 4.0000 [IU] | Freq: Once | SUBCUTANEOUS | Status: DC

## 2016-01-13 NOTE — Assessment & Plan Note (Signed)
Elevated BP in setting of hyperglycemia Continue lisinopril 20 mg daily F/u in 2 weeks Plan to add CCB or thiazide diuretic to get BP to goal of < 140/90 in [place] diabetes

## 2016-01-13 NOTE — Assessment & Plan Note (Signed)
Hyperglycemia today in setting of uncontrolled IDDM type 1 Patient responded well to 4 U of novolog Continue current regimen

## 2016-01-13 NOTE — Patient Instructions (Signed)
[name] was seen today for hypertension and diabetes.  Diagnoses and all orders for this visit:  DM (diabetes mellitus), type 1 with complications (HCC) -     Glucose (CBG) -     HgB A1c -     insulin aspart (novoLOG) injection 4 Units; Inject 0.04 mLs (4 Units total) into the skin once.  Hyperglycemia -     insulin aspart (novoLOG) injection 4 Units; Inject 0.04 mLs (4 Units total) into the skin once.  Scrotal pain -     US Scrotum; Future    F/u in   Dr. Armen PickupFunches

## 2016-01-13 NOTE — Addendum Note (Signed)
Addended by: Yancey FlemingsBECTON, Regla Fitzgibbon R on: 01/13/2016 09:40 PM   Modules accepted: Kipp BroodSmartSet

## 2016-01-13 NOTE — Progress Notes (Signed)
Subjective:  Patient ID: [name], male    DOB: 1975/09/22  Age: 40 y.o. MRN: [id number]  CC: Hypertension and Diabetes   HPI [name] presents for   1. L scrotal irregular: x 3 years. 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 No direct trauma to scrotum. Intermittent sharp pains in scrotum.   2. HTN: taking lisinopril 20 mg daily. No HA, CP or SOB.   3. DM2: CBG is still labile. Was 53 before arriving. He drank a coffee drink and added 1 Tbsp sugar. He is complaint with lantus. He takes novolog based on sliding scale.   Social History  Substance Use Topics  . Smoking status: Never Smoker   . Smokeless tobacco: Not on file  . Alcohol Use: 0.6 oz/week    1 Glasses of wine per week     Comment: rarely    Outpatient Prescriptions Prior to Visit  Medication Sig Dispense Refill  . Blood Glucose Monitoring Suppl (TRUE METRIX METER) W/DEVICE KIT 1 each by Does not apply route as needed. 1 kit 0  . divalproex (DEPAKOTE ER) 500 MG 24 hr tablet Take 3 tablets (1,500 mg total) by mouth daily. 270 tablet 3  . glucose blood (TRUE METRIX BLOOD GLUCOSE TEST) test strip 1 each by Other route 3 (three) times daily. 100 each 11  . insulin aspart (NOVOLOG) 100 UNIT/ML injection Inject 1-4 Units into the skin 3 (three) times daily before meals. If sugar > 120 30 mL 3  . Insulin Glargine (LANTUS SOLOSTAR) 100 UNIT/ML Solostar Pen Inject 23 Units into the skin daily at 10 pm. 30 mL 3  . lisinopril (PRINIVIL,ZESTRIL) 20 MG tablet Take 1 tablet (20 mg total) by mouth every morning. 90 tablet 3  . simvastatin (ZOCOR) 20 MG tablet Take 1 tablet (20 mg total) by mouth daily. 90 tablet 3  . TRUEPLUS LANCETS 28G MISC 1 each by Does not apply route 3 (three) times daily. 100 each 11  . aspirin 325 MG tablet Take 325 mg by mouth daily. Reported on 01/13/2016    . glucagon 1 MG injection Inject 1 mg into the muscle once as needed. (Patient not taking: Reported on 01/13/2016) 1 each 12   No facility-administered  medications prior to visit.    ROS Review of Systems  Constitutional: Negative for fever, chills, fatigue and unexpected weight change.  HENT: Negative for dental problem.   Eyes: Negative for visual disturbance.  Respiratory: Negative for cough and shortness of breath.   Cardiovascular: Negative for chest pain, palpitations and leg swelling.  Gastrointestinal: Negative for nausea, vomiting, abdominal pain, diarrhea, constipation and blood in stool.  Endocrine: Negative for polydipsia, polyphagia and polyuria.  Genitourinary: Positive for scrotal swelling and testicular pain.  Musculoskeletal: Negative for myalgias, back pain, arthralgias, gait problem and neck pain.  Skin: Negative for rash.  Allergic/Immunologic: Negative for immunocompromised state.  Neurological: Negative for seizures.  Hematological: Negative for adenopathy. Does not bruise/bleed easily.  Psychiatric/Behavioral: Positive for dysphoric mood. Negative for suicidal ideas and sleep disturbance. The patient is nervous/anxious.     Objective:  BP 153/83 mmHg  Pulse 60  Temp(Src) 99 F (37.2 C) (Oral)  Resp 18  Ht 6' (1.829 m)  Wt 178 lb 12.8 oz (81.103 kg)  BMI 24.24 kg/m2  SpO2 100%  BP/Weight 01/13/2016 01/07/1656 9/0/3833  Systolic BP 383 291 916  Diastolic BP 83 83 78  Wt. (Lbs) 178.8 184 182  BMI 24.24 25.67 25.4    Physical Exam  Constitutional: He appears well-developed and well-nourished. No distress.  HENT:  Head: Normocephalic and atraumatic.  Neck: Normal range of motion. Neck supple.  Cardiovascular: Normal rate, regular rhythm, normal heart sounds and intact distal pulses.   Pulmonary/Chest: Effort normal and breath sounds normal.  Abdominal: Hernia confirmed negative in the right inguinal area and confirmed negative in the left inguinal area.  Genitourinary: Right testis shows tenderness. Right testis shows no mass and no swelling. Right testis is descended. Cremasteric reflex is not absent on  the right side. Left testis shows tenderness. Left testis shows no mass and no swelling. Left testis is descended. Cremasteric reflex is not absent on the left side. Penile tenderness present.  Musculoskeletal: He exhibits no edema.  Lymphadenopathy:       Right: No inguinal adenopathy present.       Left: No inguinal adenopathy present.  Neurological: He is alert.  Skin: Skin is warm and dry. No rash noted. No erythema.  Psychiatric: He has a normal mood and affect.   Lab Results  Component Value Date   HGBA1C 9.7 12/16/2015   CBG 502  Treated with 4 U of novolog  Repeat CBG 174  Assessment & Plan:   [name] was seen today for hypertension and diabetes.  Diagnoses and all orders for this visit:  DM (diabetes mellitus), type 1 with complications ([name]) -     Glucose (CBG) -     HgB A1c -     insulin aspart (novoLOG) injection 4 Units; Inject 0.04 mLs (4 Units total) into the skin once. -     Glucose (CBG)  Hyperglycemia -     insulin aspart (novoLOG) injection 4 Units; Inject 0.04 mLs (4 Units total) into the skin once. -     Glucose (CBG)  Scrotal pain -     US Scrotum; Future    No orders of the defined types were placed in this encounter.    Follow-up: No Follow-up on file.   Boykin Nearing MD

## 2016-01-13 NOTE — Assessment & Plan Note (Signed)
Normal exam with scrotal pain Scrotal US ordered

## 2016-01-13 NOTE — Progress Notes (Signed)
"  left testicle twisted" for three years- embarrassed to talk about it

## 2016-01-20 ENCOUNTER — Ambulatory Visit (HOSPITAL_COMMUNITY): Payer: Self-pay

## 2016-01-27 ENCOUNTER — Ambulatory Visit: Payer: Self-pay | Admitting: Family Medicine

## 2016-02-09 MED FILL — DIVALPROEX SOD ER 500 MG TA: 500 | 30 days supply | Qty: 90 | Fill #1

## 2016-02-09 MED FILL — !NOVOLOG 100UNITS/ML VIAL: 100/ML | 90 days supply | Qty: 10 | Fill #0

## 2016-02-09 MED FILL — ?LISINOPRIL 20 MG TABLET: 20 | 30 days supply | Qty: 30 | Fill #1

## 2016-02-09 MED FILL — !LANTUS SOLOSTAR 100UNITS/M: 100 | 29 days supply | Qty: 6 | Fill #2

## 2016-02-09 MED FILL — ?SIMVASTATIN 20 MG TABLET: 20 MG | 30 days supply | Qty: 30 | Fill #5

## 2016-02-16 ENCOUNTER — Other Ambulatory Visit: Payer: Self-pay | Admitting: Occupational Medicine

## 2016-02-16 ENCOUNTER — Ambulatory Visit: Payer: Self-pay

## 2016-02-16 DIAGNOSIS — Z0000 Encounter for general adult medical examination without abnormal findings: Secondary | ICD-10-CM

## 2016-02-21 ENCOUNTER — Telehealth: Payer: Self-pay | Admitting: Family Medicine

## 2016-02-21 DIAGNOSIS — E108 Type 1 diabetes mellitus with unspecified complications: Secondary | ICD-10-CM

## 2016-02-21 NOTE — Telephone Encounter (Signed)
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 Please f/up

## 2016-02-23 NOTE — Telephone Encounter (Signed)
Patient came to clinic to follow up on request.  Patient is needing an approval letter for work because he is diabetic. Please follow up.

## 2016-02-23 NOTE — Telephone Encounter (Signed)
Please inform patient that letter for work written and ready for pick up

## 2016-03-02 NOTE — Telephone Encounter (Signed)
Pt paperwork will be faxed over 8/25. Pt was called and informed that paperwork will be faxed over.

## 2016-03-02 NOTE — Telephone Encounter (Signed)
Pt. Called requesting a Rx for needles. Pt. States that the pharmacy he uses does not carry it anymore and needs for [facility] to [facility] [place]. Please f/u

## 2016-03-02 NOTE — Telephone Encounter (Signed)
Pt calling to follow up on paperwork  Pt states he dropped off paperwork last Friday, 8/18, that was approval for him to work due to being diabetic  Pt is upset that paperwork is taking so long, pt states he has been trying to get this filled out for a month Pt was informed that paperwork can take up to 7-14 business days

## 2016-03-05 MED ORDER — INSULIN SYRINGE-NEEDLE U-100 31G X 5/16" 0.5 ML MISC
1.0000 | Freq: Three times a day (TID) | 11 refills | Status: DC
Start: 2016-03-05 — End: 2018-10-27

## 2016-03-05 MED ORDER — INSULIN PEN NEEDLE 31G X 8 MM MISC: 1.0000 "application " | Freq: Every day | 3 refills | Status: DC

## 2016-03-05 NOTE — Telephone Encounter (Signed)
Will route to PCP 

## 2016-03-05 NOTE — Telephone Encounter (Signed)
Needles for lantus pen  Needles and syringed for novolog [facility] [place]

## 2016-03-08 MED FILL — !LANTUS SOLOSTAR 100UNITS/M: 100 | 29 days supply | Qty: 6 | Fill #3

## 2016-03-08 MED FILL — SIMVASTATIN 20 MG TABLET: 20 | 30 days supply | Qty: 30 | Fill #0

## 2016-03-08 MED FILL — ?LISINOPRIL 20 MG TABLET: 20 | 30 days supply | Qty: 30 | Fill #2

## 2016-03-09 ENCOUNTER — Encounter: Payer: Self-pay | Admitting: Family Medicine

## 2016-03-09 ENCOUNTER — Ambulatory Visit: Payer: Self-pay | Attending: Family Medicine | Admitting: Family Medicine

## 2016-03-09 VITALS — BP 136/78 | HR 64 | Temp 97.6°F | Ht 72.0 in | Wt 179.4 lb

## 2016-03-09 DIAGNOSIS — M25562 Pain in left knee: Secondary | ICD-10-CM | POA: Insufficient documentation

## 2016-03-09 DIAGNOSIS — Z23 Encounter for immunization: Secondary | ICD-10-CM | POA: Insufficient documentation

## 2016-03-09 DIAGNOSIS — E108 Type 1 diabetes mellitus with unspecified complications: Secondary | ICD-10-CM | POA: Insufficient documentation

## 2016-03-09 DIAGNOSIS — Z7982 Long term (current) use of aspirin: Secondary | ICD-10-CM | POA: Insufficient documentation

## 2016-03-09 DIAGNOSIS — M25561 Pain in right knee: Secondary | ICD-10-CM | POA: Insufficient documentation

## 2016-03-09 DIAGNOSIS — Z794 Long term (current) use of insulin: Secondary | ICD-10-CM | POA: Insufficient documentation

## 2016-03-09 LAB — GLUCOSE, POCT (MANUAL RESULT ENTRY)
POC GLUCOSE: 326 mg/dL — AB (ref 70–99)
POC Glucose: 207 mg/dl — AB (ref 70–99)

## 2016-03-09 MED ORDER — INSULIN GLARGINE 100 UNIT/ML SOLOSTAR PEN: 23.0000 [IU] | PEN_INJECTOR | Freq: Every day | SUBCUTANEOUS | 3 refills | Status: DC

## 2016-03-09 MED ORDER — INSULIN ASPART 100 UNIT/ML ~~LOC~~ SOLN: 1.0000 [IU] | Freq: Three times a day (TID) | SUBCUTANEOUS | 3 refills | Status: DC

## 2016-03-09 MED ORDER — INSULIN ASPART 100 UNIT/ML ~~LOC~~ SOLN
5.0000 [IU] | Freq: Once | SUBCUTANEOUS | Status: AC
  Administered 2016-03-09: 5 [IU] via SUBCUTANEOUS

## 2016-03-09 NOTE — Patient Instructions (Addendum)
[place] was seen today for diabetes.  Diagnoses and all orders for this visit:  DM (diabetes mellitus), type 1 with complications (HCC) -     Glucose (CBG) -     insulin aspart (novoLOG) injection 5 Units; Inject 0.05 mLs (5 Units total) into the skin once. -     Insulin Glargine (LANTUS SOLOSTAR) 100 UNIT/ML Solostar Pen; Inject 23 Units into the skin daily at 10 pm. -     insulin aspart (NOVOLOG) 100 UNIT/ML injection; Inject 1-4 Units into the skin 3 (three) times daily before meals. If sugar > 120   Diabetes blood sugar goals  Fasting (in AM before breakfast, 8 hrs of no eating or drinking (except water or unsweetened coffee or tea): 90-110 2 hrs after meals: < 160,   No low sugars: nothing < 70   F/u in 6 weeks for diabetes   Dr. Armen PickupFunches

## 2016-03-09 NOTE — Progress Notes (Signed)
Subjective:  Patient ID: [name], male    DOB: [birth year]  Age: 40 y.o. MRN: [id number]  CC: Diabetes   HPI [place] presents for   1. Left knee pain: started 2 months ago he reports a feeling like his knee cap went out of place while playing basketball. He did not play ball for a few months. He played again 6 days ago and had swelling and joint laxity. He treated at home with heat and ice. Swelling has resolved.   2. IDDM type 1:  CHRONIC DIABETES  Disease Monitoring  Blood Sugar Ranges:   Fasting: 116  Post prandial: 173    Polyuria: yes, drink a lot of water    Visual problems: no   Medication Compliance: yes, but only 10 U of lantus over past 3 days since getting low   Medication Side Effects  Hypoglycemia: yes, down to 67,  6 days ago, down to 74,  5 days ago.     Social History  Substance Use Topics  . Smoking status: Never Smoker  . Smokeless tobacco: Not on file  . Alcohol use 0.6 oz/week    1 Glasses of wine per week     Comment: rarely    Outpatient Medications Prior to Visit  Medication Sig Dispense Refill  . aspirin 81 MG tablet Take 81 mg by mouth daily.    . Blood Glucose Monitoring Suppl (TRUE METRIX METER) W/DEVICE KIT 1 each by Does not apply route as needed. 1 kit 0  . divalproex (DEPAKOTE ER) 500 MG 24 hr tablet Take 3 tablets (1,500 mg total) by mouth daily. 270 tablet 3  . glucagon 1 MG injection Inject 1 mg into the muscle once as needed. 1 each 12  . glucose blood (TRUE METRIX BLOOD GLUCOSE TEST) test strip 1 each by Other route 3 (three) times daily. 100 each 11  . insulin aspart (NOVOLOG) 100 UNIT/ML injection Inject 1-4 Units into the skin 3 (three) times daily before meals. If sugar > 120 30 mL 3  . Insulin Glargine (LANTUS SOLOSTAR) 100 UNIT/ML Solostar Pen Inject 23 Units into the skin daily at 10 pm. 30 mL 3  . Insulin Pen Needle (B-D ULTRAFINE III SHORT PEN) 31G X 8 MM MISC 1 application by Does not apply route daily. 100 each 3    . Insulin Syringe-Needle U-100 (B-D INS SYRINGE 0.5CC/31GX5/16) 31G X 5/16" 0.5 ML MISC 1 each by Does not apply route 3 (three) times daily. 90 each 11  . lisinopril (PRINIVIL,ZESTRIL) 20 MG tablet Take 1 tablet (20 mg total) by mouth every morning. 90 tablet 3  . simvastatin (ZOCOR) 20 MG tablet Take 1 tablet (20 mg total) by mouth daily. 90 tablet 3  . TRUEPLUS LANCETS 28G MISC 1 each by Does not apply route 3 (three) times daily. 100 each 11  . aspirin 325 MG tablet Take 325 mg by mouth daily. Reported on 01/13/2016     Facility-Administered Medications Prior to Visit  Medication Dose Route Frequency Provider Last Rate Last Dose  . insulin aspart (novoLOG) injection 4 Units  4 Units Subcutaneous Once Janari Yamada, MD        ROS Review of Systems  Constitutional: Negative for chills, fatigue, fever and unexpected weight change.  HENT: Negative for dental problem.   Eyes: Negative for visual disturbance.  Respiratory: Negative for cough and shortness of breath.   Cardiovascular: Negative for chest pain, palpitations and leg swelling.  Gastrointestinal: Negative for  abdominal pain, blood in stool, constipation, diarrhea, nausea and vomiting.  Endocrine: Negative for polydipsia, polyphagia and polyuria.  Musculoskeletal: Positive for arthralgias. Negative for back pain, gait problem, myalgias and neck pain.  Skin: Negative for rash.  Allergic/Immunologic: Negative for immunocompromised state.  Neurological: Negative for seizures.  Hematological: Negative for adenopathy. Does not bruise/bleed easily.  Psychiatric/Behavioral: Positive for dysphoric mood. Negative for sleep disturbance and suicidal ideas. The patient is nervous/anxious.     Objective:  BP 136/78 (BP Location: Right Arm, Patient Position: Sitting, Cuff Size: Small)   Pulse 64   Temp 97.6 F (36.4 C) (Oral)   Ht 6' (1.829 m)   Wt 179 lb 6.4 oz (81.4 kg)   SpO2 99%   BMI 24.33 kg/m   BP/Weight 03/09/2016 01/13/2016  0/03/4075  Systolic BP 808 811 031  Diastolic BP 78 83 83  Wt. (Lbs) 179.4 178.8 184  BMI 24.33 24.24 25.67    Physical Exam  Constitutional: He appears well-developed and well-nourished. No distress.  HENT:  Head: Normocephalic and atraumatic.  Neck: Normal range of motion. Neck supple.  Cardiovascular: Normal rate, regular rhythm, normal heart sounds and intact distal pulses.   Pulmonary/Chest: Effort normal and breath sounds normal.  Musculoskeletal: He exhibits no edema.       Right knee: Normal.       Left knee: Normal.  Neurological: He is alert.  Skin: Skin is warm and dry. No rash noted. No erythema.  Psychiatric: He has a normal mood and affect.   Lab Results  Component Value Date   HGBA1C 9.2 01/13/2016   CBG 326  He ate 2 hrs ago and took 6 U of novolog at home   Treated with 7 U of novolog here   Repeat CBG   Assessment & Plan:   [name] was seen today for diabetes.  Diagnoses and all orders for this visit:  DM (diabetes mellitus), type 1 with complications ([place]) -     Glucose (CBG) -     insulin aspart (novoLOG) injection 5 Units; Inject 0.05 mLs (5 Units total) into the skin once. -     Insulin Glargine (LANTUS SOLOSTAR) 100 UNIT/ML Solostar Pen; Inject 23 Units into the skin daily at 10 pm. -     insulin aspart (NOVOLOG) 100 UNIT/ML injection; Inject 1-4 Units into the skin 3 (three) times daily before meals. If sugar > 120 -     POCT glucose (manual entry)  Encounter for immunization -     Flu Vaccine QUAD 36+ mos IM    Meds ordered this encounter  Medications  . insulin aspart (novoLOG) injection 5 Units  . Insulin Glargine (LANTUS SOLOSTAR) 100 UNIT/ML Solostar Pen    Sig: Inject 23 Units into the skin daily at 10 pm.    Dispense:  30 mL    Refill:  3  . insulin aspart (NOVOLOG) 100 UNIT/ML injection    Sig: Inject 1-4 Units into the skin 3 (three) times daily before meals. If sugar > 120    Dispense:  30 mL    Refill:  3    Follow-up:  Return in about 6 weeks (around 04/20/2016) for diabetes .   Boykin Nearing MD

## 2016-03-09 NOTE — Progress Notes (Signed)
Pain in left knee 

## 2016-03-16 DIAGNOSIS — M25569 Pain in unspecified knee: Secondary | ICD-10-CM | POA: Insufficient documentation

## 2016-03-16 NOTE — Assessment & Plan Note (Signed)
Uncontrolled IDDM type 1 with hyperglycemia due to med non compliance Refilled lantus Continue novolog

## 2016-03-16 NOTE — Assessment & Plan Note (Signed)
Knee pain with normal exam Compression and bracing recommended with exercise Reassurance provided

## 2016-04-20 MED FILL — NovoLOG 100 UNIT/ML SOLN: 100 | 30 days supply | Qty: 10 | Fill #1

## 2016-04-20 MED FILL — ?LISINOPRIL 20 MG TABLET: 20 | 30 days supply | Qty: 30 | Fill #3

## 2016-04-20 MED FILL — SIMVASTATIN 20 MG TABLET: 20 | 30 days supply | Qty: 30 | Fill #1

## 2016-04-20 MED FILL — DIVALPROEX SOD ER 500 MG TA: 500 | 30 days supply | Qty: 90 | Fill #2

## 2016-04-23 ENCOUNTER — Other Ambulatory Visit: Payer: Self-pay | Admitting: Pharmacist

## 2016-04-23 MED ORDER — BASAGLAR KWIKPEN 100 UNIT/ML ~~LOC~~ SOPN: 23.0000 [IU] | PEN_INJECTOR | Freq: Every day | SUBCUTANEOUS | 3 refills | Status: DC

## 2016-06-06 MED FILL — SIMVASTATIN 20 MG TABLET: 20 | 30 days supply | Qty: 30 | Fill #2

## 2016-06-06 MED FILL — LISINOPRIL 20 MG TABLET: 20 | 30 days supply | Qty: 30 | Fill #4

## 2016-07-18 MED FILL — LISINOPRIL 20 MG TABLET: 20 | 30 days supply | Qty: 30 | Fill #5

## 2016-07-18 MED FILL — SIMVASTATIN 20 MG TABLET: 20 | 30 days supply | Qty: 30 | Fill #3

## 2016-07-18 MED FILL — NovoLOG 100 UNIT/ML SOLN: 100 | 30 days supply | Qty: 10 | Fill #2

## 2016-07-18 MED FILL — DIVALPROEX SOD ER 500 MG TA: 500 | 30 days supply | Qty: 90 | Fill #3

## 2016-07-30 ENCOUNTER — Ambulatory Visit: Payer: BLUE CROSS/BLUE SHIELD | Attending: Family Medicine | Admitting: Family Medicine

## 2016-07-30 VITALS — BP 148/76 | HR 77 | Temp 98.1°F | Ht 72.0 in | Wt 174.8 lb

## 2016-07-30 DIAGNOSIS — E108 Type 1 diabetes mellitus with unspecified complications: Secondary | ICD-10-CM | POA: Diagnosis not present

## 2016-07-30 DIAGNOSIS — Z79899 Other long term (current) drug therapy: Secondary | ICD-10-CM | POA: Diagnosis not present

## 2016-07-30 DIAGNOSIS — Z794 Long term (current) use of insulin: Secondary | ICD-10-CM | POA: Insufficient documentation

## 2016-07-30 DIAGNOSIS — Z7982 Long term (current) use of aspirin: Secondary | ICD-10-CM | POA: Diagnosis not present

## 2016-07-30 DIAGNOSIS — E109 Type 1 diabetes mellitus without complications: Secondary | ICD-10-CM | POA: Insufficient documentation

## 2016-07-30 LAB — POCT GLYCOSYLATED HEMOGLOBIN (HGB A1C): Hemoglobin A1C: 9.8

## 2016-07-30 LAB — GLUCOSE, POCT (MANUAL RESULT ENTRY): POC Glucose: 269 mg/dl — AB (ref 70–99)

## 2016-07-30 MED ORDER — BASAGLAR KWIKPEN 100 UNIT/ML ~~LOC~~ SOPN: 28.0000 [IU] | PEN_INJECTOR | Freq: Every day | SUBCUTANEOUS | 3 refills | Status: DC

## 2016-07-30 NOTE — Progress Notes (Signed)
Subjective:  Patient ID: [name], male    DOB: 01-Sep-1975  Age: 41 y.o. MRN: [id number]  CC: Diabetes   HPI [name] presents for   1. IDDM type 1:  CHRONIC DIABETES  Disease Monitoring  Blood Sugar Ranges:   Fasting: 160-190  Post prandial: 150-170   Polyuria: yes, drink a lot of water    Visual problems: no   Medication Compliance: yes, 23 U of lantus daily. Taking 3 U of novolog with breakfast, 2 U with lunch.  Medication Side Effects  Hypoglycemia: 37   Social History  Substance Use Topics  . Smoking status: Never Smoker  . Smokeless tobacco: Not on file  . Alcohol use 0.6 oz/week    1 Glasses of wine per week     Comment: rarely    Outpatient Medications Prior to Visit  Medication Sig Dispense Refill  . aspirin 81 MG tablet Take 81 mg by mouth daily.    . Blood Glucose Monitoring Suppl (TRUE METRIX METER) W/DEVICE KIT 1 each by Does not apply route as needed. 1 kit 0  . divalproex (DEPAKOTE ER) 500 MG 24 hr tablet Take 3 tablets (1,500 mg total) by mouth daily. 270 tablet 3  . glucose blood (TRUE METRIX BLOOD GLUCOSE TEST) test strip 1 each by Other route 3 (three) times daily. 100 each 11  . insulin aspart (NOVOLOG) 100 UNIT/ML injection Inject 1-4 Units into the skin 3 (three) times daily before meals. If sugar > 120 30 mL 3  . Insulin Pen Needle (B-D ULTRAFINE III SHORT PEN) 31G X 8 MM MISC 1 application by Does not apply route daily. 100 each 3  . Insulin Syringe-Needle U-100 (B-D INS SYRINGE 0.5CC/31GX5/16) 31G X 5/16" 0.5 ML MISC 1 each by Does not apply route 3 (three) times daily. 90 each 11  . lisinopril (PRINIVIL,ZESTRIL) 20 MG tablet Take 1 tablet (20 mg total) by mouth every morning. 90 tablet 3  . simvastatin (ZOCOR) 20 MG tablet Take 1 tablet (20 mg total) by mouth daily. 90 tablet 3  . TRUEPLUS LANCETS 28G MISC 1 each by Does not apply route 3 (three) times daily. 100 each 11  . aspirin 325 MG tablet Take 325 mg by mouth daily. Reported on  01/13/2016    . glucagon 1 MG injection Inject 1 mg into the muscle once as needed. (Patient not taking: Reported on 07/30/2016) 1 each 12  . Insulin Glargine (BASAGLAR KWIKPEN) 100 UNIT/ML SOPN Inject 0.23 mLs (23 Units total) into the skin at bedtime. (Patient not taking: Reported on 07/30/2016) 15 mL 3   Facility-Administered Medications Prior to Visit  Medication Dose Route Frequency Provider Last Rate Last Dose  . insulin aspart (novoLOG) injection 4 Units  4 Units Subcutaneous Once [place] Shannahan, Smith        ROS Review of Systems  Constitutional: Negative for chills, fatigue, fever and unexpected weight change.  HENT: Negative for dental problem.   Eyes: Negative for visual disturbance.  Respiratory: Negative for cough and shortness of breath.   Cardiovascular: Negative for chest pain, palpitations and leg swelling.  Gastrointestinal: Negative for abdominal pain, blood in stool, constipation, diarrhea, nausea and vomiting.  Endocrine: Negative for polydipsia, polyphagia and polyuria.  Musculoskeletal: Positive for arthralgias. Negative for back pain, gait problem, myalgias and neck pain.  Skin: Negative for rash.  Allergic/Immunologic: Negative for immunocompromised state.  Neurological: Negative for seizures.  Hematological: Negative for adenopathy. Does not bruise/bleed easily.  Psychiatric/Behavioral: Positive  for dysphoric mood. Negative for sleep disturbance and suicidal ideas. The patient is nervous/anxious.     Objective:  BP (!) 148/76 (BP Location: Left Arm, Patient Position: Sitting, Cuff Size: Small)   Pulse 77   Temp 98.1 F (36.7 C) (Oral)   Ht 6' (1.829 m)   Wt 174 lb 12.8 oz (79.3 kg)   SpO2 97%   BMI 23.71 kg/m   BP/Weight 07/30/2016 10/12/1458 10/13/9985  Systolic BP 215 872 761  Diastolic BP 76 78 83  Wt. (Lbs) 174.8 179.4 178.8  BMI 23.71 24.33 24.24    Physical Exam  Constitutional: He appears well-developed and well-nourished. No distress.  HENT:    Head: Normocephalic and atraumatic.  Neck: Normal range of motion. Neck supple.  Cardiovascular: Normal rate, regular rhythm, normal heart sounds and intact distal pulses.   Pulmonary/Chest: Effort normal and breath sounds normal.  Musculoskeletal: He exhibits no edema.       Right knee: Normal.       Left knee: Normal.  Neurological: He is alert.  Skin: Skin is warm and dry. No rash noted. No erythema.  Psychiatric: He has a normal mood and affect.   Lab Results  Component Value Date   HGBA1C 9.2 01/13/2016   Lab Results  Component Value Date   HGBA1C 9.8 07/30/2016   CBG 269  Assessment & Plan:   [name] was seen today for diabetes.  Diagnoses and all orders for this visit:  DM (diabetes mellitus), type 1 with complications ([name]) -     POCT glucose (manual entry) -     POCT glycosylated hemoglobin (Hb A1C) -     Insulin Glargine (BASAGLAR KWIKPEN) 100 UNIT/ML SOPN; Inject 0.28 mLs (28 Units total) into the skin daily.    No orders of the defined types were placed in this encounter.   Follow-up: Return in about 3 months (around 10/28/2016) for diabetes .   [name]

## 2016-07-30 NOTE — Progress Notes (Signed)
Pt is having cramping in his shoulders and feet. Pt pinky finger on right hand is swollen.

## 2016-07-30 NOTE — Patient Instructions (Addendum)
[name] was seen today for diabetes.  Diagnoses and all orders for this visit:  DM (diabetes mellitus), type 1 with complications ([facility]) -     POCT glucose (manual entry) -     POCT glycosylated hemoglobin (Hb A1C) -     Insulin Glargine (BASAGLAR KWIKPEN) 100 UNIT/ML SOPN; Inject 0.28 mLs (28 Units total) into the skin daily.   F/u in 3 months for diabetes   Dr .Armen PickupFunches

## 2016-07-31 ENCOUNTER — Encounter (HOSPITAL_COMMUNITY): Payer: Self-pay

## 2016-07-31 ENCOUNTER — Emergency Department (HOSPITAL_COMMUNITY): Payer: BLUE CROSS/BLUE SHIELD

## 2016-07-31 ENCOUNTER — Emergency Department (HOSPITAL_COMMUNITY)
Admission: EM | Admit: 2016-07-31 | Discharge: 2016-07-31 | Disposition: A | Discharge status: Home / Self Care | Payer: BLUE CROSS/BLUE SHIELD | Attending: Emergency Medicine | Admitting: Emergency Medicine

## 2016-07-31 DIAGNOSIS — Y999 Unspecified external cause status: Secondary | ICD-10-CM | POA: Insufficient documentation

## 2016-07-31 DIAGNOSIS — S56911A Strain of unspecified muscles, fascia and tendons at forearm level, right arm, initial encounter: Secondary | ICD-10-CM | POA: Diagnosis not present

## 2016-07-31 DIAGNOSIS — Y929 Unspecified place or not applicable: Secondary | ICD-10-CM | POA: Diagnosis not present

## 2016-07-31 DIAGNOSIS — E119 Type 2 diabetes mellitus without complications: Secondary | ICD-10-CM | POA: Diagnosis not present

## 2016-07-31 DIAGNOSIS — Y939 Activity, unspecified: Secondary | ICD-10-CM | POA: Insufficient documentation

## 2016-07-31 DIAGNOSIS — I10 Essential (primary) hypertension: Secondary | ICD-10-CM | POA: Insufficient documentation

## 2016-07-31 DIAGNOSIS — S59901A Unspecified injury of right elbow, initial encounter: Secondary | ICD-10-CM | POA: Diagnosis present

## 2016-07-31 NOTE — Assessment & Plan Note (Signed)
Declined with elevated A1c Plan: Increase lantus to 28 U daily
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
## 2016-08-02 ENCOUNTER — Encounter (HOSPITAL_COMMUNITY): Payer: Self-pay | Admitting: Emergency Medicine

## 2016-08-02 ENCOUNTER — Emergency Department (HOSPITAL_COMMUNITY)
Admission: EM | Admit: 2016-08-02 | Discharge: 2016-08-02 | Disposition: A | Discharge status: Home / Self Care | Payer: BLUE CROSS/BLUE SHIELD | Attending: Emergency Medicine | Admitting: Emergency Medicine

## 2016-08-02 DIAGNOSIS — M79602 Pain in left arm: Secondary | ICD-10-CM | POA: Insufficient documentation

## 2016-08-02 DIAGNOSIS — Z794 Long term (current) use of insulin: Secondary | ICD-10-CM | POA: Diagnosis not present

## 2016-08-02 DIAGNOSIS — Y929 Unspecified place or not applicable: Secondary | ICD-10-CM | POA: Insufficient documentation

## 2016-08-02 DIAGNOSIS — Z7982 Long term (current) use of aspirin: Secondary | ICD-10-CM | POA: Diagnosis not present

## 2016-08-02 DIAGNOSIS — Y939 Activity, unspecified: Secondary | ICD-10-CM | POA: Insufficient documentation

## 2016-08-02 DIAGNOSIS — E119 Type 2 diabetes mellitus without complications: Secondary | ICD-10-CM | POA: Diagnosis not present

## 2016-08-02 DIAGNOSIS — W228XXA Striking against or struck by other objects, initial encounter: Secondary | ICD-10-CM | POA: Insufficient documentation

## 2016-08-02 DIAGNOSIS — Y999 Unspecified external cause status: Secondary | ICD-10-CM | POA: Diagnosis not present

## 2016-08-02 DIAGNOSIS — I10 Essential (primary) hypertension: Secondary | ICD-10-CM | POA: Diagnosis not present

## 2016-08-02 DIAGNOSIS — S59912A Unspecified injury of left forearm, initial encounter: Secondary | ICD-10-CM | POA: Diagnosis present

## 2016-08-02 DIAGNOSIS — Z79899 Other long term (current) drug therapy: Secondary | ICD-10-CM | POA: Insufficient documentation

## 2016-08-02 MED ORDER — IBUPROFEN 600 MG PO TABS: 600.0000 mg | ORAL_TABLET | Freq: Four times a day (QID) | ORAL | 0 refills | Status: DC | PRN

## 2016-08-02 MED ORDER — IBUPROFEN 800 MG PO TABS
800.0000 mg | ORAL_TABLET | Freq: Once | ORAL | Status: AC
  Administered 2016-08-02: 800 mg via ORAL
  Filled 2016-08-02: qty 1

## 2016-08-02 MED ORDER — DEXAMETHASONE 4 MG PO TABS: 4.0000 mg | ORAL_TABLET | Freq: Two times a day (BID) | ORAL | 0 refills | Status: DC

## 2016-08-02 MED ORDER — ACETAMINOPHEN 325 MG PO TABS
650.0000 mg | ORAL_TABLET | Freq: Once | ORAL | Status: AC
  Administered 2016-08-02: 650 mg via ORAL
  Filled 2016-08-02: qty 2

## 2016-08-02 MED ORDER — DEXAMETHASONE SODIUM PHOSPHATE 4 MG/ML IJ SOLN
8.0000 mg | Freq: Once | INTRAMUSCULAR | Status: AC
  Administered 2016-08-02: 8 mg via INTRAMUSCULAR
  Filled 2016-08-02: qty 2
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
## 2016-08-02 NOTE — Discharge Instructions (Signed)
Please use Decadron and ibuprofen daily with food. Use your sling over the next 5 days. Please see Dr. Romeo AppleHarrison for additional evaluation if not improving.
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
## 2016-09-19 MED FILL — DIVALPROEX SOD ER 500 MG TA: 500 | 30 days supply | Qty: 90 | Fill #4

## 2016-09-19 MED FILL — SIMVASTATIN 20 MG TABLET: 20 | 30 days supply | Qty: 30 | Fill #4

## 2016-09-19 MED FILL — ?LISINOPRIL 20 MG TABLET: 20 | 30 days supply | Qty: 30 | Fill #6

## 2016-10-02 ENCOUNTER — Encounter: Payer: Self-pay | Admitting: Family Medicine

## 2016-10-02 ENCOUNTER — Other Ambulatory Visit: Payer: Self-pay | Admitting: Family Medicine

## 2016-10-02 ENCOUNTER — Ambulatory Visit: Payer: BLUE CROSS/BLUE SHIELD | Attending: Family Medicine | Admitting: Family Medicine

## 2016-10-02 VITALS — BP 152/68 | HR 61 | Temp 98.3°F | Ht 72.0 in | Wt 180.8 lb

## 2016-10-02 DIAGNOSIS — R1110 Vomiting, unspecified: Secondary | ICD-10-CM | POA: Diagnosis not present

## 2016-10-02 DIAGNOSIS — Z7982 Long term (current) use of aspirin: Secondary | ICD-10-CM | POA: Diagnosis not present

## 2016-10-02 DIAGNOSIS — E162 Hypoglycemia, unspecified: Secondary | ICD-10-CM | POA: Diagnosis not present

## 2016-10-02 DIAGNOSIS — Z794 Long term (current) use of insulin: Secondary | ICD-10-CM | POA: Insufficient documentation

## 2016-10-02 DIAGNOSIS — E108 Type 1 diabetes mellitus with unspecified complications: Secondary | ICD-10-CM

## 2016-10-02 DIAGNOSIS — E1065 Type 1 diabetes mellitus with hyperglycemia: Secondary | ICD-10-CM | POA: Diagnosis present

## 2016-10-02 DIAGNOSIS — Z79899 Other long term (current) drug therapy: Secondary | ICD-10-CM | POA: Diagnosis not present

## 2016-10-02 DIAGNOSIS — R109 Unspecified abdominal pain: Secondary | ICD-10-CM | POA: Diagnosis not present

## 2016-10-02 DIAGNOSIS — K529 Noninfective gastroenteritis and colitis, unspecified: Secondary | ICD-10-CM

## 2016-10-02 LAB — POCT GLYCOSYLATED HEMOGLOBIN (HGB A1C): Hemoglobin A1C: 10.4

## 2016-10-02 LAB — GLUCOSE, POCT (MANUAL RESULT ENTRY): POC GLUCOSE: 143 mg/dL — AB (ref 70–99)

## 2016-10-02 MED ORDER — ONDANSETRON HCL 4 MG PO TABS: 4.0000 mg | ORAL_TABLET | Freq: Three times a day (TID) | ORAL | 0 refills | Status: DC | PRN

## 2016-10-02 NOTE — Patient Instructions (Signed)

## 2016-10-03 LAB — CMP14+EGFR
ALK PHOS: 45 IU/L (ref 39–117)
ALT: 15 IU/L (ref 0–44)
AST: 21 IU/L (ref 0–40)
Albumin/Globulin Ratio: 1.7 (ref 1.2–2.2)
Albumin: 4.1 g/dL (ref 3.5–5.5)
BILIRUBIN TOTAL: 0.6 mg/dL (ref 0.0–1.2)
BUN/Creatinine Ratio: 12 (ref 9–20)
BUN: 15 mg/dL (ref 6–20)
CO2: 25 mmol/L (ref 18–29)
CREATININE: 1.21 mg/dL (ref 0.76–1.27)
Calcium: 9.1 mg/dL (ref 8.7–10.2)
Chloride: 99 mmol/L (ref 96–106)
GFR calc Af Amer: 87 mL/min/{1.73_m2} (ref >59)
GFR calc non Af Amer: 75 mL/min/{1.73_m2} (ref >59)
GLUCOSE: 108 mg/dL — AB (ref 65–99)
Globulin, Total: 2.4 g/dL (ref 1.5–4.5)
Potassium: 4.3 mmol/L (ref 3.5–5.2)
Sodium: 139 mmol/L (ref 134–144)
Total Protein: 6.5 g/dL (ref 6.0–8.5)

## 2016-10-03 NOTE — Progress Notes (Signed)
Subjective:  Patient ID: [name], male    DOB: 1975/10/04  Age: 41 y.o. MRN: [id number]  CC: Diabetes (paramedics came to his job- passed out BS 37 gave glucagon); Emesis; Diarrhea (stopped yesterday); and Abdominal Pain (stopped yesterday)   HPI [place] is a patient with type 1 diabetes mellitus (A1c 10.4) who presents today with a four-day history of abdominal pain, diarrhea, nausea and vomiting. Last episode of diarrhea was yesterday and last episode of vomiting was today. Diarrhea responded to the use of Pepto-Bismol. Of note 4 days ago he had a hypoglycemic episode at work due to administration of excessive NovoLog and had some fast food bought from [place] by one of his coworkers which he ate.  Nausea, vomiting and diarrhea have since resolved since this morning when he called for an appointment. Denies fever, lethargy.  Outpatient Medications Prior to Visit  Medication Sig Dispense Refill  . Blood Glucose Monitoring Suppl (TRUE METRIX METER) W/DEVICE KIT 1 each by Does not apply route as needed. 1 kit 0  . dexamethasone (DECADRON) 4 MG tablet Take 1 tablet (4 mg total) by mouth 2 (two) times daily with a meal. 12 tablet 0  . divalproex (DEPAKOTE ER) 500 MG 24 hr tablet Take 3 tablets (1,500 mg total) by mouth daily. (Patient taking differently: Take 1,000 mg by mouth daily. ) 270 tablet 3  . glucose blood (TRUE METRIX BLOOD GLUCOSE TEST) test strip 1 each by Other route 3 (three) times daily. 100 each 11  . ibuprofen (ADVIL,MOTRIN) 600 MG tablet Take 1 tablet (600 mg total) by mouth every 6 (six) hours as needed. 30 tablet 0  . insulin aspart (NOVOLOG) 100 UNIT/ML injection Inject 1-4 Units into the skin 3 (three) times daily before meals. If sugar > 120 30 mL 3  . Insulin Glargine (BASAGLAR KWIKPEN) 100 UNIT/ML SOPN Inject 0.28 mLs (28 Units total) into the skin daily. 15 mL 3  . Insulin Pen Needle (B-D ULTRAFINE III SHORT PEN) 31G X 8 MM MISC 1 application by Does not  apply route daily. 100 each 3  . Insulin Syringe-Needle U-100 (B-D INS SYRINGE 0.5CC/31GX5/16) 31G X 5/16" 0.5 ML MISC 1 each by Does not apply route 3 (three) times daily. 90 each 11  . lisinopril (PRINIVIL,ZESTRIL) 20 MG tablet Take 1 tablet (20 mg total) by mouth every morning. 90 tablet 3  . Omega-3 Fatty Acids (FISH OIL PO) Take 1 capsule by mouth daily.    . simvastatin (ZOCOR) 20 MG tablet Take 1 tablet (20 mg total) by mouth daily. 90 tablet 3  . TRUEPLUS LANCETS 28G MISC 1 each by Does not apply route 3 (three) times daily. 100 each 11  . aspirin 81 MG tablet Take 81 mg by mouth daily.     Facility-Administered Medications Prior to Visit  Medication Dose Route Frequency Provider Last Rate Last Dose  . insulin aspart (novoLOG) injection 4 Units  4 Units Subcutaneous Once Josalyn Funches, MD        ROS Review of Systems  Constitutional: Negative for activity change and appetite change.  HENT: Negative for sinus pressure and sore throat.   Respiratory: Negative for chest tightness, shortness of breath and wheezing.   Cardiovascular: Negative for chest pain and palpitations.  Gastrointestinal: Negative for abdominal distention, abdominal pain and constipation.  Genitourinary: Negative.   Musculoskeletal: Negative.   Psychiatric/Behavioral: Negative for behavioral problems and dysphoric mood.    Objective:  BP (!) 152/68 (BP Location: Right  Arm, Patient Position: Sitting, Cuff Size: Small)   Pulse 61   Temp 98.3 F (36.8 C) (Oral)   Ht 6' (1.829 m)   Wt 180 lb 12.8 oz (82 kg)   SpO2 100%   BMI 24.52 kg/m   BP/Weight 10/02/2016 08/02/2016 8/34/3735  Systolic BP 789 784 784  Diastolic BP 68 85 82  Wt. (Lbs) 180.8 174 174  BMI 24.52 23.6 23.6      Physical Exam  Constitutional: He is oriented to person, place, and time. He appears well-developed and well-nourished.  Cardiovascular: Normal rate, normal heart sounds and intact distal pulses.   No murmur  heard. Pulmonary/Chest: Effort normal and breath sounds normal. He has no wheezes. He has no rales. He exhibits no tenderness.  Abdominal: Soft. Bowel sounds are normal. He exhibits no distension and no mass. There is no tenderness.  Musculoskeletal: Normal range of motion.  Neurological: He is alert and oriented to person, place, and time.     Lab Results  Component Value Date   HGBA1C 10.4 10/02/2016    Assessment & Plan:   1. DM (diabetes mellitus), type 1 with complications (HCC) [id number] of 10.4 Emphasize compliance with medications which is affected by his work schedule. Diabetic diet - Glucose (CBG) - HgB A1c  2. Gastroenteritis Resolving Placed on Zofran - CMP14+EGFR - ondansetron (ZOFRAN) 4 MG tablet; Take 1 tablet (4 mg total) by mouth every 8 (eight) hours as needed for nausea or vomiting.  Dispense: 20 tablet; Refill: 0   Meds ordered this encounter  Medications  . ondansetron (ZOFRAN) 4 MG tablet    Sig: Take 1 tablet (4 mg total) by mouth every 8 (eight) hours as needed for nausea or vomiting.    Dispense:  20 tablet    Refill:  0    Follow-up: Return in about 1 month (around 11/02/2016) for Follow-up of diabetes mellitus with Dr Adrian Blackwater.   Arnoldo Morale MD

## 2016-10-08 ENCOUNTER — Telehealth: Payer: Self-pay

## 2016-10-08 NOTE — Telephone Encounter (Signed)
-----   Message from Jaclyn Shaggy, MD sent at 10/03/2016  1:07 PM EDT ----- Please inform the patient that labs are normal. Thank you.

## 2016-10-08 NOTE — Telephone Encounter (Signed)
Writer called patient to discuss labs. LVM for patient to call back.

## 2016-10-11 NOTE — Progress Notes (Signed)
After several attempts of trying to reach patient to discuss his lab results, Clinical research associate mailed the results along with a letter to patient.

## 2016-10-24 ENCOUNTER — Telehealth: Payer: Self-pay | Admitting: Family Medicine

## 2016-10-24 NOTE — Telephone Encounter (Signed)
Pt was called and a VM was left informing pt to return phone call for more information.

## 2016-10-24 NOTE — Telephone Encounter (Signed)
Patient calling requesting a referral to an endocrinologist for his DM

## 2016-10-26 NOTE — Telephone Encounter (Signed)
Pt. Returned nurse call. Please f/u °

## 2016-11-05 ENCOUNTER — Encounter: Payer: Self-pay | Admitting: Family Medicine

## 2016-11-05 ENCOUNTER — Ambulatory Visit: Payer: BLUE CROSS/BLUE SHIELD | Attending: Family Medicine | Admitting: Family Medicine

## 2016-11-05 VITALS — BP 146/67 | HR 71 | Temp 98.0°F | Ht 72.0 in | Wt 182.8 lb

## 2016-11-05 DIAGNOSIS — Z7982 Long term (current) use of aspirin: Secondary | ICD-10-CM | POA: Insufficient documentation

## 2016-11-05 DIAGNOSIS — R55 Syncope and collapse: Secondary | ICD-10-CM | POA: Insufficient documentation

## 2016-11-05 DIAGNOSIS — Z794 Long term (current) use of insulin: Secondary | ICD-10-CM | POA: Insufficient documentation

## 2016-11-05 DIAGNOSIS — F319 Bipolar disorder, unspecified: Secondary | ICD-10-CM | POA: Insufficient documentation

## 2016-11-05 DIAGNOSIS — I10 Essential (primary) hypertension: Secondary | ICD-10-CM | POA: Insufficient documentation

## 2016-11-05 DIAGNOSIS — E108 Type 1 diabetes mellitus with unspecified complications: Secondary | ICD-10-CM | POA: Diagnosis not present

## 2016-11-05 DIAGNOSIS — E1065 Type 1 diabetes mellitus with hyperglycemia: Secondary | ICD-10-CM | POA: Insufficient documentation

## 2016-11-05 DIAGNOSIS — E7800 Pure hypercholesterolemia, unspecified: Secondary | ICD-10-CM | POA: Insufficient documentation

## 2016-11-05 LAB — GLUCOSE, POCT (MANUAL RESULT ENTRY)
POC Glucose: 220 mg/dl — AB (ref 70–99)
POC Glucose: 315 mg/dl — AB (ref 70–99)

## 2016-11-05 LAB — POCT GLYCOSYLATED HEMOGLOBIN (HGB A1C): Hemoglobin A1C: 9.9

## 2016-11-05 LAB — POCT UA - MICROALBUMIN
Albumin/Creatinine Ratio, Urine, POC: 300
Creatinine, POC: 50 mg/dL
Microalbumin Ur, POC: 150 mg/L

## 2016-11-05 MED ORDER — AMLODIPINE BESYLATE 5 MG PO TABS: 5.0000 mg | ORAL_TABLET | Freq: Every day | ORAL | 3 refills | Status: DC

## 2016-11-05 MED ORDER — SIMVASTATIN 20 MG PO TABS: 20.0000 mg | ORAL_TABLET | Freq: Every day | ORAL | 3 refills | Status: DC

## 2016-11-05 MED ORDER — LISINOPRIL 20 MG PO TABS: 20.0000 mg | ORAL_TABLET | Freq: Every morning | ORAL | 3 refills | Status: DC

## 2016-11-05 MED ORDER — BASAGLAR KWIKPEN 100 UNIT/ML ~~LOC~~ SOPN: 31.0000 [IU] | PEN_INJECTOR | Freq: Every day | SUBCUTANEOUS | 3 refills | Status: DC

## 2016-11-05 MED ORDER — INSULIN ASPART 100 UNIT/ML ~~LOC~~ SOLN
4.0000 [IU] | Freq: Once | SUBCUTANEOUS | Status: AC
  Administered 2016-11-05: 4 [IU] via SUBCUTANEOUS

## 2016-11-05 NOTE — Assessment & Plan Note (Signed)
A: HTN elevated BP Med: compliant P: Continue lisinopril 20 mg daily Add Norvasc 10 mg daily

## 2016-11-05 NOTE — Progress Notes (Signed)
Subjective:  Patient ID: [name], male    DOB: 1975/10/18  Age: 41 y.o. MRN: [id number]  CC: Diabetes   HPI [name] Blood[place] has HTN, HLD, diabetes type 1, bipolar disorder he  presents for   1. Insulin dependent diabetes type 1 uncontrolled: reports labile CBGs. Had syncope episode with CBG of 37 at work. This occurred about 2 months ago. He was moving and working 12 hour shifts at the time.he was at work. He forgot his lantus. He took novolog 1-2 U every 3-4 hrs while at work. At the end of the day his CBG was 37, EMS was called, checked again to 45.  sugars was running  Took 2 U of novolog at 9 AM after breakfast. Took lantus 28 U. Had a lunch of steak, greenbeans, carrots, potatoes and 6 oz Gatorade for lunch. He reports being afraid to take novolog at work. 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 He reports having a insulin pump at age 69. He reports his daily average is 206. His fasting sugars are in the 160s. He denies foot numbness. He reports soreness in great toes when he wears his steel toed work boots.  2.  HTN: taking lisinopril 20 mg daily. No HA, CP or SOB.   Social History  Substance Use Topics  . Smoking status: Never Smoker  . Smokeless tobacco: Never Used  . Alcohol use 0.6 oz/week    1 Glasses of wine per week     Comment: rarely    Outpatient Medications Prior to Visit  Medication Sig Dispense Refill  . aspirin 81 MG tablet Take 81 mg by mouth daily.    . Blood Glucose Monitoring Suppl (TRUE METRIX METER) W/DEVICE KIT 1 each by Does not apply route as needed. 1 kit 0  . dexamethasone (DECADRON) 4 MG tablet Take 1 tablet (4 mg total) by mouth 2 (two) times daily with a meal. 12 tablet 0  . divalproex (DEPAKOTE ER) 500 MG 24 hr tablet Take 3 tablets (1,500 mg total) by mouth daily. (Patient taking differently: Take 1,000 mg by mouth daily. ) 270 tablet 3  . glucose blood (TRUE METRIX BLOOD GLUCOSE TEST) test strip 1 each by Other route  3 (three) times daily. 100 each 11  . ibuprofen (ADVIL,MOTRIN) 600 MG tablet Take 1 tablet (600 mg total) by mouth every 6 (six) hours as needed. 30 tablet 0  . insulin aspart (NOVOLOG) 100 UNIT/ML injection Inject 1-4 Units into the skin 3 (three) times daily before meals. If sugar > 120 30 mL 3  . Insulin Glargine (BASAGLAR KWIKPEN) 100 UNIT/ML SOPN Inject 0.28 mLs (28 Units total) into the skin daily. 15 mL 3  . Insulin Pen Needle (B-D ULTRAFINE III SHORT PEN) 31G X 8 MM MISC 1 application by Does not apply route daily. 100 each 3  . Insulin Syringe-Needle U-100 (B-D INS SYRINGE 0.5CC/31GX5/16) 31G X 5/16" 0.5 ML MISC 1 each by Does not apply route 3 (three) times daily. 90 each 11  . lisinopril (PRINIVIL,ZESTRIL) 20 MG tablet Take 1 tablet (20 mg total) by mouth every morning. 90 tablet 3  . Omega-3 Fatty Acids (FISH OIL PO) Take 1 capsule by mouth daily.    . ondansetron (ZOFRAN) 4 MG tablet Take 1 tablet (4 mg total) by mouth every 8 (eight) hours as needed for nausea or vomiting. 20 tablet 0  . simvastatin (ZOCOR) 20 MG tablet Take 1 tablet (20  mg total) by mouth daily. 90 tablet 3  . TRUEPLUS LANCETS 28G MISC 1 each by Does not apply route 3 (three) times daily. 100 each 11   Facility-Administered Medications Prior to Visit  Medication Dose Route Frequency Provider Last Rate Last Dose  . insulin aspart (novoLOG) injection 4 Units  4 Units Subcutaneous Once Sonia Stickels, MD        ROS Review of Systems  Constitutional: Negative for chills, fatigue, fever and unexpected weight change.  HENT: Negative for dental problem.   Eyes: Negative for visual disturbance.  Respiratory: Negative for cough and shortness of breath.   Cardiovascular: Negative for chest pain, palpitations and leg swelling.  Gastrointestinal: Negative for abdominal pain, blood in stool, constipation, diarrhea, nausea and vomiting.  Endocrine: Negative for polydipsia, polyphagia and polyuria.  Musculoskeletal:  Positive for arthralgias. Negative for back pain, gait problem, myalgias and neck pain.  Skin: Negative for rash.  Allergic/Immunologic: Negative for immunocompromised state.  Neurological: Negative for seizures.  Hematological: Negative for adenopathy. Does not bruise/bleed easily.  Psychiatric/Behavioral: Positive for dysphoric mood. Negative for sleep disturbance and suicidal ideas. The patient is nervous/anxious.     Objective:  BP (!) 146/67   Pulse 71   Temp 98 F (36.7 C) (Oral)   Ht 6' (1.829 m)   Wt 182 lb 12.8 oz (82.9 kg)   SpO2 97%   BMI 24.79 kg/m   BP/Weight 11/05/2016 10/02/2016 5/72/6203  Systolic BP 559 741 638  Diastolic BP 67 68 85  Wt. (Lbs) 182.8 180.8 174  BMI 24.79 24.52 23.6    Physical Exam  Constitutional: He appears well-developed and well-nourished. No distress.  HENT:  Head: Normocephalic and atraumatic.  Neck: Normal range of motion. Neck supple.  Cardiovascular: Normal rate, regular rhythm, normal heart sounds and intact distal pulses.   Pulmonary/Chest: Effort normal and breath sounds normal.  Musculoskeletal: He exhibits no edema.       Right knee: Normal.       Left knee: Normal.  Neurological: He is alert.  Skin: Skin is warm and dry. No rash noted. No erythema.  Psychiatric: He has a normal mood and affect.    Lab Results  Component Value Date   HGBA1C 10.4 10/02/2016   Lab Results  Component Value Date   HGBA1C 9.9 11/05/2016    CBG 315  Treated with 4 U of novolog  Repeat CBG 220 Assessment & Plan:  [name] was seen today for diabetes.  Diagnoses and all orders for this visit:  DM (diabetes mellitus), type 1 with complications ([place]) -     POCT glucose (manual entry) -     insulin aspart (novoLOG) injection 4 Units; Inject 0.04 mLs (4 Units total) into the skin once. -     POCT glycosylated hemoglobin (Hb A1C) -     Cancel: Ambulatory referral to Endocrinology -     POCT UA - Microalbumin -     Ambulatory referral to  Endocrinology -     Insulin Glargine (BASAGLAR KWIKPEN) 100 UNIT/ML SOPN; Inject 0.31 mLs (31 Units total) into the skin daily.  Pure hypercholesterolemia -     simvastatin (ZOCOR) 20 MG tablet; Take 1 tablet (20 mg total) by mouth daily.  Essential hypertension, benign -     lisinopril (PRINIVIL,ZESTRIL) 20 MG tablet; Take 1 tablet (20 mg total) by mouth every morning. -     amLODipine (NORVASC) 5 MG tablet; Take 1 tablet (5 mg total) by mouth daily.  No orders of the defined types were placed in this encounter.   Follow-up: Return in about 6 weeks (around 12/17/2016) for diabetes.   Boykin Nearing MD

## 2016-11-05 NOTE — Assessment & Plan Note (Addendum)
A: uncontrolled, insulin dependent, improved A1c, labile CBGs Hypoglycemia in setting of inapropriate novolog dosing   P: Endocrinology referral for possible insulin pump Opthalmology referral for yearly diabetic eye exam Continue novolog 1-4 U TID with meals Increase basaglar from 28 to 31 U daily

## 2016-11-05 NOTE — Patient Instructions (Addendum)
[name] was seen today for diabetes.  Diagnoses and all orders for this visit:  DM (diabetes mellitus), type 1 with complications (HCC) -     POCT glucose (manual entry) -     insulin aspart (novoLOG) injection 4 Units; Inject 0.04 mLs (4 Units total) into the skin once. -     POCT glycosylated hemoglobin (Hb A1C) -     Cancel: Ambulatory referral to Endocrinology -     POCT UA - Microalbumin -     Ambulatory referral to Endocrinology -     Insulin Glargine (BASAGLAR KWIKPEN) 100 UNIT/ML SOPN; Inject 0.31 mLs (31 Units total) into the skin daily.  Pure hypercholesterolemia -     simvastatin (ZOCOR) 20 MG tablet; Take 1 tablet (20 mg total) by mouth daily.  Essential hypertension, benign -     lisinopril (PRINIVIL,ZESTRIL) 20 MG tablet; Take 1 tablet (20 mg total) by mouth every morning. -     amLODipine (NORVASC) 5 MG tablet; Take 1 tablet (5 mg total) by mouth daily.   Diabetes blood sugar goals  Fasting (in AM before breakfast, 8 hrs of no eating or drinking (except water or unsweetened coffee or tea): 90-130 2 hrs after meals: < 160,   No low sugars: nothing < 70   Start Norvasc 10 mg daily for BP reduction You will be called about Endocrinology referral  f/u in 6 weeks for diabetes   Dr. Armen Pickup

## 2016-11-16 MED FILL — LISINOPRIL 20 MG TABLET: 20 | 30 days supply | Qty: 30 | Fill #7

## 2016-11-16 MED FILL — DIVALPROEX SOD ER 500 MG TA: 500 | 30 days supply | Qty: 90 | Fill #5

## 2016-11-16 MED FILL — SIMVASTATIN 20 MG TABLET: 20 | 30 days supply | Qty: 30 | Fill #5

## 2016-11-21 ENCOUNTER — Encounter: Payer: Self-pay | Admitting: Family Medicine

## 2016-12-17 ENCOUNTER — Ambulatory Visit: Payer: Self-pay | Admitting: Family Medicine

## 2016-12-24 ENCOUNTER — Ambulatory Visit: Payer: Self-pay | Admitting: Endocrinology

## 2017-01-23 ENCOUNTER — Ambulatory Visit (INDEPENDENT_AMBULATORY_CARE_PROVIDER_SITE_OTHER): Payer: BLUE CROSS/BLUE SHIELD | Admitting: Endocrinology

## 2017-01-23 ENCOUNTER — Encounter: Payer: Self-pay | Admitting: Endocrinology

## 2017-01-23 VITALS — BP 150/84 | HR 71 | Ht 72.0 in | Wt 186.4 lb

## 2017-01-23 DIAGNOSIS — E049 Nontoxic goiter, unspecified: Secondary | ICD-10-CM | POA: Diagnosis not present

## 2017-01-23 DIAGNOSIS — E1065 Type 1 diabetes mellitus with hyperglycemia: Secondary | ICD-10-CM | POA: Diagnosis not present

## 2017-01-23 DIAGNOSIS — R809 Proteinuria, unspecified: Secondary | ICD-10-CM

## 2017-01-23 DIAGNOSIS — E1029 Type 1 diabetes mellitus with other diabetic kidney complication: Secondary | ICD-10-CM | POA: Insufficient documentation

## 2017-01-23 MED ORDER — INSULIN DEGLUDEC 100 UNIT/ML ~~LOC~~ SOPN: 28.0000 [IU] | PEN_INJECTOR | Freq: Every day | SUBCUTANEOUS | 1 refills | Status: DC

## 2017-01-23 MED ORDER — GLUCOSE BLOOD VI STRP: ORAL_STRIP | 2 refills | Status: DC

## 2017-01-23 NOTE — Patient Instructions (Addendum)
Get 30 gm carbs just before workout   Tresiba insulin: This insulin provides blood sugar control for up to 24 hours.    Start with 28 units daily and increase by 2 units every 3-4 days until the waking up sugars are under 140.  Then continue the same dose.    If blood sugar is under 90 for 2 days in a row, reduce the dose by 2 units.  Note that this insulin does not control the rise of blood sugar with meals

## 2017-01-23 NOTE — Progress Notes (Signed)
Patient ID: [name], male   DOB: Apr 04, 1976, 41 y.o.   MRN: [id number]          Reason for Appointment : Consultation for Type 1 Diabetes  History of Present Illness          Diagnosis: Type 1 diabetes mellitus, date of diagnosis: age 79         Previous history:  In the past he has been treated on various insulin regimens including 70/30 insulin, insulin pump and Lantus/NovoLog He says he took the insulin pump therapy for only a short time and this was stopped because of episode of low sugar at night Apparently has had poor control consistently, has had only a couple of readings of A1c below 9% in the last 3 years  Recent history:   INSULIN regimen is: Novolog at meals carbohydrate coverage 1:30; lantus 31 Units in the morning   Current management, blood sugar patterns and problems identified:    He does not take NovoLog at breakfast and lunch usually while at work since he is very active and gets low sugars with even 2 units of Novolog  He will take Lantus in the morning after waking up and has never taken this twice a day, has not had any other basal insulins  He is using 2 different glucose monitors and unable to get a complete record of his readings as he is checking sporadically on the current monitor and not keeping a record  FASTING blood sugars are reportedly mostly high with recent fasting readings generally over 200 and only one good reading of 143  He has tendency to frequent hypoglycemia during the daytime with blood sugars as low as 25; today at 8:30 AM because of exercise if blood sugar went down to 44 even without any mealtime insulin      However not clear what his blood sugar patterns are after supper but they apparently periodically high, highest reading over 500 at 7:30 PM   Glucose monitoring:  is being done 3-6 times a day         Glucometer: True Metrix  Blood Glucose readings from meter download as above: Average for 30 days = 243  Factors causing  hyperglycemia: Physical activity Symptoms of hypoglycemia: sleepy, feeling irritable, weak.  He has had episodes of severe hypoglycemia Treatment of hypoglycemia: lemonade, food,          Self-care:  He says he is able to count carbohydrates and has some books  Mealtimes are: Breakfast at 9 am         Exercise:  walking a lot at work, usually after 9 AM; sometimes going to gym          Dietician consultation: Most recent:years ago .          CDE consultation: Years ago  Diabetes labs:  Lab Results  Component Value Date   HGBA1C 9.9 11/05/2016   HGBA1C 10.4 10/02/2016   HGBA1C 9.8 07/30/2016   Lab Results  Component Value Date   MICROALBUR 150 11/05/2016   LDLCALC 51 12/16/2015   CREATININE 1.21 10/02/2016    Lab Results  Component Value Date   MICRALBCREAT 300 11/05/2016   MICRALBCREAT 1291.0 (H) 11/25/2009     Allergies as of 01/23/2017      Reactions   Ziprasidone Mesylate Other (See Comments)   "severe muscle spasms"      Medication List       Accurate as of 01/23/17  4:54 PM. Always  use your most recent med list.          amLODipine 5 MG tablet Commonly known as:  NORVASC Take 1 tablet (5 mg total) by mouth daily.   aspirin 81 MG tablet Take 81 mg by mouth daily.   divalproex 500 MG 24 hr tablet Commonly known as:  DEPAKOTE ER Take 3 tablets (1,500 mg total) by mouth daily.   FISH OIL PO Take 1 capsule by mouth daily.   ibuprofen 600 MG tablet Commonly known as:  ADVIL,MOTRIN Take 1 tablet (600 mg total) by mouth every 6 (six) hours as needed.   insulin aspart 100 UNIT/ML injection Commonly known as:  novoLOG Inject 1-4 Units into the skin 3 (three) times daily before meals. If sugar > 120   Insulin Pen Needle 31G X 8 MM Misc Commonly known as:  B-D ULTRAFINE III SHORT PEN 1 application by Does not apply route daily.   Insulin Syringe-Needle U-100 31G X 5/16" 0.5 ML Misc Commonly known as:  B-D INS SYRINGE 0.5CC/31GX5/16 1 each by Does  not apply route 3 (three) times daily.   lisinopril 20 MG tablet Commonly known as:  PRINIVIL,ZESTRIL Take 1 tablet (20 mg total) by mouth every morning.   ondansetron 4 MG tablet Commonly known as:  ZOFRAN Take 1 tablet (4 mg total) by mouth every 8 (eight) hours as needed for nausea or vomiting.   simvastatin 20 MG tablet Commonly known as:  ZOCOR Take 1 tablet (20 mg total) by mouth daily.   TRUE METRIX METER w/Device Kit 1 each by Does not apply route as needed.   TRUEPLUS LANCETS 28G Misc 1 each by Does not apply route 3 (three) times daily.       Allergies:  Allergies  Allergen Reactions  . Ziprasidone Mesylate Other (See Comments)    "severe muscle spasms"    Past Medical History:  Diagnosis Date  . Diabetes mellitus   . Hypertension   . Seizures ([place])     Past Surgical History:  Procedure Laterality Date  . HERNIA REPAIR      Family History  Problem Relation Age of Onset  . Hypertension Mother   . Diabetes Paternal Uncle     Social History:  reports that he has never smoked. He has never used smokeless tobacco. He reports that he drinks about 0.6 oz of alcohol per week . He reports that he does not use drugs.      Review of Systems  Constitutional: Negative for weight loss.  Eyes: Negative for blurred vision.  Cardiovascular: Negative for leg swelling.  Gastrointestinal: Negative for abdominal pain.  Endocrine: Negative for fatigue and cold intolerance.  Musculoskeletal: Negative for joint pain.  Neurological: Negative for numbness and tingling.        Lipids: Adequately controlled with simvastatin 20 mg  Lab Results  Component Value Date   CHOL 135 12/16/2015   HDL 78 12/16/2015   LDLCALC 51 12/16/2015   TRIG 32 12/16/2015   CHOLHDL 1.7 12/16/2015    HYPERTENSION: Present for the last few years and treated with lisinopril and Norvasc, lisinopril recently increased followed by PCP   BP Readings from Last 3 Encounters:  01/23/17 (!)  150/84  11/05/16 (!) 146/67  10/02/16 (!) 152/68   Eye exam: 8/17  LABS:  No visits with results within 1 Week(s) from this visit.  Latest known visit with results is:  Office Visit on 11/05/2016  Component Date Value Ref Range Status  . POC  Glucose 11/05/2016 315* 70 - 99 mg/dl Final  . Hemoglobin A1C 11/05/2016 9.9   Final  . Microalbumin Ur, POC 11/05/2016 150  mg/L Final  . Creatinine, POC 11/05/2016 50  mg/dL Final  . Albumin/Creatinine Ratio, Urine, P* 11/05/2016 300   Final  . POC Glucose 11/05/2016 220* 70 - 99 mg/dl Final    Physical Examination:  BP (!) 150/84   Pulse 71   Ht 6' (1.829 m)   Wt 186 lb 6.4 oz (84.6 kg)   SpO2 98%   BMI 25.28 kg/m   GENERAL: Averagely built and nourished HEENT:         Eye exam shows normal external appearance. Fundus exam difficult to achieve, high refractive error Oral exam shows normal mucosa .  NECK:         there is no lymphadenopathy.   Thyroid is  enlarged at least 1-1/2 times normal on the right side, slightly firm and irregular and no nodules felt.  left-sided just palpable  LUNGS:         Chest is symmetrical. Lungs are clear to auscultation.Marland Kitchen   HEART:         Heart sounds:  S1 and S2 are normal. No murmurs or clicks heard., no S3 or S4.   ABDOMEN:  no distention present. Liver and spleen are not palpable. No other mass or tenderness present.   EXTREMITIES:     There is no edema. No skin lesions present.Marland Kitchen  NEUROLOGICAL:        Vibration sense is not reduced in toes. Ankle jerks are normal bilaterally.       Diabetic Foot Exam - Simple   Simple Foot Form Diabetic Foot exam was performed with the following findings:  Yes   Visual Inspection No deformities, no ulcerations, no other skin breakdown bilaterally:  Yes Sensation Testing Intact to touch and monofilament testing bilaterally:  Yes Pulse Check Posterior Tibialis and Dorsalis pulse intact bilaterally:  Yes Comments         MUSCULOSKELETAL:       There is no  enlargement or deformity of the joints.  SKIN:       No rash    ASSESSMENT:  Diabetes type 1,Persistently poorly controlled  See history of present illness for detailed discussion of current diabetes management, blood sugar patterns and problems identified   Currently on a regimen of Lantus once a day and sporadic use of Novolog  Problems identified:   Very labile blood sugars both hypoglycemia and hyperglycemia recently  Hypoglycemia is primarily related to increased activity both at work or otherwise and he is not compensating with increased carbohydrate intake to prevent hypoglycemia  Also likely is getting excessive basal insulin at least on the days he is active during the day with taking Lantus in the morning  Fasting hyperglycemia is likely related to either short duration of action of Lantus, Dawn phenomenon or inadequate coverage of evening meals and also some rebound hyperglycemia  Unable to get a pattern of his blood sugars since he is checking with 2 different meters and not keeping a record, unable to download his Generic monitor  Probably has inadequate coverage of evening meals with Novolog but does not have enough information available  Complications: Diabetic nephropathy with proteinuria, reportedly no retinopathy.  No evidence of neuropathy  GOITER: He may have Hashimoto's thyroiditis and will need regular TSH testing, will order on the next Labs  Diabetic nephropathy and hypertension: Blood pressure is not adequately controlled and  he will follow-up with PCP  Hypercholesterolemia: Adequately controlled   PLAN:   Switch Lantus to TRESIBA 28 units daily, he can continue taking this in the morning once a day  Discussed action profile of Tresiba insulin and need to adjust this based on fasting blood sugar patterns  He was given a flowsheet to adjust the dose every 3-4 days by 2 units to get morning sugars in the 100-140 range  Keep a record of food intake,  pre-and postprandial blood sugars and insulin doses for 3 different days and review with nurse educator  Consider adjusting carbohydrate coverage at suppertime if having consistently high postprandial readings  He will discuss starting at least the Medtronic guardian sensor with nurse educator  Subsequently after adequate education he should be able to move up to the Medtronic 670 pump and given him information on this visit as discussed how this works  He will need to ingest about 30 g of carbohydrate before going for exercise and also extra snacks when he is more active at work  [employer] using contour glucose monitor regularly, 5-6 times a day and bring this for download  He will also need to be educated on using glucagon by nurse educator   Patient Instructions  Get 30 gm carbs just before workout   Tresiba insulin: This insulin provides blood sugar control for up to 24 hours.    Start with 28 units daily and increase by 2 units every 3-4 days until the waking up sugars are under 140.  Then continue the same dose.    If blood sugar is under 90 for 2 days in a row, reduce the dose by 2 units.  Note that this insulin does not control the rise of blood sugar with meals     Counseling time on subjects discussed in assessment and plan sections is over 50% of today's 60 minute visit    Mouhamed Glassco 01/23/2017, 4:54 PM   Note: This note was prepared with Dragon voice recognition system technology. Any transcriptional errors that result from this process are unintentional.

## 2017-01-28 ENCOUNTER — Telehealth: Payer: Self-pay | Admitting: Nutrition

## 2017-01-28 NOTE — Telephone Encounter (Signed)
Message left on my machine to please call him.  No information about what the reason was. Message left on his machine to call me back with my office hours

## 2017-02-01 ENCOUNTER — Telehealth: Payer: Self-pay | Admitting: Endocrinology

## 2017-02-01 NOTE — Telephone Encounter (Signed)
Called Cover My Meds and was told that the PA has not been sent in yet. I will try my best to do what I can.

## 2017-02-01 NOTE — Telephone Encounter (Signed)
PA submitted to Covermymeds 

## 2017-02-01 NOTE — Telephone Encounter (Signed)
Cover my meds calling in reference to getting a prior authorization needed for insulin degludec (TRESIBA FLEXTOUCH) 100 UNIT/ML SOPN FlexTouch Pen    Code-[id number]

## 2017-02-04 ENCOUNTER — Other Ambulatory Visit: Payer: Self-pay

## 2017-02-04 ENCOUNTER — Telehealth: Payer: Self-pay | Admitting: Endocrinology

## 2017-02-04 MED ORDER — BASAGLAR KWIKPEN 100 UNIT/ML ~~LOC~~ SOPN: PEN_INJECTOR | SUBCUTANEOUS | 3 refills | Status: DC

## 2017-02-04 NOTE — Telephone Encounter (Signed)
It looks like PA was submitted by Raynelle FanningJulie on 02/01/17

## 2017-02-04 NOTE — Telephone Encounter (Signed)
Pt states the tresiba needs a PA for it to be covered according to the pharmacy

## 2017-02-05 NOTE — Telephone Encounter (Signed)
PA awaiting Dr. Emelda BrothersKumars signature

## 2017-02-05 NOTE — Telephone Encounter (Signed)
PA has been faxed to [facility] 

## 2017-02-06 ENCOUNTER — Telehealth: Payer: Self-pay | Admitting: Nutrition

## 2017-02-06 ENCOUNTER — Other Ambulatory Visit: Payer: Self-pay

## 2017-02-06 MED ORDER — INSULIN DEGLUDEC 100 UNIT/ML ~~LOC~~ SOPN: 28.0000 [IU] | PEN_INJECTOR | Freq: Every day | SUBCUTANEOUS | 2 refills | Status: DC

## 2017-02-06 NOTE — Telephone Encounter (Signed)
According to fax from [facility] PA is not needed for Tresiba, please call in rx for pt to pick this up. Thank you!

## 2017-02-06 NOTE — Telephone Encounter (Signed)
Message left on machine of times I have available for appts. Next week, as well as the number to call back to schedule that appointment.

## 2017-02-06 NOTE — Telephone Encounter (Signed)
[name] was ordered 01/23/17 but I ordered it again today

## 2017-02-07 ENCOUNTER — Encounter (HOSPITAL_COMMUNITY): Payer: Self-pay | Admitting: *Deleted

## 2017-02-07 ENCOUNTER — Emergency Department (HOSPITAL_COMMUNITY)
Admission: EM | Admit: 2017-02-07 | Discharge: 2017-02-07 | Disposition: A | Discharge status: Home / Self Care | Payer: BLUE CROSS/BLUE SHIELD | Attending: Emergency Medicine | Admitting: Emergency Medicine

## 2017-02-07 DIAGNOSIS — Z7982 Long term (current) use of aspirin: Secondary | ICD-10-CM | POA: Insufficient documentation

## 2017-02-07 DIAGNOSIS — Z794 Long term (current) use of insulin: Secondary | ICD-10-CM | POA: Diagnosis not present

## 2017-02-07 DIAGNOSIS — E162 Hypoglycemia, unspecified: Secondary | ICD-10-CM

## 2017-02-07 DIAGNOSIS — Z79899 Other long term (current) drug therapy: Secondary | ICD-10-CM | POA: Insufficient documentation

## 2017-02-07 DIAGNOSIS — R110 Nausea: Secondary | ICD-10-CM | POA: Diagnosis present

## 2017-02-07 DIAGNOSIS — I10 Essential (primary) hypertension: Secondary | ICD-10-CM | POA: Insufficient documentation

## 2017-02-07 DIAGNOSIS — E11649 Type 2 diabetes mellitus with hypoglycemia without coma: Secondary | ICD-10-CM | POA: Insufficient documentation

## 2017-02-07 LAB — CBC WITH DIFFERENTIAL/PLATELET
BASOS ABS: 0 10*3/uL (ref 0.0–0.1)
BASOS PCT: 0 %
EOS PCT: 4 %
Eosinophils Absolute: 0.2 10*3/uL (ref 0.0–0.7)
HCT: 41.3 % (ref 39.0–52.0)
Hemoglobin: 13.9 g/dL (ref 13.0–17.0)
Lymphocytes Relative: 34 %
Lymphs Abs: 1.5 10*3/uL (ref 0.7–4.0)
MCH: 29.2 pg (ref 26.0–34.0)
MCHC: 33.7 g/dL (ref 30.0–36.0)
MCV: 86.8 fL (ref 78.0–100.0)
MONO ABS: 0.3 10*3/uL (ref 0.1–1.0)
Monocytes Relative: 7 %
Neutro Abs: 2.4 10*3/uL (ref 1.7–7.7)
Neutrophils Relative %: 55 %
Platelets: 223 10*3/uL (ref 150–400)
RBC: 4.76 MIL/uL (ref 4.22–5.81)
RDW: 13.1 % (ref 11.5–15.5)
WBC: 4.4 10*3/uL (ref 4.0–10.5)

## 2017-02-07 LAB — COMPREHENSIVE METABOLIC PANEL
ALK PHOS: 43 U/L (ref 38–126)
ALT: 24 U/L (ref 17–63)
AST: 27 U/L (ref 15–41)
Albumin: 3.7 g/dL (ref 3.5–5.0)
Anion gap: 6 (ref 5–15)
BILIRUBIN TOTAL: 0.9 mg/dL (ref 0.3–1.2)
BUN: 20 mg/dL (ref 6–20)
CALCIUM: 9.3 mg/dL (ref 8.9–10.3)
CO2: 29 mmol/L (ref 22–32)
CREATININE: 1.57 mg/dL — AB (ref 0.61–1.24)
Chloride: 103 mmol/L (ref 101–111)
GFR calc Af Amer: >60 mL/min (ref >60)
GFR, EST NON AFRICAN AMERICAN: 54 mL/min — AB (ref >60)
GLUCOSE: 116 mg/dL — AB (ref 65–99)
Potassium: 4.1 mmol/L (ref 3.5–5.1)
Sodium: 138 mmol/L (ref 135–145)
TOTAL PROTEIN: 6.8 g/dL (ref 6.5–8.1)

## 2017-02-07 LAB — CBG MONITORING, ED
GLUCOSE-CAPILLARY: 57 mg/dL — AB (ref 65–99)
GLUCOSE-CAPILLARY: 169 mg/dL — AB (ref 65–99)

## 2017-02-07 LAB — LIPASE, BLOOD: LIPASE: 22 U/L (ref 11–51)

## 2017-02-07 NOTE — ED Triage Notes (Signed)
States he had a change in his insulin a month ago and has not been able to get the rx  Due to insurance problems

## 2017-02-07 NOTE — ED Notes (Signed)
Pt given OJ with 2 packs of sugar and 2 peanut butter crackers.  Dr Clarene DukeMcManus aware.  Given breakfast tray with bacon, eggs and biscut

## 2017-02-07 NOTE — ED Notes (Signed)
Pt states he feels much better, denies hypo or hyperglycemia sx at this time.

## 2017-02-07 NOTE — Telephone Encounter (Signed)
Patient called to check the status on the note below. Patient has not received an update, I advised the patient that the PA was submitted on 02/01/17. Patient requesting a call from a nurse to discuss further. Call patient to advise.

## 2017-02-07 NOTE — ED Triage Notes (Signed)
C/o weakness.

## 2017-02-07 NOTE — ED Provider Notes (Signed)
[facility]PT Provider Note   CSN: [id number] Arrival date & time: 02/07/17  1009     History   Chief Complaint Chief Complaint  Patient presents with  . Hyperglycemia    HPI [name] is a 41 y.o. male.  HPI  Pt was seen at 1035. Per pt, c/o gradual onset and persistence of several intermittent episodes of "feeling nauseated" and having "blood sugar issues" for the past several weeks. Pt states his Endo MD changed his insulin but he has not been able to fill the rx due to needed PA. Pt has been taking his usual short/long insulin regimen. Pt states his CBG was "347" this morning and he took his lantus at 0640. Pt LD short acting insulin was yesterday at 1800. Pt states he has not eaten this morning. Denies abd pain, no vomiting/diarrhea, no back pain, no SOB/cough, no CP/palpitations, no fevers, no rash.   Past Medical History:  Diagnosis Date  . Diabetes mellitus   . Hypertension   . Seizures [facility])     Patient Active Problem List   Diagnosis Date Noted  . 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 Knee pain 03/16/2016  . Scrotal pain 01/13/2016  . Seizures (HCC) 03/10/2015  . Hyperlipidemia 03/10/2015  . 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 Bipolar disorder (HCC) 11/15/2009  . UNSPECIFIED VITAMIN D DEFICIENCY 06/16/2009  . PTSD 03/26/2008  . Essential hypertension, benign 03/24/2008  . SLEEP DISORDER 03/24/2008  . SLEEP DEPRIVATION 02/09/2008  . DM (diabetes mellitus), type 1 with complications (HCC) 04/04/2007    Past Surgical History:  Procedure Laterality Date  . HERNIA REPAIR         Home Medications    Prior to Admission medications   Medication Sig Start Date End Date Taking? Authorizing Provider  amLODipine (NORVASC) 5 MG tablet Take 1 tablet (5 mg total) by mouth daily. 11/05/16  Yes Funches, Gerilyn Nestle, MD  aspirin 81 MG tablet Take 81 mg by mouth daily.   Yes [provider]  divalproex (DEPAKOTE  ER) 500 MG 24 hr tablet Take 3 tablets (1,500 mg total) by mouth daily. Patient taking differently: Take 1,000 mg by mouth daily.  12/16/15  Yes Funches, Josalyn, MD  glucose blood (CONTOUR NEXT TEST) test strip Use as instructed 5-6x daily 01/23/17  Yes Reather Littler, MD  insulin aspart (NOVOLOG) 100 UNIT/ML injection Inject 1-4 Units into the skin 3 (three) times daily before meals. If sugar > 120 Patient taking differently: Inject 1-3 Units into the skin 3 (three) times daily before meals. If sugar > 120 03/09/16  Yes Funches, Josalyn, MD  insulin degludec (TRESIBA FLEXTOUCH) 100 UNIT/ML SOPN FlexTouch Pen Inject 0.28 mLs (28 Units total) into the skin daily. 02/06/17  Yes Reather Littler, MD  Insulin Glargine (BASAGLAR KWIKPEN) 100 UNIT/ML SOPN Inject 30 units daily Patient taking differently: Inject 31 Units into the skin daily.  02/04/17  Yes Reather Littler, MD  Insulin Pen Needle (B-D ULTRAFINE III SHORT PEN) 31G X 8 MM MISC 1 application by Does not apply route daily. 03/05/16  Yes Funches, Josalyn, MD  Insulin Syringe-Needle U-100 (B-D INS SYRINGE 0.5CC/31GX5/16) 31G X 5/16" 0.5 ML MISC 1 each by Does not apply route 3 (three) times daily. 03/05/16  Yes Funches, Josalyn, MD  lisinopril (PRINIVIL,ZESTRIL) 20 MG tablet Take 1 tablet (20 mg total) by mouth every morning. 11/05/16  Yes Funches, Josalyn, MD  Omega-3 Fatty Acids (FISH OIL PO) Take 1 capsule by mouth  daily.   Yes [provider]  simvastatin (ZOCOR) 20 MG tablet Take 1 tablet (20 mg total) by mouth daily. 11/05/16  Yes Funches, Josalyn, MD  TRUEPLUS LANCETS 28G MISC 1 each by Does not apply route 3 (three) times daily. 03/10/15  Yes Funches, Josalyn, MD  ondansetron (ZOFRAN) 4 MG tablet Take 1 tablet (4 mg total) by mouth every 8 (eight) hours as needed for nausea or vomiting. Patient not taking: Reported on 02/07/2017 10/02/16   Jaclyn ShaggyAmao, Enobong, MD    Family History Family History  Problem Relation Age of Onset  . Hypertension Mother   .  Diabetes Paternal Uncle     Social History Social History  Substance Use Topics  . Smoking status: Never Smoker  . Smokeless tobacco: Never Used  . Alcohol use 0.6 oz/week    1 Glasses of wine per week     Comment: rarely     Allergies   Ziprasidone mesylate   Review of Systems Review of Systems ROS: Statement: All systems negative except as marked or noted in the HPI; Constitutional: Negative for fever and chills. ; ; Eyes: Negative for eye pain, redness and discharge. ; ; ENMT: Negative for ear pain, hoarseness, nasal congestion, sinus pressure and sore throat. ; ; Cardiovascular: Negative for chest pain, palpitations, diaphoresis, dyspnea and peripheral edema. ; ; Respiratory: Negative for cough, wheezing and stridor. ; ; Gastrointestinal: +nausea. Negative for vomiting, diarrhea, abdominal pain, blood in stool, hematemesis, jaundice and rectal bleeding. . ; ; Genitourinary: Negative for dysuria, flank pain and hematuria. ; ; Musculoskeletal: Negative for back pain and neck pain. Negative for swelling and trauma.; ; Skin: Negative for pruritus, rash, abrasions, blisters, bruising and skin lesion.; ; Neuro: Negative for headache, lightheadedness and neck stiffness. Negative for weakness, altered level of consciousness, altered mental status, extremity weakness, paresthesias, involuntary movement, seizure and syncope.       Physical Exam Updated Vital Signs BP (!) 145/84   Pulse (!) 53   Temp 98.3 F (36.8 C) (Oral)   Resp 20   Ht 5\' 11"  (1.803 m)   Wt 84.4 kg (186 lb)   SpO2 100%   BMI 25.94 kg/m   Physical Exam 1040: Physical examination:  Nursing notes reviewed; Vital signs and O2 SAT reviewed;  Constitutional: Well developed, Well nourished, Well hydrated, In no acute distress; Head:  Normocephalic, atraumatic; Eyes: EOMI, PERRL, No scleral icterus; ENMT: Mouth and pharynx normal, Mucous membranes moist; Neck: Supple, Full range of motion, No lymphadenopathy;  Cardiovascular: Regular rate and rhythm, No gallop; Respiratory: Breath sounds clear & equal bilaterally, No wheezes.  Speaking full sentences with ease, Normal respiratory effort/excursion; Chest: Nontender, Movement normal; Abdomen: Soft, Nontender, Nondistended, Normal bowel sounds; Genitourinary: No CVA tenderness; Extremities: Pulses normal, No tenderness, No edema, No calf edema or asymmetry.; Neuro: AA&Ox3, Major CN grossly intact.  Speech clear. No gross focal motor or sensory deficits in extremities. Climbs on and off stretcher easily by himself. Gait steady.; Skin: Color normal, Warm, Dry.   ED Treatments / Results  Labs (all labs ordered are listed, but only abnormal results are displayed)   EKG  EKG Interpretation None       Radiology   Procedures Procedures (including critical care time)  Medications Ordered in ED Medications - No data to display   Initial Impression / Assessment and Plan / ED Course  I have reviewed the triage vital signs and the nursing notes.  Pertinent labs & imaging results that were available  during my care of the patient were reviewed by me and considered in my medical decision making (see chart for details).  MDM Reviewed: nursing note, previous chart and vitals Reviewed previous: labs Interpretation: labs   Results for orders placed or performed during the hospital encounter of 02/07/17  Comprehensive metabolic panel  Result Value Ref Range   Sodium 138 135 - 145 mmol/L   Potassium 4.1 3.5 - 5.1 mmol/L   Chloride 103 101 - 111 mmol/L   CO2 29 22 - 32 mmol/L   Glucose, Bld 116 (H) 65 - 99 mg/dL   BUN 20 6 - 20 mg/dL   Creatinine, Ser 1.611.57 (H) 0.61 - 1.24 mg/dL   Calcium 9.3 8.9 - 09.610.3 mg/dL   Total Protein 6.8 6.5 - 8.1 g/dL   Albumin 3.7 3.5 - 5.0 g/dL   AST 27 15 - 41 U/L   ALT 24 17 - 63 U/L   Alkaline Phosphatase 43 38 - 126 U/L   Total Bilirubin 0.9 0.3 - 1.2 mg/dL   GFR calc non Af Amer 54 (L) >60 mL/min   GFR calc Af  Amer >60 >60 mL/min   Anion gap 6 5 - 15  Lipase, blood  Result Value Ref Range   Lipase 22 11 - 51 U/L  CBC with Differential  Result Value Ref Range   WBC 4.4 4.0 - 10.5 K/uL   RBC 4.76 4.22 - 5.81 MIL/uL   Hemoglobin 13.9 13.0 - 17.0 g/dL   HCT 04.541.3 40.939.0 - 81.152.0 %   MCV 86.8 78.0 - 100.0 fL   MCH 29.2 26.0 - 34.0 pg   MCHC 33.7 30.0 - 36.0 g/dL   RDW 91.413.1 78.211.5 - 95.615.5 %   Platelets 223 150 - 400 K/uL   Neutrophils Relative % 55 %   Neutro Abs 2.4 1.7 - 7.7 K/uL   Lymphocytes Relative 34 %   Lymphs Abs 1.5 0.7 - 4.0 K/uL   Monocytes Relative 7 %   Monocytes Absolute 0.3 0.1 - 1.0 K/uL   Eosinophils Relative 4 %   Eosinophils Absolute 0.2 0.0 - 0.7 K/uL   Basophils Relative 0 %   Basophils Absolute 0.0 0.0 - 0.1 K/uL  POC CBG, ED  Result Value Ref Range   Glucose-Capillary 57 (L) 65 - 99 mg/dL   Comment 1 Notify RN   CBG monitoring, ED  Result Value Ref Range   Glucose-Capillary 169 (H) 65 - 99 mg/dL     [id number]:  Pt has tol PO food/fluids well while in the ED without N/V.  Abd remains benign, VSS. Feels better and wants to go home now. Pt encouraged to increase fluids, f/u Endo MD for further insulin recommendations. Pt agreeable with this plan. Dx and testing d/w pt.  Questions answered.  Verb understanding, agreeable to d/c home with outpt f/u.     Final Clinical Impressions(s) / ED Diagnoses   Final diagnoses:  None    New Prescriptions New Prescriptions   No medications on file     Samuel JesterMcManus, Aerin Delany, DO 02/11/17 1237

## 2017-02-07 NOTE — Discharge Instructions (Signed)
Take your usual prescriptions as previously directed.  Call your regular Endocrinologist today to schedule a follow up appointment within the next 2 days.  Return to the Emergency Department immediately sooner if worsening.

## 2017-02-07 NOTE — Telephone Encounter (Signed)
Routing to you °

## 2017-02-08 ENCOUNTER — Telehealth: Payer: Self-pay | Admitting: Endocrinology

## 2017-02-08 NOTE — Telephone Encounter (Signed)
Patient called to advise that he went to the [facility] last night for his blood sugar. The ER drew blood and his electrolytes are low, he was nauseous and cramping, kidney function "not good." Patient is still nauseous and each time he eats he gets sick. Patient also stated that his fingers and toes are numb however, his blood sugar is 156 this morning. Call patient to advise.

## 2017-02-08 NOTE — Telephone Encounter (Signed)
Routing to you °

## 2017-02-21 ENCOUNTER — Other Ambulatory Visit: Payer: Self-pay | Admitting: Pharmacist

## 2017-02-21 DIAGNOSIS — R569 Unspecified convulsions: Secondary | ICD-10-CM

## 2017-02-21 MED ORDER — DIVALPROEX SODIUM ER 500 MG PO TB24: 1500.0000 mg | ORAL_TABLET | Freq: Every day | ORAL | 0 refills | Status: DC

## 2017-03-05 ENCOUNTER — Other Ambulatory Visit: Payer: Self-pay | Admitting: Pharmacist

## 2017-03-05 DIAGNOSIS — R569 Unspecified convulsions: Secondary | ICD-10-CM

## 2017-03-05 MED ORDER — DIVALPROEX SODIUM ER 500 MG PO TB24: 1500.0000 mg | ORAL_TABLET | Freq: Every day | ORAL | 0 refills | Status: DC

## 2017-03-05 MED FILL — DIVALPROEX SOD ER 500 MG TA: 500 | 30 days supply | Qty: 90 | Fill #0

## 2017-03-06 ENCOUNTER — Ambulatory Visit: Payer: Self-pay | Admitting: Endocrinology

## 2017-05-08 ENCOUNTER — Telehealth: Payer: Self-pay

## 2017-05-08 ENCOUNTER — Ambulatory Visit: Payer: Self-pay | Attending: Nurse Practitioner | Admitting: Nurse Practitioner

## 2017-05-08 ENCOUNTER — Encounter: Payer: Self-pay | Admitting: Nurse Practitioner

## 2017-05-08 VITALS — BP 138/77 | HR 77 | Temp 98.5°F | Ht 72.0 in | Wt 177.0 lb

## 2017-05-08 DIAGNOSIS — Z833 Family history of diabetes mellitus: Secondary | ICD-10-CM | POA: Insufficient documentation

## 2017-05-08 DIAGNOSIS — E109 Type 1 diabetes mellitus without complications: Secondary | ICD-10-CM | POA: Insufficient documentation

## 2017-05-08 DIAGNOSIS — F319 Bipolar disorder, unspecified: Secondary | ICD-10-CM | POA: Insufficient documentation

## 2017-05-08 DIAGNOSIS — Z794 Long term (current) use of insulin: Secondary | ICD-10-CM | POA: Insufficient documentation

## 2017-05-08 DIAGNOSIS — E7800 Pure hypercholesterolemia, unspecified: Secondary | ICD-10-CM

## 2017-05-08 DIAGNOSIS — Z8249 Family history of ischemic heart disease and other diseases of the circulatory system: Secondary | ICD-10-CM | POA: Insufficient documentation

## 2017-05-08 DIAGNOSIS — E1069 Type 1 diabetes mellitus with other specified complication: Secondary | ICD-10-CM

## 2017-05-08 DIAGNOSIS — E108 Type 1 diabetes mellitus with unspecified complications: Secondary | ICD-10-CM

## 2017-05-08 DIAGNOSIS — F3170 Bipolar disorder, currently in remission, most recent episode unspecified: Secondary | ICD-10-CM

## 2017-05-08 DIAGNOSIS — E785 Hyperlipidemia, unspecified: Secondary | ICD-10-CM

## 2017-05-08 DIAGNOSIS — Z7982 Long term (current) use of aspirin: Secondary | ICD-10-CM | POA: Insufficient documentation

## 2017-05-08 DIAGNOSIS — R569 Unspecified convulsions: Secondary | ICD-10-CM

## 2017-05-08 DIAGNOSIS — Z888 Allergy status to other drugs, medicaments and biological substances status: Secondary | ICD-10-CM | POA: Insufficient documentation

## 2017-05-08 DIAGNOSIS — I10 Essential (primary) hypertension: Secondary | ICD-10-CM

## 2017-05-08 DIAGNOSIS — Z79899 Other long term (current) drug therapy: Secondary | ICD-10-CM | POA: Insufficient documentation

## 2017-05-08 LAB — GLUCOSE, POCT (MANUAL RESULT ENTRY)
POC GLUCOSE: 235 mg/dL — AB (ref 70–99)
POC Glucose: 364 mg/dl — AB (ref 70–99)

## 2017-05-08 LAB — POCT GLYCOSYLATED HEMOGLOBIN (HGB A1C): HEMOGLOBIN A1C: 9.2

## 2017-05-08 MED ORDER — AMLODIPINE BESYLATE 5 MG PO TABS: 5.0000 mg | ORAL_TABLET | Freq: Every day | ORAL | 3 refills | Status: DC

## 2017-05-08 MED ORDER — INSULIN ASPART 100 UNIT/ML ~~LOC~~ SOLN
20.0000 [IU] | Freq: Once | SUBCUTANEOUS | Status: AC
  Administered 2017-05-08: 20 [IU] via SUBCUTANEOUS

## 2017-05-08 MED ORDER — SIMVASTATIN 20 MG PO TABS: 20.0000 mg | ORAL_TABLET | Freq: Every day | ORAL | 3 refills | Status: DC

## 2017-05-08 NOTE — Telephone Encounter (Signed)
Error

## 2017-05-08 NOTE — Progress Notes (Signed)
CC: Seen in office today for follow up.   HPI: [name] is a 41 y.o. male here today for follow up.  He has a PMH of DMI, HTN, HPL and Seizures as well as Bipolar Disorder.  He is requesting a referral today to [facility] due to increased stressors in his life.  He currently does not have health insurance and reports he was fired from his job last Thursday. He has not taken any of his medications aside from his diabetes meds for over a week due to stressors from the loss of his job and with his fiance.   Essential Hypertension This is a chronic disease. Stable. We discussed Blood pressure goals today of 130/80 or less. He takes lisinopril 20mg  and amlodipine 5mg  daily.  Cardiovascular risk factors: diabetes mellitus, dyslipidemia, hypertension and male gender. Endorses diet compliance. Blood pressure is almost at goal today.  He currently denies chest pain, shortness of breath, palpitations, lightheadedness, dizziness, headaches or bilateral lower extremity edema.  BP Readings from Last 3 Encounters:  05/08/17 138/77  02/07/17 (!) 162/89  01/23/17 (!) 150/84    Hyperlipidemia He stopped taking atorvastatin as he reports he did not feel like he needed it. [name] was educated on the importance of statins in relation to diabetic complications. He verbalize understanding. Will send refill for atorvastatin. Denies any side effects of atorvastatin in the past including myalgias.  Lab Results  Component Value Date   CHOL 135 12/16/2015   HDL 78 12/16/2015   LDLCALC 51 12/16/2015   TRIG 32 12/16/2015   CHOLHDL 1.7 12/16/2015     DMI Patient has a long standing history of diabetes. He was recently being followed by endocrinology however with the loss of his job he is unable to afford out of pocket. He endorses medication compliance with his Diabetic medications.   He does not have his glucometer with him today. I would like for him to speak with our financial assistance representative so that we can  try to get him back in to see endocrinology.   Lab Results  Component Value Date   HGBA1C 9.2 05/08/2017     Bipolar D/O This is a chronic condition for [name]. He reports being on Geodon in the past however was unable to tolerate due to side effects. He is requesting a referral to [facility]. Feels as if he has become more stressed with increased mood lability.  He is having relationship and financial issues. He is currently not taking any medication for his condition. He denies SI/HI at this time.    Seizure D/O Takes Depakote 1500mg  Daily. However he has not taken any medication for his seizure disorder since last week. We discussed the seriousness of not taking his anti seizure medication. Including death. He assures me he will restart his medication.      Problem  Type 1 diabetes mellitus with hyperlipidemia (HCC)    ALLERGIES: Allergies  Allergen Reactions  . Ziprasidone Mesylate Other (See Comments)    "severe muscle spasms"    PAST MEDICAL HISTORY: Past Medical History:  Diagnosis Date  . Diabetes mellitus   . Hypertension   . Seizures (HCC)     SOCIAL HISTORY Social History   Social History  . Marital status: Single    Spouse name: N/A  . Number of children: N/A  . Years of education: N/A   Occupational History  . Not on file.   Social History Main Topics  . Smoking status: Never Smoker  .  Smokeless tobacco: Never Used  . Alcohol use 0.6 oz/week    1 Glasses of wine per week     Comment: rarely  . Drug use: No  . Sexual activity: Not on file   Other Topics Concern  . Not on file   Social History Narrative  . No narrative on file    FAMILY HISTORY Family History  Problem Relation Age of Onset  . Hypertension Mother   . Diabetes Paternal Uncle      MEDICATIONS AT HOME: Prior to Admission medications   Medication Sig Start Date End Date Taking? Authorizing Provider  amLODipine (NORVASC) 5 MG tablet Take 1 tablet (5 mg total) by mouth daily. 11/05/16    Funches, Gerilyn NestleJosalyn, MD  aspirin 81 MG tablet Take 81 mg by mouth daily.    [provider]  divalproex (DEPAKOTE ER) 500 MG 24 hr tablet Take 3 tablets (1,500 mg total) by mouth daily. 03/05/17   Quentin AngstJegede, Olugbemiga E, MD  glucose blood (CONTOUR NEXT TEST) test strip Use as instructed 5-6x daily 01/23/17   Reather LittlerKumar, Ajay, MD  insulin aspart (NOVOLOG) 100 UNIT/ML injection Inject 1-4 Units into the skin 3 (three) times daily before meals. If sugar > 120 Patient taking differently: Inject 1-3 Units into the skin 3 (three) times daily before meals. If sugar > 120 03/09/16   Funches, Josalyn, MD  insulin degludec (TRESIBA FLEXTOUCH) 100 UNIT/ML SOPN FlexTouch Pen Inject 0.28 mLs (28 Units total) into the skin daily. 02/06/17   Reather LittlerKumar, Ajay, MD  Insulin Glargine (BASAGLAR KWIKPEN) 100 UNIT/ML SOPN Inject 30 units daily Patient taking differently: Inject 31 Units into the skin daily.  02/04/17   Reather LittlerKumar, Ajay, MD  Insulin Pen Needle (B-D ULTRAFINE III SHORT PEN) 31G X 8 MM MISC 1 application by Does not apply route daily. 03/05/16   Funches, Gerilyn NestleJosalyn, MD  Insulin Syringe-Needle U-100 (B-D INS SYRINGE 0.5CC/31GX5/16) 31G X 5/16" 0.5 ML MISC 1 each by Does not apply route 3 (three) times daily. 03/05/16   Funches, Gerilyn NestleJosalyn, MD  lisinopril (PRINIVIL,ZESTRIL) 20 MG tablet Take 1 tablet (20 mg total) by mouth every morning. 11/05/16   Funches, Gerilyn NestleJosalyn, MD  Omega-3 Fatty Acids (FISH OIL PO) Take 1 capsule by mouth daily.    [provider]  ondansetron (ZOFRAN) 4 MG tablet Take 1 tablet (4 mg total) by mouth every 8 (eight) hours as needed for nausea or vomiting. Patient not taking: Reported on 02/07/2017 10/02/16   Jaclyn ShaggyAmao, Enobong, MD  simvastatin (ZOCOR) 20 MG tablet Take 1 tablet (20 mg total) by mouth daily. 11/05/16   Funches, Gerilyn NestleJosalyn, MD  TRUEPLUS LANCETS 28G MISC 1 each by Does not apply route 3 (three) times daily. 03/10/15   Dessa PhiFunches, Josalyn, MD   Review of Systems  Constitutional: Negative for chills, fever,  malaise/fatigue and weight loss.  HENT: Negative.   Respiratory: Negative for cough and shortness of breath.   Cardiovascular: Negative.  Negative for chest pain, leg swelling and PND.  Gastrointestinal: Negative for abdominal pain, constipation, diarrhea, nausea and vomiting.  Genitourinary: Negative.  Negative for dysuria, frequency and urgency.  Musculoskeletal: Negative for back pain.  Skin: Negative.   Neurological: Negative for dizziness, tingling, tremors, sensory change, speech change, focal weakness and headaches.  Psychiatric/Behavioral: Positive for depression. Negative for hallucinations, memory loss, substance abuse and suicidal ideas. The patient has insomnia. The patient is not nervous/anxious.     Objective:   Vitals:   05/08/17 1341  BP: 138/77  Pulse: 77  Temp: 98.5 F (36.9 C)  SpO2: 99%    Physical Exam  Constitutional: He is oriented to person, place, and time and well-developed, well-nourished, and in no distress.  HENT:  Head: Normocephalic.  Eyes: EOM are normal.  Neck: Normal range of motion. No thyromegaly present.  Cardiovascular: Normal rate, regular rhythm and normal heart sounds.   Pulmonary/Chest: Effort normal and breath sounds normal. No respiratory distress. He has no wheezes. He has no rales. He exhibits no tenderness.  Abdominal: Soft. Bowel sounds are normal. He exhibits no distension and no mass. There is no tenderness. There is no rebound and no guarding.  Musculoskeletal: Normal range of motion.  Neurological: He is alert and oriented to person, place, and time.  Skin: Skin is warm and dry.  Psychiatric: Mood, memory, affect and judgment normal.     Lab Results  Component Value Date   WBC 4.4 02/07/2017   HGB 13.9 02/07/2017   HCT 41.3 02/07/2017   MCV 86.8 02/07/2017   PLT 223 02/07/2017   Lab Results  Component Value Date   CREATININE 1.57 (H) 02/07/2017   BUN 20 02/07/2017   NA 138 02/07/2017   K 4.1 02/07/2017   CL 103  02/07/2017   CO2 29 02/07/2017   Lab Results  Component Value Date   HGBA1C 9.2 05/08/2017     Lipid Panel     Component Value Date/Time   CHOL 135 12/16/2015 1521   TRIG 32 12/16/2015 1521   HDL 78 12/16/2015 1521   CHOLHDL 1.7 12/16/2015 1521   VLDL 6 12/16/2015 1521   LDLCALC 51 12/16/2015 1521        Assessment and plan:    [name] was seen today for diabetes.  Diagnoses and all orders for this visit:  DM (diabetes mellitus), type 1 with complications (HCC) -     POCT glucose (manual entry) -     POCT glycosylated hemoglobin (Hb A1C) -     insulin aspart (novoLOG) injection 20 Units; Inject 0.2 mLs (20 Units total) into the skin once. -     POCT glucose (manual entry)  Essential hypertension, benign -     amLODipine (NORVASC) 5 MG tablet; Take 1 tablet (5 mg total) by mouth daily. Continue to take medications as prescribed.  Remember to bring in a blood pressure log and to each office visit for review.  D A S H diet.  Exercise at least 150 minutes/week.  Type 1 diabetes mellitus with hyperlipidemia (HCC) Pure Hypercholesterolemia -     simvastatin (ZOCOR) 20 MG tablet; Take 1 tablet (20 mg total) by mouth daily.  Bipolar disorder in partial remission, most recent episode unspecified type [facility]) -     Ambulatory referral to Psychiatry  Seizures (HCC) Continue Depakote as prescribed.  No recent seizure activity.   Patient has been counseled extensively about nutrition and exercise. Other issues discussed during this visit include: low cholesterol diet, weight control with exercise at least 150 minutes per week, foot care, annual eye examinations at Ophthalmology, importance of adherence with medications and regular follow-up.    Return: Have patient follow up with Diane for financial assistance. Follow up with me in 3 months. WIll likely need Endocrinology.   The patient was given clear instructions to go to ER or return to medical center if symptoms don't improve,  worsen or new problems develop. The patient verbalized understanding.      Claiborne Rigg, FNP-BC, [facility] and [facility] [place],  [place] [phone]

## 2017-05-08 NOTE — Patient Instructions (Addendum)
Behavioral Health Resources:   What if I or someone I know is in crisis?  . If you are thinking about harming yourself or having thoughts of suicide, or if you know someone who is, seek help right away.  . Call your doctor or mental health care provider.  . Call 911 or go to a hospital emergency room to get immediate help, or ask a friend or family member to help you do these things.  . Call the [place] National Suicide Prevention Lifeline's toll-free, 24-hour hotline at 1-800-273-TALK 404-534-1600) or TTY: 1-800-799-4 TTY 765-845-6712) to talk to a trained counselor.  . If you are in crisis, make sure you are not left alone.   . If someone else is in crisis, make sure he or she is not left alone   24 Hour Availability  [facility]  [address] [place], [place] [address]  [phone] or [phone]  [facility] [employer] (Domestic Violence, Rape & Victim Assistance [phone]  [facility]  [address], [place]  [address]               [phone] or [phone]  [place]    (ONLY from 8am-4pm)    [phone]  [facility] (24/7)   [phone]  [place] National Suicide Hotline   708-348-7953 Diamantina Monks)  Support from local police to aid getting patient to hospital (http://www.[place].gov/index.aspx?page=2797)         ONGOING BEHAVIORAL HEALTH SUPPORT FOR UNINSURED and UNDERINSURED:  [facility]ly Sessions  [phone]   [place] first time, Monday-Friday, 8:30am-5:00pm  *Bring snack, drink, something to do, long wait at first visit, they do have pharmacy for behavioral health medications/ Bring own interpreter at first visit, if needed  [facility] [place]  [place] Monday-Friday, 8:30am-12pm & 1-2:30pm  *pacientes que hablen espanol, favor comunicarse con el Sr.  [place], extension 2244 o [place], extension [place]:  [phone] or [email or web address]  [address]  Call or email, may self-refer  * uninsured/underinsured, [phone], have both mental health and substance use challenges   [facility]:  Phone [phone]; Fax [phone]  *Call to schedule an appointment  3rd Floor located @?[address], corner of [address] and [address]  Mon-Thursday: 8:30am-8:00pm Friday: 8:30am-7:00pm  * Be sure to park in a space labeled "Psychology Department," located to the right of the main door of the building. Enter the main doors facing the parking lot and take the elevator or stairs to the 3rd Floor.   [place]:  [phone] or  [phone] (24/hour helpline)  [address]  Call to make appointment, tends to be a long wait to begin services, depending on insurance    [facility]  [phone] ??  *Call to schedule an appointment   [address], [place], 8:00am-5:00pm            [place]  [phone] [address], [place]  Monday-Friday, walk-in 8am-3pm  First appointment is assessment, then will make appointment for psychiatry     Bipolar 1 Disorder Bipolar 1 disorder is a mental health disorder in which a person has episodes of emotional highs (mania), and may also have episodes of emotional lows (  depression) in addition to highs. Bipolar 1 disorder is different from other bipolar disorders because it involves extreme manic episodes. These episodes last at least one week or involve symptoms that are so severe that hospitalization is needed to keep the person safe. What increases the risk? The cause of this condition is not known. However, certain factors make you more likely to have bipolar disorder, such as:  Having a family member with the disorder.  An imbalance of certain  chemicals in the brain (neurotransmitters).  Stress, such as illness, financial problems, or a death.  Certain conditions that affect the brain or spinal cord (neurologic conditions).  Brain injury (trauma).  Having another mental health disorder, such as: ? Obsessive compulsive disorder. ? Schizophrenia.  What are the signs or symptoms? Symptoms of mania include:  Very high self-esteem or self-confidence.  Decreased need for sleep.  Unusual talkativeness or feeling a need to keep talking. Speech may be very fast. It may seem like you cannot stop talking.  Racing thoughts or constant talking, with quick shifts between topics that may or may not be related (flight of ideas).  Decreased ability to focus or concentrate.  Increased purposeful activity, such as work, studies, or social activity.  Increased nonproductive activity. This could be pacing, squirming and fidgeting, or finger and toe tapping.  Impulsive behavior and poor judgment. This may result in high-risk activities, such as having unprotected sex or spending a lot of money.  Symptoms of depression include:  Feeling sad, hopeless, or helpless.  Frequent or uncontrollable crying.  Lack of feeling or caring about anything.  Sleeping too much.  Moving more slowly than usual.  Not being able to enjoy things you used to enjoy.  Wanting to be alone all the time.  Feeling guilty or worthless.  Lack of energy or motivation.  Trouble concentrating or remembering.  Trouble making decisions.  Increased appetite.  Thoughts of death, or the desire to harm yourself.  Sometimes, you may have a mixed mood. This means having symptoms of depression and mania. Stress can make symptoms worse. How is this diagnosed? To diagnose bipolar disorder, your health care provider may ask about your:  Emotional episodes.  Medical history.  Alcohol and drug use. This includes prescription medicines. Certain medical  conditions and substances can cause symptoms that seem like bipolar disorder (secondary bipolar disorder).  How is this treated? Bipolar disorder is a long-term (chronic) illness. It is best controlled with ongoing (continuous) treatment rather than treatment only when symptoms occur. Treatment may include:  Medicine. Medicine can be prescribed by a provider who specializes in treating mental disorders (psychiatrist). ? Medicines called mood stabilizers are usually prescribed. ? If symptoms occur even while taking a mood stabilizer, other medicines may be added.  Psychotherapy. Some forms of talk therapy, such as cognitive-behavioral therapy (CBT), can provide support, education, and guidance.  Coping methods, such as journaling or relaxation exercises. These may include: ? Yoga. ? Meditation. ? Deep breathing.  Lifestyle changes, such as: ? Limiting alcohol and drug use. ? Exercising regularly. ? Getting plenty of sleep. ? Making healthy eating choices.  A combination of medicine, talk therapy, and coping methods is best. A procedure in which electricity is applied to the brain through the scalp (electroconvulsive therapy) may be used in cases of severe mania when medicine and psychotherapy work too slowly or do not work. Follow these instructions at home: Activity   Return to your normal activities as told by your health care  provider.  Find activities that you enjoy, and make time to do them.  Exercise regularly as told by your health care provider. Lifestyle  Limit alcohol intake to no more than 1 drink a day for nonpregnant women and 2 drinks a day for men. One drink equals 12 oz of beer, 5 oz of wine, or 1 oz of hard liquor.  Follow a set schedule for eating and sleeping.  Eat a balanced diet that includes fresh fruits and vegetables, whole grains, low-fat dairy, and lean meat.  Get 7-8 hours of sleep each night. General instructions  Take over-the-counter and  prescription medicines only as told by your health care provider.  Think about joining a support group. Your health care provider may be able to recommend a support group.  Talk with your family and loved ones about your treatment goals and how they can help.  Keep all follow-up visits as told by your health care provider. This is important. Where to find more information: For more information about bipolar disorder, visit the following websites:  [employer] on Mental Illness: www.nami.[place]: https://carter.com/  Contact a health care provider if:  Your symptoms get worse.  You have side effects from your medicine, and they get worse.  You have trouble sleeping.  You have trouble doing daily activities.  You feel unsafe in your surroundings.  You are dealing with substance abuse. Get help right away if:  You have new symptoms.  You have thoughts about harming yourself.  You self-harm. This information is not intended to replace advice given to you by your health care provider. Make sure you discuss any questions you have with your health care provider. Document Released: 10/01/2000 Document Revised: 02/19/2016 Document Reviewed: 02/23/2016 Elsevier Interactive Patient Education  2018 [employer].  Bipolar 2 Disorder Bipolar 2 disorder is a mental health disorder in which a person has episodes of emotional highs (mania) and lows (depression). Bipolar 2 is different from other bipolar disorders because the manic episodes are not as high and do not last as long. This is called hypomania. People with bipolar 2 disorder usually go back and forth between hypomanic and depressive episodes. What are the causes? The cause of this condition is not known. What increases the risk? The following factors may make you more likely to develop this condition:  Having a family member with the disorder.  An imbalance of certain chemicals in the  brain (neurotransmitters).  Stress, such as a death, illness, or financial problems.  Certain conditions that affect the brain or spinal cord (neurologic conditions).  Brain injury (trauma).  Having another mental health disorder, such as: ? Obsessive compulsive disorder. ? Schizophrenia.  What are the signs or symptoms? Symptoms of hypomania include:  Very high self-esteem or self-confidence.  Decreased need for sleep.  Unusual talkativeness or feeling a need to keep talking. Speech may be very fast. It may seem like you cannot stop talking.  Racing thoughts or constant talking, with quick shifts between topics that may or may not be related (flight of ideas).  Decreased ability to focus or concentrate.  Increased purposeful activity, such as work, studies, or social activity.  Increased nonproductive activity. This could be pacing, squirming and fidgeting, or finger and toe tapping.  Impulsive behavior and poor judgment. This may result in high-risk activities, such as having unprotected sex or spending a lot of money.  Symptoms of depression include:  Feeling sad, hopeless, or helpless.  Frequent or uncontrollable crying.  Lack of feeling or caring about anything.  Sleeping too much.  Moving more slowly than usual.  Not being able to enjoy things you used to enjoy.  Desire to be alone all the time.  Feeling guilty or worthless.  Lack of energy or motivation.  Trouble concentrating or remembering.  Trouble making decisions.  Increased appetite.  Thoughts of death or desire to harm yourself.  How is this diagnosed? To diagnose bipolar 2 disorder, your health care provider may ask about your:  Emotional episodes.  Medical history.  Alcohol and drug use. This includes prescription medicines. Certain medical conditions and substances can cause symptoms that seem like bipolar disorder (secondary bipolar disorder).  How is this treated? Bipolar 2  disorder is a long-term (chronic) illness. It is best controlled with ongoing (continuous) treatment rather than being treated only when symptoms occur. Treatment may include:  Psychotherapy. Some forms of talk therapy, such as cognitive-behavioral therapy (CBT), can provide support, education, and guidance.  Coping strategies, such as journaling or relaxation exercises. Relaxation exercises include: ? Yoga. ? Meditation. ? Deep breathing.  Lifestyle changes, such as: ? Limiting alcohol and drug use. ? Exercising regularly. ? Getting plenty of sleep. ? Making healthy eating choices.  Medicine. Medicine can be prescribed by a health care provider who specializes in treating mental disorders (psychiatrist). ? Medicines called mood stabilizers are usually prescribed. ? If symptoms occur even while taking a mood stabilizer, other medicines may be added.  A combination of medicine, talk therapy, and coping methods is the best way to treat this condition. Follow these instructions at home: Activity  Return to your normal activities as told by your health care provider.  Find activities that you enjoy, and make time to do them.  Exercise regularly as told by your health care provider. Lifestyle  Limit alcohol intake to no more than 1 drink a day for nonpregnant women and 2 drinks a day for men. One drink equals 12 oz of beer, 5 oz of wine, or 1 oz of hard liquor.  Follow a set schedule for eating and sleeping.  Eat a balanced diet that includes fresh fruits and vegetables, whole grains, low-fat dairy, and lean meats.  Get at least 7-8 hours of sleep each night. General instructions  Take over-the-counter and prescription medicines only as told by your health care provider.  Think about joining a support group. Your health care provider may be able to recommend a support group.  Talk with your family and loved ones about your treatment goals and how they can help.  Keep all  follow-up visits as told by your health care provider. This is important. Where to find more information: For more information about bipolar 2 disorder, visit the following websites:  [employer] on Mental Illness: www.nami.[place]: https://carter.com/  Contact a health care provider if:  Your symptoms get worse.  You have side effects from your medicine, and they get worse.  You have trouble sleeping.  You have trouble doing daily activities.  You feel unsafe in your surroundings.  You are dealing with substance abuse. Get help right away if:  You have new symptoms.  You have thoughts about harming yourself or others.  You harm yourself. Summary  Bipolar 2 disorder is a mental health disorder in which a person has episodes of hypomania and depression.  Bipolar 2 is best treated through a combination of medicines, talk therapy, and coping  strategies.  Talk with your family and loved ones about your treatment goals and how they can help. This information is not intended to replace advice given to you by your health care provider. Make sure you discuss any questions you have with your health care provider. Document Released: 07/31/2016 Document Revised: 07/31/2016 Document Reviewed: 07/31/2016 Elsevier Interactive Patient Education  2018 [employer].  How to Avoid Diabetes Mellitus Problems You can take action to prevent or slow down problems that are caused by diabetes (diabetes mellitus). Following your diabetes plan and taking care of yourself can reduce your risk of serious or life-threatening complications. Manage your diabetes  Follow instructions from your health care providers about managing your diabetes. Your diabetes may be managed by a team of health care providers who can teach you how to care for yourself and can answer questions that you have.  Educate yourself about your condition so you can make healthy choices  about eating and physical activity.  Check your blood sugar (glucose) levels as often as directed. Your health care provider will help you decide how often to check your blood glucose level depending on your treatment goals and how well you are meeting them.  Ask your health care provider if you should take low-dose aspirin daily and what dose is recommended for you. Taking low-dose aspirin daily is recommended to help prevent cardiovascular disease. Do not use nicotine or tobacco Do not use any products that contain nicotine or tobacco, such as cigarettes and e-cigarettes. If you need help quitting, ask your health care provider. Nicotine raises your risk for diabetes problems. If you quit using nicotine:  You will lower your risk for heart attack, stroke, nerve disease, and kidney disease.  Your cholesterol and blood pressure may improve.  Your blood circulation will improve.  Keep your blood pressure under control To control your blood pressure:  Follow instructions from your health care provider about meal planning, exercise, and medicines.  Make sure your health care provider checks your blood pressure at every medical visit.  A blood pressure reading consists of two numbers. Generally, the goal is to keep your top number (systolic pressure) at or below 130, and your bottom number (diastolic pressure) at or below 80. Your health care provider may recommend a lower target blood pressure. Your individualized target blood pressure is determined based on:  Your age.  Your medicines.  How long you have had diabetes.  Any other medical conditions you have.  Keep your cholesterol under control To control your cholesterol:  Follow instructions from your health care provider about meal planning, exercise, and medicines.  Have your cholesterol checked at least once a year.  You may be prescribed medicine to lower cholesterol (statin). If you are not taking a statin, ask your health  care provider if you should be.  Controlling your cholesterol may:  Help prevent heart disease and stroke. These are the most common health problems for people with diabetes.  Improve your blood flow.  Schedule and keep yearly physical exams and eye exams Your health care provider will tell you how often you need medical visits depending on your diabetes management plan. Keep all follow-up visits as directed. This is important so possible problems can be identified early and complications can be avoided or treated.  Every visit with your health care provider should include measuring your: ? Weight. ? Blood pressure. ? Blood glucose control.  Your A1c (hemoglobin A1c) level should be checked: ? At least 2 times  a year, if you are meeting your treatment goals. ? 4 times a year, if you are not meeting treatment goals or if your treatment goals have changed.  Your blood lipids (lipid profile) should be checked yearly. You should also be checked yearly for protein in your urine (urine microalbumin).  If you have type 1 diabetes, get an eye exam 3-5 years after you are diagnosed, and then once a year after your first exam.  If you have type 2 diabetes, get an eye exam as soon as you are diagnosed, and then once a year after your first exam.  Keep your vaccines current It is recommended that you receive:  pneumonia (pneumococcal) vaccine and a hepatitis B vaccine. If you are age 17 or older, you may get the pneumonia vaccine as a series of two separate shots.  Ask your health care provider which other vaccines may be recommended. Take care of your feet Diabetes may cause you to have poor blood circulation to your legs and feet. Because of this, taking care of your feet is very important. Diabetes can cause:  The skin on the feet to get thinner, break more easily, and heal more slowly.  Nerve damage in your legs and feet, which results in decreased feeling. You may not notice minor  injuries that could lead to serious problems.  To avoid foot problems:  Check your skin and feet every day for cuts, bruises, redness, blisters, or sores.  Schedule a foot exam with your health care provider once every year. This exam includes: ? Inspecting of the structure and skin of your feet. ? Checking the pulses and sensation in your feet.  Make sure that your health care provider performs a visual foot exam at every medical visit.  Take care of your teeth People with poorly controlled diabetes are more likely to have gum (periodontal) disease. Diabetes can make periodontal diseases harder to control. If not treated, periodontal diseases can lead to tooth loss. To prevent this:  Brush your teeth twice a day.  Floss at least once a day.  Visit your dentist 2 times a year.  Drink responsibly Limit alcohol intake to no more than 1 drink a day for nonpregnant women and 2 drinks a day for men. One drink equals 12 oz of beer, 5 oz of wine, or 1 oz of hard liquor. It is important to eat food when you drink alcohol to avoid low blood glucose (hypoglycemia). Avoid alcohol if you:  Have a history of alcohol abuse or dependence.  Are pregnant.  Have liver disease, pancreatitis, advanced neuropathy, or severe hypertriglyceridemia.  Lessen stress Living with diabetes can be stressful. When you are experiencing stress, your blood glucose may be affected in two ways:  Stress hormones may cause your blood glucose to rise.  You may be distracted from taking good care of yourself.  Be aware of your stress level and make changes to help you manage challenging situations. To lower your stress levels:  Consider joining a support group.  Do planned relaxation or meditation.  Do a hobby that you enjoy.  Maintain healthy relationships.  Exercise regularly.  Work with your health care provider or a mental health professional.  Summary  You can take action to prevent or slow down  problems that are caused by diabetes (diabetes mellitus). Following your diabetes plan and taking care of yourself can reduce your risk of serious or life-threatening complications.  Follow instructions from your health care providers about managing your  diabetes. Your diabetes may be managed by a team of health care providers who can teach you how to care for yourself and can answer questions that you have.  Your health care provider will tell you how often you need medical visits depending on your diabetes management plan. Keep all follow-up visits as directed. This is important so possible problems can be identified early and complications can be avoided or treated. This information is not intended to replace advice given to you by your health care provider. Make sure you discuss any questions you have with your health care provider. Document Released: 03/13/2011 Document Revised: 03/24/2016 Document Reviewed: 03/24/2016 Elsevier Interactive Patient Education  2018 [employer].  Type 1 Diabetes Mellitus, Self Care, Adult When you have type 1 diabetes (type 1 diabetes mellitus), you must keep your blood sugar (glucose) under control. You can do this with:  Insulin.  Nutrition.  Exercise.  Lifestyle changes.  Other medicines, if needed.  Support from your doctors and others.  How do I manage my blood sugar?  Check your blood sugar every day, as often as told.  Call your doctor if your blood sugar is above your goal numbers for 2 tests in a row.  Have your A1c (hemoglobin A1c) level checked at least twice a year. Have it checked more often if your doctor tells you to. Your doctor will set treatment goals for you. Generally, you should have these blood sugar levels:  Before meals (preprandial): 80-130 mg/dL (4.4-7.2 mmol/L).  After meals (postprandial): below 180 mg/dL (10 mmol/L).  A1c level: less than 7%.  What do I need to know about high blood sugar? High blood sugar is  called hyperglycemia. Know the signs of high blood sugar. Signs may include:  Feeling: ? Thirsty. ? Hungry. ? Very tired.  Needing to pee (urinate) more than usual.  Blurry vision.  What do I need to know about low blood sugar? Low blood sugar is called hypoglycemia. This is when blood sugar is at or below 70 mg/dL (3.9 mmol/L). Symptoms may include:  Feeling: ? Hungry. ? Worried or nervous (anxious). ? Sweaty and clammy. ? Confused. ? Dizzy. ? Sleepy. ? Sick to your stomach (nauseous).  Having: ? A fast heartbeat. ? A headache. ? A change in your vision. ? Jerky movements that you cannot control (seizure). ? Nightmares. ? Tingling or no feeling (numbness) around the mouth, lips, or tongue.  Having trouble with: ? Talking. ? Paying attention (concentrating). ? Moving (coordination). ? Sleeping.  Shaking.  Passing out (fainting).  Getting upset easily (irritability).  Treating low blood sugar  To treat low blood sugar, eat or drink something sugary right away. If you can think clearly and swallow safely, follow the 15:15 rule:  Take 15 grams of a fast-acting carb (carbohydrate). Some fast-acting carbs are: ? 1 tube of glucose gel. ? 3 sugar tablets (glucose pills). ? 6-8 pieces of hard candy. ? 4 oz (120 mL) of fruit juice. ? 4 oz (120 mL) regular (not diet) soda.  Check your blood sugar 15 minutes after you take the carb.  If your blood sugar is still at or below 70 mg/dL (3.9 mmol/L), take 15 grams of a carb again.  If your blood sugar does not go above 70 mg/dL (3.9 mmol/L) after 3 tries, get help right away.  After your blood sugar goes back to normal, eat a meal or a snack within 1 hour.  Treating very low blood sugar If your blood sugar  is at or below 54 mg/dL (3 mmol/L), you have very low blood sugar (severe hypoglycemia). This is an emergency. Do not wait to see if the symptoms will go away. Get medical help right away. Call your local emergency  services (911 in the [place] Do not drive yourself to the hospital. If you have very low blood sugar and you cannot eat or drink, you may need a glucagon shot (injection). A family member or friend should learn how to check your blood sugar and how to give you a glucagon shot. Ask your doctor if you need to have a glucagon shot kit at home. What else is important to manage my diabetes? Medicine  Take insulin and diabetes medicines as told.  Adjust your insulin and medicines as told.  Do not run out of insulin or medicines. Having diabetes can put you at risk for other long-term (chronic) conditions. These may include heart disease and kidney disease. Your doctor may prescribe medicines to help prevent problems from diabetes. Food  Make healthy food choices. These include: ? Chicken, fish, egg whites, and beans. ? Oats, whole wheat, bulgur, brown rice, quinoa, and millet. ? Fresh fruits and vegetables. ? Low-fat dairy products. ? Nuts, avocado, olive oil, and canola oil.  Meet with a food specialist (registered dietitian). He or she can help you make an eating plan that is right for you.  Follow instructions from your doctor about what you cannot eat or drink.  Drink enough fluid to keep your pee (urine) clear or pale yellow.  Eat healthy snacks between healthy meals.  Keep track of carbs that you eat. Do this by reading food labels and learning food serving sizes.  Follow your sick day plan when you cannot eat or drink normally. Make this plan with your doctor so it is ready to use. Activity   Exercise at least 3 times a week.  Do not go more than 2 days without exercising.  Talk with your doctor before you start a new exercise. Your doctor may need to adjust your insulin, medicines, or food. Lifestyle   Do not use any tobacco products. These include cigarettes, chewing tobacco, and e-cigarettes. If you need help quitting, ask your doctor.  Ask your doctor how much alcohol  is safe for you.  Learn to deal with stress. If you need help with this, ask your doctor. Body care  Stay up to date with your shots (immunizations).  Have your eyes and feet checked by a doctor as often as told.  Check your skin and feet every day. Check for cuts, bruises, redness, blisters, or sores.  Brush your teeth and gums two times a day, and floss at least one time a day.  Go to the dentist least one time every 6 months.  Stay at a healthy weight. General instructions   Take over-the-counter and prescription medicines only as told by your doctor.  Share your diabetes care plan with: ? Your work or school. ? People you live with.  Check your pee (urine) for ketones: ? When you are sick. ? As told by your doctor.  Carry a card or wear jewelry that says that you have diabetes.  Ask your doctor: ? Do I need to meet with a diabetes educator? ? Where can I find a support group for people with diabetes?  Keep all follow-up visits as told by your doctor. This is important. Where to find more information: To learn more about diabetes, visit:  American Diabetes  Association: www.diabetes.org  American Association of Diabetes Educators: www.diabeteseducator.org/patient-resources  This information is not intended to replace advice given to you by your health care provider. Make sure you discuss any questions you have with your health care provider. Document Released: 10/17/2015 Document Revised: 12/01/2015 Document Reviewed: 07/29/2015 Elsevier Interactive Patient Education  [employer].

## 2017-05-10 ENCOUNTER — Emergency Department (HOSPITAL_COMMUNITY)
Admission: EM | Admit: 2017-05-10 | Discharge: 2017-05-11 | Disposition: A | Discharge status: Home / Self Care | Payer: Self-pay | Attending: Emergency Medicine | Admitting: Emergency Medicine

## 2017-05-10 ENCOUNTER — Encounter (HOSPITAL_COMMUNITY): Payer: Self-pay | Admitting: Emergency Medicine

## 2017-05-10 DIAGNOSIS — R45851 Suicidal ideations: Secondary | ICD-10-CM | POA: Insufficient documentation

## 2017-05-10 DIAGNOSIS — R4689 Other symptoms and signs involving appearance and behavior: Secondary | ICD-10-CM | POA: Insufficient documentation

## 2017-05-10 DIAGNOSIS — Z794 Long term (current) use of insulin: Secondary | ICD-10-CM | POA: Insufficient documentation

## 2017-05-10 DIAGNOSIS — Z7982 Long term (current) use of aspirin: Secondary | ICD-10-CM | POA: Insufficient documentation

## 2017-05-10 DIAGNOSIS — R45850 Homicidal ideations: Secondary | ICD-10-CM | POA: Insufficient documentation

## 2017-05-10 DIAGNOSIS — F4310 Post-traumatic stress disorder, unspecified: Secondary | ICD-10-CM | POA: Diagnosis present

## 2017-05-10 DIAGNOSIS — E109 Type 1 diabetes mellitus without complications: Secondary | ICD-10-CM | POA: Insufficient documentation

## 2017-05-10 DIAGNOSIS — I10 Essential (primary) hypertension: Secondary | ICD-10-CM | POA: Insufficient documentation

## 2017-05-10 LAB — RAPID URINE DRUG SCREEN, HOSP PERFORMED
AMPHETAMINES: NOT DETECTED
BARBITURATES: NOT DETECTED
BENZODIAZEPINES: NOT DETECTED
Cocaine: NOT DETECTED
Opiates: NOT DETECTED
TETRAHYDROCANNABINOL: POSITIVE — AB

## 2017-05-10 LAB — CBC
HEMATOCRIT: 40.9 % (ref 39.0–52.0)
Hemoglobin: 14 g/dL (ref 13.0–17.0)
MCH: 29.2 pg (ref 26.0–34.0)
MCHC: 34.2 g/dL (ref 30.0–36.0)
MCV: 85.4 fL (ref 78.0–100.0)
Platelets: 217 10*3/uL (ref 150–400)
RBC: 4.79 MIL/uL (ref 4.22–5.81)
RDW: 12.7 % (ref 11.5–15.5)
WBC: 4 10*3/uL (ref 4.0–10.5)

## 2017-05-10 LAB — COMPREHENSIVE METABOLIC PANEL
ALBUMIN: 3.8 g/dL (ref 3.5–5.0)
ALT: 19 U/L (ref 17–63)
AST: 23 U/L (ref 15–41)
Alkaline Phosphatase: 43 U/L (ref 38–126)
Anion gap: 6 (ref 5–15)
BILIRUBIN TOTAL: 1.1 mg/dL (ref 0.3–1.2)
BUN: 15 mg/dL (ref 6–20)
CHLORIDE: 106 mmol/L (ref 101–111)
CO2: 29 mmol/L (ref 22–32)
Calcium: 9.1 mg/dL (ref 8.9–10.3)
Creatinine, Ser: 1.32 mg/dL — ABNORMAL HIGH (ref 0.61–1.24)
GFR calc Af Amer: >60 mL/min (ref >60)
GFR calc non Af Amer: >60 mL/min (ref >60)
GLUCOSE: 139 mg/dL — AB (ref 65–99)
POTASSIUM: 4 mmol/L (ref 3.5–5.1)
Sodium: 141 mmol/L (ref 135–145)
Total Protein: 6.5 g/dL (ref 6.5–8.1)

## 2017-05-10 LAB — CBG MONITORING, ED
GLUCOSE-CAPILLARY: 109 mg/dL — AB (ref 65–99)
Glucose-Capillary: 213 mg/dL — ABNORMAL HIGH (ref 65–99)
Glucose-Capillary: 298 mg/dL — ABNORMAL HIGH (ref 65–99)

## 2017-05-10 LAB — ACETAMINOPHEN LEVEL: Acetaminophen (Tylenol), Serum: <10 ug/mL — ABNORMAL LOW (ref 10–30)

## 2017-05-10 LAB — ETHANOL: Alcohol, Ethyl (B): <10 mg/dL (ref <10)

## 2017-05-10 LAB — SALICYLATE LEVEL: Salicylate Lvl: <7 mg/dL (ref 2.8–30.0)

## 2017-05-10 MED ORDER — AMLODIPINE BESYLATE 5 MG PO TABS
5.0000 mg | ORAL_TABLET | Freq: Every day | ORAL | Status: DC
  Administered 2017-05-10 – 2017-05-11 (×2): 5 mg via ORAL
  Filled 2017-05-10 (×2): qty 1

## 2017-05-10 MED ORDER — INSULIN GLARGINE 100 UNIT/ML ~~LOC~~ SOLN
26.0000 [IU] | Freq: Every day | SUBCUTANEOUS | Status: DC
  Administered 2017-05-10: 26 [IU] via SUBCUTANEOUS
  Filled 2017-05-10 (×2): qty 0.26

## 2017-05-10 MED ORDER — INSULIN ASPART 100 UNIT/ML ~~LOC~~ SOLN
0.0000 [IU] | Freq: Three times a day (TID) | SUBCUTANEOUS | Status: DC
  Administered 2017-05-10 – 2017-05-11 (×2): 5 [IU] via SUBCUTANEOUS
  Administered 2017-05-11: 1 [IU] via SUBCUTANEOUS
  Filled 2017-05-10 (×3): qty 1

## 2017-05-10 MED ORDER — BASAGLAR KWIKPEN 100 UNIT/ML ~~LOC~~ SOPN: 30.0000 [IU] | PEN_INJECTOR | Freq: Every day | SUBCUTANEOUS | Status: DC

## 2017-05-10 MED ORDER — ACETAMINOPHEN 325 MG PO TABS: 650.0000 mg | ORAL_TABLET | ORAL | Status: DC | PRN

## 2017-05-10 MED ORDER — ACETAMINOPHEN 325 MG PO TABS
650.0000 mg | ORAL_TABLET | Freq: Once | ORAL | Status: AC
  Administered 2017-05-10: 650 mg via ORAL
  Filled 2017-05-10: qty 2

## 2017-05-10 MED ORDER — INSULIN ASPART 100 UNIT/ML ~~LOC~~ SOLN: 1.0000 [IU] | Freq: Three times a day (TID) | SUBCUTANEOUS | Status: DC

## 2017-05-10 MED ORDER — INSULIN ASPART 100 UNIT/ML ~~LOC~~ SOLN: 0.0000 [IU] | Freq: Three times a day (TID) | SUBCUTANEOUS | Status: DC

## 2017-05-10 MED ORDER — HYDROXYZINE HCL 25 MG PO TABS
50.0000 mg | ORAL_TABLET | Freq: Once | ORAL | Status: AC
  Administered 2017-05-11: 50 mg via ORAL
  Filled 2017-05-10: qty 2

## 2017-05-10 MED ORDER — SIMVASTATIN 20 MG PO TABS
20.0000 mg | ORAL_TABLET | Freq: Every day | ORAL | Status: DC
  Administered 2017-05-11: 20 mg via ORAL
  Filled 2017-05-10: qty 1

## 2017-05-10 NOTE — [facility] Assessment (Signed)
[facility] Assessment Progress Note Case was staffed with Shaune PollackLord DNP who recommended a inpatient admission for stabilization as appropriate bed placement is investigated.

## 2017-05-10 NOTE — Progress Notes (Addendum)
CSW reviewed pt chart. Per Nanine MeansJamison Lord, NP, pt meets criteria for inpatient hospitalization.  Pt [facility] the [facility]  Disposition:  CSW will continue to follow for placement. Timmothy EulerJean T. Kaylyn LimSutter, MSW, LCSWA Disposition Clinical Social Work [phone] (cell) [phone] (office)
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
## 2017-05-10 NOTE — ED Provider Notes (Signed)
Reports feeling homicidal towards his father.  He also is feeling suicidal.  Does not have plan.  He does have history of prior suicide attempt by injecting himself with insulin.  Patient is pleasant and cooperative Glasgow Coma Score 15.  He agrees with inpatient psychiatric stay   Doug SouJacubowitz, Matisyn Cabeza, MD 05/10/17 1701

## 2017-05-10 NOTE — ED Triage Notes (Signed)
[place] [facility]  Brought in by [employer] after having homicidal thoughts about his father.  Patient took an overdose of his insulin 3 months ago in order to harm himself.  Patient feels he is bullied in various jobs he has had.  Currently he has not been taking any of his medications except his insulin.  Patient is calm and cooperative at this time.  He is not IVC'd.

## 2017-05-11 DIAGNOSIS — F4310 Post-traumatic stress disorder, unspecified: Secondary | ICD-10-CM

## 2017-05-11 LAB — CBG MONITORING, ED
GLUCOSE-CAPILLARY: 51 mg/dL — AB (ref 65–99)
Glucose-Capillary: 145 mg/dL — ABNORMAL HIGH (ref 65–99)
Glucose-Capillary: 253 mg/dL — ABNORMAL HIGH (ref 65–99)

## 2017-05-11 MED ORDER — RISPERIDONE 0.5 MG PO TABS
0.5000 mg | ORAL_TABLET | Freq: Once | ORAL | Status: AC
  Administered 2017-05-11: 0.5 mg via ORAL
  Filled 2017-05-11: qty 1

## 2017-05-11 MED ORDER — RISPERIDONE 0.5 MG PO TABS: 0.5000 mg | ORAL_TABLET | Freq: Every day | ORAL | 0 refills | Status: DC

## 2017-05-11 MED ORDER — DIVALPROEX SODIUM ER 500 MG PO TB24: 1500.0000 mg | ORAL_TABLET | Freq: Every day | ORAL | Status: DC

## 2017-05-11 MED ORDER — RISPERIDONE 0.5 MG PO TABS: 0.5000 mg | ORAL_TABLET | Freq: Every day | ORAL | Status: DC

## 2017-05-11 MED ORDER — DIVALPROEX SODIUM ER 500 MG PO TB24: 1000.0000 mg | ORAL_TABLET | Freq: Every day | ORAL | Status: DC

## 2017-05-11 NOTE — Consult Note (Signed)
[facility] Consult   Reason for Consult:  Suicidal/homicidal ideations  Referring Physician:  EDP Patient Identification: [name] MRN:  [id number] Principal Diagnosis: PTSD (post-traumatic stress disorder) Diagnosis:   Patient Active Problem List   Diagnosis Date Noted  . PTSD (post-traumatic stress disorder) [F43.10] 03/26/2008    Priority: High  . 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 Knee pain [M25.569] 03/16/2016  . Scrotal pain [N50.82] 01/13/2016  . Seizures ([place]) [R56.9] 03/10/2015  . Type 1 diabetes mellitus with hyperlipidemia ([place]) [E10.69, E78.5] 03/10/2015  . 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 UNSPECIFIED VITAMIN D DEFICIENCY [E55.9] 06/16/2009  . Essential hypertension, benign [I10] 03/24/2008  . SLEEP DISORDER [G47.9] 03/24/2008  . SLEEP DEPRIVATION [G47.8] 02/09/2008  . DM (diabetes mellitus), type 1 with complications ([place]) [T61.4] 04/04/2007    Total Time spent with patient: 45 minutes  Subjective:   [name] is a 41 y.o. male patient does not warrant.  HPI:  41 yo male who presented to the ED with suicidal/homicidal ideations and wanting to start medications.  He was started on Risperdal for mood along with his medical medications.  Today, he reports he feels "fine" as he has had time and space to relax.  No suicidal/homicidal ideations, hallucinations, or substance abuse.  He was started on Risperdal and he liked the results, stable for discharge as he requests.  Past Psychiatric History: mood disorder  Risk to Self: None Risk to Others:NOne Prior Inpatient Therapy: Prior Inpatient Therapy: Yes Prior Therapy Dates: 2012 Prior Therapy Facilty/Provider(s): [facility] Reason for Treatment: MH issues Prior Outpatient Therapy: Prior Outpatient Therapy: No Prior Therapy Dates: NA Prior Therapy Facilty/Provider(s): NA Reason for Treatment: NA Does patient have an ACCT team?:  No Does patient have Intensive In-House Services?  : No Does patient have [facility] services? : Yes (Evaluated today) Does patient have P4CC services?: No  Past Medical History:  Past Medical History:  Diagnosis Date  . Diabetes mellitus   . Hypertension   . Seizures ([place])     Past Surgical History:  Procedure Laterality Date  . HERNIA REPAIR     Family History:  Family History  Problem Relation Age of Onset  . Hypertension Mother   . Diabetes Paternal Uncle    Family Psychiatric  History: none Social History:  History  Alcohol Use  . 0.6 oz/week  . 1 Glasses of wine per week    Comment: rarely     History  Drug Use No    Social History   Social History  . Marital status: Single    Spouse name: N/A  . Number of children: N/A  . Years of education: N/A   Social History Main Topics  . Smoking status: Never Smoker  . Smokeless tobacco: Never Used  . Alcohol use 0.6 oz/week    1 Glasses of wine per week     Comment: rarely  . Drug use: No  . Sexual activity: Not Asked   Other Topics Concern  . None   Social History Narrative  . None   Additional Social History:    Allergies:   Allergies  Allergen Reactions  . Ziprasidone Mesylate Other (See Comments)    "severe muscle spasms"    Labs:  Results for orders placed or performed during the hospital encounter of 05/10/17 (from the past 48 hour(s))  Rapid urine drug screen (hospital performed)     Status: Abnormal   Collection Time: 05/10/17  2:37 PM  Result Value Ref Range   Opiates NONE DETECTED NONE DETECTED   Cocaine NONE DETECTED NONE DETECTED   Benzodiazepines NONE DETECTED NONE DETECTED   Amphetamines NONE DETECTED NONE DETECTED   Tetrahydrocannabinol POSITIVE (A) NONE DETECTED   Barbiturates NONE DETECTED NONE DETECTED    Comment:        DRUG SCREEN FOR MEDICAL PURPOSES ONLY.  IF CONFIRMATION IS NEEDED FOR ANY PURPOSE, NOTIFY LAB WITHIN 5 DAYS.        LOWEST DETECTABLE LIMITS FOR URINE  DRUG SCREEN Drug Class       Cutoff (ng/mL) Amphetamine      1000 Barbiturate      200 Benzodiazepine   883 Tricyclics       254 Opiates          300 Cocaine          300 THC              50   Comprehensive metabolic panel     Status: Abnormal   Collection Time: 05/10/17  2:59 PM  Result Value Ref Range   Sodium 141 135 - 145 mmol/L   Potassium 4.0 3.5 - 5.1 mmol/L   Chloride 106 101 - 111 mmol/L   CO2 29 22 - 32 mmol/L   Glucose, Bld 139 (H) 65 - 99 mg/dL   BUN 15 6 - 20 mg/dL   Creatinine, Ser 1.32 (H) 0.61 - 1.24 mg/dL   Calcium 9.1 8.9 - 10.3 mg/dL   Total Protein 6.5 6.5 - 8.1 g/dL   Albumin 3.8 3.5 - 5.0 g/dL   AST 23 15 - 41 U/L   ALT 19 17 - 63 U/L   Alkaline Phosphatase 43 38 - 126 U/L   Total Bilirubin 1.1 0.3 - 1.2 mg/dL   GFR calc non Af Amer >60 >60 mL/min   GFR calc Af Amer >60 >60 mL/min    Comment: (NOTE) The eGFR has been calculated using the CKD EPI equation. This calculation has not been validated in all clinical situations. eGFR's persistently <60 mL/min signify possible Chronic Kidney Disease.    Anion gap 6 5 - 15  Ethanol     Status: None   Collection Time: 05/10/17  2:59 PM  Result Value Ref Range   Alcohol, Ethyl (B) <10 <10 mg/dL    Comment:        LOWEST DETECTABLE LIMIT FOR SERUM ALCOHOL IS 10 mg/dL FOR MEDICAL PURPOSES ONLY   Salicylate level     Status: None   Collection Time: 05/10/17  2:59 PM  Result Value Ref Range   Salicylate Lvl <9.8 2.8 - 30.0 mg/dL  Acetaminophen level     Status: Abnormal   Collection Time: 05/10/17  2:59 PM  Result Value Ref Range   Acetaminophen (Tylenol), Serum <10 (L) 10 - 30 ug/mL    Comment:        THERAPEUTIC CONCENTRATIONS VARY SIGNIFICANTLY. A RANGE OF 10-30 ug/mL MAY BE AN EFFECTIVE CONCENTRATION FOR MANY PATIENTS. HOWEVER, SOME ARE BEST TREATED AT CONCENTRATIONS OUTSIDE THIS RANGE. ACETAMINOPHEN CONCENTRATIONS >150 ug/mL AT 4 HOURS AFTER INGESTION AND >50 ug/mL AT 12 HOURS AFTER  INGESTION ARE OFTEN ASSOCIATED WITH TOXIC REACTIONS.   cbc     Status: None   Collection Time: 05/10/17  2:59 PM  Result Value Ref Range   WBC 4.0 4.0 - 10.5 K/uL   RBC 4.79 4.22 - 5.81 MIL/uL   Hemoglobin 14.0 13.0 - 17.0 g/dL  HCT 40.9 39.0 - 52.0 %   MCV 85.4 78.0 - 100.0 fL   MCH 29.2 26.0 - 34.0 pg   MCHC 34.2 30.0 - 36.0 g/dL   RDW 12.7 11.5 - 15.5 %   Platelets 217 150 - 400 K/uL  POC CBG, ED     Status: Abnormal   Collection Time: 05/10/17  3:46 PM  Result Value Ref Range   Glucose-Capillary 109 (H) 65 - 99 mg/dL  CBG monitoring, ED     Status: Abnormal   Collection Time: 05/10/17  5:14 PM  Result Value Ref Range   Glucose-Capillary 213 (H) 65 - 99 mg/dL  CBG monitoring, ED     Status: Abnormal   Collection Time: 05/10/17  9:23 PM  Result Value Ref Range   Glucose-Capillary 298 (H) 65 - 99 mg/dL   Comment 1 Notify RN    Comment 2 Document in Chart   CBG monitoring, ED     Status: Abnormal   Collection Time: 05/11/17  7:31 AM  Result Value Ref Range   Glucose-Capillary 51 (L) 65 - 99 mg/dL  CBG monitoring, ED     Status: Abnormal   Collection Time: 05/11/17  8:14 AM  Result Value Ref Range   Glucose-Capillary 145 (H) 65 - 99 mg/dL  CBG monitoring, ED     Status: Abnormal   Collection Time: 05/11/17 11:21 AM  Result Value Ref Range   Glucose-Capillary 253 (H) 65 - 99 mg/dL    Current Facility-Administered Medications  Medication Dose Route Frequency Provider Last Rate Last Dose  . acetaminophen (TYLENOL) tablet 650 mg  650 mg Oral Q4H PRN Valere Dross, Alyssa B, PA-C      . amLODipine (NORVASC) tablet 5 mg  5 mg Oral Daily Murray, Alyssa B, PA-C   5 mg at 05/11/17 0950  . [START ON 05/12/2017] divalproex (DEPAKOTE ER) 24 hr tablet 1,000 mg  1,000 mg Oral Daily Lord, Jamison Y, NP      . insulin aspart (novoLOG) injection 0-9 Units  0-9 Units Subcutaneous TID WC Orlie Dakin, MD   5 Units at 05/11/17 1351  . insulin aspart (novoLOG) injection 4 Units  4 Units  Subcutaneous Once Funches, Josalyn, MD      . insulin glargine (LANTUS) injection 26 Units  26 Units Subcutaneous QHS Ward, Ozella Almond, PA-C   26 Units at 05/10/17 2124  . simvastatin (ZOCOR) tablet 20 mg  20 mg Oral Daily Valere Dross, Alyssa B, PA-C   20 mg at 05/11/17 4680   Current Outpatient Prescriptions  Medication Sig Dispense Refill  . aspirin 81 MG tablet Take 81 mg by mouth daily.    . insulin aspart (NOVOLOG) 100 UNIT/ML injection Inject 1-4 Units into the skin 3 (three) times daily before meals. If sugar > 120 (Patient taking differently: Inject 1-3 Units into the skin 3 (three) times daily before meals. If sugar > 120) 30 mL 3  . insulin degludec (TRESIBA FLEXTOUCH) 100 UNIT/ML SOPN FlexTouch Pen Inject 0.28 mLs (28 Units total) into the skin daily. (Patient taking differently: Inject 26 Units into the skin every evening. ) 5 pen 2  . Omega-3 Fatty Acids (FISH OIL PO) Take 1 capsule by mouth daily.    . simvastatin (ZOCOR) 20 MG tablet Take 1 tablet (20 mg total) by mouth daily. 90 tablet 3  . amLODipine (NORVASC) 5 MG tablet Take 1 tablet (5 mg total) by mouth daily. 90 tablet 3  . divalproex (DEPAKOTE ER) 500 MG 24 hr tablet  Take 3 tablets (1,500 mg total) by mouth daily. 90 tablet 0  . glucose blood (CONTOUR NEXT TEST) test strip Use as instructed 5-6x daily 200 each 2  . Insulin Pen Needle (B-D ULTRAFINE III SHORT PEN) 31G X 8 MM MISC 1 application by Does not apply route daily. 100 each 3  . Insulin Syringe-Needle U-100 (B-D INS SYRINGE 0.5CC/31GX5/16) 31G X 5/16" 0.5 ML MISC 1 each by Does not apply route 3 (three) times daily. 90 each 11  . lisinopril (PRINIVIL,ZESTRIL) 20 MG tablet Take 1 tablet (20 mg total) by mouth every morning. 90 tablet 3  . ondansetron (ZOFRAN) 4 MG tablet Take 1 tablet (4 mg total) by mouth every 8 (eight) hours as needed for nausea or vomiting. (Patient not taking: Reported on 02/07/2017) 20 tablet 0  . TRUEPLUS LANCETS 28G MISC 1 each by Does not apply  route 3 (three) times daily. 100 each 11    Musculoskeletal: Strength & Muscle Tone: within normal limits Gait & Station: normal Patient leans: N/A  Psychiatric Specialty Exam: Physical Exam  Constitutional: He is oriented to person, place, and time. He appears well-developed and well-nourished.  HENT:  Head: Normocephalic.  Neck: Normal range of motion.  Respiratory: Effort normal.  Musculoskeletal: Normal range of motion.  Neurological: He is alert and oriented to person, place, and time.  Psychiatric: He has a normal mood and affect. His speech is normal and behavior is normal. Judgment and thought content normal. Cognition and memory are normal.    Review of Systems  All other systems reviewed and are negative.   Blood pressure (!) 155/79, pulse 71, temperature 98.2 F (36.8 C), temperature source Oral, resp. rate 20, SpO2 97 %.There is no height or weight on file to calculate BMI.  General Appearance: Casual  Eye Contact:  Good  Speech:  Normal Rate  Volume:  Normal  Mood:  Euthymic  Affect:  Congruent  Thought Process:  Coherent and Descriptions of Associations: Intact  Orientation:  Full (Time, Place, and Person)  Thought Content:  WDL and Logical  Suicidal Thoughts:  No  Homicidal Thoughts:  No  Memory:  Immediate;   Good Recent;   Good Remote;   Good  Judgement:  Fair  Insight:  Fair  Psychomotor Activity:  Normal  Concentration:  Concentration: Good and Attention Span: Good  Recall:  Good  Fund of Knowledge:  Fair  Language:  Good  Akathisia:  No  Handed:  Right  AIMS (if indicated):     Assets:  Leisure Time Physical Health Resilience Social Support  ADL's:  Intact  Cognition:  WNL  Sleep:        Treatment Plan Summary: Daily contact with patient to assess and evaluate symptoms and progress in treatment, Medication management and Plan PTSD:  -Crisis stabilization -Medication management:  Started Risperdal 0.5 mg daily for mood  stabilization -Individual counseling  Disposition: No evidence of imminent risk to self or others at present.    Waylan Boga, NP 05/11/2017 2:02 PM   Patient seen face-to-face for psychiatric evaluation, chart reviewed and case discussed with the physician extender and developed treatment plan. Reviewed the information documented and agree with the treatment plan.  Buford Dresser, DO

## 2017-05-11 NOTE — B[facility] Suicide Risk Assessment (Signed)
Suicide Risk Assessment  Discharge Assessment   [facility]   Principal Problem: PTSD (post-traumatic stress disorder) Discharge Diagnoses:  Patient Active Problem List   Diagnosis Date Noted  . PTSD (post-traumatic stress disorder) [F43.10] 03/26/2008    Priority: High  . 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 Knee pain [M25.569] 03/16/2016  . Scrotal pain [N50.82] 01/13/2016  . Seizures (HCC) [R56.9] 03/10/2015  . Type 1 diabetes mellitus with hyperlipidemia (HCC) [E10.69, E78.5] 03/10/2015  . 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 UNSPECIFIED VITAMIN D DEFICIENCY [E55.9] 06/16/2009  . Essential hypertension, benign [I10] 03/24/2008  . SLEEP DISORDER [G47.9] 03/24/2008  . SLEEP DEPRIVATION [G47.8] 02/09/2008  . DM (diabetes mellitus), type 1 with complications (HCC) [E10.8] 04/04/2007    Total Time spent with patient: 45 minutes   M Musculoskeletal: Strength & Muscle Tone: within normal limits Gait & Station: normal Patient leans: N/A  Psychiatric Specialty Exam: Physical Exam  Constitutional: He is oriented to person, place, and time. He appears well-developed and well-nourished.  HENT:  Head: Normocephalic.  Neck: Normal range of motion.  Respiratory: Effort normal.  Musculoskeletal: Normal range of motion.  Neurological: He is alert and oriented to person, place, and time.  Psychiatric: He has a normal mood and affect. His speech is normal and behavior is normal. Judgment and thought content normal. Cognition and memory are normal.    Review of Systems  All other systems reviewed and are negative.   Blood pressure (!) 155/79, pulse 71, temperature 98.2 F (36.8 C), temperature source Oral, resp. rate 20, SpO2 97 %.There is no height or weight on file to calculate BMI.  General Appearance: Casual  Eye Contact:  Good  Speech:  Normal Rate  Volume:  Normal  Mood:  Euthymic   Affect:  Congruent  Thought Process:  Coherent and Descriptions of Associations: Intact  Orientation:  Full (Time, Place, and Person)  Thought Content:  WDL and Logical  Suicidal Thoughts:  No  Homicidal Thoughts:  No  Memory:  Immediate;   Good Recent;   Good Remote;   Good  Judgement:  Fair  Insight:  Fair  Psychomotor Activity:  Normal  Concentration:  Concentration: Good and Attention Span: Good  Recall:  Good  Fund of Knowledge:  Fair  Language:  Good  Akathisia:  No  Handed:  Right  AIMS (if indicated):     Assets:  Leisure Time Physical Health Resilience Social Support  ADL's:  Intact  Cognition:  WNL  Sleep:       Mental Status Per Nursing Assessment::   On Admission:   suicidal/homicidal ideations  Demographic Factors:  Male  Loss Factors: NA  Historical Factors: NA  Risk Reduction Factors:   Sense of responsibility to family, Living with another person, especially a relative and Positive social support  Continued Clinical Symptoms:  NOne  Cognitive Features That Contribute To Risk:  None    Suicide Risk:  Minimal: No identifiable suicidal ideation.  Patients presenting with no risk factors but with morbid ruminations; may be classified as minimal risk based on the severity of the depressive symptoms    Plan Of Care/Follow-up recommendations:  Activity:  as tolerateed Diet:  heart healthy diet  LORD, JAMISON, NP 05/11/2017, 2:13 PM

## 2017-05-11 NOTE — ED Notes (Addendum)
On admission to the [facility] pt states that he is feeling better today, but he would like to stay and get on a medication that will help. He would like to stay long enough to know how the medication will affect him, but thinks he will be ready for D/C this afternoon.

## 2017-05-11 NOTE — ED Notes (Signed)
Pt discharged home. Discharged instructions read to pt who verbalized understanding. All belongings returned to pt who signed for same. Denies SI/HI, is not delusional and not responding to internal stimuli. Escorted pt to the ED exit.    

## 2017-05-27 NOTE — Progress Notes (Deleted)
Psychiatric Initial Adult Assessment   Patient Identification: [name] MRN:  [id number] Date of Evaluation:  05/27/2017 Referral Source: *** Chief Complaint:   Visit Diagnosis: No diagnosis found.  History of Present Illness:   Donald CouncilmanRico N Grimme is a [age] year old male with depression, PTSD r/o bipolar disorder, type I diabetes, hypertension, history of seizure, who is referred to establish care.  Per chart review, [place] [facility] with HI against his father and SI. Per chart, he did have suicide attempt of injecting on insulin a few months ago. He was started on mirtazapine and risperidone.   Associated Signs/Symptoms: Depression Symptoms:  {DEPRESSION SYMPTOMS:20000} (Hypo) Manic Symptoms:  {B[facility] MANIC SYMPTOMS:22872} Anxiety Symptoms:  {[facility] ANXIETY SYMPTOMS:22873} Psychotic Symptoms:  {B[facility] PSYCHOTIC SYMPTOMS:22874} PTSD Symptoms: {B[facility] PTSD NWGNFAOZ:30865}SYMPTOMS:22875}  Past Psychiatric History:  Outpatient:  [facility] Previous suicide attempt:  Past trials of medication:  History of violence:   Previous Psychotropic Medications: {YES/NO:21197}  Substance Abuse History in the last 12 months:  {yes no:314532}  Consequences of Substance Abuse: {[facility] CONSEQUENCES OF SUBSTANCE ABUSE:22880}  Past Medical History:  Past Medical History:  Diagnosis Date  . Diabetes mellitus   . Hypertension   . Seizures (HCC)     Past Surgical History:  Procedure Laterality Date  . HERNIA REPAIR      Family Psychiatric History: ***  Family History:  Family History  Problem Relation Age of Onset  . Hypertension Mother   . Diabetes Paternal Uncle     Social History:   Social History   Socioeconomic History  . Marital status: Single    Spouse name: Not on file  . Number of children: Not on file  . Years of education: Not on file  . Highest education level: Not on file  Social Needs  . Financial  resource strain: Not on file  . Food insecurity - worry: Not on file  . Food insecurity - inability: Not on file  . Transportation needs - medical: Not on file  . Transportation needs - non-medical: Not on file  Occupational History  . Not on file  Tobacco Use  . Smoking status: Never Smoker  . Smokeless tobacco: Never Used  Substance and Sexual Activity  . Alcohol use: Yes    Alcohol/week: 0.6 oz    Types: 1 Glasses of wine per week    Comment: rarely  . Drug use: No  . Sexual activity: Not on file  Other Topics Concern  . Not on file  Social History Narrative  . Not on file    Additional Social History: ***  Allergies:   Allergies  Allergen Reactions  . Ziprasidone Mesylate Other (See Comments)    "severe muscle spasms"    Metabolic Disorder Labs: Lab Results  Component Value Date   HGBA1C 9.2 05/08/2017   MPG 235 (H) 01/02/2011   MPG 177 11/02/2008   No results found for: PROLACTIN Lab Results  Component Value Date   CHOL 135 12/16/2015   TRIG 32 12/16/2015   HDL 78 12/16/2015   CHOLHDL 1.7 12/16/2015   VLDL 6 12/16/2015   LDLCALC 51 12/16/2015   LDLCALC 121 (H) 10/13/2014     Current Medications: Current Outpatient Medications  Medication Sig Dispense Refill  . amLODipine (NORVASC) 5 MG tablet Take 1 tablet (5 mg total) by mouth daily. 90 tablet 3  . aspirin 81 MG tablet Take 81 mg by mouth  daily.    . divalproex (DEPAKOTE ER) 500 MG 24 hr tablet Take 3 tablets (1,500 mg total) by mouth daily. 90 tablet 0  . glucose blood (CONTOUR NEXT TEST) test strip Use as instructed 5-6x daily 200 each 2  . insulin aspart (NOVOLOG) 100 UNIT/ML injection Inject 1-4 Units into the skin 3 (three) times daily before meals. If sugar > 120 (Patient taking differently: Inject 1-3 Units into the skin 3 (three) times daily before meals. If sugar > 120) 30 mL 3  . insulin degludec (TRESIBA FLEXTOUCH) 100 UNIT/ML SOPN FlexTouch Pen Inject 0.28 mLs (28 Units total) into the  skin daily. (Patient taking differently: Inject 26 Units into the skin every evening. ) 5 pen 2  . Insulin Pen Needle (B-D ULTRAFINE III SHORT PEN) 31G X 8 MM MISC 1 application by Does not apply route daily. 100 each 3  . Insulin Syringe-Needle U-100 (B-D INS SYRINGE 0.5CC/31GX5/16) 31G X 5/16" 0.5 ML MISC 1 each by Does not apply route 3 (three) times daily. 90 each 11  . lisinopril (PRINIVIL,ZESTRIL) 20 MG tablet Take 1 tablet (20 mg total) by mouth every morning. 90 tablet 3  . Omega-3 Fatty Acids (FISH OIL PO) Take 1 capsule by mouth daily.    . ondansetron (ZOFRAN) 4 MG tablet Take 1 tablet (4 mg total) by mouth every 8 (eight) hours as needed for nausea or vomiting. (Patient not taking: Reported on 02/07/2017) 20 tablet 0  . risperiDONE (RISPERDAL) 0.5 MG tablet Take 1 tablet (0.5 mg total) by mouth daily. 30 tablet 0  . simvastatin (ZOCOR) 20 MG tablet Take 1 tablet (20 mg total) by mouth daily. 90 tablet 3  . TRUEPLUS LANCETS 28G MISC 1 each by Does not apply route 3 (three) times daily. 100 each 11   Current Facility-Administered Medications  Medication Dose Route Frequency Provider Last Rate Last Dose  . insulin aspart (novoLOG) injection 4 Units  4 Units Subcutaneous Once Dessa PhiFunches, Josalyn, MD        Neurologic: Headache: No Seizure: No Paresthesias:No  Musculoskeletal: Strength & Muscle Tone: within normal limits Gait & Station: normal Patient leans: N/A  Psychiatric Specialty Exam: ROS  There were no vitals taken for this visit.There is no height or weight on file to calculate BMI.  General Appearance: Fairly Groomed  Eye Contact:  Good  Speech:  Clear and Coherent  Volume:  Normal  Mood:  {B[facility] MOOD:22306}  Affect:  {Affect (PAA):22687}  Thought Process:  Coherent and Goal Directed  Orientation:  Full (Time, Place, and Person)  Thought Content:  Logical  Suicidal Thoughts:  {ST/HT (PAA):22692}  Homicidal Thoughts:  {ST/HT (PAA):22692}  Memory:  Immediate;    Good Recent;   Good Remote;   Good  Judgement:  {Judgement (PAA):22694}  Insight:  {Insight (PAA):22695}  Psychomotor Activity:  Normal  Concentration:  Concentration: Good and Attention Span: Good  Recall:  Good  Fund of Knowledge:Good  Language: Good  Akathisia:  No  Handed:  Right  AIMS (if indicated):  N/A  Assets:  Communication Skills Desire for Improvement  ADL's:  Intact  Cognition: WNL  Sleep:  ***   Assessment  Plan  The patient demonstrates the following risk factors for suicide: Chronic risk factors for suicide include: {Chronic Risk Factors for VHQIONG:29528413}Suicide:30414011}. Acute risk factors for suicide include: {Acute Risk Factors for KGMWNUU:72536644}Suicide:30414012}. Protective factors for this patient include: {Protective Factors for Suicide IHKV:42595638}Risk:30414013}. Considering these factors, the overall suicide risk at this point appears to be {  Desc; low/moderate/high:110033}. Patient {ACTION; IS/IS JYN:82956213} appropriate for outpatient follow up.   Treatment Plan Summary: Plan as above   Neysa Hotter, MD 11/19/20181:24 PM

## 2017-06-03 ENCOUNTER — Ambulatory Visit (HOSPITAL_COMMUNITY): Payer: Self-pay | Admitting: Psychiatry

## 2017-06-05 ENCOUNTER — Ambulatory Visit: Payer: Self-pay | Admitting: Nurse Practitioner

## 2017-06-05 ENCOUNTER — Other Ambulatory Visit: Payer: Self-pay

## 2017-06-06 ENCOUNTER — Telehealth: Payer: Self-pay | Admitting: Nurse Practitioner

## 2017-06-06 NOTE — Telephone Encounter (Signed)
Patient needs tresiba switched to a cheaper insulin. Please fu

## 2017-06-07 ENCOUNTER — Telehealth: Payer: Self-pay | Admitting: Nurse Practitioner

## 2017-06-07 NOTE — Telephone Encounter (Signed)
I do not see an alternate medication. Does the patient know what it was switched to and does he need a refill?

## 2017-06-07 NOTE — Telephone Encounter (Signed)
Patient is asking for you to put him on a different insulin he doesn't know what kind he just said he cannot pay the $200 for the medication

## 2017-06-10 ENCOUNTER — Ambulatory Visit: Payer: Self-pay

## 2017-06-10 ENCOUNTER — Ambulatory Visit: Payer: Self-pay | Attending: Nurse Practitioner | Admitting: Nurse Practitioner

## 2017-06-10 ENCOUNTER — Encounter: Payer: Self-pay | Admitting: Nurse Practitioner

## 2017-06-10 VITALS — BP 118/71 | HR 58 | Temp 98.4°F | Ht 72.0 in | Wt 172.0 lb

## 2017-06-10 DIAGNOSIS — I10 Essential (primary) hypertension: Secondary | ICD-10-CM | POA: Insufficient documentation

## 2017-06-10 DIAGNOSIS — Z79899 Other long term (current) drug therapy: Secondary | ICD-10-CM | POA: Insufficient documentation

## 2017-06-10 DIAGNOSIS — Z794 Long term (current) use of insulin: Secondary | ICD-10-CM | POA: Insufficient documentation

## 2017-06-10 DIAGNOSIS — E108 Type 1 diabetes mellitus with unspecified complications: Secondary | ICD-10-CM | POA: Insufficient documentation

## 2017-06-10 DIAGNOSIS — Z7982 Long term (current) use of aspirin: Secondary | ICD-10-CM | POA: Insufficient documentation

## 2017-06-10 DIAGNOSIS — G40909 Epilepsy, unspecified, not intractable, without status epilepticus: Secondary | ICD-10-CM | POA: Insufficient documentation

## 2017-06-10 LAB — GLUCOSE, POCT (MANUAL RESULT ENTRY): POC GLUCOSE: 78 mg/dL (ref 70–99)

## 2017-06-10 MED ORDER — INSULIN PEN NEEDLE 31G X 5 MM MISC: 1 refills | Status: DC

## 2017-06-10 MED ORDER — INSULIN GLARGINE 100 UNIT/ML SOLOSTAR PEN: 25.0000 [IU] | PEN_INJECTOR | Freq: Every day | SUBCUTANEOUS | 3 refills | Status: DC

## 2017-06-10 MED ORDER — INSULIN DEGLUDEC 100 UNIT/ML ~~LOC~~ SOPN: 28.0000 [IU] | PEN_INJECTOR | Freq: Every day | SUBCUTANEOUS | 2 refills | Status: DC

## 2017-06-10 MED ORDER — DIVALPROEX SODIUM ER 500 MG PO TB24: 1500.0000 mg | ORAL_TABLET | Freq: Every day | ORAL | 0 refills | Status: DC

## 2017-06-10 MED ORDER — INSULIN ASPART 100 UNIT/ML ~~LOC~~ SOLN: 1.0000 [IU] | Freq: Three times a day (TID) | SUBCUTANEOUS | 3 refills | Status: DC

## 2017-06-10 MED FILL — TRUEPLUS PEN NDL 31GX3/16": 31G X 5 MM | 30 days supply | Qty: 100 | Fill #0

## 2017-06-10 MED FILL — !LANTUS SOLOSTAR 100UNITS/M: 100 | 30 days supply | Qty: 9 | Fill #0

## 2017-06-10 MED FILL — !NOVOLOG 100UNITS/ML VIAL: 100/ML | 28 days supply | Qty: 10 | Fill #0

## 2017-06-10 MED FILL — DIVALPROEX SOD ER 500 MG TA: 500 | 30 days supply | Qty: 90 | Fill #0

## 2017-06-10 MED FILL — TRUEPLUS PEN NDL 31GX3/16: 31G X 5 MM | 30 days supply | Qty: 100 | Fill #0

## 2017-06-10 NOTE — Progress Notes (Signed)
Patient here for lab visit only 

## 2017-06-10 NOTE — Progress Notes (Signed)
poct

## 2017-06-10 NOTE — Progress Notes (Signed)
Assessment & Plan:  [name] was seen today for follow-up.  Diagnoses and all orders for this visit:  DM (diabetes mellitus), type 1 with complications (HCC) -     TSH -     Glucose (CBG) -     insulin aspart (NOVOLOG) 100 UNIT/ML injection; Inject 1-4 Units into the skin 3 (three) times daily before meals. If sugar > 120 -     Discontinue: insulin degludec (TRESIBA FLEXTOUCH) 100 UNIT/ML SOPN FlexTouch Pen; Inject 0.28 mLs (28 Units total) into the skin daily. -     Insulin Pen Needle (B-D UF III MINI PEN NEEDLES) 31G X 5 MM MISC; Insert into the skin as prescribed -     Insulin Glargine (LANTUS) 100 UNIT/ML Solostar Pen; Inject 25 Units into the skin daily. May increase Lantus by 2 units every 3 days until fasting blood sugar is 100-130. Diabetes blood sugar goals  Fasting in AM before breakfast which means at least 8 hrs of no eating or drinking) except water or unsweetened coffee or tea): 90-110 2 hrs after meals: < 160,   Hypoglycemia or low blood sugar: < 70 (You should not have hypoglycemia.)  Aim for 30 minutes of exercise most days. Rethink what you drink. Water is great! Aim for 2-3 Carb Choices per meal (30-45 grams) +/- 1 either way  Aim for 0-15 Carbs per snack if hungry  Include protein in moderation with your meals and snacks  Consider reading food labels for Total Carbohydrate and Fat Grams of foods  Consider checking BG at alternate times per day  Continue taking medication as directed Be mindful about how much sugar you are adding to beverages and other foods. Fruit Punch - find one with no sugar  Measure and decrease portions of carbohydrate foods  Make your plate and don't go back for seconds   hypertension, benign Continue all antihypertensives as prescribed.  Remember to bring in your blood pressure log with you for your follow up appointment.  DASH/Mediterranean Diets are healthier choices for HTN.    Seizure disorder (HCC) -     Valproic acid  level -     divalproex (DEPAKOTE ER) 500 MG 24 hr tablet; Take 3 tablets (1,500 mg total) by mouth daily.   Patient has been counseled on age-appropriate routine health concerns for screening and prevention. These are reviewed and up-to-date. Referrals have been placed accordingly. Immunizations are up-to-date or declined.    Subjective:   Chief Complaint  Patient presents with  . Follow-up    Patient would like to talk about his medications and need medications refills.    HPI [name] 41 y.o. male presents to office today for follow up of his DMI. He is still waiting to be seen by endocrinology.   Essential hypertension Blood pressure is well controlled on norvasc 5mg  daily and lisinopril 20mg  daily. He denies any chest pain, shortness of breath, palpitations, lightheadedness, dizziness, headaches and bilateral lower extremity edema. Cardiovascular risk factors include DM, dyslipidemia, gender and HTN. He endorses medication compliance.  BP Readings from Last 3 Encounters:  06/10/17 118/71  05/11/17 128/69  05/08/17 138/77    DM I  Checking blood sugars TID. Blood sugars running "all over the place". He is eating sporadically. Reports only eating dinner sometimes and no other meals during the day. His glucose today is 78.  His clinical course has fluctuated. Insulin dosage review with [name] suggested probably noncompliance though I cannot elicit that specific history. Associated symptoms  of hyperglycemia have been blurry vision.  Associated symptoms of hypoglycemia have been confusion and jitteriness.  He is currently taking Lantus 25 units nightly and Novolog 1-4 units TID.  Insulin injections are given by patient.  Compliance with blood glucose monitoring: fair.  The patient does perform independently. Rotation of sites for injection: abdominal wall, arm(s) and thigh(s) Exercise: rarely however he does walk often due to lack of transportation.  Meal panning: He is using  carbohydrate counting/sporadically  Lab Results  Component Value Date   HGBA1C 9.2 05/08/2017     Review of Systems  Constitutional: Negative for fever, malaise/fatigue and weight loss.  HENT: Negative.  Negative for nosebleeds.   Eyes: Positive for blurred vision. Negative for double vision and photophobia.  Respiratory: Negative.  Negative for cough and shortness of breath.   Cardiovascular: Negative.  Negative for chest pain, palpitations and leg swelling.  Gastrointestinal: Negative.  Negative for abdominal pain, constipation, diarrhea, heartburn, nausea and vomiting.  Musculoskeletal: Negative.  Negative for myalgias.  Neurological: Negative.  Negative for dizziness, focal weakness, seizures and headaches.  Endo/Heme/Allergies: Negative for environmental allergies.  Psychiatric/Behavioral: Negative.  Negative for suicidal ideas.    Past Medical History:  Diagnosis Date  . Diabetes mellitus   . Hypertension   . Seizures (HCC)     Past Surgical History:  Procedure Laterality Date  . HERNIA REPAIR      Family History  Problem Relation Age of Onset  . Hypertension Mother   . Diabetes Paternal Uncle     Social History Reviewed with no changes to be made today.   Outpatient Medications Prior to Visit  Medication Sig Dispense Refill  . amLODipine (NORVASC) 5 MG tablet Take 1 tablet (5 mg total) by mouth daily. 90 tablet 3  . glucose blood (CONTOUR NEXT TEST) test strip Use as instructed 5-6x daily 200 each 2  . Insulin Syringe-Needle U-100 (B-D INS SYRINGE 0.5CC/31GX5/16) 31G X 5/16" 0.5 ML MISC 1 each by Does not apply route 3 (three) times daily. 90 each 11  . lisinopril (PRINIVIL,ZESTRIL) 20 MG tablet Take 1 tablet (20 mg total) by mouth every morning. 90 tablet 3  . Omega-3 Fatty Acids (FISH OIL PO) Take 1 capsule by mouth daily.    . risperiDONE (RISPERDAL) 0.5 MG tablet Take 1 tablet (0.5 mg total) by mouth daily. 30 tablet 0  . simvastatin (ZOCOR) 20 MG tablet Take  1 tablet (20 mg total) by mouth daily. 90 tablet 3  . TRUEPLUS LANCETS 28G MISC 1 each by Does not apply route 3 (three) times daily. 100 each 11  . divalproex (DEPAKOTE ER) 500 MG 24 hr tablet Take 3 tablets (1,500 mg total) by mouth daily. 90 tablet 0  . insulin aspart (NOVOLOG) 100 UNIT/ML injection Inject 1-4 Units into the skin 3 (three) times daily before meals. If sugar > 120 (Patient taking differently: Inject 1-3 Units into the skin 3 (three) times daily before meals. If sugar > 120) 30 mL 3  . insulin degludec (TRESIBA FLEXTOUCH) 100 UNIT/ML SOPN FlexTouch Pen Inject 0.28 mLs (28 Units total) into the skin daily. (Patient taking differently: Inject 26 Units into the skin every evening. ) 5 pen 2  . Insulin Pen Needle (B-D ULTRAFINE III SHORT PEN) 31G X 8 MM MISC 1 application by Does not apply route daily. 100 each 3  . aspirin 81 MG tablet Take 81 mg by mouth daily.    . ondansetron (ZOFRAN) 4 MG tablet Take 1 tablet (  4 mg total) by mouth every 8 (eight) hours as needed for nausea or vomiting. (Patient not taking: Reported on 02/07/2017) 20 tablet 0  . insulin aspart (novoLOG) injection 4 Units      No facility-administered medications prior to visit.     Allergies  Allergen Reactions  . Ziprasidone Mesylate Other (See Comments)    "severe muscle spasms"       Objective:    BP 118/71 (BP Location: Left Arm, Patient Position: Sitting, Cuff Size: Normal)   Pulse (!) 58   Temp 98.4 F (36.9 C) (Oral)   Ht 6' (1.829 m)   Wt 172 lb (78 kg)   SpO2 99%   BMI 23.33 kg/m  Wt Readings from Last 3 Encounters:  06/10/17 172 lb (78 kg)  05/08/17 177 lb (80.3 kg)  02/07/17 186 lb (84.4 kg)    Physical Exam  Constitutional: He is oriented to person, place, and time. He appears well-developed and well-nourished. He is cooperative.  HENT:  Head: Normocephalic and atraumatic.  Eyes: EOM are normal.  Wearing glasses  Neck: Normal range of motion.  Cardiovascular: Normal rate,  regular rhythm, normal heart sounds and intact distal pulses. Exam reveals no gallop and no friction rub.  No murmur heard. Pulmonary/Chest: Effort normal and breath sounds normal. No tachypnea. No respiratory distress. He has no decreased breath sounds. He has no wheezes. He has no rhonchi. He has no rales. He exhibits no tenderness.  Abdominal: Soft. Bowel sounds are normal.  Musculoskeletal: Normal range of motion. He exhibits no edema.  Neurological: He is alert and oriented to person, place, and time. Coordination normal.  Skin: Skin is warm and dry.  Psychiatric: He has a normal mood and affect. His behavior is normal. Judgment and thought content normal.  Nursing note and vitals reviewed.     Patient has been counseled extensively about nutrition and exercise as well as the importance of adherence with medications and regular follow-up. The patient was given clear instructions to go to ER or return to medical center if symptoms don't improve, worsen or new problems develop. The patient verbalized understanding.   Follow-up: Return in about 8 weeks (around 08/05/2017) for DM1 .   Claiborne Rigg, FNP-BC [facility] [place], [place] [phone]   06/10/2017, 10:59 PM

## 2017-06-11 LAB — TSH: TSH: 2.1 u[IU]/mL (ref 0.450–4.500)

## 2017-06-11 LAB — VALPROIC ACID LEVEL: Valproic Acid Lvl: 18 ug/mL — ABNORMAL LOW (ref 50–100)

## 2017-06-11 NOTE — Telephone Encounter (Signed)
[facility]

## 2017-06-11 NOTE — Telephone Encounter (Signed)
-----   Message from Claiborne RiggZelda W Fleming, NP sent at 06/11/2017 11:52 AM EST ----- Valproic acid level is low. Please call patient. Has he been taking his Depakote as prescribed or missing doses due to costs?

## 2017-06-11 NOTE — Telephone Encounter (Signed)
-----   Message from Zelda W Fleming, NP sent at 06/11/2017 11:52 AM EST ----- Valproic acid level is low. Please call patient. Has he been taking his Depakote as prescribed or missing doses due to costs? 

## 2017-06-11 NOTE — Telephone Encounter (Signed)
Patient inform on result.  Patient stated that he take his Depakote at night only.

## 2017-06-12 NOTE — Telephone Encounter (Signed)
Noted! Thank you

## 2017-06-12 NOTE — Telephone Encounter (Signed)
Please let patient know it is very important that he take his depakote as prescribed to help prevent seizure re occurrence.

## 2017-06-12 NOTE — Telephone Encounter (Signed)
Called patient and let him know to take his Depakote as prescribed to prevent seizure. Patient understood.

## 2017-06-18 ENCOUNTER — Ambulatory Visit: Payer: Self-pay

## 2017-07-16 ENCOUNTER — Ambulatory Visit: Payer: Self-pay | Attending: Nurse Practitioner

## 2017-07-25 MED FILL — !LANTUS SOLOSTAR 100UNITS/M: 100 | 30 days supply | Qty: 9 | Fill #1

## 2017-08-05 ENCOUNTER — Other Ambulatory Visit: Payer: Self-pay

## 2017-08-05 ENCOUNTER — Ambulatory Visit: Payer: Self-pay | Attending: Nurse Practitioner | Admitting: Nurse Practitioner

## 2017-08-05 ENCOUNTER — Encounter: Payer: Self-pay | Admitting: Nurse Practitioner

## 2017-08-05 VITALS — BP 133/81 | HR 63 | Temp 97.9°F | Resp 12 | Wt 172.0 lb

## 2017-08-05 DIAGNOSIS — E1069 Type 1 diabetes mellitus with other specified complication: Secondary | ICD-10-CM | POA: Insufficient documentation

## 2017-08-05 DIAGNOSIS — Z794 Long term (current) use of insulin: Secondary | ICD-10-CM | POA: Insufficient documentation

## 2017-08-05 DIAGNOSIS — Z79899 Other long term (current) drug therapy: Secondary | ICD-10-CM | POA: Insufficient documentation

## 2017-08-05 DIAGNOSIS — Z833 Family history of diabetes mellitus: Secondary | ICD-10-CM | POA: Insufficient documentation

## 2017-08-05 DIAGNOSIS — Z9889 Other specified postprocedural states: Secondary | ICD-10-CM | POA: Insufficient documentation

## 2017-08-05 DIAGNOSIS — G40909 Epilepsy, unspecified, not intractable, without status epilepticus: Secondary | ICD-10-CM | POA: Insufficient documentation

## 2017-08-05 DIAGNOSIS — E1065 Type 1 diabetes mellitus with hyperglycemia: Secondary | ICD-10-CM | POA: Insufficient documentation

## 2017-08-05 DIAGNOSIS — E785 Hyperlipidemia, unspecified: Secondary | ICD-10-CM | POA: Insufficient documentation

## 2017-08-05 DIAGNOSIS — I10 Essential (primary) hypertension: Secondary | ICD-10-CM | POA: Insufficient documentation

## 2017-08-05 DIAGNOSIS — E108 Type 1 diabetes mellitus with unspecified complications: Secondary | ICD-10-CM

## 2017-08-05 DIAGNOSIS — Z9119 Patient's noncompliance with other medical treatment and regimen: Secondary | ICD-10-CM | POA: Insufficient documentation

## 2017-08-05 DIAGNOSIS — Z8249 Family history of ischemic heart disease and other diseases of the circulatory system: Secondary | ICD-10-CM | POA: Insufficient documentation

## 2017-08-05 DIAGNOSIS — Z7982 Long term (current) use of aspirin: Secondary | ICD-10-CM | POA: Insufficient documentation

## 2017-08-05 DIAGNOSIS — Z888 Allergy status to other drugs, medicaments and biological substances status: Secondary | ICD-10-CM | POA: Insufficient documentation

## 2017-08-05 LAB — POCT GLYCOSYLATED HEMOGLOBIN (HGB A1C): HEMOGLOBIN A1C: 9.6

## 2017-08-05 LAB — GLUCOSE, POCT (MANUAL RESULT ENTRY): POC Glucose: 104 mg/dL — AB (ref 70–99)

## 2017-08-05 MED ORDER — GLUCOSE BLOOD VI STRP: ORAL_STRIP | 12 refills | Status: DC

## 2017-08-05 MED ORDER — DIVALPROEX SODIUM ER 500 MG PO TB24: 1500.0000 mg | ORAL_TABLET | Freq: Every day | ORAL | 3 refills | Status: DC

## 2017-08-05 MED ORDER — SIMVASTATIN 20 MG PO TABS: 20.0000 mg | ORAL_TABLET | Freq: Every day | ORAL | 3 refills | Status: DC

## 2017-08-05 MED ORDER — AMLODIPINE BESYLATE 5 MG PO TABS: 5.0000 mg | ORAL_TABLET | Freq: Every day | ORAL | 3 refills | Status: DC

## 2017-08-05 MED ORDER — LISINOPRIL 20 MG PO TABS: 20.0000 mg | ORAL_TABLET | Freq: Every morning | ORAL | 3 refills | Status: DC

## 2017-08-05 MED FILL — LISINOPRIL 20 MG TAB: 20 | 30 days supply | Qty: 30 | Fill #0

## 2017-08-05 MED FILL — DIVALPROEX SOD ER 500 MG TA: 500 | 30 days supply | Qty: 90 | Fill #0

## 2017-08-05 MED FILL — SIMVASTATIN 20 MG TABLET: 20 | 30 days supply | Qty: 30 | Fill #0

## 2017-08-05 MED FILL — ?AMLODIPINE BESYLATE 5 MG T: 5 MG | 30 days supply | Qty: 30 | Fill #0

## 2017-08-05 NOTE — Progress Notes (Signed)
F/u DM 

## 2017-08-05 NOTE — Patient Instructions (Addendum)
How to Avoid Diabetes Mellitus Problems You can take action to prevent or slow down problems that are caused by diabetes (diabetes mellitus). Following your diabetes plan and taking care of yourself can reduce your risk of serious or life-threatening complications. Manage your diabetes  Follow instructions from your health care providers about managing your diabetes. Your diabetes may be managed by a team of health care providers who can teach you how to care for yourself and can answer questions that you have.  Educate yourself about your condition so you can make healthy choices about eating and physical activity.  Check your blood sugar (glucose) levels as often as directed. Your health care provider will help you decide how often to check your blood glucose level depending on your treatment goals and how well you are meeting them.  Ask your health care provider if you should take low-dose aspirin daily and what dose is recommended for you. Taking low-dose aspirin daily is recommended to help prevent cardiovascular disease. Do not use nicotine or tobacco Do not use any products that contain nicotine or tobacco, such as cigarettes and e-cigarettes. If you need help quitting, ask your health care provider. Nicotine raises your risk for diabetes problems. If you quit using nicotine:  You will lower your risk for heart attack, stroke, nerve disease, and kidney disease.  Your cholesterol and blood pressure may improve.  Your blood circulation will improve.  Keep your blood pressure under control To control your blood pressure:  Follow instructions from your health care provider about meal planning, exercise, and medicines.  Make sure your health care provider checks your blood pressure at every medical visit.  A blood pressure reading consists of two numbers. Generally, the goal is to keep your top number (systolic pressure) at or below 130, and your bottom number (diastolic pressure) at or  below 80. Your health care provider may recommend a lower target blood pressure. Your individualized target blood pressure is determined based on:  Your age.  Your medicines.  How long you have had diabetes.  Any other medical conditions you have.  Keep your cholesterol under control To control your cholesterol:  Follow instructions from your health care provider about meal planning, exercise, and medicines.  Have your cholesterol checked at least once a year.  You may be prescribed medicine to lower cholesterol (statin). If you are not taking a statin, ask your health care provider if you should be.  Controlling your cholesterol may:  Help prevent heart disease and stroke. These are the most common health problems for people with diabetes.  Improve your blood flow.  Schedule and keep yearly physical exams and eye exams Your health care provider will tell you how often you need medical visits depending on your diabetes management plan. Keep all follow-up visits as directed. This is important so possible problems can be identified early and complications can be avoided or treated.  Every visit with your health care provider should include measuring your: ? Weight. ? Blood pressure. ? Blood glucose control.  Your A1c (hemoglobin A1c) level should be checked: ? At least 2 times a year, if you are meeting your treatment goals. ? 4 times a year, if you are not meeting treatment goals or if your treatment goals have changed.  Your blood lipids (lipid profile) should be checked yearly. You should also be checked yearly for protein in your urine (urine microalbumin).  If you have type 1 diabetes, get an eye exam 3-5 years after you  are diagnosed, and then once a year after your first exam.  If you have type 2 diabetes, get an eye exam as soon as you are diagnosed, and then once a year after your first exam.  Keep your vaccines current It is recommended that you receive:  A flu  (influenza) vaccine every year.  A pneumonia (pneumococcal) vaccine and a hepatitis B vaccine. If you are age 86 or older, you may get the pneumonia vaccine as a series of two separate shots.  Ask your health care provider which other vaccines may be recommended. Take care of your feet Diabetes may cause you to have poor blood circulation to your legs and feet. Because of this, taking care of your feet is very important. Diabetes can cause:  The skin on the feet to get thinner, break more easily, and heal more slowly.  Nerve damage in your legs and feet, which results in decreased feeling. You may not notice minor injuries that could lead to serious problems.  To avoid foot problems:  Check your skin and feet every day for cuts, bruises, redness, blisters, or sores.  Schedule a foot exam with your health care provider once every year. This exam includes: ? Inspecting of the structure and skin of your feet. ? Checking the pulses and sensation in your feet.  Make sure that your health care provider performs a visual foot exam at every medical visit.  Take care of your teeth People with poorly controlled diabetes are more likely to have gum (periodontal) disease. Diabetes can make periodontal diseases harder to control. If not treated, periodontal diseases can lead to tooth loss. To prevent this:  Brush your teeth twice a day.  Floss at least once a day.  Visit your dentist 2 times a year.  Drink responsibly Limit alcohol intake to no more than 1 drink a day for nonpregnant women and 2 drinks a day for men. One drink equals 12 oz of beer, 5 oz of wine, or 1 oz of hard liquor. It is important to eat food when you drink alcohol to avoid low blood glucose (hypoglycemia). Avoid alcohol if you:  Have a history of alcohol abuse or dependence.  Are pregnant.  Have liver disease, pancreatitis, advanced neuropathy, or severe hypertriglyceridemia.  Lessen stress Living with diabetes can  be stressful. When you are experiencing stress, your blood glucose may be affected in two ways:  Stress hormones may cause your blood glucose to rise.  You may be distracted from taking good care of yourself.  Be aware of your stress level and make changes to help you manage challenging situations. To lower your stress levels:  Consider joining a support group.  Do planned relaxation or meditation.  Do a hobby that you enjoy.  Maintain healthy relationships.  Exercise regularly.  Work with your health care provider or a mental health professional.  Summary  You can take action to prevent or slow down problems that are caused by diabetes (diabetes mellitus). Following your diabetes plan and taking care of yourself can reduce your risk of serious or life-threatening complications.  Follow instructions from your health care providers about managing your diabetes. Your diabetes may be managed by a team of health care providers who can teach you how to care for yourself and can answer questions that you have.  Your health care provider will tell you how often you need medical visits depending on your diabetes management plan. Keep all follow-up visits as directed. This is important  so possible problems can be identified early and complications can be avoided or treated. This information is not intended to replace advice given to you by your health care provider. Make sure you discuss any questions you have with your health care provider. Document Released: 03/13/2011 Document Revised: 03/24/2016 Document Reviewed: 03/24/2016 Elsevier Interactive Patient Education  2018 [employer].  Type 1 Diabetes Mellitus, Self Care, Adult Caring for yourself after you have been diagnosed with type 1 diabetes (type 1 diabetes mellitus) means keeping your blood sugar (glucose) under control with a balance of:  Insulin.  Nutrition.  Exercise.  Lifestyle changes.  Other medicines, if  necessary.  Support from your team of health care providers and others.  The following information explains what you need to know to manage your diabetes at home. What do I need to do to manage my blood glucose?  Check your blood glucose every day, as often as told by your health care provider.  Contact your health care provider if your blood glucose is above your target for 2 tests in a row.  Have your A1c (hemoglobin A1c) level checked at least two times a year, or as often as told by your health care provider. Your health care provider will set individualized treatment goals for you. Generally, the goal of treatment is to maintain the following blood glucose levels:  Before meals (preprandial): 80-130 mg/dL (4.4-7.2 mmol/L).  After meals (postprandial): below 180 mg/dL (10 mmol/L).  A1c level: less than 7%.  What do I need to know about hyperglycemia and hypoglycemia? What is hyperglycemia? Hyperglycemia, also called high blood glucose, occurs when blood glucose is too high.Make sure you know the early signs of hyperglycemia, such as:  Increased thirst.  Hunger.  Feeling very tired.  Needing to urinate more often than usual.  Blurry vision.  What is hypoglycemia? Hypoglycemia, also called low blood glucose, occurswith a blood glucose level at or below 70 mg/dL (3.9 mmol/L). The risk for hypoglycemia increases during or after exercise, during sleep, during illness, and when skipping meals or not eating for a long time (fasting). It is important to know the symptoms of hypoglycemia and treat it right away. Always have a 15-gram rapid-acting carbohydrate snack with you to treat low blood glucose. Family members and close friends should also know the symptoms and should understand how to treat hypoglycemia, in case you are not able to treat yourself. What are the symptoms of hypoglycemia? Hypoglycemia symptoms can include:  Hunger.  Anxiety.  Sweating and feeling  clammy.  Confusion.  Dizziness or feeling light-headed.  Sleepiness.  Nausea.  Increased heart rate.  Headache.  Blurry vision.  Seizure.  Nightmares.  Tingling or numbness around the mouth, lips, or tongue.  A change in speech.  Decreased ability to concentrate.  A change in coordination.  Restless sleep.  Tremors or shakes.  Fainting.  Irritability.  How do I treat hypoglycemia?  If you are alert and able to swallow safely, follow the 15:15 rule:  Take 15 grams of a rapid-acting carbohydrate. Rapid-acting options include: ? 1 tube of glucose gel. ? 3 glucose pills. ? 6-8 pieces of hard candy. ? 4 oz (120 mL) of fruit juice. ? 4 oz (120 mL) of regular (not diet) soda.  Check your blood glucose 15 minutes after you take the carbohydrate.  If the repeat blood glucose level is still at or below 70 mg/dL (3.9 mmol/L), take 15 grams of a carbohydrate again.  If your blood glucose level  does not increase above 70 mg/dL (3.9 mmol/L) after 3 tries, seek emergency medical care.  After your blood glucose returns to normal, eat a meal or a snack within 1 hour.  How do I treat severe hypoglycemia? Severe hypoglycemia is when your blood glucose level is at or below 54 mg/dL (3 mmol/L). Severe hypoglycemia is an emergency. Do not wait to see if the symptoms will go away. Get medical help right away. Call your local emergency services (911 in the [place] Do not drive yourself to the hospital. If you have severe hypoglycemia and you cannot eat or drink, you may need an injection of glucagon. A family member or close friend should learn how to check your blood glucose and how to give you a glucagon injection. Ask your health care provider if you need to have an emergency glucagon injection kit available. Severe hypoglycemia may need to be treated in a hospital. The treatment may include getting glucose through an IV tube. You may also need treatment for the cause of your  hypoglycemia. Can having diabetes put me at risk for other conditions? Having diabetes can put you at risk for other long-term (chronic) conditions, such as heart disease and kidney disease. Along with insulin, your health care provider may prescribe medicines to help prevent complications from diabetes. These medicines may include:  Aspirin.  Medicine to lower cholesterol.  Medicine to control blood pressure.  What else can I do to manage my diabetes? Take your diabetes medicines as told  Take your insulin and other diabetes medicines every day as told.  Do not run out of insulin or other diabetes medicines that you take. Plan ahead so you always have these available.  Adjust your insulin dosage based on how physically active you are and what foods you eat. Your health care provider will tell you how to adjust your dosage. Make healthy food choices  The things that you eat and drink affect your blood glucose and your insulin dosage. Making good choices helps to control your diabetes and prevent other health problems. A healthy meal plan includes eating lean proteins, complex carbohydrates, fresh fruits and vegetables, low-fat dairy products, and healthy fats. Make an appointment to see a diet and nutrition specialist (registered dietitian) to help you create an eating plan that is right for you. Make sure that you:  Follow instructions from your health care provider about eating or drinking restrictions.  Drink enough fluid to keep your urine clear or pale yellow.  Eat healthy snacks between nutritious meals.  Track the carbohydrates that you eat. Do this by reading food labels and learning the standard serving sizes of foods.  Follow your sick day plan whenever you cannot eat or drink as usual. Make this plan in advance with your health care provider.  Stay active  Exercise regularly, as told by your health care provider. This may include:  Stretching and doing strength  exercises, such as yoga or weightlifting, at least 2 times a week.  Doing at least 150 minutes of moderate-intensity or vigorous-intensity exercise each week. This could be brisk walking, biking, or water aerobics. ? Spread out your activity over at least 3 days of the week. ? Do not go more than 2 days in a row without doing some kind of physical activity.  When you start a new exercise or activity, work with your health care provider to adjust your insulin or food intake as needed. Make healthy lifestyle choices  Do not use any tobacco  products, such as cigarettes, chewing tobacco, and e-cigarettes. If you need help quitting, ask your health care provider.  If your health care provider says that alcohol is safe for you, limit alcohol intake to no more than 1 drink per day for nonpregnant women and 2 drinks per day for men. One drink equals 12 oz of beer, 5 oz of wine, or 1 oz of hard liquor.  Learn to manage stress. If you need help with this, ask your health care provider. Care for your body   Keep your immunizations up to date. In addition to getting vaccinations as told by your health care provider, it is recommended that you get vaccinated against the following illnesses: ? The flu (influenza). Get a flu shot every year. ? Pneumonia. ? Hepatitis B.  Schedule an eye exam within 3-5 years after your diagnosis, and then one time every year after that.  Check your skin and feet every day for cuts, bruises, redness, blisters, or sores. Schedule a foot exam with your health care provider 3-5 years after your diagnosis, and then every year after the first exam.  Brush your teeth and gums two times a day, and floss at least one time a day. Visit your dentist at least once every 6 months.  Maintain a healthy weight. General instructions   Take over-the-counter and prescription medicines only as told by your health care provider.  Share your diabetes management plan with people in your  workplace, school, and household.  Check your urine for ketones when you are ill and as told by your health care provider.  Ask your health care provider: ? Do I need to meet with a diabetes educator? ? Where can I find a support group for people with diabetes?  Carry a medical alert card or wear medical alert jewelry.  Keep all follow-up visits as told by your health care provider. This is important. Where to find more information: For more information about diabetes, visit:  American Diabetes Association (ADA): www.diabetes.org  American Association of Diabetes Educators (AADE): www.diabeteseducator.org/patient-resources  This information is not intended to replace advice given to you by your health care provider. Make sure you discuss any questions you have with your health care provider. Document Released: 10/17/2015 Document Revised: 12/01/2015 Document Reviewed: 07/29/2015 Elsevier Interactive Patient Education  [employer].

## 2017-08-05 NOTE — Progress Notes (Signed)
Assessment & Plan:  [name] was seen today for diabetes.  Diagnoses and all orders for this visit:  DM (diabetes mellitus), type 1 with complications (HCC) -     Glucose (CBG) -     HgB A1c -     glucose blood (TRUE METRIX BLOOD GLUCOSE TEST) test strip; Use as instructed  Diabetes is poorly controlled. Advised patient to keep a fasting blood sugar log fast, 2 hours post lunch and bedtime which will be reviewed at the next office visit.  NEEDS OPHTHALMOLOGY screening exam but does not have insurance.  Type 1 diabetes mellitus with hyperlipidemia (HCC) -     simvastatin (ZOCOR) 20 MG tablet; Take 1 tablet (20 mg total) by mouth daily. Work on a low fat, heart healthy diet and participate in regular aerobic exercise program to control as well by working out at least 150 minutes per week. No fried foods. No junk foods, sodas, sugary drinks, unhealthy snacking, or smoking.   Seizure disorder (HCC) -     divalproex (DEPAKOTE ER) 500 MG 24 hr tablet; Take 3 tablets (1,500 mg total) by mouth daily. TAKE YOUR DEPAKOTE AS PRESCRIBEDL ;DO NOT SKIP DOSES  Essential hypertension, benign -     amLODipine (NORVASC) 5 MG tablet; Take 1 tablet (5 mg total) by mouth daily. -     lisinopril (PRINIVIL,ZESTRIL) 20 MG tablet; Take 1 tablet (20 mg total) by mouth every morning. Continue all antihypertensives as prescribed.  Remember to bring in your blood pressure log with you for your follow up appointment.  DASH/Mediterranean Diets are healthier choices for HTN.     Patient has been counseled on age-appropriate routine health concerns for screening and prevention. These are reviewed and up-to-date. Referrals have been placed accordingly. Immunizations are up-to-date or declined.    Subjective:   Chief Complaint  Patient presents with  . Diabetes    Patient is here for a follow-up.    HPI [name] 42 y.o. male presents to office today for DM f/u. He does not have insurance. I have strongly  urged him to speak with our financial representative so that he can be referred to Endocrionology.   Diabetes Mellitus Type 1 Checking blood sugars sporadically with average 160s per his report however his A1c reflects higher glucose average. He is noncompliant with Novolog. Reports he is not home during the day most of the time so he does not check his blood sugars nor does he eat meals TID. He did not bring his glucometer today. A1c has consistently been 9-10 over the past year.  He endorses compliance with his additional oral glucose agents and lantus. He does endorse having to make his medications "stretch" due to his finances. Denies any hypoglycemic symptoms. He has a book bag with him today with several very unhealthy snacks.  Lab Results  Component Value Date   HGBA1C 9.6 08/05/2017    Essential Hypertension Normotensive. He is medication compliant. Denies chest pain, shortness of breath, palpitations, lightheadedness, dizziness, headaches or BLE edema. He does not check his blood pressure at home.  BP Readings from Last 3 Encounters:  08/05/17 133/81  06/10/17 118/71  05/11/17 128/69    Hyperlipidemia Takes zocor as prescribed. Denies statin intolerance or myalgias.  Seizure Disorder His last valproate acid level was low. I instructed him to make sure he is compliant with taking his depakote. He has been skipping doses per his report. He is aware that skipping doses increases his risk of increased  seizure activity. He denies any recent seizure activity.  Lab Results  Component Value Date   VALPROATE 18 (L) 06/10/2017   Review of Systems  Constitutional: Negative for fever, malaise/fatigue and weight loss.  HENT: Negative.  Negative for nosebleeds.   Eyes: Negative.  Negative for blurred vision, double vision and photophobia.  Respiratory: Negative.  Negative for cough and shortness of breath.   Cardiovascular: Negative.  Negative for chest pain, palpitations and leg swelling.    Gastrointestinal: Negative.  Negative for abdominal pain, constipation, diarrhea, heartburn, nausea and vomiting.  Musculoskeletal: Negative.  Negative for myalgias.  Neurological: Negative.  Negative for dizziness, focal weakness, seizures and headaches.  Endo/Heme/Allergies: Negative for environmental allergies.  Psychiatric/Behavioral: Positive for depression. Negative for suicidal ideas. The patient has insomnia.     Past Medical History:  Diagnosis Date  . Diabetes mellitus   . Hypertension   . Seizures (HCC)     Past Surgical History:  Procedure Laterality Date  . HERNIA REPAIR      Family History  Problem Relation Age of Onset  . Hypertension Mother   . Diabetes Paternal Uncle     Social History Reviewed with no changes to be made today.   Outpatient Medications Prior to Visit  Medication Sig Dispense Refill  . aspirin 81 MG tablet Take 81 mg by mouth daily.    . insulin aspart (NOVOLOG) 100 UNIT/ML injection Inject 1-4 Units into the skin 3 (three) times daily before meals. If sugar > 120 30 mL 3  . Insulin Glargine (LANTUS) 100 UNIT/ML Solostar Pen Inject 25 Units into the skin daily. May increase Lantus by 2 units every 3 days until fasting blood sugar is 100-130. 5 pen 3  . Insulin Pen Needle (B-D UF III MINI PEN NEEDLES) 31G X 5 MM MISC Insert into the skin as prescribed 90 each 1  . Insulin Syringe-Needle U-100 (B-D INS SYRINGE 0.5CC/31GX5/16) 31G X 5/16" 0.5 ML MISC 1 each by Does not apply route 3 (three) times daily. 90 each 11  . Omega-3 Fatty Acids (FISH OIL PO) Take 1 capsule by mouth daily.    . ondansetron (ZOFRAN) 4 MG tablet Take 1 tablet (4 mg total) by mouth every 8 (eight) hours as needed for nausea or vomiting. (Patient not taking: Reported on 02/07/2017) 20 tablet 0  . risperiDONE (RISPERDAL) 0.5 MG tablet Take 1 tablet (0.5 mg total) by mouth daily. 30 tablet 0  . TRUEPLUS LANCETS 28G MISC 1 each by Does not apply route 3 (three) times daily. 100 each  11  . amLODipine (NORVASC) 5 MG tablet Take 1 tablet (5 mg total) by mouth daily. 90 tablet 3  . divalproex (DEPAKOTE ER) 500 MG 24 hr tablet Take 3 tablets (1,500 mg total) by mouth daily. 90 tablet 0  . glucose blood (CONTOUR NEXT TEST) test strip Use as instructed 5-6x daily 200 each 2  . lisinopril (PRINIVIL,ZESTRIL) 20 MG tablet Take 1 tablet (20 mg total) by mouth every morning. 90 tablet 3  . simvastatin (ZOCOR) 20 MG tablet Take 1 tablet (20 mg total) by mouth daily. 90 tablet 3   No facility-administered medications prior to visit.     Allergies  Allergen Reactions  . Ziprasidone Mesylate Other (See Comments)    "severe muscle spasms"       Objective:    BP 133/81 (BP Location: Right Arm, Patient Position: Sitting, Cuff Size: Large)   Pulse 63   Temp 97.9 F (36.6 C) (Oral)  Resp 12   Wt 172 lb (78 kg)   SpO2 100%   BMI 23.33 kg/m  Wt Readings from Last 3 Encounters:  08/05/17 172 lb (78 kg)  06/10/17 172 lb (78 kg)  05/08/17 177 lb (80.3 kg)    Physical Exam  Constitutional: He is oriented to person, place, and time. He appears well-developed and well-nourished. He is cooperative.  HENT:  Head: Normocephalic and atraumatic.  Eyes: EOM are normal.  Neck: Normal range of motion.  Cardiovascular: Normal rate, regular rhythm and normal heart sounds. Exam reveals no gallop and no friction rub.  No murmur heard. Pulmonary/Chest: Effort normal and breath sounds normal. No tachypnea. No respiratory distress. He has no decreased breath sounds. He has no wheezes. He has no rhonchi. He has no rales. He exhibits no tenderness.  Abdominal: Soft. Bowel sounds are normal.  Musculoskeletal: Normal range of motion. He exhibits no edema.  Neurological: He is alert and oriented to person, place, and time. Coordination normal.  Skin: Skin is warm and dry.  Psychiatric: He has a normal mood and affect. His speech is normal and behavior is normal. Judgment and thought content  normal. Cognition and memory are normal. He expresses no homicidal and no suicidal ideation. He expresses no suicidal plans and no homicidal plans.  Nursing note and vitals reviewed.      Patient has been counseled extensively about nutrition and exercise as well as the importance of adherence with medications and regular follow-up. The patient was given clear instructions to go to ER or return to medical center if symptoms don't improve, worsen or new problems develop. The patient verbalized understanding.   Follow-up: Return in about 3 months (around 11/03/2017) for HTN/HPL/DM, Needs appointment with financial representative.Claiborne Rigg, FNP-BC [facility] [place], [place] [phone]   08/08/2017, 12:50 AM

## 2017-09-06 MED FILL — !LANTUS SOLOSTAR 100UNITS/M: 100 | 30 days supply | Qty: 9 | Fill #2

## 2017-09-06 MED FILL — !NOVOLOG 100UNITS/ML VIAL: 100/ML | 28 days supply | Qty: 10 | Fill #1

## 2017-10-22 MED FILL — !LANTUS SOLOSTAR 100UNITS/M: 100 | 30 days supply | Qty: 9 | Fill #3

## 2017-10-22 MED FILL — LISINOPRIL 20 MG TAB: 20 | 30 days supply | Qty: 30 | Fill #1

## 2017-10-22 MED FILL — ?AMLODIPINE BESYLATE 5 MG T: 5 MG | 30 days supply | Qty: 30 | Fill #1

## 2017-10-22 MED FILL — SIMVASTATIN 20 MG TABLET: 20 | 30 days supply | Qty: 30 | Fill #1

## 2017-11-04 ENCOUNTER — Ambulatory Visit: Payer: Self-pay | Admitting: Nurse Practitioner

## 2017-11-30 ENCOUNTER — Encounter (HOSPITAL_COMMUNITY): Payer: Self-pay

## 2017-11-30 ENCOUNTER — Ambulatory Visit (HOSPITAL_COMMUNITY)
Admission: EM | Admit: 2017-11-30 | Discharge: 2017-11-30 | Disposition: A | Discharge status: Home / Self Care | Payer: Self-pay | Attending: Emergency Medicine | Admitting: Emergency Medicine

## 2017-11-30 ENCOUNTER — Other Ambulatory Visit: Payer: Self-pay

## 2017-11-30 DIAGNOSIS — Z760 Encounter for issue of repeat prescription: Secondary | ICD-10-CM

## 2017-11-30 DIAGNOSIS — G40909 Epilepsy, unspecified, not intractable, without status epilepticus: Secondary | ICD-10-CM

## 2017-11-30 MED ORDER — DIVALPROEX SODIUM ER 500 MG PO TB24: 1500.0000 mg | ORAL_TABLET | Freq: Every day | ORAL | 0 refills | Status: DC

## 2017-11-30 NOTE — Discharge Instructions (Addendum)
Follow up with [facility] asap.  Take the full 1500 mg for next few days.

## 2017-11-30 NOTE — ED Provider Notes (Signed)
[facility]    CSN: [id number] Arrival date & time: 11/30/17  1527     History   Chief Complaint Chief Complaint  Patient presents with  . Medication Refill    HPI [name] is a 42 y.o. male.   States he is currently getting established with [facility] and has seen them once or twice.  Patient states that he has run out of his Depakote for seizures.  Patient states he has not taken any since Wednesday.  Patient here specifically for medication refill  HPI  Past Medical History:  Diagnosis Date  . Diabetes mellitus   . Hypertension   . Seizures [facility])     Patient Active Problem List   Diagnosis Date Noted  . 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 Knee pain 03/16/2016  . Scrotal pain 01/13/2016  . Seizure disorder (HCC) 03/10/2015  . 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 UNSPECIFIED VITAMIN D DEFICIENCY 06/16/2009  . PTSD (post-traumatic stress disorder) 03/26/2008  . Essential hypertension, benign 03/24/2008  . SLEEP DISORDER 03/24/2008  . SLEEP DEPRIVATION 02/09/2008  . DM (diabetes mellitus), type 1 with complications (HCC) 04/04/2007    Past Surgical History:  Procedure Laterality Date  . HERNIA REPAIR         Home Medications    Prior to Admission medications   Medication Sig Start Date End Date Taking? Authorizing Provider  amLODipine (NORVASC) 5 MG tablet Take 1 tablet (5 mg total) by mouth daily. 08/05/17  Yes Claiborne Rigg, NP  aspirin 81 MG tablet Take 81 mg by mouth daily.   Yes [provider]  glucose blood (TRUE METRIX BLOOD GLUCOSE TEST) test strip Use as instructed 08/05/17  Yes Claiborne Rigg, NP  insulin aspart (NOVOLOG) 100 UNIT/ML injection Inject 1-4 Units into the skin 3 (three) times daily before meals. If sugar > 120 06/10/17  Yes Claiborne Rigg, NP  Insulin Pen Needle (B-D UF III MINI PEN  NEEDLES) 31G X 5 MM MISC Insert into the skin as prescribed 06/10/17  Yes Claiborne Rigg, NP  Insulin Syringe-Needle U-100 (B-D INS SYRINGE 0.5CC/31GX5/16) 31G X 5/16" 0.5 ML MISC 1 each by Does not apply route 3 (three) times daily. 03/05/16  Yes Funches, Josalyn, MD  lisinopril (PRINIVIL,ZESTRIL) 20 MG tablet Take 1 tablet (20 mg total) by mouth every morning. 08/05/17  Yes Claiborne Rigg, NP  Omega-3 Fatty Acids (FISH OIL PO) Take 1 capsule by mouth daily.   Yes [provider]  ondansetron (ZOFRAN) 4 MG tablet Take 1 tablet (4 mg total) by mouth every 8 (eight) hours as needed for nausea or vomiting. 10/02/16  Yes Newlin, Enobong, MD  risperiDONE (RISPERDAL) 0.5 MG tablet Take 1 tablet (0.5 mg total) by mouth daily. 05/11/17  Yes Charm Rings, NP  simvastatin (ZOCOR) 20 MG tablet Take 1 tablet (20 mg total) by mouth daily. 08/05/17  Yes Claiborne Rigg, NP  TRUEPLUS LANCETS 28G MISC 1 each by Does not apply route 3 (three) times daily. 03/10/15  Yes Funches, Josalyn, MD  divalproex (DEPAKOTE ER) 500 MG 24 hr tablet Take 3 tablets (1,500 mg total) by mouth daily. 11/30/17   Servando Salina, NP  Insulin Glargine (LANTUS) 100 UNIT/ML Solostar Pen Inject 25 Units into the skin daily. May increase Lantus by 2 units every 3 days until fasting blood sugar is 100-130. 06/10/17  07/10/17  Claiborne Rigg, NP    Family History Family History  Problem Relation Age of Onset  . Hypertension Mother   . Diabetes Paternal Uncle     Social History Social History   Tobacco Use  . Smoking status: Never Smoker  . Smokeless tobacco: Never Used  Substance Use Topics  . Alcohol use: Yes    Alcohol/week: 0.6 oz    Types: 1 Glasses of wine per week    Comment: rarely  . Drug use: No     Allergies   Ziprasidone mesylate   Review of Systems Review of Systems  Constitutional: Negative.  Negative for fatigue and fever.  HENT: Negative.   Eyes: Negative.   Respiratory: Negative.  Negative  for cough and shortness of breath.   Cardiovascular: Negative.   Gastrointestinal: Negative.  Negative for diarrhea, nausea and vomiting.  Endocrine: Negative.   Genitourinary: Negative.   Musculoskeletal: Negative.   Skin: Negative.   Allergic/Immunologic: Negative.   Neurological: Negative.  Negative for seizures and headaches.  Hematological: Negative.   Psychiatric/Behavioral: Negative.      Physical Exam Triage Vital Signs ED Triage Vitals  Enc Vitals Group     BP 11/30/17 1631 (!) 153/79     Pulse Rate 11/30/17 1631 (!) 59     Resp 11/30/17 1631 16     Temp 11/30/17 1631 97.9 F (36.6 C)     Temp Source 11/30/17 1631 Oral     SpO2 11/30/17 1631 100 %     Weight --      Height --      Head Circumference --      Peak Flow --      Pain Score 11/30/17 1633 0     Pain Loc --      Pain Edu? --      Excl. in GC? --    No data found.  Updated Vital Signs BP (!) 153/79 (BP Location: Left Arm)   Pulse (!) 59   Temp 97.9 F (36.6 C) (Oral)   Resp 16   SpO2 100%   Visual Acuity Right Eye Distance:   Left Eye Distance:   Bilateral Distance:    Right Eye Near:   Left Eye Near:    Bilateral Near:     Physical Exam  Constitutional: He appears well-developed and well-nourished. No distress.  Cardiovascular: Normal rate, regular rhythm, normal heart sounds and intact distal pulses. Exam reveals no gallop and no friction rub.  No murmur heard. Pulmonary/Chest: Effort normal and breath sounds normal. No stridor. No respiratory distress. He has no wheezes. He has no rales. He exhibits no tenderness.  Skin: Skin is warm and dry. He is not diaphoretic.  Nursing note and vitals reviewed.    UC Treatments / Results  Labs (all labs ordered are listed, but only abnormal results are displayed) Labs Reviewed - No data to display  EKG None  Radiology No results found.  Procedures Procedures (including critical care time)  Medications Ordered in UC Medications -  No data to display  Initial Impression / Assessment and Plan / UC Course  I have reviewed the triage vital signs and the nursing notes.  Pertinent labs & imaging results that were available during my care of the patient were reviewed by me and considered in my medical decision making (see chart for details).     Discussed plan of care with Dr. Milus Glazier.  Patient states he normally only takes 1000 mg and it  controls his seizures, but that he is supposed to take .  Advised he should take the full 1500 for at least the next several days.  Final Clinical Impressions(s) / UC Diagnoses   Final diagnoses:  Medication refill     Discharge Instructions     Follow up with [facility] asap.  Take the full 1500 mg for next few days.     ED Prescriptions    Medication Sig Dispense Auth. Provider   divalproex (DEPAKOTE ER) 500 MG 24 hr tablet Take 3 tablets (1,500 mg total) by mouth daily. 90 tablet Servando Salina, NP     Controlled Substance Prescriptions [place] Controlled Substance Registry consulted? Not Applicable   The usual and customary discharge instructions and warnings were given.  The patient verbalizes understanding and agrees to plan of care.      Servando Salina, NP 11/30/17 1719

## 2017-11-30 NOTE — ED Triage Notes (Signed)
Pt presents today needing a refill of his seizure medication divalproex. Tried calling his PCP office and was told to wait until Tuesday. Pt is afraid to wait on this medication.

## 2017-12-10 MED FILL — AMLODIPINE BESYLATE 5 MG TA: 5 | 30 days supply | Qty: 30 | Fill #2

## 2017-12-10 MED FILL — !NOVOLOG 100UNITS/ML VIAL: 100/ML | 28 days supply | Qty: 10 | Fill #2

## 2017-12-10 MED FILL — LISINOPRIL 20 MG TAB: 20 | 30 days supply | Qty: 30 | Fill #2

## 2017-12-10 MED FILL — SIMVASTATIN 20 MG TABLET: 20 | 30 days supply | Qty: 30 | Fill #2

## 2017-12-10 MED FILL — !LANTUS SOLOSTAR 100UNITS/M: 100 | 30 days supply | Qty: 9 | Fill #4

## 2018-01-02 MED FILL — AMLODIPINE BESYLATE 5 MG TA: 5 | 30 days supply | Qty: 30 | Fill #3

## 2018-01-02 MED FILL — DIVALPROEX SOD ER 500 MG TA: 500 | 30 days supply | Qty: 90 | Fill #1

## 2018-01-07 ENCOUNTER — Ambulatory Visit: Payer: Self-pay | Admitting: Nurse Practitioner

## 2018-01-25 ENCOUNTER — Ambulatory Visit (HOSPITAL_COMMUNITY)
Admission: EM | Admit: 2018-01-25 | Discharge: 2018-01-25 | Disposition: A | Discharge status: Home / Self Care | Payer: Self-pay | Attending: Family Medicine | Admitting: Family Medicine

## 2018-01-25 ENCOUNTER — Encounter (HOSPITAL_COMMUNITY): Payer: Self-pay | Admitting: Emergency Medicine

## 2018-01-25 ENCOUNTER — Encounter (HOSPITAL_COMMUNITY): Payer: Self-pay

## 2018-01-25 ENCOUNTER — Other Ambulatory Visit: Payer: Self-pay

## 2018-01-25 ENCOUNTER — Emergency Department (HOSPITAL_COMMUNITY)
Admission: EM | Admit: 2018-01-25 | Discharge: 2018-01-26 | Disposition: A | Discharge status: Home / Self Care | Payer: Self-pay | Attending: Emergency Medicine | Admitting: Emergency Medicine

## 2018-01-25 DIAGNOSIS — IMO0001 Reserved for inherently not codable concepts without codable children: Secondary | ICD-10-CM

## 2018-01-25 DIAGNOSIS — E119 Type 2 diabetes mellitus without complications: Secondary | ICD-10-CM

## 2018-01-25 DIAGNOSIS — R0789 Other chest pain: Secondary | ICD-10-CM

## 2018-01-25 DIAGNOSIS — R631 Polydipsia: Secondary | ICD-10-CM | POA: Insufficient documentation

## 2018-01-25 DIAGNOSIS — R358 Other polyuria: Secondary | ICD-10-CM

## 2018-01-25 DIAGNOSIS — R350 Frequency of micturition: Secondary | ICD-10-CM | POA: Insufficient documentation

## 2018-01-25 DIAGNOSIS — R3589 Other polyuria: Secondary | ICD-10-CM

## 2018-01-25 DIAGNOSIS — E10649 Type 1 diabetes mellitus with hypoglycemia without coma: Secondary | ICD-10-CM | POA: Insufficient documentation

## 2018-01-25 DIAGNOSIS — Z760 Encounter for issue of repeat prescription: Secondary | ICD-10-CM

## 2018-01-25 DIAGNOSIS — E108 Type 1 diabetes mellitus with unspecified complications: Secondary | ICD-10-CM

## 2018-01-25 DIAGNOSIS — R809 Proteinuria, unspecified: Secondary | ICD-10-CM

## 2018-01-25 DIAGNOSIS — E162 Hypoglycemia, unspecified: Secondary | ICD-10-CM

## 2018-01-25 DIAGNOSIS — Z794 Long term (current) use of insulin: Secondary | ICD-10-CM

## 2018-01-25 DIAGNOSIS — I10 Essential (primary) hypertension: Secondary | ICD-10-CM | POA: Insufficient documentation

## 2018-01-25 LAB — BASIC METABOLIC PANEL
ANION GAP: 9 (ref 5–15)
BUN: 18 mg/dL (ref 6–20)
CALCIUM: 9.4 mg/dL (ref 8.9–10.3)
CO2: 29 mmol/L (ref 22–32)
Chloride: 104 mmol/L (ref 98–111)
Creatinine, Ser: 1.54 mg/dL — ABNORMAL HIGH (ref 0.61–1.24)
GFR, EST NON AFRICAN AMERICAN: 55 mL/min — AB (ref >60)
GLUCOSE: 63 mg/dL — AB (ref 70–99)
Potassium: 4.5 mmol/L (ref 3.5–5.1)
Sodium: 142 mmol/L (ref 135–145)

## 2018-01-25 LAB — CBG MONITORING, ED
GLUCOSE-CAPILLARY: 264 mg/dL — AB (ref 70–99)
GLUCOSE-CAPILLARY: 45 mg/dL — AB (ref 70–99)
Glucose-Capillary: 236 mg/dL — ABNORMAL HIGH (ref 70–99)

## 2018-01-25 LAB — CBC
HCT: 40.8 % (ref 39.0–52.0)
HEMOGLOBIN: 13.1 g/dL (ref 13.0–17.0)
MCH: 28.8 pg (ref 26.0–34.0)
MCHC: 32.1 g/dL (ref 30.0–36.0)
MCV: 89.7 fL (ref 78.0–100.0)
Platelets: 198 10*3/uL (ref 150–400)
RBC: 4.55 MIL/uL (ref 4.22–5.81)
RDW: 13.1 % (ref 11.5–15.5)
WBC: 6.5 10*3/uL (ref 4.0–10.5)

## 2018-01-25 LAB — URINALYSIS, ROUTINE W REFLEX MICROSCOPIC
Bilirubin Urine: NEGATIVE
Glucose, UA: NEGATIVE mg/dL
Hgb urine dipstick: NEGATIVE
Ketones, ur: NEGATIVE mg/dL
Leukocytes, UA: NEGATIVE
Nitrite: NEGATIVE
Protein, ur: 100 mg/dL — AB
SPECIFIC GRAVITY, URINE: 1.016 (ref 1.005–1.030)
pH: 6 (ref 5.0–8.0)

## 2018-01-25 MED ORDER — ALBUTEROL SULFATE HFA 108 (90 BASE) MCG/ACT IN AERS: 1.0000 | INHALATION_SPRAY | Freq: Four times a day (QID) | RESPIRATORY_TRACT | 0 refills | Status: DC | PRN

## 2018-01-25 MED ORDER — INSULIN GLARGINE 100 UNIT/ML SOLOSTAR PEN: 25.0000 [IU] | PEN_INJECTOR | Freq: Every day | SUBCUTANEOUS | 0 refills | Status: DC

## 2018-01-25 MED ORDER — SODIUM CHLORIDE 0.9 % IV BOLUS
1000.0000 mL | Freq: Once | INTRAVENOUS | Status: AC
  Administered 2018-01-26: 1000 mL via INTRAVENOUS

## 2018-01-25 MED ORDER — INSULIN ASPART 100 UNIT/ML ~~LOC~~ SOLN: 1.0000 [IU] | Freq: Three times a day (TID) | SUBCUTANEOUS | 0 refills | Status: DC

## 2018-01-25 NOTE — ED Triage Notes (Signed)
Pt states he was suppose to see hsi MD this week for insulin refill but did not see provider.  [facility] did [facility] script but was told it would be [phone] dollars. Pt did not receive insulin.  Pt now feels fatigued d/t unstable BS, increase thirst and urination.

## 2018-01-25 NOTE — Discharge Instructions (Signed)
Try the inhaler when this happens or 10-15 minutes before you are exposed to temperature changes.  I do not think this is related to your heart.   Follow up with your PCP if no improvement.

## 2018-01-25 NOTE — ED Provider Notes (Signed)
  [facility]    CSN: [id number] Arrival date & time: 01/25/18  1117  Chief Complaint  Patient presents with  . Medication Refill    [name] is a 42 y.o. male here for evaluation of central chest pain.  Duration of issue: 3 months Quality: dull Palliation: None Provocation: Going from warm to cold environments Severity: 6/10 Radiation: None Duration of chest pain: 3 hours Associated symptoms: None Cardiac history: Pt has DM, HTN Family heart history: no CAD Smoker? No   Pt has a hx of DM. Had forgotten to refill insulin, requesting refills today.  ROS:  Cardiac: No current chest pain Lungs: No SOB  Past Medical History:  Diagnosis Date  . Diabetes mellitus   . Hypertension   . Seizures (HCC)    Family History  Problem Relation Age of Onset  . Hypertension Mother   . Diabetes Paternal Uncle      BP 128/81 (BP Location: Left Arm)   Pulse 69   Temp 97.8 F (36.6 C) (Oral)   Resp 17   SpO2 98%  Gen: awake, alert, appears stated age HEENT: PERRLA, MMM Neck: No masses or asymmetry Heart: RRR, no bruits, no LE edema Lungs: CTAB, no accessory muscle use Abd: Soft, NT, ND, no masses or organomegaly MSK: chest pain is not reproducible to palptation Psych: Age appropriate judgment and insight, nml mood and affect  Atypical chest pain  Medication refill  DM (diabetes mellitus), type 1 with complications (HCC) - Plan: Insulin Glargine (LANTUS) 100 UNIT/ML Solostar Pen, insulin aspart (NOVOLOG) 100 UNIT/ML injection  Will trial SABA for #1. Could be bronchospasm induced by cold. Does not sound cardiac. Pt to f/u with PCP regarding this if it does not improve. Refill insulin.  F/u w pcp as originally scheduled. The patient voiced understanding and agreement to the plan.    Sharlene DoryWendling, Nicholas Paul, DO 01/25/18 1427

## 2018-01-25 NOTE — ED Provider Notes (Signed)
[facility] EMERGENCY DEPARTMENT Provider Note   CSN: [id number] Arrival date & time: 01/25/18  2022     History   Chief Complaint Chief Complaint  Patient presents with  . Medication Refill    HPI [name] is a 42 y.o. male with a hx of IDDM, HTN, seizures presents to the Emergency Department complaining of gradual, persistent, progressively worsening blood sugar fluctuations onset over the last few days.  Pt reports he ran out of his Lantus yesterday (took 12 units was his last dose - normal dose is 24 U).  Pt has been using novolog alone since that time.  Pt reports 12u this morning as his first dose and then 4-5 units every 2-3 hours since that time.  Pt reports he was not checking his blood sugars before each Novolog administration.  Upon arrival, pt CBG 45.  He was given crackers with improvement.  Pt reports 1 mo of associated polyuria and polydipsia.  Pt reports in the last month he had not been regularly checking blood sugar or eating like he should.  Pt reports this is due to a new job.  Pt reports he was seen at an urgent care yesterday for refill of the Lantus, which he was given, but he was unable to afford the medication at the [facility] pharmacy.  Pt reports he is followed by the [facility] who helps him afford his medication but he missed his appointment 2 weeks ago which is why he ran out and then attempted to fill the medication at another pharmacy.  No known aggravating or alleviating factors.  Pt denies fever, chills, headache, neck pain, chest pain, abd pain, N/V/D, weakness, dizziness, syncope, dysuria.     The history is provided by the patient and medical records. No language interpreter was used.    Past Medical History:  Diagnosis Date  . Diabetes mellitus   . Hypertension   . Seizures [facility])     Patient Active Problem List   Diagnosis Date Noted  . 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 Knee pain  03/16/2016  . Scrotal pain 01/13/2016  . Seizure disorder (HCC) 03/10/2015  . 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 UNSPECIFIED VITAMIN D DEFICIENCY 06/16/2009  . PTSD (post-traumatic stress disorder) 03/26/2008  . Essential hypertension, benign 03/24/2008  . SLEEP DISORDER 03/24/2008  . SLEEP DEPRIVATION 02/09/2008  . DM (diabetes mellitus), type 1 with complications (HCC) 04/04/2007    Past Surgical History:  Procedure Laterality Date  . HERNIA REPAIR          Home Medications    Prior to Admission medications   Medication Sig Start Date End Date Taking? Authorizing Provider  albuterol (PROVENTIL HFA;VENTOLIN HFA) 108 (90 Base) MCG/ACT inhaler Inhale 1-2 puffs into the lungs every 6 (six) hours as needed for wheezing or shortness of breath. 01/25/18   Wendling, Jilda Roche, DO  amLODipine (NORVASC) 5 MG tablet Take 1 tablet (5 mg total) by mouth daily. 08/05/17   Claiborne Rigg, NP  aspirin 81 MG tablet Take 81 mg by mouth daily.    [provider]  divalproex (DEPAKOTE ER) 500 MG 24 hr tablet Take 3 tablets (1,500 mg total) by mouth daily. 11/30/17   Servando Salina, NP  glucose blood (TRUE METRIX BLOOD GLUCOSE TEST) test strip Use as instructed 08/05/17   Claiborne Rigg, NP  insulin aspart (NOVOLOG) 100  UNIT/ML injection Inject 1-4 Units into the skin 3 (three) times daily before meals. If sugar > 120 01/25/18   Wendling, Jilda RocheNicholas Paul, DO  Insulin Glargine (LANTUS) 100 UNIT/ML Solostar Pen Inject 25 Units into the skin daily. May increase Lantus by 2 units every 3 days until fasting blood sugar is 100-130. 01/25/18 02/24/18  Wendling, Jilda RocheNicholas Paul, DO  Insulin Pen Needle (B-D UF III MINI PEN NEEDLES) 31G X 5 MM MISC Insert into the skin as prescribed 06/10/17   Claiborne RiggFleming, Zelda W, NP  Insulin Syringe-Needle U-100 (B-D INS SYRINGE 0.5CC/31GX5/16) 31G X 5/16" 0.5 ML MISC 1 each by Does not apply route  3 (three) times daily. 03/05/16   Funches, Gerilyn NestleJosalyn, MD  lisinopril (PRINIVIL,ZESTRIL) 20 MG tablet Take 1 tablet (20 mg total) by mouth every morning. 08/05/17   Claiborne RiggFleming, Zelda W, NP  Omega-3 Fatty Acids (FISH OIL PO) Take 1 capsule by mouth daily.    [provider]  risperiDONE (RISPERDAL) 0.5 MG tablet Take 1 tablet (0.5 mg total) by mouth daily. 05/11/17   Charm RingsLord, Jamison Y, NP  simvastatin (ZOCOR) 20 MG tablet Take 1 tablet (20 mg total) by mouth daily. 08/05/17   Claiborne RiggFleming, Zelda W, NP  TRUEPLUS LANCETS 28G MISC 1 each by Does not apply route 3 (three) times daily. 03/10/15   Dessa PhiFunches, Josalyn, MD    Family History Family History  Problem Relation Age of Onset  . Hypertension Mother   . Diabetes Paternal Uncle     Social History Social History   Tobacco Use  . Smoking status: Never Smoker  . Smokeless tobacco: Never Used  Substance Use Topics  . Alcohol use: Yes    Alcohol/week: 0.6 oz    Types: 1 Glasses of wine per week    Comment: rarely  . Drug use: No     Allergies   Ziprasidone mesylate   Review of Systems Review of Systems  Constitutional: Negative for appetite change, diaphoresis, fatigue, fever and unexpected weight change.  HENT: Negative for mouth sores.   Eyes: Negative for visual disturbance.  Respiratory: Negative for cough, chest tightness, shortness of breath and wheezing.   Cardiovascular: Negative for chest pain.  Gastrointestinal: Negative for abdominal pain, constipation, diarrhea, nausea and vomiting.  Endocrine: Positive for polydipsia and polyuria. Negative for polyphagia.  Genitourinary: Negative for dysuria, frequency, hematuria and urgency.  Musculoskeletal: Negative for back pain and neck stiffness.  Skin: Negative for rash.  Allergic/Immunologic: Negative for immunocompromised state.  Neurological: Negative for syncope, light-headedness and headaches.  Hematological: Does not bruise/bleed easily.  Psychiatric/Behavioral: Negative for  sleep disturbance. The patient is not nervous/anxious.      Physical Exam Updated Vital Signs BP (!) 152/85 (BP Location: Right Arm)   Pulse 63   Temp 98 F (36.7 C) (Oral)   Resp 16   Ht 5\' 11"  (1.803 m)   Wt 77.1 kg (170 lb)   SpO2 100%   BMI 23.71 kg/m   Physical Exam  Constitutional: He appears well-developed and well-nourished. No distress.  Awake, alert, nontoxic appearance  HENT:  Head: Normocephalic and atraumatic.  Mouth/Throat: Oropharynx is clear and moist. No oropharyngeal exudate.  Eyes: Conjunctivae are normal. No scleral icterus.  Neck: Normal range of motion. Neck supple.  Cardiovascular: Normal rate, regular rhythm and intact distal pulses.  Pulmonary/Chest: Effort normal and breath sounds normal. No respiratory distress. He has no wheezes.  Equal chest expansion  Abdominal: Soft. Bowel sounds are normal. He exhibits no mass. There is no  tenderness. There is no rebound and no guarding.  Musculoskeletal: Normal range of motion. He exhibits no edema.  Neurological: He is alert.  Speech is clear and goal oriented Moves extremities without ataxia  Skin: Skin is warm and dry. He is not diaphoretic.  Psychiatric: He has a normal mood and affect.  Nursing note and vitals reviewed.    ED Treatments / Results  Labs (all labs ordered are listed, but only abnormal results are displayed) Labs Reviewed  BASIC METABOLIC PANEL - Abnormal; Notable for the following components:      Result Value   Glucose, Bld 63 (*)    Creatinine, Ser 1.54 (*)    GFR calc non Af Amer 55 (*)    All other components within normal limits  URINALYSIS, ROUTINE W REFLEX MICROSCOPIC - Abnormal; Notable for the following components:   Protein, ur 100 (*)    Bacteria, UA RARE (*)    All other components within normal limits  CBG MONITORING, ED - Abnormal; Notable for the following components:   Glucose-Capillary 45 (*)    All other components within normal limits  CBG MONITORING, ED -  Abnormal; Notable for the following components:   Glucose-Capillary 236 (*)    All other components within normal limits  CBG MONITORING, ED - Abnormal; Notable for the following components:   Glucose-Capillary 264 (*)    All other components within normal limits  CBC    Procedures Procedures (including critical care time)  Medications Ordered in ED Medications  sodium chloride 0.9 % bolus 1,000 mL (0 mLs Intravenous Stopped 01/26/18 0200)     Initial Impression / Assessment and Plan / ED Course  I have reviewed the triage vital signs and the nursing notes.  Pertinent labs & imaging results that were available during my care of the patient were reviewed by me and considered in my medical decision making (see chart for details).  Clinical Course as of Jan 27 227  Wynelle Link Jan 26, 2018  0227 Elevated.  Suspect some level of dehydration as this is slightly above his baseline.  Creatinine(!): 1.54 [HM]  0227 Ketones in his urine.  Ketones, ur: NEGATIVE [HM]  0227 Did note protein.  He will need follow-up for this.  Protein(!): 100 [HM]    Clinical Course User Index [HM] Tyrease Vandeberg, Boyd Kerbs    On initial presentation to the emergency department patient hypoglycemic.  He is here for help with his medications but reports fluctuating blood sugars.  Patient was given food here with elevation to the mid 200s where it remained.  Labs with evidence of slight dehydration which I expected due to his polyuria and polydipsia over the last several weeks.  Suspect patient's hypoglycemia secondary to repeat uses of NovoLog without checking his blood sugar or eating.  Discussed danger of this with patient.  I understand his concern for avoiding hyperglycemia.  Patient will decrease his NovoLog to 3 times per day until he can follow with his primary care provider.  He is to check his blood sugar before each NovoLog administration.  Patient states understanding.  Labs are reassuring.  No evidence of  DKA.  Encourage patient to call the wellness center Monday morning to schedule an ER follow-up.  Also discussed reasons to return immediately to the emergency department.  Patient states understanding and is in agreement with this plan.  Final Clinical Impressions(s) / ED Diagnoses   Final diagnoses:  Hypoglycemia  IDDM (insulin dependent diabetes mellitus) (HCC)  Polyuria  Proteinuria, unspecified type    ED Discharge Orders    None       Saaya Procell, Boyd Kerbs 01/26/18 0229    Benjiman Core, MD 02/04/18 [phone]

## 2018-01-25 NOTE — ED Notes (Signed)
CBG recheck 236 after pt given juice and graham crackers

## 2018-01-25 NOTE — ED Notes (Signed)
PA at bedside.

## 2018-01-25 NOTE — ED Triage Notes (Signed)
[facility] and Novolog insulin, pt is currently out of medication.

## 2018-01-26 ENCOUNTER — Other Ambulatory Visit: Payer: Self-pay

## 2018-01-26 NOTE — Discharge Instructions (Addendum)
1. Medications: 10 you taking your NovoLog.  Only take it 3 times per day and check your blood sugar before taking it, usual home medications 2. Treatment: rest, drink plenty of fluids, 3. Follow Up: Please followup with your primary doctor in 1-2 days for discussion of your diagnoses and further evaluation after today's visit; if you do not have a primary care doctor use the resource guide provided to find one; Please return to the ER for worsening symptoms, additional episodes of hypoglycemia or significant hyperglycemia

## 2018-01-26 NOTE — ED Notes (Signed)
Pt verbalizes understanding of d/c instructions. Pt ambulatory at d/c with all belongings.   

## 2018-01-26 NOTE — ED Notes (Signed)
ED Provider at bedside. 

## 2018-01-27 MED FILL — !LANTUS SOLOSTAR 100UNITS/M: 100 | 30 days supply | Qty: 15 | Fill #0

## 2018-01-27 MED FILL — !NOVOLOG 100UNITS/ML VIAL: 100/ML | 28 days supply | Qty: 10 | Fill #0

## 2018-02-05 ENCOUNTER — Ambulatory Visit: Payer: Self-pay | Admitting: Nurse Practitioner

## 2018-02-20 MED FILL — AMLODIPINE BESYLATE 5 MG TA: 5 | 30 days supply | Qty: 30 | Fill #4

## 2018-02-20 MED FILL — DIVALPROEX SOD ER 500 MG TA: 500 | 30 days supply | Qty: 90 | Fill #2

## 2018-02-20 MED FILL — LISINOPRIL 20 MG TAB: 20 | 30 days supply | Qty: 30 | Fill #3

## 2018-02-20 MED FILL — SIMVASTATIN 20 MG TABLET: 20 | 30 days supply | Qty: 30 | Fill #3

## 2018-04-03 MED FILL — NovoLOG 100 UNIT/ML SOLN: 100 | 28 days supply | Qty: 10 | Fill #1

## 2018-04-03 MED FILL — SIMVASTATIN 20 MG TABLET: 20 | 30 days supply | Qty: 30 | Fill #4

## 2018-04-03 MED FILL — LISINOPRIL 20 MG TAB: 20 | 30 days supply | Qty: 30 | Fill #4

## 2018-04-03 MED FILL — AMLODIPINE BESYLATE 5 MG TA: 5 | 30 days supply | Qty: 30 | Fill #5

## 2018-04-03 MED FILL — DIVALPROEX SOD ER 500 MG TA: 500 | 30 days supply | Qty: 90 | Fill #3

## 2018-04-07 ENCOUNTER — Encounter: Payer: Self-pay | Admitting: *Deleted

## 2018-04-07 ENCOUNTER — Telehealth: Payer: Self-pay | Admitting: *Deleted

## 2018-04-07 NOTE — Telephone Encounter (Addendum)
Pt arrive alert and oriented to [facility] today.   Felt like he had a seizure this morning muscle locked and he was unable to do anything. Lives home alone.  He states he Went to sleep 10:40p Woke up in with linen all over the floor and to body aches.  Took medication- Basal insulin and Depakote last night along with other medications.   Last seizure prior to this episode was greater that 6 months- 1year. He feels there was a lot of strenuous activity done on yesterday.   CBG -147 Unable to eat, very nauseated.  VS: T: 97.9 P: 62  R:16 BP: 142/79   Pt instructed to return for OV on Wednesday 04/09/2018 Encouraged patient to get safety alerts since he lives home alone and has seizures. He states he would go to his mothers house until his appointment. Pt PCP aware and agrees to have patient return for OV. Last OV was January, 2019.

## 2018-04-08 ENCOUNTER — Other Ambulatory Visit: Payer: Self-pay | Admitting: Family Medicine

## 2018-04-08 ENCOUNTER — Other Ambulatory Visit: Payer: Self-pay

## 2018-04-08 DIAGNOSIS — E108 Type 1 diabetes mellitus with unspecified complications: Secondary | ICD-10-CM

## 2018-04-09 ENCOUNTER — Ambulatory Visit: Payer: Self-pay | Attending: Family Medicine | Admitting: Physician Assistant

## 2018-04-09 VITALS — BP 144/80 | HR 57 | Temp 97.6°F | Ht 72.0 in | Wt 189.0 lb

## 2018-04-09 DIAGNOSIS — R809 Proteinuria, unspecified: Secondary | ICD-10-CM | POA: Insufficient documentation

## 2018-04-09 DIAGNOSIS — G40909 Epilepsy, unspecified, not intractable, without status epilepticus: Secondary | ICD-10-CM

## 2018-04-09 DIAGNOSIS — Z9119 Patient's noncompliance with other medical treatment and regimen: Secondary | ICD-10-CM | POA: Insufficient documentation

## 2018-04-09 DIAGNOSIS — Z91199 Patient's noncompliance with other medical treatment and regimen due to unspecified reason: Secondary | ICD-10-CM

## 2018-04-09 DIAGNOSIS — Z79899 Other long term (current) drug therapy: Secondary | ICD-10-CM | POA: Insufficient documentation

## 2018-04-09 DIAGNOSIS — E785 Hyperlipidemia, unspecified: Secondary | ICD-10-CM

## 2018-04-09 DIAGNOSIS — Z794 Long term (current) use of insulin: Secondary | ICD-10-CM | POA: Insufficient documentation

## 2018-04-09 DIAGNOSIS — R413 Other amnesia: Secondary | ICD-10-CM

## 2018-04-09 DIAGNOSIS — I10 Essential (primary) hypertension: Secondary | ICD-10-CM

## 2018-04-09 DIAGNOSIS — E109 Type 1 diabetes mellitus without complications: Secondary | ICD-10-CM | POA: Insufficient documentation

## 2018-04-09 DIAGNOSIS — E1029 Type 1 diabetes mellitus with other diabetic kidney complication: Secondary | ICD-10-CM

## 2018-04-09 DIAGNOSIS — E108 Type 1 diabetes mellitus with unspecified complications: Secondary | ICD-10-CM

## 2018-04-09 DIAGNOSIS — E1069 Type 1 diabetes mellitus with other specified complication: Secondary | ICD-10-CM

## 2018-04-09 DIAGNOSIS — Z7982 Long term (current) use of aspirin: Secondary | ICD-10-CM | POA: Insufficient documentation

## 2018-04-09 LAB — POCT GLYCOSYLATED HEMOGLOBIN (HGB A1C): HEMOGLOBIN A1C: 9.8 % — AB (ref 4.0–5.6)

## 2018-04-09 LAB — GLUCOSE, POCT (MANUAL RESULT ENTRY): POC Glucose: 286 mg/dl — AB (ref 70–99)

## 2018-04-09 MED ORDER — AMLODIPINE BESYLATE 5 MG PO TABS: 5.0000 mg | ORAL_TABLET | Freq: Every day | ORAL | 3 refills | Status: DC

## 2018-04-09 MED ORDER — INSULIN GLARGINE 100 UNIT/ML SOLOSTAR PEN: 30.0000 [IU] | PEN_INJECTOR | Freq: Every day | SUBCUTANEOUS | 0 refills | Status: DC

## 2018-04-09 MED ORDER — DIVALPROEX SODIUM ER 500 MG PO TB24: 1500.0000 mg | ORAL_TABLET | Freq: Every day | ORAL | 0 refills | Status: DC

## 2018-04-09 MED ORDER — LISINOPRIL 20 MG PO TABS: 20.0000 mg | ORAL_TABLET | Freq: Every morning | ORAL | 3 refills | Status: DC

## 2018-04-09 MED ORDER — SIMVASTATIN 20 MG PO TABS: 20.0000 mg | ORAL_TABLET | Freq: Every day | ORAL | 3 refills | Status: DC

## 2018-04-09 MED FILL — !LANTUS SOLOSTAR 100UNITS/M: 100 | 30 days supply | Qty: 15 | Fill #0

## 2018-04-09 NOTE — Progress Notes (Signed)
No Lantus since Monday.  Patient has been forgetful.

## 2018-04-09 NOTE — Patient Instructions (Signed)
Check blood sugars 3-4 times daily and record and bring to next visit. 

## 2018-04-09 NOTE — Progress Notes (Signed)
Patient ID: [name], male   DOB: 08-31-1975, 42 y.o.   MRN: [id number]      [name], is a 42 y.o. male  [id number]  [id number]  DOB - 1976-06-29  Subjective:  Chief Complaint and HPI: [name] is a 42 y.o. male here today Patient called in to clinic 04/07/2018 reporting he thought he had had a seizure. He had awakened with all the bedsheets in the floor.  Blood sugar was 146.  Small bite to tongue.   Complains that it felt as though his muscles locked and he was unable to do anything at the time.  He feels as though he had overdone it the day before.  He lives at home alone.  Last seizure was about 6 months to a year ago. Did not hit head.  No vision changes. Out of lantus and BP meds X3 days  Takes depakote about 70% of the time.   Always takes lantus.  Rarely takes Novolog bc of worry that it will affect how he will be thought of at work.  Dx with Type 1 DM at age 23.    Social History:  Lives alone, works as Hospital doctor with heating and air  ROS:   Constitutional:  No f/c, No night sweats, No unexplained weight loss. EENT:  No vision changes, No blurry vision, No hearing changes. No mouth, throat, or ear problems.  Respiratory: No cough, No SOB Cardiac: No CP, no palpitations GI:  No abd pain, No N/V/D. GU: No Urinary s/sx Musculoskeletal: No joint pain Neuro: No headache, no dizziness, no motor weakness.  Skin: No rash Endocrine:  No polydipsia. No polyuria.  Psych: Denies SI/HI  No problems updated.  ALLERGIES: Allergies  Allergen Reactions  . Ziprasidone Mesylate Other (See Comments)    "severe muscle spasms"    PAST MEDICAL HISTORY: Past Medical History:  Diagnosis Date  . Diabetes mellitus   . Hypertension   . Seizures (HCC)     MEDICATIONS AT HOME: Prior to Admission medications   Medication Sig Start Date End Date Taking? Authorizing Provider  albuterol (PROVENTIL HFA;VENTOLIN HFA) 108 (90 Base) MCG/ACT inhaler Inhale 1-2 puffs into  the lungs every 6 (six) hours as needed for wheezing or shortness of breath. 01/25/18  Yes Wendling, Jilda Roche, DO  amLODipine (NORVASC) 5 MG tablet Take 1 tablet (5 mg total) by mouth daily. 04/09/18  Yes [name], PA-C  aspirin 81 MG tablet Take 81 mg by mouth daily.   Yes [provider]  divalproex (DEPAKOTE ER) 500 MG 24 hr tablet Take 3 tablets (1,500 mg total) by mouth daily. 04/09/18  Yes Georgian Co M, PA-C  glucose blood (TRUE METRIX BLOOD GLUCOSE TEST) test strip Use as instructed 08/05/17  Yes Claiborne Rigg, NP  insulin aspart (NOVOLOG) 100 UNIT/ML injection Inject 1-4 Units into the skin 3 (three) times daily before meals. If sugar > 120 01/25/18  Yes Wendling, Jilda Roche, DO  Insulin Pen Needle (B-D UF III MINI PEN NEEDLES) 31G X 5 MM MISC Insert into the skin as prescribed 06/10/17  Yes Claiborne Rigg, NP  Insulin Syringe-Needle U-100 (B-D INS SYRINGE 0.5CC/31GX5/16) 31G X 5/16" 0.5 ML MISC 1 each by Does not apply route 3 (three) times daily. 03/05/16  Yes Funches, Josalyn, MD  lisinopril (PRINIVIL,ZESTRIL) 20 MG tablet Take 1 tablet (20 mg total) by mouth every morning. 04/09/18  Yes Ludene Stokke, Marzella Schlein, PA-C  Omega-3 Fatty Acids (FISH OIL PO) Take 1  capsule by mouth daily.   Yes [provider]  simvastatin (ZOCOR) 20 MG tablet Take 1 tablet (20 mg total) by mouth daily. 04/09/18  Yes Davine Sweney, Marzella Schlein, PA-C  TRUEPLUS LANCETS 28G MISC 1 each by Does not apply route 3 (three) times daily. 03/10/15  Yes Funches, Josalyn, MD  Insulin Glargine (LANTUS) 100 UNIT/ML Solostar Pen Inject 30 Units into the skin daily. May increase Lantus by 2 units every 3 days until fasting blood sugar is 100-130. 04/09/18 05/09/18  [name], PA-C     Objective:  EXAM:   Vitals:   04/09/18 0845  BP: (!) 144/80  Pulse: (!) 57  Temp: 97.6 F (36.4 C)  TempSrc: Oral  SpO2: 100%  Weight: 189 lb (85.7 kg)  Height: 6' (1.829 m)    General appearance : A&OX3.  NAD. Non-toxic-appearing HEENT: Atraumatic and Normocephalic.  PERRLA. EOM intact.  Neck: supple, no JVD. No cervical lymphadenopathy. No thyromegaly Chest/Lungs:  Breathing-non-labored, Good air entry bilaterally, breath sounds normal without rales, rhonchi, or wheezing  CVS: S1 S2 regular, no murmurs, gallops, rubs  Extremities: Bilateral Lower Ext shows no edema, both legs are warm to touch with = pulse throughout Neurology:  CN II-XII grossly intact, Non focal.   Psych:  TP linear. J/I fair. slow speech. Appropriate eye contact and flat affect.  Skin:  No Rash  Data Review Lab Results  Component Value Date   HGBA1C 9.8 (A) 04/09/2018   HGBA1C 9.6 08/05/2017   HGBA1C 9.2 05/08/2017   MMSE - Mini Mental State Exam 04/09/2018 04/09/2018  Orientation to time 4 4  Orientation to Place 5 5  Registration 3 3  Attention/ Calculation 0 0  Recall 3 3  Language- name 2 objects 2 2  Language- repeat 1 1  Language- follow 3 step command 3 3  Language- read & follow direction 1 1  Write a sentence 1 1  Copy design 1 1  Total score 24 24     Assessment & Plan   1. Memory difficulties MMSE=24 - TSH - Ambulatory referral to Neurology  2. DM (diabetes mellitus), type 1 with complications (HCC) Uncontrolled.  Check blood sugar tid-qid and record and bring to next visit.  Work on compliance with Novolog.   - Glucose (CBG) - HgB A1c - Insulin Glargine (LANTUS) 100 UNIT/ML Solostar Pen; Inject 30 Units into the skin daily. May increase Lantus by 2 units every 3 days until fasting blood sugar is 100-130.  Dispense: 5 pen; Refill: 0  3. Seizure disorder (HCC) ?recent seizure, hasn't seen neuro in >1 year.  Also, increase compliance with depatkote bc only taking about 70% of the time.   - Valproic acid level - divalproex (DEPAKOTE ER) 500 MG 24 hr tablet; Take 3 tablets (1,500 mg total) by mouth daily.  Dispense: 90 tablet; Refill: 0 - Ambulatory referral to Neurology  4. Essential  hypertension, benign Uncontrolled-resume medication - amLODipine (NORVASC) 5 MG tablet; Take 1 tablet (5 mg total) by mouth daily.  Dispense: 90 tablet; Refill: 3 - lisinopril (PRINIVIL,ZESTRIL) 20 MG tablet; Take 1 tablet (20 mg total) by mouth every morning.  Dispense: 90 tablet; Refill: 3 - CBC with Differential/Platelet  5. Type 1 diabetes mellitus with hyperlipidemia (HCC) - simvastatin (ZOCOR) 20 MG tablet; Take 1 tablet (20 mg total) by mouth daily.  Dispense: 90 tablet; Refill: 3 - Comprehensive metabolic panel - Lipid panel  6. 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  Dispense: 90 tablet; Refill: 3  7. Non-compliance Increase adherence and compliance with meds and diabetic diet.     Patient have been counseled extensively about nutrition and exercise  Return in about 3 weeks (around 04/30/2018) for appt with Zelda for DM recheck.  The patient was given clear instructions to go to ER or return to medical center if symptoms don't improve, worsen or new problems develop. The patient verbalized understanding. The patient was told to call to get lab results if they haven't heard anything in the next week.     Georgian Co, PA-C [facility] and [facility] [place], [place] [phone]   04/09/2018, 9:13 AM

## 2018-04-10 LAB — CBC WITH DIFFERENTIAL/PLATELET
Basophils Absolute: 0 10*3/uL (ref 0.0–0.2)
Basos: 0 %
EOS (ABSOLUTE): 0.2 10*3/uL (ref 0.0–0.4)
Eos: 3 %
Hematocrit: 40 % (ref 37.5–51.0)
Hemoglobin: 12.7 g/dL — ABNORMAL LOW (ref 13.0–17.7)
IMMATURE GRANULOCYTES: 0 %
Immature Grans (Abs): 0 10*3/uL (ref 0.0–0.1)
Lymphocytes Absolute: 2.1 10*3/uL (ref 0.7–3.1)
Lymphs: 34 %
MCH: 29.4 pg (ref 26.6–33.0)
MCHC: 31.8 g/dL (ref 31.5–35.7)
MCV: 93 fL (ref 79–97)
MONOS ABS: 0.4 10*3/uL (ref 0.1–0.9)
Monocytes: 7 %
NEUTROS PCT: 56 %
Neutrophils Absolute: 3.4 10*3/uL (ref 1.4–7.0)
PLATELETS: 185 10*3/uL (ref 150–450)
RBC: 4.32 x10E6/uL (ref 4.14–5.80)
RDW: 12.2 % — AB (ref 12.3–15.4)
WBC: 6.1 10*3/uL (ref 3.4–10.8)

## 2018-04-10 LAB — VALPROIC ACID LEVEL: Valproic Acid Lvl: 97 ug/mL (ref 50–100)

## 2018-04-10 LAB — COMPREHENSIVE METABOLIC PANEL
A/G RATIO: 2 (ref 1.2–2.2)
ALK PHOS: 46 IU/L (ref 39–117)
ALT: 15 IU/L (ref 0–44)
AST: 21 IU/L (ref 0–40)
Albumin: 4.2 g/dL (ref 3.5–5.5)
BUN/Creatinine Ratio: 16 (ref 9–20)
BUN: 21 mg/dL (ref 6–24)
Bilirubin Total: 0.3 mg/dL (ref 0.0–1.2)
CO2: 28 mmol/L (ref 20–29)
Calcium: 9 mg/dL (ref 8.7–10.2)
Chloride: 98 mmol/L (ref 96–106)
Creatinine, Ser: 1.32 mg/dL — ABNORMAL HIGH (ref 0.76–1.27)
GFR calc Af Amer: 77 mL/min/{1.73_m2} (ref >59)
GFR calc non Af Amer: 67 mL/min/{1.73_m2} (ref >59)
Globulin, Total: 2.1 g/dL (ref 1.5–4.5)
Glucose: 347 mg/dL — ABNORMAL HIGH (ref 65–99)
POTASSIUM: 4.5 mmol/L (ref 3.5–5.2)
Sodium: 138 mmol/L (ref 134–144)
Total Protein: 6.3 g/dL (ref 6.0–8.5)

## 2018-04-10 LAB — LIPID PANEL
Chol/HDL Ratio: 2.9 ratio (ref 0.0–5.0)
Cholesterol, Total: 191 mg/dL (ref 100–199)
HDL: 66 mg/dL (ref >39)
LDL Calculated: 114 mg/dL — ABNORMAL HIGH (ref 0–99)
Triglycerides: 55 mg/dL (ref 0–149)
VLDL Cholesterol Cal: 11 mg/dL (ref 5–40)

## 2018-04-10 LAB — TSH: TSH: 2.08 u[IU]/mL (ref 0.450–4.500)

## 2018-04-11 ENCOUNTER — Telehealth: Payer: Self-pay

## 2018-04-11 NOTE — Telephone Encounter (Signed)
-----   Message from [name], [place] sent at 04/10/2018 12:46 PM EDT ----- Please call patient.  depakote levels are good.  Continue current dose of depakote.  Blood sugar is high and needs better control as we discussed.  Cholesterol is adequately controlled.  Liver function and electrolytes are normal.  Kidneys slightly impaired probably bc of blood sugar.  Follow-up as planned.  Thanks, Georgian Co, PA-C

## 2018-04-11 NOTE — Telephone Encounter (Signed)
Patient was called, answered, verified DOB, and was given most recent lab results. Patient verbalized understanding and had no further questions.

## 2018-04-11 NOTE — Telephone Encounter (Signed)
-----   Message from Angela M McClung, PA-C sent at 04/10/2018 12:46 PM EDT ----- Please call patient.  depakote levels are good.  Continue current dose of depakote.  Blood sugar is high and needs better control as we discussed.  Cholesterol is adequately controlled.  Liver function and electrolytes are normal.  Kidneys slightly impaired probably bc of blood sugar.  Follow-up as planned.  Thanks, Angela McClung, PA-C 

## 2018-04-15 ENCOUNTER — Encounter: Payer: Self-pay | Admitting: Neurology

## 2018-04-25 ENCOUNTER — Ambulatory Visit: Payer: Self-pay | Admitting: Nurse Practitioner

## 2018-05-26 ENCOUNTER — Ambulatory Visit: Payer: Self-pay | Attending: Nurse Practitioner | Admitting: Family Medicine

## 2018-05-26 ENCOUNTER — Ambulatory Visit (HOSPITAL_BASED_OUTPATIENT_CLINIC_OR_DEPARTMENT_OTHER): Payer: Self-pay | Admitting: Licensed Clinical Social Worker

## 2018-05-26 VITALS — BP 144/69 | HR 67 | Temp 98.8°F | Resp 18 | Ht 72.0 in | Wt 184.0 lb

## 2018-05-26 DIAGNOSIS — Z794 Long term (current) use of insulin: Secondary | ICD-10-CM | POA: Insufficient documentation

## 2018-05-26 DIAGNOSIS — F331 Major depressive disorder, recurrent, moderate: Secondary | ICD-10-CM

## 2018-05-26 DIAGNOSIS — Z833 Family history of diabetes mellitus: Secondary | ICD-10-CM | POA: Insufficient documentation

## 2018-05-26 DIAGNOSIS — F419 Anxiety disorder, unspecified: Secondary | ICD-10-CM

## 2018-05-26 DIAGNOSIS — E10649 Type 1 diabetes mellitus with hypoglycemia without coma: Secondary | ICD-10-CM

## 2018-05-26 DIAGNOSIS — E11649 Type 2 diabetes mellitus with hypoglycemia without coma: Secondary | ICD-10-CM

## 2018-05-26 DIAGNOSIS — Z8249 Family history of ischemic heart disease and other diseases of the circulatory system: Secondary | ICD-10-CM | POA: Insufficient documentation

## 2018-05-26 DIAGNOSIS — G479 Sleep disorder, unspecified: Secondary | ICD-10-CM

## 2018-05-26 DIAGNOSIS — Z7982 Long term (current) use of aspirin: Secondary | ICD-10-CM | POA: Insufficient documentation

## 2018-05-26 DIAGNOSIS — Z79899 Other long term (current) drug therapy: Secondary | ICD-10-CM | POA: Insufficient documentation

## 2018-05-26 DIAGNOSIS — E1065 Type 1 diabetes mellitus with hyperglycemia: Secondary | ICD-10-CM

## 2018-05-26 DIAGNOSIS — R739 Hyperglycemia, unspecified: Secondary | ICD-10-CM

## 2018-05-26 DIAGNOSIS — E108 Type 1 diabetes mellitus with unspecified complications: Secondary | ICD-10-CM

## 2018-05-26 LAB — GLUCOSE, POCT (MANUAL RESULT ENTRY)
POC Glucose: 404 mg/dL — AB (ref 70–99)
POC Glucose: 366 mg/dL — AB (ref 70–99)

## 2018-05-26 MED ORDER — INSULIN GLARGINE 100 UNIT/ML SOLOSTAR PEN: 30.0000 [IU] | PEN_INJECTOR | Freq: Every day | SUBCUTANEOUS | 0 refills | Status: DC

## 2018-05-26 MED ORDER — TRAZODONE HCL 50 MG PO TABS: 50.0000 mg | ORAL_TABLET | Freq: Every evening | ORAL | 3 refills | Status: DC | PRN

## 2018-05-26 MED FILL — traZODone HCL 50 MG TABS: 50 | 30 days supply | Qty: 30 | Fill #0

## 2018-05-26 NOTE — Progress Notes (Signed)
Subjective:    Patient ID: [name], male    DOB: [birth year], 42 y.o.   MRN: [id number]  HPI       42 year old male new to me as a patient who was seen in follow-up of episodes of hypoglycemia.  Patient is diabetic and patient had hemoglobin A1c done April 09, 2018 at 9.8.  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  Patient believes that because of the increased walking he has had difficulty with low blood sugars.  Patient states that he normally takes his Lantus at 30 units in the morning but started waking up with blood sugars overnight in the 30s, 40s and 70s.  Patient decided to switch taking his Lantus to 8 PM after his evening meal.  Patient states that since switching his Lantus to 8 PM and also decreasing slightly to 28 units daily for Lantus that his blood sugar has been in the 140s to 160s in the mornings.      Patient reports that he was in a hurry to get today's visit and ended up eating two beef hotdogs and states that this is why his blood sugar is high this afternoon.  Patient currently is thirsty.  Patient admits that he normally does not eat breakfast.  Patient has some fatigue.  Patient denies any visual disturbance.  Patient has had no recent urinary frequency.  Patient has had no chest pain or palpitations.  No shortness of breath or cough.  Patient reports that he has been having issues with his sleep.  Patient states he is able to fall asleep but then will wake up in a few hours and have difficulty going back to sleep.  Patient reports he has taken trazodone in the past to help with sleep and patient would like to have a prescription/refill of this medication.  Past Medical History:  Diagnosis Date  . Diabetes mellitus   . Hypertension   . Seizures (HCC)    Past Surgical History:  Procedure Laterality Date  . HERNIA REPAIR     Family History  Problem Relation Age of Onset  . Hypertension Mother   . Diabetes Paternal  Uncle    Social History   Tobacco Use  . Smoking status: Never Smoker  . Smokeless tobacco: Never Used  Substance Use Topics  . Alcohol use: Yes    Alcohol/week: 1.0 standard drinks    Types: 1 Glasses of wine per week    Comment: rarely  . Drug use: No   Allergies  Allergen Reactions  . Ziprasidone Mesylate Other (See Comments)    "severe muscle spasms"   Current Outpatient Medications on File Prior to Visit  Medication Sig Dispense Refill  . albuterol (PROVENTIL HFA;VENTOLIN HFA) 108 (90 Base) MCG/ACT inhaler Inhale 1-2 puffs into the lungs every 6 (six) hours as needed for wheezing or shortness of breath. 1 Inhaler 0  . amLODipine (NORVASC) 5 MG tablet Take 1 tablet (5 mg total) by mouth daily. 90 tablet 3  . aspirin 81 MG tablet Take 81 mg by mouth daily.    . divalproex (DEPAKOTE ER) 500 MG 24 hr tablet Take 3 tablets (1,500 mg total) by mouth daily. 90 tablet 0  . glucose blood (TRUE METRIX BLOOD GLUCOSE TEST) test strip Use as instructed 100 each 12  . insulin aspart (NOVOLOG) 100 UNIT/ML injection Inject 1-4 Units into the skin 3 (three) times daily before meals. If sugar > 120  30 mL 0  . Insulin Pen Needle (B-D UF III MINI PEN NEEDLES) 31G X 5 MM MISC Insert into the skin as prescribed 90 each 1  . Insulin Syringe-Needle U-100 (B-D INS SYRINGE 0.5CC/31GX5/16) 31G X 5/16" 0.5 ML MISC 1 each by Does not apply route 3 (three) times daily. 90 each 11  . lisinopril (PRINIVIL,ZESTRIL) 20 MG tablet Take 1 tablet (20 mg total) by mouth every morning. 90 tablet 3  . Omega-3 Fatty Acids (FISH OIL PO) Take 1 capsule by mouth daily.    . simvastatin (ZOCOR) 20 MG tablet Take 1 tablet (20 mg total) by mouth daily. 90 tablet 3  . TRUEPLUS LANCETS 28G MISC 1 each by Does not apply route 3 (three) times daily. 100 each 11   No current facility-administered medications on file prior to visit.        Review of Systems  Constitutional: Positive for fatigue. Negative for chills and  fever.  HENT: Negative for sore throat and trouble swallowing.   Eyes: Negative for photophobia and visual disturbance.  Respiratory: Negative for cough and shortness of breath.   Gastrointestinal: Negative for abdominal pain and nausea.  Endocrine: Positive for polydipsia. Negative for polyphagia and polyuria.  Genitourinary: Negative for dysuria and frequency.  Musculoskeletal: Negative for back pain and gait problem.  Neurological: Positive for light-headedness. Negative for dizziness and headaches.       Objective:   Physical Exam BP (!) 144/69 (BP Location: Right Arm, Patient Position: Sitting, Cuff Size: Normal)   Pulse 67   Temp 98.8 F (37.1 C) (Oral)   Resp 18   Ht 6' (1.829 m)   Wt 184 lb (83.5 kg)   SpO2 100%   BMI 24.95 kg/m Nurse's notes and vital signs reviewed General-well-nourished, well-developed adult male in no acute distress Neck-supple, no lymphadenopathy, no thyromegaly, no carotid bruit Lungs-clear to auscultation bilaterally Cardiovascular-regular rate and rhythm Abdomen-soft, nontender Back-no CVA tenderness Extremities-no edema Psychologic-patient exhibits normal mood and judgment at today's visit (medical judgement regarding diabetes is not very accurate)       Assessment & Plan:  1. DM (diabetes mellitus), type 1 with complications [facility]) Patient reports a history of type 1 diabetes for which he is on insulin.  Patient reports that he had had some episodes of hypoglycemia but this has resolved since change the time of day that he takes his Lantus.  Patient however also admits that he does not eat breakfast and that his current job entails a lot of activity which can further drop his blood sugars.  Discussed with the patient the need to eat on a regular schedule as well as how exercise can lower blood sugars.  Patient was asked to return for an appointment with the clinical pharmacist to discuss his insulin regimen and eating schedule. - Glucose  (CBG) - Glucose (CBG) - Insulin Glargine (LANTUS) 100 UNIT/ML Solostar Pen; Inject 30 Units into the skin daily.   Dispense: 5 pen; Refill: 0  2. Acute hyperglycemia Patient's blood sugar at today's visit was 4 4 and patient received 10 units of regular insulin.  Patient reports that his sugar was high because he ate 2 hotdogs prior to today's visit.  Patient however also admits that he often skips meals and sometimes changes his insulin regimen.  Patient is encouraged to return to clinic for an appointment with the clinical pharmacist to discuss his diet and insulin regimen.  Patient's blood sugar did decrease to 366 upon recheck after regular insulin  given in office.  3. Hypoglycemia associated with diabetes [facility](HCC) Patient reports that he has had some issues with hypoglycemia but states that this is now resolved after switching the time of day that he takes his Lantus.  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    4. Sleep disturbance Patient reports that he has had issues with sleep in the past and patient would like a prescription for trazodone which she is also taken in the past.  Prescription was provided for trazodone and sleep hygiene reviewed with the patient.  Patient was encouraged to make sure that he eats a high-protein snack prior to bedtime to help avoid hypoglycemic episodes since he will also be taking medication for sleep. - traZODone (DESYREL) 50 MG tablet; Take 1 tablet (50 mg total) by mouth at bedtime as needed for sleep.  Dispense: 30 tablet; Refill: 3  An After Visit Summary was printed and given to the patient.  Return in about 4 weeks (around 06/23/2018).

## 2018-05-27 MED ORDER — INSULIN GLARGINE 100 UNIT/ML SOLOSTAR PEN: PEN_INJECTOR | SUBCUTANEOUS | 0 refills | Status: DC

## 2018-05-27 NOTE — BH Specialist Note (Signed)
Integrated Behavioral Health Initial Visit  MRN: [id number] Name: [name]  Number of Integrated Behavioral Health Clinician visits:: 1/6 Session Start time: 3:15 PM  Session End time: 3:45 PM Total time: 30 minutes  Type of Service: Integrated Behavioral Health- Individual/Family Interpretor:No. Interpretor Name and Language: N/A   Warm Hand Off Completed.       SUBJECTIVE: [name] is a 42 y.o. male accompanied by self Patient was referred by Dr. Jillyn HiddenFulp for depression and anxiety. Patient reports the following symptoms/concerns: Pt has hx of trauma. He is currently experiencing financial strain and difficulty managing mental and physical health Duration of problem: Ongoing; Severity of problem: moderate  OBJECTIVE: Mood: Anxious and Depressed and Affect: Appropriate Risk of harm to self or others: No plan to harm self or others Pt scored positive on phq9; however, denied current SI/HI/AVH. Protective factors were identified, safety plan was discussed, and crisis intervention resources were provided  LIFE CONTEXT: Family and Social: Pt has family and attends church; however, receives limited support School/Work: Pt is employed Self-Care: Pt has hx of receiving behavioral health services (medication management) however reports adverse side effects to the medicine. Pt enjoys working out to cope with stressors Life Changes: Pt has difficulty managing with chronic medical conditions and mental health  GOALS ADDRESSED: Patient will: 1. Reduce symptoms of: anxiety, depression and stress 2. Increase knowledge and/or ability of: coping skills and healthy habits  3. Demonstrate ability to: Increase healthy adjustment to current life circumstances, Increase adequate support systems for patient/family and Improve medication compliance  INTERVENTIONS: Interventions utilized: Supportive Counseling, Psychoeducation and/or Health Education and Link to [facility]   Standardized Assessments completed: GAD-7 and PHQ 2&9  ASSESSMENT: Patient currently experiencing depression and anxiety triggered by difficulty managing with chronic medical conditions and mental health. Pt has hx of trauma and is currently experiencing financial strain. Pt scored positive on phq9; however, denied current SI/HI/AVH. Protective factors were identified, safety plan was discussed, and crisis intervention resources were provided.   Patient may benefit from psychoeducation, psychotherapy, and medication management. LCSWA educated pt on the correlation between one's physical and mental health, in addition, to how stress can negatively impact both. Therapeutic strategies were discussed to assist with decrease in symptoms and relaxation. Pt is agreeable to medication to assist with sleep. A SCAT application was provided to assist pt with stable transportation.   PLAN: 1. Follow up with behavioral health clinician on : Pt was encouraged to contact LCSWA if symptoms worsen or fail to improve to schedule behavioral appointments at [facility]. 2. Behavioral recommendations: LCSWA recommends that pt apply healthy coping skills discussed. Pt is encouraged to schedule follow up appointment with LCSWA 3. Referral(s): Integrated [facility]al Health Services (In Clinic) and [employer]:  Transportation 4. "From scale of 1-10, how likely are you to follow plan?":   Bridgett LarssonJasmine D Karimah Winquist, LCSW 05/28/18 3:49 PM

## 2018-05-28 MED FILL — $LANTUS SOLOSTAR 100 UNITS/: 100 | 30 days supply | Qty: 9 | Fill #0

## 2018-06-02 ENCOUNTER — Telehealth: Payer: Self-pay | Admitting: Licensed Clinical Social Worker

## 2018-06-02 NOTE — Telephone Encounter (Signed)
LCSWA placed call to pt regarding his SCAT application. Pt was informed of missing signatures. Pt agreed to come to the office at his earliest convenience to complete application.

## 2018-06-17 ENCOUNTER — Telehealth: Payer: Self-pay | Admitting: Nurse Practitioner

## 2018-06-17 NOTE — Telephone Encounter (Signed)
Pt called to request refills for: LISINOPRIL DIVALPROEX SIMVASTATIN AMLODIPINE Pt ph [phone].

## 2018-06-18 MED FILL — LISINOPRIL 20 MG TAB: 20 | 30 days supply | Qty: 30 | Fill #0

## 2018-06-18 MED FILL — DIVALPROEX SOD ER 500 MG TA: 500 | 30 days supply | Qty: 90 | Fill #0

## 2018-06-18 MED FILL — AMLODIPINE BESYLATE 5 MG TA: 5 | 90 days supply | Qty: 90 | Fill #0

## 2018-06-18 MED FILL — SIMVASTATIN 20 MG TABLET: 20 | 30 days supply | Qty: 30 | Fill #0

## 2018-06-18 NOTE — Telephone Encounter (Signed)
Medications were filled and picked up at our pharmacy earlier this morning. No further action needed.

## 2018-06-23 ENCOUNTER — Ambulatory Visit: Payer: Self-pay | Attending: Nurse Practitioner | Admitting: Nurse Practitioner

## 2018-06-23 ENCOUNTER — Encounter: Payer: Self-pay | Admitting: Neurology

## 2018-06-23 ENCOUNTER — Other Ambulatory Visit (INDEPENDENT_AMBULATORY_CARE_PROVIDER_SITE_OTHER): Payer: Self-pay

## 2018-06-23 ENCOUNTER — Other Ambulatory Visit: Payer: Self-pay

## 2018-06-23 ENCOUNTER — Encounter: Payer: Self-pay | Admitting: Nurse Practitioner

## 2018-06-23 ENCOUNTER — Ambulatory Visit (INDEPENDENT_AMBULATORY_CARE_PROVIDER_SITE_OTHER): Payer: Self-pay | Admitting: Neurology

## 2018-06-23 VITALS — BP 138/71 | HR 96 | Temp 98.5°F | Resp 16 | Wt 186.8 lb

## 2018-06-23 VITALS — BP 126/70 | HR 64 | Ht 72.0 in | Wt 187.0 lb

## 2018-06-23 DIAGNOSIS — E10649 Type 1 diabetes mellitus with hypoglycemia without coma: Secondary | ICD-10-CM | POA: Insufficient documentation

## 2018-06-23 DIAGNOSIS — G40309 Generalized idiopathic epilepsy and epileptic syndromes, not intractable, without status epilepticus: Secondary | ICD-10-CM

## 2018-06-23 DIAGNOSIS — Z794 Long term (current) use of insulin: Secondary | ICD-10-CM | POA: Insufficient documentation

## 2018-06-23 DIAGNOSIS — Z7982 Long term (current) use of aspirin: Secondary | ICD-10-CM | POA: Insufficient documentation

## 2018-06-23 DIAGNOSIS — Z79899 Other long term (current) drug therapy: Secondary | ICD-10-CM | POA: Insufficient documentation

## 2018-06-23 DIAGNOSIS — I10 Essential (primary) hypertension: Secondary | ICD-10-CM | POA: Insufficient documentation

## 2018-06-23 DIAGNOSIS — Z8249 Family history of ischemic heart disease and other diseases of the circulatory system: Secondary | ICD-10-CM | POA: Insufficient documentation

## 2018-06-23 DIAGNOSIS — E108 Type 1 diabetes mellitus with unspecified complications: Secondary | ICD-10-CM

## 2018-06-23 DIAGNOSIS — Z833 Family history of diabetes mellitus: Secondary | ICD-10-CM | POA: Insufficient documentation

## 2018-06-23 DIAGNOSIS — M25511 Pain in right shoulder: Secondary | ICD-10-CM

## 2018-06-23 DIAGNOSIS — R413 Other amnesia: Secondary | ICD-10-CM | POA: Insufficient documentation

## 2018-06-23 LAB — GLUCOSE, POCT (MANUAL RESULT ENTRY)
POC Glucose: 46 mg/dl — AB (ref 70–99)
POC Glucose: 129 mg/dl — AB (ref 70–99)
POC Glucose: 58 mg/dl — AB (ref 70–99)
POC Glucose: 64 mg/dl — AB (ref 70–99)

## 2018-06-23 NOTE — Addendum Note (Signed)
Addended by: Horatio PelOSTELLO, Kriss Perleberg on: 06/23/2018 03:11 PM   Modules accepted: Orders

## 2018-06-23 NOTE — Progress Notes (Signed)
Assessment & Plan:  Donald was seen today for diabetes.  Diagnoses and all orders for this visit:  DM (diabetes mellitus), type 1 with complications (HCC) -     POCT glucose (manual entry) -     POCT glucose (manual entry) -     POCT glucose (manual entry) -     Glucose (CBG) Diabetes is poorly controlled. Advised patient to keep a fasting blood sugar log fast, 2 hours post lunch and bedtime which will be reviewed at the next office visit.  Continue blood sugar control as discussed in office today, low carbohydrate diet, and regular physical exercise as tolerated, 150 minutes per week (30 min each day, 5 days per week, or 50 min 3 days per week). Keep blood sugar logs with fasting goal of 90-130 mg/dl, post prandial (after you eat) less than 180.  For Hypoglycemia: BS <60 and Hyperglycemia BS >400; contact the clinic ASAP. Annual eye exams and foot exams are recommended.  Memory loss -     B12 and Folate Panel    Patient has been counseled on age-appropriate routine health concerns for screening and prevention. These are reviewed and up-to-date. Referrals have been placed accordingly. Immunizations are up-to-date or declined.    Subjective:   Chief Complaint  Patient presents with  . Diabetes   HPI [name] 42 y.o. male presents to office today for follow up to diabetes mellitus Type 1.   DM TYPE 1 Unfortunately it is not time for his A1c to be checked today. He is not diet or exercise compliant. His blood sugar is in the 40s. He is asymptomatic, has not eaten today and reports already administering lantus prior to his office visit. He is administering 30 units of lantus during the day and states he stopped giving himself lantus at night due to hypoglycemia in the mornings. His blood glucose readings are still erratic and he still has not applied for the financial assistance although I have urged him to apply for over a year. Today he has complaints of increased memory loss  over the past few months. Will check B12 and folate.  Current regimen includes: Lantus 30 units and Novolog 1-4 units TID. He is taking an ACE inhibitor.   Essential Hypertension Blood pressure is well controlled today. Current antihypertensive regimen includes Amlodipine 5mg  and lisinopril 20mg . He currently endorses medication compliance and denies chest pain, shortness of breath, palpitations, lightheadedness, dizziness, headaches or BLE edema.  BP Readings from Last 3 Encounters:  06/23/18 138/71  06/23/18 126/70  05/26/18 (!) 144/69   Review of Systems  Constitutional: Negative for fever, malaise/fatigue and weight loss.  HENT: Negative.  Negative for nosebleeds.   Eyes: Negative.  Negative for blurred vision, double vision and photophobia.  Respiratory: Negative.  Negative for cough and shortness of breath.   Cardiovascular: Negative.  Negative for chest pain, palpitations and leg swelling.  Gastrointestinal: Negative.  Negative for heartburn, nausea and vomiting.  Musculoskeletal: Negative.  Negative for myalgias.  Neurological: Positive for seizures (seeing neurology). Negative for dizziness, focal weakness and headaches.  Psychiatric/Behavioral: Negative.  Negative for suicidal ideas.    Past Medical History:  Diagnosis Date  . Diabetes mellitus   . Hypertension   . Seizures (HCC)     Past Surgical History:  Procedure Laterality Date  . HERNIA REPAIR      Family History  Problem Relation Age of Onset  . Hypertension Mother   . Diabetes Paternal Uncle  Social History Reviewed with no changes to be made today.   Outpatient Medications Prior to Visit  Medication Sig Dispense Refill  . albuterol (PROVENTIL HFA;VENTOLIN HFA) 108 (90 Base) MCG/ACT inhaler Inhale 1-2 puffs into the lungs every 6 (six) hours as needed for wheezing or shortness of breath. 1 Inhaler 0  . amLODipine (NORVASC) 5 MG tablet Take 1 tablet (5 mg total) by mouth daily. 90 tablet 3  . aspirin  81 MG tablet Take 81 mg by mouth daily.    . divalproex (DEPAKOTE ER) 500 MG 24 hr tablet Take 3 tablets (1,500 mg total) by mouth daily. 90 tablet 0  . glucose blood (TRUE METRIX BLOOD GLUCOSE TEST) test strip Use as instructed 100 each 12  . insulin aspart (NOVOLOG) 100 UNIT/ML injection Inject 1-4 Units into the skin 3 (three) times daily before meals. If sugar > 120 30 mL 0  . Insulin Glargine (LANTUS) 100 UNIT/ML Solostar Pen inject 30 units subcutaneously once daily 5 pen 0  . Insulin Pen Needle (B-D UF III MINI PEN NEEDLES) 31G X 5 MM MISC Insert into the skin as prescribed 90 each 1  . Insulin Syringe-Needle U-100 (B-D INS SYRINGE 0.5CC/31GX5/16) 31G X 5/16" 0.5 ML MISC 1 each by Does not apply route 3 (three) times daily. 90 each 11  . lisinopril (PRINIVIL,ZESTRIL) 20 MG tablet Take 1 tablet (20 mg total) by mouth every morning. 90 tablet 3  . Omega-3 Fatty Acids (FISH OIL PO) Take 1 capsule by mouth daily.    . simvastatin (ZOCOR) 20 MG tablet Take 1 tablet (20 mg total) by mouth daily. 90 tablet 3  . traZODone (DESYREL) 50 MG tablet Take 1 tablet (50 mg total) by mouth at bedtime as needed for sleep. 30 tablet 3  . TRUEPLUS LANCETS 28G MISC 1 each by Does not apply route 3 (three) times daily. 100 each 11   No facility-administered medications prior to visit.     Allergies  Allergen Reactions  . Ziprasidone Mesylate Other (See Comments)    "severe muscle spasms"       Objective:    BP 138/71   Pulse 96   Temp 98.5 F (36.9 C) (Oral)   Resp 16   Wt 186 lb 12.8 oz (84.7 kg)   SpO2 98%   BMI 25.33 kg/m  Wt Readings from Last 3 Encounters:  06/23/18 186 lb 12.8 oz (84.7 kg)  06/23/18 187 lb (84.8 kg)  05/26/18 184 lb (83.5 kg)    Physical Exam Vitals signs and nursing note reviewed.  Constitutional:      Appearance: He is well-developed.  HENT:     Head: Normocephalic and atraumatic.  Neck:     Musculoskeletal: Normal range of motion.  Cardiovascular:     Rate  and Rhythm: Normal rate and regular rhythm.     Heart sounds: Normal heart sounds. No murmur. No friction rub. No gallop.   Pulmonary:     Effort: Pulmonary effort is normal. No tachypnea or respiratory distress.     Breath sounds: Normal breath sounds. No decreased breath sounds, wheezing, rhonchi or rales.  Chest:     Chest wall: No tenderness.  Abdominal:     General: Bowel sounds are normal.     Palpations: Abdomen is soft.  Musculoskeletal: Normal range of motion.  Skin:    General: Skin is warm and dry.  Neurological:     Mental Status: He is alert and oriented to person, place, and time.  Coordination: Coordination normal.  Psychiatric:        Behavior: Behavior normal. Behavior is cooperative.        Thought Content: Thought content normal.        Judgment: Judgment normal.          Patient has been counseled extensively about nutrition and exercise as well as the importance of adherence with medications and regular follow-up. The patient was given clear instructions to go to ER or return to medical center if symptoms don't improve, worsen or new problems develop. The patient verbalized understanding.   Follow-up: Return in about 4 weeks (around 07/21/2018) for DM.   Claiborne RiggZelda W Soleil Mas, FNP-BC [facility] [place], [place] [phone]   06/23/2018, 6:00 PM

## 2018-06-23 NOTE — Patient Instructions (Addendum)
1. Schedule MRI brain with and without contrast  We have sent a referral to [facility] for your MRI and they will call you directly to schedule your appt. They are located at [address] If you need to contact them directly please call [phone].   2. Schedule 1-hour EEG 3. Check Depakote level  Your provider requests that you have LABS drawn today.  We share a lab with [facility] - they are located in [address] (second floor) of this building.  Once you get there, please have a seat and the phlebotomist will call your name.  If you have waited more than 15 minutes, please advise the front desk  4. Continue current dose of Depakote 5. Follow-up in 6 months, call for any changes  Seizure Precautions: 1. If medication has been prescribed for you to prevent seizures, take it exactly as directed.  Do not stop taking the medicine without talking to your doctor first, even if you have not had a seizure in a long time.   2. Avoid activities in which a seizure would cause danger to yourself or to others.  Don't operate dangerous machinery, swim alone, or climb in high or dangerous places, such as on ladders, roofs, or girders.  Do not drive unless your doctor says you may.  3. If you have any warning that you may have a seizure, lay down in a safe place where you can't hurt yourself.    4.  No driving for 6 months from last seizure, as per [place].   Please refer to the following link on the Epilepsy Foundation of America's website for more information: http://www.epilepsyfoundation.org/answerplace/Social/driving/drivingu.cfm   5.  Maintain good sleep hygiene. Avoid alcohol.  6.  Contact your doctor if you have any problems that may be related to the medicine you are taking.  7.  Call 911 and bring the patient back to the ED if:        A.  The seizure lasts longer than 5 minutes.       B.  The patient doesn't awaken shortly after the  seizure  C.  The patient has new problems such as difficulty seeing, speaking or moving  D.  The patient was injured during the seizure  E.  The patient has a temperature over 102 F (39C)  F.  The patient vomited and now is having trouble breathing

## 2018-06-23 NOTE — Progress Notes (Signed)
NEUROLOGY CONSULTATION NOTE  [name] MRN: [id number] DOB: 04-07-1976  Referring provider: Bertram Denver, NP Primary care provider: Georgian Co, PA-C  Reason for consult:  Seizures, memory loss  Thank you for your kind referral of [name] for consultation of the above symptoms. Although his history is well known to you, please allow me to reiterate it for the purpose of our medical record. He is alone in the office today. Records and images were personally reviewed where available.  HISTORY OF PRESENT ILLNESS: This is a pleasant 42 year old left-handed man with a history of type I diabetes, hypertension, presenting to establish care for seizures. He reports that seizures started around age 42. All his seizures appear nocturnal, usually occurring early in the morning where he wakes up with a tongue bite and incontinence, or his mother wakes him up. He recalls the worst seizure at age 42 when he bit his tongue so bad and woke up in the ER with right shoulder ?subluxation/dislocation. He states he was told it was not fractured. He has been taking Depakote ER 1500mg  qhs for several years without side effects and does not recall trying other AEDs. His longest seizure-free interval has been 3 years. Last seizure was 04/07/18, he woke up with bedsheets wet on the floor, lightheaded. Previous to this, he recalls a seizure 2 months prior. He lives alone and has not noticed any recent episodes of gaps in time, but in the past would be "out of space" when his sugars would go to 30-40s. Seizure triggers include exhaustion, sleep deprivation, heat, and low blood sugar (30-40). He reports glucose levels have been okay recently. He noticed some hand jerking when younger, none recently. He tastes blood after seizures. He has not been told of any staring/unresponsive episodes. He does not usually have headaches, but recently has been having headaches once a week with diffuse thumping where he has to  focus his eyes. No nausea/vomiting, visual obscurations. He denies any dizziness, diplopia, dysarthria/dysphagia, neck/back pain, bowel/bladder dysfunction. He reinjured his right shoulder a few months ago and now has a sharp sensation and tingling when he pulls his right shoulder back or lifts his arms. He started to notice memory changes a few months ago, he has to write things down or put them on a calendar. MMSE 24/30 at PCP office in October 2019. He states his stress level has been up since April, worse with the shoulder injury. He denies missing medications or bills. He is planning to go back to school. He has some sleep issues which Trazodone helps with.   Epilepsy Risk Factors:  He is unsure if his mother and a paternal cousin have seizures (he has not personally witnessed any). He had a head injury at age 42 where he lost consciousness and has a scar on the right brow. Otherwise he had a normal birth and early development.  There is no history of febrile convulsions, CNS infections such as meningitis/encephalitis, neurosurgical procedures.  Laboratory Data: Lab Results  Component Value Date   VALPROATE 97 04/09/2018   PAST MEDICAL HISTORY: Past Medical History:  Diagnosis Date  . Diabetes mellitus   . Hypertension   . Seizures (HCC)     PAST SURGICAL HISTORY: Past Surgical History:  Procedure Laterality Date  . HERNIA REPAIR      MEDICATIONS: Current Outpatient Medications on File Prior to Visit  Medication Sig Dispense Refill  . albuterol (PROVENTIL HFA;VENTOLIN HFA) 108 (90 Base) MCG/ACT inhaler Inhale 1-2  puffs into the lungs every 6 (six) hours as needed for wheezing or shortness of breath. 1 Inhaler 0  . amLODipine (NORVASC) 5 MG tablet Take 1 tablet (5 mg total) by mouth daily. 90 tablet 3  . aspirin 81 MG tablet Take 81 mg by mouth daily.    . divalproex (DEPAKOTE ER) 500 MG 24 hr tablet Take 3 tablets (1,500 mg total) by mouth daily. 90 tablet 0  . glucose blood (TRUE  METRIX BLOOD GLUCOSE TEST) test strip Use as instructed 100 each 12  . insulin aspart (NOVOLOG) 100 UNIT/ML injection Inject 1-4 Units into the skin 3 (three) times daily before meals. If sugar > 120 30 mL 0  . Insulin Glargine (LANTUS) 100 UNIT/ML Solostar Pen inject 30 units subcutaneously once daily 5 pen 0  . Insulin Pen Needle (B-D UF III MINI PEN NEEDLES) 31G X 5 MM MISC Insert into the skin as prescribed 90 each 1  . Insulin Syringe-Needle U-100 (B-D INS SYRINGE 0.5CC/31GX5/16) 31G X 5/16" 0.5 ML MISC 1 each by Does not apply route 3 (three) times daily. 90 each 11  . lisinopril (PRINIVIL,ZESTRIL) 20 MG tablet Take 1 tablet (20 mg total) by mouth every morning. 90 tablet 3  . Omega-3 Fatty Acids (FISH OIL PO) Take 1 capsule by mouth daily.    . simvastatin (ZOCOR) 20 MG tablet Take 1 tablet (20 mg total) by mouth daily. 90 tablet 3  . traZODone (DESYREL) 50 MG tablet Take 1 tablet (50 mg total) by mouth at bedtime as needed for sleep. 30 tablet 3  . TRUEPLUS LANCETS 28G MISC 1 each by Does not apply route 3 (three) times daily. 100 each 11   No current facility-administered medications on file prior to visit.     ALLERGIES: Allergies  Allergen Reactions  . Ziprasidone Mesylate Other (See Comments)    "severe muscle spasms"    FAMILY HISTORY: Family History  Problem Relation Age of Onset  . Hypertension Mother   . Diabetes Paternal Uncle     SOCIAL HISTORY: Social History   Socioeconomic History  . Marital status: Single    Spouse name: Not on file  . Number of children: Not on file  . Years of education: Not on file  . Highest education level: Not on file  Occupational History  . Not on file  Social Needs  . Financial resource strain: Not on file  . Food insecurity:    Worry: Not on file    Inability: Not on file  . Transportation needs:    Medical: Not on file    Non-medical: Not on file  Tobacco Use  . Smoking status: Never Smoker  . Smokeless tobacco: Never  Used  Substance and Sexual Activity  . Alcohol use: Yes    Alcohol/week: 1.0 standard drinks    Types: 1 Glasses of wine per week    Comment: rarely  . Drug use: No  . Sexual activity: Not Currently  Lifestyle  . Physical activity:    Days per week: Not on file    Minutes per session: Not on file  . Stress: Not on file  Relationships  . Social connections:    Talks on phone: Not on file    Gets together: Not on file    Attends religious service: Not on file    Active member of club or organization: Not on file    Attends meetings of clubs or organizations: Not on file    Relationship status:  Not on file  . Intimate partner violence:    Fear of current or ex partner: Not on file    Emotionally abused: Not on file    Physically abused: Not on file    Forced sexual activity: Not on file  Other Topics Concern  . Not on file  Social History Narrative   Pt is left handed   Lives alone in 2 story home   Has 3 children   Some college education   Worked as HerbalistHVAC assistant    REVIEW OF SYSTEMS: Constitutional: No fevers, chills, or sweats, no generalized fatigue, change in appetite Eyes: No visual changes, double vision, eye pain Ear, nose and throat: No hearing loss, ear pain, nasal congestion, sore throat Cardiovascular: No chest pain, palpitations Respiratory:  No shortness of breath at rest or with exertion, wheezes GastrointestinaI: No nausea, vomiting, diarrhea, abdominal pain, fecal incontinence Genitourinary:  No dysuria, urinary retention or frequency Musculoskeletal:  No neck pain, back pain Integumentary: No rash, pruritus, skin lesions Neurological: as above Psychiatric: No depression, +insomnia, anxiety Endocrine: No palpitations, fatigue, diaphoresis, mood swings, change in appetite, change in weight, increased thirst Hematologic/Lymphatic:  No anemia, purpura, petechiae. Allergic/Immunologic: no itchy/runny eyes, nasal congestion, recent allergic reactions,  rashes  PHYSICAL EXAM: Vitals:   06/23/18 1028  BP: 126/70  Pulse: 64  SpO2: 100%   General: No acute distress Head:  Normocephalic/atraumatic Eyes: Fundoscopic exam shows bilateral sharp discs, no vessel changes, exudates, or hemorrhages Neck: supple, no paraspinal tenderness, full range of motion Back: No paraspinal tenderness Heart: regular rate and rhythm Lungs: Clear to auscultation bilaterally. Vascular: No carotid bruits. Skin/Extremities: No rash, no edema Neurological Exam: Mental status: alert and oriented to person, place, and time, no dysarthria or aphasia, Fund of knowledge is appropriate.  Remote memory  Intact. 1/3 delayed recall  Attention and concentration are normal.    Able to name objects and repeat phrases. Cranial nerves: CN I: not tested CN II: pupils equal, round and reactive to light, visual fields intact, fundi unremarkable. CN III, IV, VI:  full range of motion, no nystagmus, no ptosis CN V: facial sensation intact CN VII: upper and lower face symmetric CN VIII: hearing intact to finger rub CN IX, X: gag intact, uvula midline CN XI: sternocleidomastoid and trapezius muscles intact CN XII: tongue midline Bulk & Tone: normal, no fasciculations. Motor: 5/5 throughout with no pronator drift. Sensation: intact to light touch, cold, pin, vibration and joint position sense.  No extinction to double simultaneous stimulation.  Romberg test negative Deep Tendon Reflexes: +1 throughout, no ankle clonus Plantar responses: downgoing bilaterally Cerebellar: no incoordination on finger to nose, heel to shin. No dysdiadochokinesia Gait: narrow-based and steady, able to tandem walk adequately. Tremor: none  IMPRESSION: This is a pleasant 42 year old left-handed man with a history of type I diabetes, hypertension, and seizures suggestive of primary generalized epilepsy. It appears he has primarily nocturnal seizures. He continues to have seizures on Depakote ER 1500mg   qhs. Check Depakote level, he reports compliance. MRI brain with and without contrast and a 1-hour EEG will be ordered to further classify his seizures. We may add on a second AED such as Zonisamide. He is also reporting increase in headaches and memory loss, although he reports a significant increase in stress levels since April, which may be contributing. [place] driving laws were discussed with the patient, and he knows to stop driving after a seizure, until 6 months seizure-free. He will follow-up in 6  months and knows to call for any changes.   Thank you for allowing me to participate in the care of this patient. Please do not hesitate to call for any questions or concerns.   Patrcia Dolly, M.D.  CC: Bertram Denver, NP, Georgian Co, PA-C

## 2018-06-24 LAB — VALPROIC ACID LEVEL: VALPROIC ACID LVL: 48.9 mg/L — AB (ref 50.0–100.0)

## 2018-06-24 LAB — B12 AND FOLATE PANEL
Folate: 16.3 ng/mL (ref >3.0)
Vitamin B-12: 750 pg/mL (ref 232–1245)

## 2018-06-26 ENCOUNTER — Telehealth: Payer: Self-pay | Admitting: Nurse Practitioner

## 2018-06-26 NOTE — Telephone Encounter (Signed)
Patient called requesting a letter be written for his college counselor stating all his medical conditions, please follow up with the etter and call patient when its ready for pick up

## 2018-07-04 ENCOUNTER — Encounter: Payer: Self-pay | Admitting: Nurse Practitioner

## 2018-07-04 NOTE — Telephone Encounter (Signed)
Letter is in his chart 

## 2018-07-07 ENCOUNTER — Telehealth: Payer: Self-pay

## 2018-07-07 NOTE — Telephone Encounter (Signed)
Contacted pt to go over lab results pt is aware and doesn't have any questions or concerns 

## 2018-07-11 MED FILL — $LANTUS SOLOSTAR 100 UNITS/: 100 | 20 days supply | Qty: 6 | Fill #1

## 2018-07-14 ENCOUNTER — Other Ambulatory Visit: Payer: Self-pay | Admitting: Neurology

## 2018-07-24 MED FILL — $LANTUS SOLOSTAR 100 UNITS/: 100 | 30 days supply | Qty: 15 | Fill #1

## 2018-08-21 ENCOUNTER — Other Ambulatory Visit: Payer: Self-pay | Admitting: Physician Assistant

## 2018-08-21 DIAGNOSIS — G40909 Epilepsy, unspecified, not intractable, without status epilepticus: Secondary | ICD-10-CM

## 2018-08-25 MED FILL — DIVALPROEX SOD ER 500 MG TA: 500 | 30 days supply | Qty: 90 | Fill #0

## 2018-09-17 MED FILL — LISINOPRIL 20 MG TAB: 20 | 30 days supply | Qty: 30 | Fill #1

## 2018-09-17 MED FILL — AMLODIPINE BESYLATE 5 MG TA: 5 | 30 days supply | Qty: 30 | Fill #1

## 2018-09-17 MED FILL — SIMVASTATIN 20 MG TABLET: 20 | 30 days supply | Qty: 30 | Fill #1

## 2018-09-18 ENCOUNTER — Other Ambulatory Visit: Payer: Self-pay | Admitting: Physician Assistant

## 2018-09-18 DIAGNOSIS — E108 Type 1 diabetes mellitus with unspecified complications: Secondary | ICD-10-CM

## 2018-09-19 NOTE — Telephone Encounter (Signed)
°  Insulin Aspart 1-4 Units Subcutaneous 3 times daily before meals, If sugar > 120    Insulin Glargine 100 UNIT/ML inject 30 units subcutaneously once daily      Pharmacy [facility]   *patient states he does not have anymore medication

## 2018-09-19 NOTE — Telephone Encounter (Signed)
Will route to PCP 

## 2018-09-22 ENCOUNTER — Other Ambulatory Visit: Payer: Self-pay | Admitting: Physician Assistant

## 2018-09-22 DIAGNOSIS — E108 Type 1 diabetes mellitus with unspecified complications: Secondary | ICD-10-CM

## 2018-09-22 MED FILL — $LANTUS SOLOSTAR 100 UNITS/: 100 | 75 days supply | Qty: 30 | Fill #0

## 2018-09-22 NOTE — Telephone Encounter (Signed)
Patient came in to the pharmacy and was notified Rx refilled.

## 2018-09-29 ENCOUNTER — Other Ambulatory Visit: Payer: Self-pay | Admitting: Nurse Practitioner

## 2018-09-29 DIAGNOSIS — G40909 Epilepsy, unspecified, not intractable, without status epilepticus: Secondary | ICD-10-CM

## 2018-09-29 MED FILL — NovoLOG 100 UNIT/ML SOLN: 100 | 28 days supply | Qty: 10 | Fill #2

## 2018-10-03 MED FILL — DIVALPROEX SOD ER 500 MG TA: 500 | 30 days supply | Qty: 90 | Fill #0

## 2018-10-27 ENCOUNTER — Other Ambulatory Visit: Payer: Self-pay

## 2018-10-27 ENCOUNTER — Encounter: Payer: Self-pay | Admitting: Nurse Practitioner

## 2018-10-27 ENCOUNTER — Ambulatory Visit: Payer: Self-pay | Attending: Nurse Practitioner | Admitting: Nurse Practitioner

## 2018-10-27 DIAGNOSIS — I10 Essential (primary) hypertension: Secondary | ICD-10-CM

## 2018-10-27 DIAGNOSIS — E108 Type 1 diabetes mellitus with unspecified complications: Secondary | ICD-10-CM

## 2018-10-27 DIAGNOSIS — G8929 Other chronic pain: Secondary | ICD-10-CM

## 2018-10-27 DIAGNOSIS — Z0000 Encounter for general adult medical examination without abnormal findings: Secondary | ICD-10-CM

## 2018-10-27 DIAGNOSIS — E1029 Type 1 diabetes mellitus with other diabetic kidney complication: Secondary | ICD-10-CM

## 2018-10-27 DIAGNOSIS — R809 Proteinuria, unspecified: Secondary | ICD-10-CM

## 2018-10-27 DIAGNOSIS — M25511 Pain in right shoulder: Secondary | ICD-10-CM

## 2018-10-27 MED ORDER — INSULIN PEN NEEDLE 31G X 5 MM MISC: 1 refills | Status: DC

## 2018-10-27 MED ORDER — "INSULIN SYRINGE-NEEDLE U-100 31G X 5/16"" 0.5 ML MISC": 1.0000 | Freq: Three times a day (TID) | 11 refills | Status: DC

## 2018-10-27 MED ORDER — DICLOFENAC SODIUM 1 % TD GEL: 2.0000 g | Freq: Four times a day (QID) | TRANSDERMAL | 0 refills | Status: AC

## 2018-10-27 MED ORDER — LISINOPRIL 20 MG PO TABS: 20.0000 mg | ORAL_TABLET | Freq: Every morning | ORAL | 3 refills | Status: DC

## 2018-10-27 MED ORDER — AMLODIPINE BESYLATE 5 MG PO TABS: 5.0000 mg | ORAL_TABLET | Freq: Every day | ORAL | 3 refills | Status: DC

## 2018-10-27 MED ORDER — INSULIN ASPART 100 UNIT/ML ~~LOC~~ SOLN: 1.0000 [IU] | Freq: Three times a day (TID) | SUBCUTANEOUS | 0 refills | Status: DC

## 2018-10-27 MED ORDER — INSULIN GLARGINE 100 UNIT/ML SOLOSTAR PEN: PEN_INJECTOR | SUBCUTANEOUS | 1 refills | Status: DC

## 2018-10-27 MED FILL — !NOVOLOG 100UNITS/ML VIAL: 100/ML | 25 days supply | Qty: 6 | Fill #0

## 2018-10-27 MED FILL — LISINOPRIL 20 MG TAB: 20 | 30 days supply | Qty: 30 | Fill #0

## 2018-10-27 MED FILL — TRUEPLUS SYR 0.5ML 31GX5/16: 31G X 5/16" | 30 days supply | Qty: 100 | Fill #0

## 2018-10-27 MED FILL — TRUEPLUS PEN NDL 31GX3/16: 31G X 5 MM | 30 days supply | Qty: 100 | Fill #0

## 2018-10-27 MED FILL — TRUEPLUS PEN NDL 31GX3/16": 31G X 5 MM | 30 days supply | Qty: 100 | Fill #0

## 2018-10-27 MED FILL — AMLODIPINE BESYLATE 5 MG TA: 5 | 30 days supply | Qty: 30 | Fill #0

## 2018-10-27 NOTE — Progress Notes (Signed)
Virtual Visit via Telephone Note  I connected with [name] on 10/27/18  at   1:30 PM EDT  EDT by telephone and verified that I am speaking with the correct person using two identifiers.   Consent I discussed the limitations, risks, security and privacy concerns of performing an evaluation and management service by telephone and the availability of in person appointments. I also discussed with the patient that there may be a patient responsible charge related to this service. The patient expressed understanding and agreed to proceed.   Location of Patient: Private residence    Location of Provider: [place] and [place] participating in Telemedicine visit: Geryl Rankins FNP-BC [place]    History of Present Illness: Telemedicine evaluation for follow up to DM. He has complaints of persistent right shoulder pain.   DM TYPE 1 Chronic and poorly controlled. He endorses medication compliance however norvasc prescription expired a few months ago. He states he has been sitting at home and not as active. Feels he has gained weight. Fasting Blood glucose levels 160-180. Postprandial 140-160s. Still has not applied for insurance. Also taking lantus 34 UNITS. He was instructed to increase his lantus based on fasting readings however he has not been increasing it and stopped at 34 units a few months ago. I have instructed him to follow the directions giving from our last office visit in regard to increasing his insulin by 2 units every 3 days based on his glucose readings. Taking an ACE and a statin. Lipids not within goal and he has not been compliant with ACE based on microalbuminuria abnormality.  Lab Results  Component Value Date   HGBA1C 9.8 (A) 04/09/2018   Lab Results  Component Value Date   LDLCALC 114 (H) 04/09/2018    Right Shoulder Pain Onset several months ago. Had a seizure and unsure if he sustained an injury to his right  shoulder at that time.  He feels like his shoulder has been dislocated. Has not been able lift any heavy objects. Sometimes feels a "popping sensation in the shoulder".   Pain 8/10 upon arising in the am then subsides to 5-6. He does not take any pain medications nor does he wear a sling or brace.   Essential Hypertension BP Readings from Last 3 Encounters:  06/23/18 138/71  06/23/18 126/70  05/26/18 (!) 144/69  Endorses medication compliance taking lisinopril 58m daily and amlodipine 560mdaily. Denies chest pain, shortness of breath, palpitations, lightheadedness, dizziness, headaches or BLE edema. He does not monitor his blood pressure at home.    Observations/Objective: Awake, alert and oriented x 3   Assessment and Plan: RiSri [place]as seen today for follow-up.  Diagnoses and all orders for this visit:  Essential hypertension -     lisinopril (ZESTRIL) 20 MG tablet; Take 1 tablet (20 mg total) by mouth every morning. -     amLODipine (NORVASC) 5 MG tablet; Take 1 tablet (5 mg total) by mouth daily. -     CMP14+EGFR; Future -     CBC; Future Continue all antihypertensives as prescribed.  Remember to bring in your blood pressure log with you for your follow up appointment.  DASH/Mediterranean Diets are healthier choices for HTN.    DM (diabetes mellitus), type 1 with complications (HCC) -     Insulin Glargine (LANTUS SOLOSTAR) 100 UNIT/ML Solostar Pen; INJECT 34 UNITS INTO THE SKIN DAILY. MAY INCREASE LANTUS BY 2 UNITS EVERY 3  DAYS UNTIL FASTING BLOOD SUGAR IS 100-130. -     insulin aspart (NOVOLOG) 100 UNIT/ML injection; Inject 1-4 Units into the skin 3 (three) times daily before meals. If sugar > 120 -     Insulin Syringe-Needle U-100 (B-D INS SYRINGE 0.5CC/31GX5/16) 31G X 5/16" 0.5 ML MISC; 1 each by Does not apply route 3 (three) times daily. -     Insulin Pen Needle (B-D UF III MINI PEN NEEDLES) 31G X 5 MM MISC; Insert into the skin as prescribed -     Lipid panel; Future -      Hemoglobin A1c; Future Continue blood sugar control as discussed in office today, low carbohydrate diet, and regular physical exercise as tolerated, 150 minutes per week (30 min each day, 5 days per week, or 50 min 3 days per week). Keep blood sugar logs with fasting goal of 90-130 mg/dl, post prandial (after you eat) less than 180.  For Hypoglycemia: BS <60 and Hyperglycemia BS >400; contact the clinic ASAP. Annual eye exams and foot exams are recommended.   Routine adult health maintenance -     VITAMIN D 25 Hydroxy (Vit-D Deficiency, Fractures); Future  Microalbuminuria due to type 1 diabetes mellitus (HCC) -     lisinopril (ZESTRIL) 20 MG tablet; Take 1 tablet (20 mg total) by mouth every morning.  Chronic right shoulder pain -     diclofenac sodium (VOLTAREN) 1 % GEL; Apply 2 g topically 4 (four) times daily for 30 days. No heavy lifting He does not like taking oral medications. Prescribed gel. Needs XRAY     Follow Up Instructions Return in about 3 months (around 01/26/2019).     I discussed the assessment and treatment plan with the patient. The patient was provided an opportunity to ask questions and all were answered. The patient agreed with the plan and demonstrated an understanding of the instructions.   The patient was advised to call back or seek an in-person evaluation if the symptoms worsen or if the condition fails to improve as anticipated.  I provided 20 minutes of non-face-to-face time during this encounter including median intraservice time, reviewing previous notes, labs, imaging, medications and explaining diagnosis and management.  Gildardo Pounds, FNP-BC

## 2018-10-28 ENCOUNTER — Emergency Department (HOSPITAL_COMMUNITY)
Admission: EM | Admit: 2018-10-28 | Discharge: 2018-10-28 | Disposition: A | Discharge status: Home / Self Care | Payer: Self-pay | Attending: Emergency Medicine | Admitting: Emergency Medicine

## 2018-10-28 ENCOUNTER — Emergency Department (HOSPITAL_COMMUNITY): Payer: Self-pay

## 2018-10-28 ENCOUNTER — Other Ambulatory Visit: Payer: Self-pay

## 2018-10-28 ENCOUNTER — Encounter (HOSPITAL_COMMUNITY): Payer: Self-pay | Admitting: Emergency Medicine

## 2018-10-28 ENCOUNTER — Ambulatory Visit: Payer: Self-pay | Attending: Family Medicine

## 2018-10-28 DIAGNOSIS — Y999 Unspecified external cause status: Secondary | ICD-10-CM | POA: Insufficient documentation

## 2018-10-28 DIAGNOSIS — Z8669 Personal history of other diseases of the nervous system and sense organs: Secondary | ICD-10-CM | POA: Insufficient documentation

## 2018-10-28 DIAGNOSIS — E108 Type 1 diabetes mellitus with unspecified complications: Secondary | ICD-10-CM

## 2018-10-28 DIAGNOSIS — Z79899 Other long term (current) drug therapy: Secondary | ICD-10-CM | POA: Insufficient documentation

## 2018-10-28 DIAGNOSIS — Y939 Activity, unspecified: Secondary | ICD-10-CM | POA: Insufficient documentation

## 2018-10-28 DIAGNOSIS — Z794 Long term (current) use of insulin: Secondary | ICD-10-CM | POA: Insufficient documentation

## 2018-10-28 DIAGNOSIS — G8929 Other chronic pain: Secondary | ICD-10-CM

## 2018-10-28 DIAGNOSIS — X58XXXA Exposure to other specified factors, initial encounter: Secondary | ICD-10-CM | POA: Insufficient documentation

## 2018-10-28 DIAGNOSIS — Y929 Unspecified place or not applicable: Secondary | ICD-10-CM | POA: Insufficient documentation

## 2018-10-28 DIAGNOSIS — Z0000 Encounter for general adult medical examination without abnormal findings: Secondary | ICD-10-CM

## 2018-10-28 DIAGNOSIS — I10 Essential (primary) hypertension: Secondary | ICD-10-CM | POA: Insufficient documentation

## 2018-10-28 DIAGNOSIS — Z7982 Long term (current) use of aspirin: Secondary | ICD-10-CM | POA: Insufficient documentation

## 2018-10-28 DIAGNOSIS — S42124A Nondisplaced fracture of acromial process, right shoulder, initial encounter for closed fracture: Secondary | ICD-10-CM | POA: Insufficient documentation

## 2018-10-28 DIAGNOSIS — M25511 Pain in right shoulder: Secondary | ICD-10-CM

## 2018-10-28 DIAGNOSIS — E109 Type 1 diabetes mellitus without complications: Secondary | ICD-10-CM | POA: Insufficient documentation

## 2018-10-28 NOTE — ED Provider Notes (Signed)
[facility] EMERGENCY DEPARTMENT Provider Note   CSN: [id number] Arrival date & time: 10/28/18  1118    History   Chief Complaint Chief Complaint  Patient presents with  . Shoulder Pain    HPI [name] is a 43 y.o. male.     The history is provided by the patient.  Shoulder Pain  Location:  Shoulder Shoulder location:  R shoulder Injury: no   Pain details:    Quality:  Aching and dull   Radiates to:  Does not radiate   Severity:  Mild   Onset quality:  Gradual   Timing:  Intermittent   Progression:  Waxing and waning Dislocation: no   Prior injury to area:  Yes Relieved by:  Nothing Worsened by:  Nothing Associated symptoms: decreased range of motion and stiffness   Associated symptoms: no back pain, no fever, no muscle weakness, no neck pain, no numbness, no swelling and no tingling     Past Medical History:  Diagnosis Date  . Diabetes mellitus   . Hypertension   . Seizures [facility])     Patient Active Problem List   Diagnosis Date Noted  . 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 Knee pain 03/16/2016  . Scrotal pain 01/13/2016  . Seizure disorder (HCC) 03/10/2015  . 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 UNSPECIFIED VITAMIN D DEFICIENCY 06/16/2009  . PTSD (post-traumatic stress disorder) 03/26/2008  . Essential hypertension, benign 03/24/2008  . SLEEP DISORDER 03/24/2008  . SLEEP DEPRIVATION 02/09/2008  . DM (diabetes mellitus), type 1 with complications (HCC) 04/04/2007    Past Surgical History:  Procedure Laterality Date  . HERNIA REPAIR          Home Medications    Prior to Admission medications   Medication Sig Start Date End Date Taking? Authorizing Provider  albuterol (PROVENTIL HFA;VENTOLIN HFA) 108 (90 Base) MCG/ACT inhaler Inhale 1-2 puffs into the lungs every 6 (six) hours as needed for wheezing or shortness of  breath. 01/25/18   Wendling, Jilda Roche, DO  amLODipine (NORVASC) 5 MG tablet Take 1 tablet (5 mg total) by mouth daily. 10/27/18   Claiborne Rigg, NP  aspirin 81 MG tablet Take 81 mg by mouth daily.    [provider]  diclofenac sodium (VOLTAREN) 1 % GEL Apply 2 g topically 4 (four) times daily for 30 days. 10/27/18 11/26/18  Claiborne Rigg, NP  divalproex (DEPAKOTE ER) 500 MG 24 hr tablet TAKE 3 TABLETS (1,500 MG TOTAL) BY MOUTH DAILY. 09/29/18   Claiborne Rigg, NP  glucose blood (TRUE METRIX BLOOD GLUCOSE TEST) test strip Use as instructed 08/05/17   Claiborne Rigg, NP  insulin aspart (NOVOLOG) 100 UNIT/ML injection Inject 1-4 Units into the skin 3 (three) times daily before meals. If sugar > 120 10/27/18   Claiborne Rigg, NP  Insulin Glargine (LANTUS SOLOSTAR) 100 UNIT/ML Solostar Pen INJECT 34 UNITS INTO THE SKIN DAILY. MAY INCREASE LANTUS BY 2 UNITS EVERY 3 DAYS UNTIL FASTING BLOOD SUGAR IS 100-130. 10/27/18   Claiborne Rigg, NP  Insulin Pen Needle (B-D UF III MINI PEN NEEDLES) 31G X 5 MM MISC Insert into the skin as prescribed 10/27/18   Claiborne Rigg, NP  Insulin Syringe-Needle U-100 (B-D INS SYRINGE 0.5CC/31GX5/16) 31G X 5/16" 0.5 ML MISC 1 each by Does not apply route 3 (three) times daily. 10/27/18   Claiborne Rigg,  NP  lisinopril (ZESTRIL) 20 MG tablet Take 1 tablet (20 mg total) by mouth every morning. 10/27/18   Claiborne RiggFleming, Zelda W, NP  Omega-3 Fatty Acids (FISH OIL PO) Take 1 capsule by mouth daily.    [provider]  simvastatin (ZOCOR) 20 MG tablet Take 1 tablet (20 mg total) by mouth daily. 04/09/18   Anders SimmondsMcClung, Angela M, PA-C  traZODone (DESYREL) 50 MG tablet Take 1 tablet (50 mg total) by mouth at bedtime as needed for sleep. 05/26/18   Fulp, Cammie, MD  TRUEPLUS LANCETS 28G MISC 1 each by Does not apply route 3 (three) times daily. 03/10/15   Dessa PhiFunches, Josalyn, MD    Family History Family History  Problem Relation Age of Onset  . Hypertension Mother   .  Diabetes Paternal Uncle     Social History Social History   Tobacco Use  . Smoking status: Never Smoker  . Smokeless tobacco: Never Used  Substance Use Topics  . Alcohol use: Yes    Alcohol/week: 1.0 standard drinks    Types: 1 Glasses of wine per week    Comment: rarely  . Drug use: No     Allergies   Ziprasidone mesylate and Geodon [ziprasidone hcl]   Review of Systems Review of Systems  Constitutional: Negative for chills and fever.  HENT: Negative for ear pain and sore throat.   Eyes: Negative for pain and visual disturbance.  Respiratory: Negative for cough and shortness of breath.   Cardiovascular: Negative for chest pain and palpitations.  Gastrointestinal: Negative for abdominal pain and vomiting.  Genitourinary: Negative for dysuria and hematuria.  Musculoskeletal: Positive for arthralgias and stiffness. Negative for back pain and neck pain.  Skin: Negative for color change and rash.  Neurological: Negative for seizures and syncope.  All other systems reviewed and are negative.    Physical Exam Updated Vital Signs  ED Triage Vitals  Enc Vitals Group     BP 10/28/18 1127 136/82     Pulse Rate 10/28/18 1127 73     Resp 10/28/18 1127 16     Temp 10/28/18 1127 98.2 F (36.8 C)     Temp Source 10/28/18 1127 Oral     SpO2 10/28/18 1127 100 %     Weight --      Height --      Head Circumference --      Peak Flow --      Pain Score 10/28/18 1125 9     Pain Loc --      Pain Edu? --      Excl. in GC? --     Physical Exam Constitutional:      General: He is not in acute distress.    Appearance: He is not ill-appearing.  Neck:     Musculoskeletal: Normal range of motion and neck supple.  Musculoskeletal:        General: No tenderness or signs of injury.     Comments: Mildly decreased range of motion of the right shoulder secondary to discomfort, no obvious dislocation or laxity, no obvious tenderness  Skin:    General: Skin is warm.     Capillary  Refill: Capillary refill takes less than 2 seconds.  Neurological:     General: No focal deficit present.     Sensory: No sensory deficit.     Motor: No weakness.      ED Treatments / Results  Labs (all labs ordered are listed, but only abnormal results are displayed) Labs  Reviewed - No data to display  EKG None  Radiology Dg Shoulder Right  Result Date: 10/28/2018 CLINICAL DATA:  Diminished range of motion EXAM: RIGHT SHOULDER - 2+ VIEW COMPARISON:  None. FINDINGS: There is lucency through the acromion worrisome for a nondisplaced fracture. Glenohumeral joint is anatomically aligned. There is otherwise no fracture or dislocation. Unremarkable soft tissues. IMPRESSION: Possible nondisplaced fracture of the acromion. Electronically Signed   By: Jolaine Click M.D.   On: 10/28/2018 11:50    Procedures Procedures (including critical care time)  Medications Ordered in ED Medications - No data to display   Initial Impression / Assessment and Plan / ED Course  I have reviewed the triage vital signs and the nursing notes.  Pertinent labs & imaging results that were available during my care of the patient were reviewed by me and considered in my medical decision making (see chart for details).     [name] is a 43 year old male with history of seizures who presents to the ED with right shoulder pain.  Patient with normal vitals.  No fever.  Patient with right shoulder issues for the last several weeks.  Has history of seizures and dislocations in his right shoulder.  Has had progressive difficulty with range of motion with stiffness in the morning.  No specific trauma recently.  Has not done physical therapy.  No history of surgery.  Has some decreased range of motion the right shoulder likely from frozen shoulder or from labral/rotator cuff issue.  Neurovascularly/neuromuscularly intact.  Normal neurological exam.  X-ray shows possible nondisplaced fracture of the acromium.  Patient  is not particularly tender in this area.  Does have a history of seizures while he sleeps and could have some trauma that he is unaware of.  Either way patient given information for orthopedic follow-up.  Given a sling for comfort.  Told to be nonweightbearing until he is cleared by physician.  This chart was dictated using voice recognition software.  Despite best efforts to proofread,  errors can occur which can change the documentation meaning.    Final Clinical Impressions(s) / ED Diagnoses   Final diagnoses:  Closed nondisplaced fracture of right acromial process, initial encounter    ED Discharge Orders    None       Virgina Norfolk, DO 10/28/18 1212

## 2018-10-28 NOTE — ED Notes (Signed)
Patient verbalizes understanding of discharge instructions. Opportunity for questioning and answers were provided. Armband removed by staff, pt discharged from ED ambulatory.   

## 2018-10-28 NOTE — Discharge Instructions (Signed)
As discussed, you may have a nondisplaced fracture of your acromion.  Use sling for comfort and remain nonweightbearing until you follow-up with orthopedics.

## 2018-10-28 NOTE — ED Triage Notes (Signed)
Pt has history of seizures, thinks he injured it a few months ago during one, PCP sent him here because he is having difficulty raising his arm or working.

## 2018-10-28 NOTE — ED Notes (Signed)
Patient transported to X-ray 

## 2018-10-29 LAB — LIPID PANEL
Chol/HDL Ratio: 3 ratio (ref 0.0–5.0)
Cholesterol, Total: 195 mg/dL (ref 100–199)
HDL: 64 mg/dL (ref >39)
LDL Calculated: 123 mg/dL — ABNORMAL HIGH (ref 0–99)
Triglycerides: 42 mg/dL (ref 0–149)
VLDL Cholesterol Cal: 8 mg/dL (ref 5–40)

## 2018-10-29 LAB — CMP14+EGFR
ALT: 17 IU/L (ref 0–44)
AST: 24 IU/L (ref 0–40)
Albumin/Globulin Ratio: 1.8 (ref 1.2–2.2)
Albumin: 4.4 g/dL (ref 4.0–5.0)
Alkaline Phosphatase: 46 IU/L (ref 39–117)
BUN/Creatinine Ratio: 9 (ref 9–20)
BUN: 13 mg/dL (ref 6–24)
Bilirubin Total: 0.4 mg/dL (ref 0.0–1.2)
CO2: 26 mmol/L (ref 20–29)
Calcium: 9.9 mg/dL (ref 8.7–10.2)
Chloride: 98 mmol/L (ref 96–106)
Creatinine, Ser: 1.39 mg/dL — ABNORMAL HIGH (ref 0.76–1.27)
GFR calc Af Amer: 73 mL/min/{1.73_m2} (ref >59)
GFR calc non Af Amer: 63 mL/min/{1.73_m2} (ref >59)
Globulin, Total: 2.5 g/dL (ref 1.5–4.5)
Glucose: 51 mg/dL — ABNORMAL LOW (ref 65–99)
Potassium: 4.9 mmol/L (ref 3.5–5.2)
Sodium: 140 mmol/L (ref 134–144)
Total Protein: 6.9 g/dL (ref 6.0–8.5)

## 2018-10-29 LAB — CBC
Hematocrit: 37.8 % (ref 37.5–51.0)
Hemoglobin: 12.8 g/dL — ABNORMAL LOW (ref 13.0–17.7)
MCH: 29 pg (ref 26.6–33.0)
MCHC: 33.9 g/dL (ref 31.5–35.7)
MCV: 86 fL (ref 79–97)
Platelets: 156 10*3/uL (ref 150–450)
RBC: 4.42 x10E6/uL (ref 4.14–5.80)
RDW: 13.1 % (ref 11.6–15.4)
WBC: 4.9 10*3/uL (ref 3.4–10.8)

## 2018-10-29 LAB — HEMOGLOBIN A1C
Est. average glucose Bld gHb Est-mCnc: 229 mg/dL
Hgb A1c MFr Bld: 9.6 % — ABNORMAL HIGH (ref 4.8–5.6)

## 2018-10-29 LAB — VITAMIN D 25 HYDROXY (VIT D DEFICIENCY, FRACTURES): Vit D, 25-Hydroxy: 42 ng/mL (ref 30.0–100.0)

## 2018-11-03 ENCOUNTER — Other Ambulatory Visit: Payer: Self-pay | Admitting: Nurse Practitioner

## 2018-11-03 ENCOUNTER — Telehealth: Payer: Self-pay

## 2018-11-03 NOTE — Telephone Encounter (Signed)
Patient returned call. Please follow up

## 2018-11-03 NOTE — Telephone Encounter (Signed)
CMA attempt to reach patient to inform on results.  No answer and LVM for a call back.

## 2018-11-03 NOTE — Telephone Encounter (Signed)
-----   Message from Claiborne Rigg, NP sent at 10/30/2018 12:43 PM EDT ----- Labs do not show anemia. Vitamin d level is normal. Cholesterol levels are stable. INSTRUCTIONS: Work on a low fat, heart healthy diet and avoid red meat, fried foods. junk foods, sodas, sugary drinks, unhealthy snacking, alcohol and smoking.  Drink at least 48oz of water per day and monitor your carbohydrate intake daily.

## 2018-11-03 NOTE — Telephone Encounter (Signed)
CMA spoke to patient to inform on lab results. Pt. Verifed DOB. Pt. Understood.  Pt. Went to go ED to get his Xray for his right shoulder, and would like PCP advising. Pt. Was recommend by ED to make an appt. For Orthopedic surgeon, due to no insurance pt. Wanted to see if there is any other options beside orthopedic surgery.

## 2018-11-03 NOTE — Telephone Encounter (Signed)
Continue sling and NWB for a few weeks. Will still likely need to see ortho. He needs to call them to see if they can take payment arrangements. Thanks

## 2018-11-04 NOTE — Telephone Encounter (Signed)
CMA spoke to patient to inform on PCP advising.  Pt. Verified DOB. Pt. Understood.

## 2018-11-14 ENCOUNTER — Other Ambulatory Visit: Payer: Self-pay | Admitting: Nurse Practitioner

## 2018-11-14 DIAGNOSIS — G40909 Epilepsy, unspecified, not intractable, without status epilepticus: Secondary | ICD-10-CM

## 2018-11-14 MED FILL — SIMVASTATIN 20 MG TABLET: 20 | 30 days supply | Qty: 30 | Fill #2

## 2018-11-14 NOTE — Telephone Encounter (Signed)
1) Medication(s) Requested (by name): divalproex (DEPAKOTE ER) 500 MG 24 hr tablet   2) Pharmacy of Choice: [facility] 3) Special Requests:   Approved medications will be sent to the pharmacy, we will reach out if there is an issue.  Requests made after 3pm may not be addressed until the following business day!  If a patient is unsure of the name of the medication(s) please note and ask patient to call back when they are able to provide all info, do not send to responsible party until all information is available!

## 2018-11-17 MED FILL — DIVALPROEX SOD ER 500 MG TA: 500 | 30 days supply | Qty: 90 | Fill #0

## 2018-11-17 NOTE — Telephone Encounter (Signed)
CMA attempt to reach patient to inform he needs to come in for labs per PCP. No answer and left a VM.

## 2018-12-15 ENCOUNTER — Ambulatory Visit: Payer: Self-pay | Admitting: Neurology

## 2018-12-15 ENCOUNTER — Encounter: Payer: Self-pay | Admitting: Neurology

## 2018-12-15 ENCOUNTER — Other Ambulatory Visit: Payer: Self-pay

## 2018-12-15 ENCOUNTER — Telehealth (INDEPENDENT_AMBULATORY_CARE_PROVIDER_SITE_OTHER): Payer: Self-pay | Admitting: Neurology

## 2018-12-15 VITALS — Ht 72.0 in | Wt 180.0 lb

## 2018-12-15 DIAGNOSIS — G40309 Generalized idiopathic epilepsy and epileptic syndromes, not intractable, without status epilepticus: Secondary | ICD-10-CM

## 2018-12-15 MED ORDER — DIVALPROEX SODIUM ER 500 MG PO TB24: ORAL_TABLET | ORAL | 3 refills | Status: DC

## 2018-12-15 MED FILL — DIVALPROEX SOD ER 500 MG TA: 500 | 30 days supply | Qty: 90 | Fill #0

## 2018-12-15 NOTE — Progress Notes (Signed)
Virtual Visit via Video Note The purpose of this virtual visit is to provide medical care while limiting exposure to the novel coronavirus.    Consent was obtained for video visit:  Yes.   Answered questions that patient had about telehealth interaction:  Yes.   I discussed the limitations, risks, security and privacy concerns of performing an evaluation and management service by telemedicine. I also discussed with the patient that there may be a patient responsible charge related to this service. The patient expressed understanding and agreed to proceed.  Pt location: Home Physician Location: office Name of referring provider:  Claiborne RiggFleming, Zelda W, NP I connected with [name] at patients initiation/request on 12/15/2018 at 11:30 AM EDT by video enabled telemedicine application and verified that I am speaking with the correct person using two identifiers. Pt MRN:  [id number] Pt DOB:  05/11/1976 Video Participants:  [name]   History of Present Illness:  The patient was last seen in December 2019 for seizures and memory loss. He is taking Depakote ER 1500mg  qhs and states that as long as he is taking medications, he is seizure-free. He had one nocturnal seizure since his last visit that occurred around February 2020, he woke up with tongue bite and right shoulder fracture. He states he was waiting for his medication refill and had run out of Depakote. He denies any staring/unresponsive episodes, gaps in time, myoclonic jerks, focal numbness/tingling/weakness. He feels his memory could be better, memory issues do not affect daily activities, he denies forgetting medications or bills. He does not drive. He has seen [facility] and started on hydroxyzine and olanzapine, since starting them he has noticed a constant low grade tremor in both hands. He denies any headaches, dizziness, no falls. He has not done the MRI brain or EEG yet.   Laboratory Data:  Lab Results  Component Value Date    VALPROATE 48.9 (L) 06/23/2018     History on Initial Assessment 06/23/2018: This is a pleasant 43 year old left-handed man with a history of type I diabetes, hypertension, presenting to establish care for seizures. He reports that seizures started around age 43. All his seizures appear nocturnal, usually occurring early in the morning where he wakes up with a tongue bite and incontinence, or his mother wakes him up. He recalls the worst seizure at age 43 when he bit his tongue so bad and woke up in the ER with right shoulder ?subluxation/dislocation. He states he was told it was not fractured. He has been taking Depakote ER 1500mg  qhs for several years without side effects and does not recall trying other AEDs. His longest seizure-free interval has been 3 years. Last seizure was 04/07/18, he woke up with bedsheets wet on the floor, lightheaded. Previous to this, he recalls a seizure 2 months prior. He lives alone and has not noticed any recent episodes of gaps in time, but in the past would be "out of space" when his sugars would go to 30-40s. Seizure triggers include exhaustion, sleep deprivation, heat, and low blood sugar (30-40). He reports glucose levels have been okay recently. He noticed some hand jerking when younger, none recently. He tastes blood after seizures. He has not been told of any staring/unresponsive episodes. He does not usually have headaches, but recently has been having headaches once a week with diffuse thumping where he has to focus his eyes. No nausea/vomiting, visual obscurations. He denies any dizziness, diplopia, dysarthria/dysphagia, neck/back pain, bowel/bladder dysfunction. He reinjured his right  shoulder a few months ago and now has a sharp sensation and tingling when he pulls his right shoulder back or lifts his arms. He started to notice memory changes a few months ago, he has to write things down or put them on a calendar. MMSE 24/30 at PCP office in October 2019. He states his  stress level has been up since April, worse with the shoulder injury. He denies missing medications or bills. He is planning to go back to school. He has some sleep issues which Trazodone helps with.   Epilepsy Risk Factors:  He is unsure if his mother and a paternal cousin have seizures (he has not personally witnessed any). He had a head injury at age 43 where he lost consciousness and has a scar on the right brow. Otherwise he had a normal birth and early development.  There is no history of febrile convulsions, CNS infections such as meningitis/encephalitis, neurosurgical procedures.  Laboratory Data: Lab Results  Component Value Date   VALPROATE 48.9 (L) 06/23/2018      Current Outpatient Medications on File Prior to Visit  Medication Sig Dispense Refill  . albuterol (PROVENTIL HFA;VENTOLIN HFA) 108 (90 Base) MCG/ACT inhaler Inhale 1-2 puffs into the lungs every 6 (six) hours as needed for wheezing or shortness of breath. 1 Inhaler 0  . amLODipine (NORVASC) 5 MG tablet Take 1 tablet (5 mg total) by mouth daily. 90 tablet 3  . aspirin 81 MG tablet Take 81 mg by mouth daily.    Marland Kitchen. glucose blood (TRUE METRIX BLOOD GLUCOSE TEST) test strip Use as instructed 100 each 12  . insulin aspart (NOVOLOG) 100 UNIT/ML injection Inject 1-4 Units into the skin 3 (three) times daily before meals. If sugar > 120 30 mL 0  . Insulin Glargine (LANTUS SOLOSTAR) 100 UNIT/ML Solostar Pen INJECT 34 UNITS INTO THE SKIN DAILY. MAY INCREASE LANTUS BY 2 UNITS EVERY 3 DAYS UNTIL FASTING BLOOD SUGAR IS 100-130. 15 mL 1  . Insulin Pen Needle (B-D UF III MINI PEN NEEDLES) 31G X 5 MM MISC Insert into the skin as prescribed 90 each 1  . Insulin Syringe-Needle U-100 (B-D INS SYRINGE 0.5CC/31GX5/16) 31G X 5/16" 0.5 ML MISC 1 each by Does not apply route 3 (three) times daily. 90 each 11  . lisinopril (ZESTRIL) 20 MG tablet Take 1 tablet (20 mg total) by mouth every morning. 90 tablet 3  . Omega-3 Fatty Acids (FISH OIL PO) Take  1 capsule by mouth daily.    . simvastatin (ZOCOR) 20 MG tablet Take 1 tablet (20 mg total) by mouth daily. 90 tablet 3  . traZODone (DESYREL) 50 MG tablet Take 1 tablet (50 mg total) by mouth at bedtime as needed for sleep. 30 tablet 3  . TRUEPLUS LANCETS 28G MISC 1 each by Does not apply route 3 (three) times daily. 100 each 11   No current facility-administered medications on file prior to visit.      Observations/Objective:   Vitals:   12/15/18 0919  Weight: 180 lb (81.6 kg)  Height: 6' (1.829 m)   GEN:  The patient appears stated age and is in NAD.  Neurological examination: Patient is awake, alert, oriented x 3. No aphasia or dysarthria. Intact fluency and comprehension. Remote and recent memory intact. Able to name and repeat. Cranial nerves: Extraocular movements intact with no nystagmus. No facial asymmetry. Motor: moves all extremities symmetrically, at least anti-gravity x 4. No incoordination on finger to nose testing. Gait: narrow-based and steady,  able to tandem walk adequately. Negative Romberg test.   Assessment and Plan:   This is a pleasant 43 yo LH man with a history of type I diabetes, hypertension, and seizures suggestive of primary generalized epilepsy. It appears he has primarily nocturnal seizures, last seizure in 08/2018. He reports seizure occurred due to missing medication and that if he does not run out of medication, seizures are controlled. He was reporting memory changes on last visit but feels this has been stable and would like to hold off on MRI brain and EEG for now. Refills for Depakote ER 500mg  3 tabs qhs (1500mg  qhs) were sent. He is aware of [place] driving lawsto stop driving after a seizure, until 6 months seizure-free. He will follow-up in 6 months and knows to call for any changes.    Follow Up Instructions:   -I discussed the assessment and treatment plan with the patient. The patient was provided an opportunity to ask questions and all were answered.  The patient agreed with the plan and demonstrated an understanding of the instructions.   The patient was advised to call back or seek an in-person evaluation if the symptoms worsen or if the condition fails to improve as anticipated.    Cameron Sprang, MD

## 2018-12-16 MED FILL — $LANTUS SOLOSTAR 100 UNITS/: 100 | 35 days supply | Qty: 12 | Fill #0

## 2019-01-13 MED FILL — LISINOPRIL 20 MG TABLET: 20 | 30 days supply | Qty: 30 | Fill #1

## 2019-01-13 MED FILL — ?SIMVASTATIN 20MG TABLE: 20 | 30 days supply | Qty: 30 | Fill #3

## 2019-01-14 ENCOUNTER — Telehealth: Payer: Self-pay | Admitting: Nurse Practitioner

## 2019-01-14 MED FILL — traZODone HCL 50 MG TABS: 50 | 30 days supply | Qty: 30 | Fill #1

## 2019-01-14 NOTE — Telephone Encounter (Signed)
1) Medication(s) Requested (by name): Lisinopril Simvastatin trazodone 2) Pharmacy of Choice: [facility] 3) Special Requests:   Approved medications will be sent to the pharmacy, we will reach out if there is an issue.  Requests made after 3pm may not be addressed until the following business day!  If a patient is unsure of the name of the medication(s) please note and ask patient to call back when they are able to provide all info, do not send to responsible party until all information is available!

## 2019-01-14 NOTE — Telephone Encounter (Signed)
CMA spoke to pharmacy and verified that all his medication is ready for him to pick up.  CMA inform patient he can stop by the pharmacy to get his medication.  Pt. Understood.

## 2019-01-23 MED FILL — $LANTUS SOLOSTAR 100 UNITS/: 100 | 35 days supply | Qty: 12 | Fill #1

## 2019-01-28 MED FILL — ?DIVALPROEX SOD ER 500 MG T: 500 | 30 days supply | Qty: 90 | Fill #1

## 2019-02-19 ENCOUNTER — Other Ambulatory Visit: Payer: Self-pay | Admitting: Nurse Practitioner

## 2019-02-19 DIAGNOSIS — E108 Type 1 diabetes mellitus with unspecified complications: Secondary | ICD-10-CM

## 2019-02-19 MED FILL — TRUE METRIX TEST STRIP: 30 days supply | Qty: 100 | Fill #0

## 2019-02-19 MED FILL — LISINOPRIL 20 MG TABLET: 20 | 30 days supply | Qty: 30 | Fill #2

## 2019-02-19 MED FILL — traZODone HCL 50 MG TABS: 50 | 30 days supply | Qty: 30 | Fill #1

## 2019-02-19 MED FILL — ?AMLODIPINE BESYLATE 5MG TA: 5 | 30 days supply | Qty: 30 | Fill #1

## 2019-02-19 MED FILL — ?SIMVASTATIN 20MG TABLE: 20 | 30 days supply | Qty: 30 | Fill #3

## 2019-02-19 MED FILL — $LANTUS SOLOSTAR 100 UNITS/: 100 | 17 days supply | Qty: 6 | Fill #2

## 2019-02-21 ENCOUNTER — Encounter (HOSPITAL_COMMUNITY): Payer: Self-pay | Admitting: Emergency Medicine

## 2019-02-21 ENCOUNTER — Emergency Department (HOSPITAL_COMMUNITY)
Admission: EM | Admit: 2019-02-21 | Discharge: 2019-02-21 | Disposition: A | Discharge status: Home / Self Care | Payer: Self-pay | Attending: Emergency Medicine | Admitting: Emergency Medicine

## 2019-02-21 ENCOUNTER — Other Ambulatory Visit: Payer: Self-pay

## 2019-02-21 DIAGNOSIS — E108 Type 1 diabetes mellitus with unspecified complications: Secondary | ICD-10-CM

## 2019-02-21 DIAGNOSIS — Z7982 Long term (current) use of aspirin: Secondary | ICD-10-CM | POA: Insufficient documentation

## 2019-02-21 DIAGNOSIS — F4310 Post-traumatic stress disorder, unspecified: Secondary | ICD-10-CM | POA: Insufficient documentation

## 2019-02-21 DIAGNOSIS — Z79899 Other long term (current) drug therapy: Secondary | ICD-10-CM | POA: Insufficient documentation

## 2019-02-21 DIAGNOSIS — E10641 Type 1 diabetes mellitus with hypoglycemia with coma: Secondary | ICD-10-CM | POA: Insufficient documentation

## 2019-02-21 DIAGNOSIS — E162 Hypoglycemia, unspecified: Secondary | ICD-10-CM

## 2019-02-21 DIAGNOSIS — R569 Unspecified convulsions: Secondary | ICD-10-CM | POA: Insufficient documentation

## 2019-02-21 DIAGNOSIS — I10 Essential (primary) hypertension: Secondary | ICD-10-CM | POA: Insufficient documentation

## 2019-02-21 LAB — BASIC METABOLIC PANEL
Anion gap: 9 (ref 5–15)
BUN: 17 mg/dL (ref 6–20)
CO2: 26 mmol/L (ref 22–32)
Calcium: 9.5 mg/dL (ref 8.9–10.3)
Chloride: 106 mmol/L (ref 98–111)
Creatinine, Ser: 1.32 mg/dL — ABNORMAL HIGH (ref 0.61–1.24)
GFR calc Af Amer: >60 mL/min (ref >60)
GFR calc non Af Amer: >60 mL/min (ref >60)
Glucose, Bld: 93 mg/dL (ref 70–99)
Potassium: 3.7 mmol/L (ref 3.5–5.1)
Sodium: 141 mmol/L (ref 135–145)

## 2019-02-21 LAB — CBG MONITORING, ED
Glucose-Capillary: 121 mg/dL — ABNORMAL HIGH (ref 70–99)
Glucose-Capillary: 94 mg/dL (ref 70–99)
Glucose-Capillary: 240 mg/dL — ABNORMAL HIGH (ref 70–99)

## 2019-02-21 LAB — URINALYSIS, ROUTINE W REFLEX MICROSCOPIC
Bacteria, UA: NONE SEEN
Bilirubin Urine: NEGATIVE
Glucose, UA: NEGATIVE mg/dL
Hgb urine dipstick: NEGATIVE
Ketones, ur: NEGATIVE mg/dL
Leukocytes,Ua: NEGATIVE
Nitrite: NEGATIVE
Protein, ur: 100 mg/dL — AB
Specific Gravity, Urine: 1.014 (ref 1.005–1.030)
pH: 6 (ref 5.0–8.0)

## 2019-02-21 LAB — CBC
HCT: 42.4 % (ref 39.0–52.0)
Hemoglobin: 14 g/dL (ref 13.0–17.0)
MCH: 29 pg (ref 26.0–34.0)
MCHC: 33 g/dL (ref 30.0–36.0)
MCV: 87.8 fL (ref 80.0–100.0)
Platelets: 168 10*3/uL (ref 150–400)
RBC: 4.83 MIL/uL (ref 4.22–5.81)
RDW: 12.8 % (ref 11.5–15.5)
WBC: 5.3 10*3/uL (ref 4.0–10.5)
nRBC: 0 % (ref 0.0–0.2)

## 2019-02-21 LAB — VALPROIC ACID LEVEL: Valproic Acid Lvl: 110 ug/mL — ABNORMAL HIGH (ref 50.0–100.0)

## 2019-02-21 MED ORDER — LANTUS SOLOSTAR 100 UNIT/ML ~~LOC~~ SOPN: PEN_INJECTOR | SUBCUTANEOUS | 1 refills | Status: DC

## 2019-02-21 NOTE — ED Notes (Signed)
Pt given food and beverage, encouraged to eat

## 2019-02-21 NOTE — ED Provider Notes (Signed)
[facility] Provider Note   CSN: [id number] Arrival date & time: 02/21/19  [id number]    History   Chief Complaint Chief Complaint  Patient presents with  . Hypoglycemia    HPI [name] is a 43 y.o. male.     HPI   43yo male with history of DM type 1, htn, seizure disorder, PTSD, presents with concern for episode of hypoglycemia and seizure.  Is on lantus, reports taking 34U last night, glucose was in 100s before bed. Did not wake up normally and his mother found him having a seizure. Glucose at home was 33. CBB repeated 49, failed IV access with EMS but they were able to fee dhim PBJ and his glucose is normal on arrival, and he is alert and oriented.  He reports he is otherwise in a normal state of health, denies recent nausea, vomiting, diarrhea.  Denies any increase in his Lantus.  Reports that he has decreased it from 36 units to 34.  Reports that he sometimes will have racing thoughts and stay up late with difficulty sleeping at night, and feels that this will sometimes burn more glucose then when he is just sleeping normally.  He otherwise has been eating and drinking normally, denies any symptoms of acute illness.  Past Medical History:  Diagnosis Date  . Diabetes mellitus   . Hypertension   . Seizures [facility](HCC)     Patient Active Problem List   Diagnosis Date Noted  . 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 Knee pain 03/16/2016  . Scrotal pain 01/13/2016  . Seizure disorder (HCC) 03/10/2015  . 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 UNSPECIFIED VITAMIN D DEFICIENCY 06/16/2009  . PTSD (post-traumatic stress disorder) 03/26/2008  . Essential hypertension, benign 03/24/2008  . SLEEP DISORDER 03/24/2008  . SLEEP DEPRIVATION 02/09/2008  . DM (diabetes mellitus), type 1 with complications (HCC) 04/04/2007    Past Surgical History:   Procedure Laterality Date  . HERNIA REPAIR          Home Medications    Prior to Admission medications   Medication Sig Start Date End Date Taking? Authorizing Provider  albuterol (PROVENTIL HFA;VENTOLIN HFA) 108 (90 Base) MCG/ACT inhaler Inhale 1-2 puffs into the lungs every 6 (six) hours as needed for wheezing or shortness of breath. 01/25/18   Wendling, Jilda RocheNicholas Paul, DO  amLODipine (NORVASC) 5 MG tablet Take 1 tablet (5 mg total) by mouth daily. 10/27/18   Claiborne RiggFleming, Zelda W, NP  aspirin 81 MG tablet Take 81 mg by mouth daily.    [provider]  divalproex (DEPAKOTE ER) 500 MG 24 hr tablet Take 3 tablets every night 12/15/18   Van ClinesAquino, Karen M, MD  glucose blood test strip Use as instructed. MUST MAKE APPT FOR FURTHER REFILLS 02/19/19   Claiborne RiggFleming, Zelda W, NP  insulin aspart (NOVOLOG) 100 UNIT/ML injection Inject 1-4 Units into the skin 3 (three) times daily before meals. If sugar > 120 10/27/18   Claiborne RiggFleming, Zelda W, NP  Insulin Glargine (LANTUS SOLOSTAR) 100 UNIT/ML Solostar Pen INJECT 32 UNITS INTO THE SKIN DAILY. MAY INCREASE LANTUS BY 2 UNITS EVERY 3 DAYS UNTIL FASTING BLOOD SUGAR IS 100-130. 02/21/19   Alvira MondaySchlossman, Gradie Ohm, MD  Insulin Pen Needle (B-D UF III MINI PEN NEEDLES) 31G X 5 MM MISC Insert into the skin as prescribed 10/27/18   Claiborne RiggFleming, Zelda W, NP  Insulin Syringe-Needle U-100 (B-D  INS SYRINGE 0.5CC/31GX5/16) 31G X 5/16" 0.5 ML MISC 1 each by Does not apply route 3 (three) times daily. 10/27/18   Claiborne RiggFleming, Zelda W, NP  lisinopril (ZESTRIL) 20 MG tablet Take 1 tablet (20 mg total) by mouth every morning. 10/27/18   Claiborne RiggFleming, Zelda W, NP  Omega-3 Fatty Acids (FISH OIL PO) Take 1 capsule by mouth daily.    [provider]  simvastatin (ZOCOR) 20 MG tablet Take 1 tablet (20 mg total) by mouth daily. 04/09/18   Anders SimmondsMcClung, Angela M, PA-C  traZODone (DESYREL) 50 MG tablet Take 1 tablet (50 mg total) by mouth at bedtime as needed for sleep. 05/26/18   Fulp, Cammie, MD  TRUEPLUS LANCETS  28G MISC 1 each by Does not apply route 3 (three) times daily. 03/10/15   Dessa PhiFunches, Josalyn, MD    Family History Family History  Problem Relation Age of Onset  . Hypertension Mother   . Diabetes Paternal Uncle     Social History Social History   Tobacco Use  . Smoking status: Never Smoker  . Smokeless tobacco: Never Used  Substance Use Topics  . Alcohol use: Yes    Alcohol/week: 1.0 standard drinks    Types: 1 Glasses of wine per week    Comment: rarely  . Drug use: No     Allergies   Ziprasidone mesylate and Geodon [ziprasidone hcl]   Review of Systems Review of Systems  Constitutional: Negative for fever.  HENT: Negative for sore throat.   Eyes: Negative for visual disturbance.  Respiratory: Negative for shortness of breath.   Cardiovascular: Negative for chest pain.  Gastrointestinal: Negative for abdominal pain, diarrhea, nausea and vomiting.  Genitourinary: Negative for difficulty urinating.  Musculoskeletal: Negative for back pain.  Skin: Negative for rash.  Neurological: Positive for seizures. Negative for syncope and headaches.  Psychiatric/Behavioral: Positive for sleep disturbance.     Physical Exam Updated Vital Signs BP 133/77   Pulse (!) 59   Temp 98.3 F (36.8 C) (Oral)   Resp 16   Ht 6' (1.829 m)   Wt 86.2 kg   SpO2 100%   BMI 25.77 kg/m   Physical Exam Vitals signs and nursing note reviewed.  Constitutional:      General: He is not in acute distress.    Appearance: He is well-developed. He is not diaphoretic.  HENT:     Head: Normocephalic and atraumatic.  Eyes:     Conjunctiva/sclera: Conjunctivae normal.  Neck:     Musculoskeletal: Normal range of motion.  Cardiovascular:     Rate and Rhythm: Normal rate and regular rhythm.  Pulmonary:     Effort: Pulmonary effort is normal. No respiratory distress.  Abdominal:     General: There is no distension.     Palpations: Abdomen is soft.     Tenderness: There is no abdominal  tenderness. There is no guarding.  Skin:    General: Skin is warm and dry.  Neurological:     General: No focal deficit present.     Mental Status: He is alert and oriented to person, place, and time.      ED Treatments / Results  Labs (all labs ordered are listed, but only abnormal results are displayed) Labs Reviewed  BASIC METABOLIC PANEL - Abnormal; Notable for the following components:      Result Value   Creatinine, Ser 1.32 (*)    All other components within normal limits  URINALYSIS, ROUTINE W REFLEX MICROSCOPIC - Abnormal; Notable for the  following components:   Protein, ur 100 (*)    All other components within normal limits  VALPROIC ACID LEVEL - Abnormal; Notable for the following components:   Valproic Acid Lvl 110 (*)    All other components within normal limits  CBG MONITORING, ED - Abnormal; Notable for the following components:   Glucose-Capillary 121 (*)    All other components within normal limits  CBG MONITORING, ED - Abnormal; Notable for the following components:   Glucose-Capillary 240 (*)    All other components within normal limits  CBC  CBG MONITORING, ED    EKG EKG Interpretation  Date/Time:  Saturday February 21 2019 09:09:19 EDT Ventricular Rate:  60 PR Interval:    QRS Duration: 87 QT Interval:  579 QTC Calculation: 579 R Axis:   18 Text Interpretation:  Sinus rhythm Abnormal R-wave progression, early transition LVH with secondary repolarization abnormality Prolonged QT interval Baseline wander in lead(s) I II aVR aVF Nonspecific ST changes anterolateral leads Confirmed by Gareth Morgan [phone]) on 02/21/2019 9:39:48 AM   Radiology No results found.  Procedures Procedures (including critical care time)  Medications Ordered in ED Medications - No data to display   Initial Impression / Assessment and Plan / ED Course  I have reviewed the triage vital signs and the nursing notes.  Pertinent labs & imaging results that were  available during my care of the patient were reviewed by me and considered in my medical decision making (see chart for details).        43yo male with history of DM type 1, htn, seizure disorder, PTSD, presents with concern for episode of hypoglycemia and seizure.  EKG shows no significant acute findings.  Glucose on arrival to the emergency department is 84, increased from 40 which is what it was at home.  He does have a history of seizure disorder, Depakote level ordered and is 110.  Suspect seizure today was not secondary to his underlying seizure disorder, but due to his severe hypoglycemia.  Will recommend patient follow-up with his neurologist regarding his Depakote level.  Labs otherwise do not show any significant electrolyte abnormalities or change in renal function.  On arrival to the emergency department, he is awake, alert, tolerating p.o.  His glucose was rechecked and was 240 after continuing to eat in the emergency department.  Feel he is stable for discharge home at this time with a decrease in his Lantus to 30-32 units and close follow-up with his primary care physician.  He has not had events like this for some time on current dosing, and think likely exacerbated by lack of sleep last night.  Patient discharged in stable condition with understanding of reasons to return.   Final Clinical Impressions(s) / ED Diagnoses   Final diagnoses:  Hypoglycemia  Seizure [facility])    ED Discharge Orders         Ordered    Insulin Glargine (LANTUS SOLOSTAR) 100 UNIT/ML Solostar Pen     02/21/19 1041           Gareth Morgan, MD 02/21/19 1102

## 2019-02-21 NOTE — ED Notes (Signed)
Patient verbalizes understanding of discharge instructions . Opportunity for questions and answers were provided . Armband removed by staff ,Pt discharged from ED. W/C  offered at D/C  and Declined W/C at D/C and was escorted to lobby by RN.  

## 2019-02-21 NOTE — ED Triage Notes (Signed)
Per EMS- family called out for "seizure like activity" pt was alert and awake upon arrival. CBG 33. Pt ate PBJ milk. CBG now 49. No IV access X 3.

## 2019-02-24 ENCOUNTER — Other Ambulatory Visit: Payer: Self-pay | Admitting: Pharmacist

## 2019-02-24 MED ORDER — INSULIN LISPRO (1 UNIT DIAL) 100 UNIT/ML (KWIKPEN): PEN_INJECTOR | SUBCUTANEOUS | 0 refills | Status: DC

## 2019-02-24 MED FILL — ?HUMALOG 100 UNITS/ML KWIKP: 100 | 25 days supply | Qty: 3 | Fill #0

## 2019-03-04 MED FILL — DIVALPROEX SOD ER 500 MG TA: 500 | 30 days supply | Qty: 90 | Fill #2

## 2019-03-05 MED FILL — ?HUMALOG 100 UNITS/ML KWIKP: 100 | 25 days supply | Qty: 3 | Fill #0

## 2019-03-18 MED FILL — $LANTUS SOLOSTAR 100 UNITS/: 100 | 35 days supply | Qty: 12 | Fill #0

## 2019-03-21 ENCOUNTER — Emergency Department (HOSPITAL_COMMUNITY)
Admission: EM | Admit: 2019-03-21 | Discharge: 2019-03-21 | Disposition: A | Discharge status: Home / Self Care | Payer: Self-pay | Attending: Emergency Medicine | Admitting: Emergency Medicine

## 2019-03-21 ENCOUNTER — Encounter (HOSPITAL_COMMUNITY): Payer: Self-pay | Admitting: *Deleted

## 2019-03-21 ENCOUNTER — Other Ambulatory Visit: Payer: Self-pay

## 2019-03-21 ENCOUNTER — Emergency Department (HOSPITAL_COMMUNITY): Payer: Self-pay

## 2019-03-21 DIAGNOSIS — I10 Essential (primary) hypertension: Secondary | ICD-10-CM | POA: Insufficient documentation

## 2019-03-21 DIAGNOSIS — Z79899 Other long term (current) drug therapy: Secondary | ICD-10-CM | POA: Insufficient documentation

## 2019-03-21 DIAGNOSIS — E10649 Type 1 diabetes mellitus with hypoglycemia without coma: Secondary | ICD-10-CM | POA: Insufficient documentation

## 2019-03-21 DIAGNOSIS — E162 Hypoglycemia, unspecified: Secondary | ICD-10-CM

## 2019-03-21 DIAGNOSIS — Z7982 Long term (current) use of aspirin: Secondary | ICD-10-CM | POA: Insufficient documentation

## 2019-03-21 LAB — CBC WITH DIFFERENTIAL/PLATELET
Abs Immature Granulocytes: 0.04 10*3/uL (ref 0.00–0.07)
Basophils Absolute: 0 10*3/uL (ref 0.0–0.1)
Basophils Relative: 0 %
Eosinophils Absolute: 0 10*3/uL (ref 0.0–0.5)
Eosinophils Relative: 0 %
HCT: 39.8 % (ref 39.0–52.0)
Hemoglobin: 12.9 g/dL — ABNORMAL LOW (ref 13.0–17.0)
Immature Granulocytes: 1 %
Lymphocytes Relative: 18 %
Lymphs Abs: 1.6 10*3/uL (ref 0.7–4.0)
MCH: 29 pg (ref 26.0–34.0)
MCHC: 32.4 g/dL (ref 30.0–36.0)
MCV: 89.4 fL (ref 80.0–100.0)
Monocytes Absolute: 0.6 10*3/uL (ref 0.1–1.0)
Monocytes Relative: 8 %
Neutro Abs: 6.2 10*3/uL (ref 1.7–7.7)
Neutrophils Relative %: 73 %
Platelets: 154 10*3/uL (ref 150–400)
RBC: 4.45 MIL/uL (ref 4.22–5.81)
RDW: 12.9 % (ref 11.5–15.5)
WBC: 8.5 10*3/uL (ref 4.0–10.5)
nRBC: 0 % (ref 0.0–0.2)

## 2019-03-21 LAB — URINALYSIS, ROUTINE W REFLEX MICROSCOPIC
Bacteria, UA: NONE SEEN
Bilirubin Urine: NEGATIVE
Glucose, UA: 50 mg/dL — AB
Hgb urine dipstick: NEGATIVE
Ketones, ur: NEGATIVE mg/dL
Leukocytes,Ua: NEGATIVE
Nitrite: NEGATIVE
Protein, ur: 100 mg/dL — AB
Specific Gravity, Urine: 1.009 (ref 1.005–1.030)
pH: 6 (ref 5.0–8.0)

## 2019-03-21 LAB — BASIC METABOLIC PANEL
Anion gap: 13 (ref 5–15)
BUN: 18 mg/dL (ref 6–20)
CO2: 21 mmol/L — ABNORMAL LOW (ref 22–32)
Calcium: 8.9 mg/dL (ref 8.9–10.3)
Chloride: 104 mmol/L (ref 98–111)
Creatinine, Ser: 1.24 mg/dL (ref 0.61–1.24)
GFR calc Af Amer: >60 mL/min (ref >60)
GFR calc non Af Amer: >60 mL/min (ref >60)
Glucose, Bld: 134 mg/dL — ABNORMAL HIGH (ref 70–99)
Potassium: 3.7 mmol/L (ref 3.5–5.1)
Sodium: 138 mmol/L (ref 135–145)

## 2019-03-21 NOTE — ED Provider Notes (Signed)
[facility]-EMERGENCY DEPT Provider Note   CSN: [id number] Arrival date & time: 03/21/19  1123     History   Chief Complaint Chief Complaint  Patient presents with  . Hypoglycemia    HPI [name] is a 43 y.o. male with a history of IDDM (on lantus 32 units bedtime, novalog 1-3 units TID before meals), seizure disorder on Depakote, hypertension, presenting to the emergency department with complaint of hypoglycemia.  The patient reports that he took his usual dose of Lantus yesterday evening which was 30 units before bedtime.  He reports he woke up in the middle of the night and "felt like my sugars were too high" and checked his sugars, at approximately 1 or 2 AM, and found him to be "high." He then gave himself an extra dose of insulin 3 units.  This morning he woke up feeling quite weak and found his sugar to be in the 30s.  His mother called EMS.  The patient was given glucagon in route as well as 100 mL's of D10.  And now feels improved.  He has not eaten anything yet today. He denies having a seizure this morning.  He remembers everything that happened.  Reports his Lantus was decreased on 36 units to 32 units approximately 2 months ago.  He has not had any issues with hypoglycemia except for the single incident on August 15 for which he presented to the ED.  He denies any fevers, cough, chills, myalgias, dysuria, hematuria, or any other infectious symptoms.  He says he has been in his usual state of health this week.  The patient was seen in our emergency department on April 15 for similar complaint of hypoglycemia.  He last had a seizure on 8/15, at which time his depakote level was checked and was 110.     HPI  Past Medical History:  Diagnosis Date  . Diabetes mellitus   . Hypertension   . Seizures [facility](HCC)     Patient Active Problem List   Diagnosis Date Noted  . 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 Knee pain  03/16/2016  . Scrotal pain 01/13/2016  . Seizure disorder (HCC) 03/10/2015  . 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 UNSPECIFIED VITAMIN D DEFICIENCY 06/16/2009  . PTSD (post-traumatic stress disorder) 03/26/2008  . Essential hypertension, benign 03/24/2008  . SLEEP DISORDER 03/24/2008  . SLEEP DEPRIVATION 02/09/2008  . DM (diabetes mellitus), type 1 with complications (HCC) 04/04/2007    Past Surgical History:  Procedure Laterality Date  . HERNIA REPAIR          Home Medications    Prior to Admission medications   Medication Sig Start Date End Date Taking? Authorizing Provider  albuterol (PROVENTIL HFA;VENTOLIN HFA) 108 (90 Base) MCG/ACT inhaler Inhale 1-2 puffs into the lungs every 6 (six) hours as needed for wheezing or shortness of breath. 01/25/18   Wendling, Jilda RocheNicholas Paul, DO  amLODipine (NORVASC) 5 MG tablet Take 1 tablet (5 mg total) by mouth daily. 10/27/18   Claiborne RiggFleming, Zelda W, NP  aspirin 81 MG tablet Take 81 mg by mouth daily.    [provider]  divalproex (DEPAKOTE ER) 500 MG 24 hr tablet Take 3 tablets every night 12/15/18   Van ClinesAquino, Karen M, MD  glucose blood test strip Use as instructed. MUST MAKE APPT FOR FURTHER REFILLS 02/19/19   Claiborne RiggFleming, Zelda W, NP  Insulin Glargine (LANTUS  SOLOSTAR) 100 UNIT/ML Solostar Pen INJECT 32 UNITS INTO THE SKIN DAILY. MAY INCREASE LANTUS BY 2 UNITS EVERY 3 DAYS UNTIL FASTING BLOOD SUGAR IS 100-130. 02/21/19   Gareth Morgan, MD  insulin lispro (HUMALOG KWIKPEN) 100 UNIT/ML KwikPen Inject 1-4 Units into the skin 3 (three) times daily before meals. If sugar > 120 02/24/19   Charlott Rakes, MD  Insulin Pen Needle (B-D UF III MINI PEN NEEDLES) 31G X 5 MM MISC Insert into the skin as prescribed 10/27/18   Gildardo Pounds, NP  Insulin Syringe-Needle U-100 (B-D INS SYRINGE 0.5CC/31GX5/16) 31G X 5/16" 0.5 ML MISC 1 each by Does not apply route 3 (three) times daily.  10/27/18   Gildardo Pounds, NP  lisinopril (ZESTRIL) 20 MG tablet Take 1 tablet (20 mg total) by mouth every morning. 10/27/18   Gildardo Pounds, NP  Omega-3 Fatty Acids (FISH OIL PO) Take 1 capsule by mouth daily.    [provider]  simvastatin (ZOCOR) 20 MG tablet Take 1 tablet (20 mg total) by mouth daily. 04/09/18   Argentina Donovan, PA-C  traZODone (DESYREL) 50 MG tablet Take 1 tablet (50 mg total) by mouth at bedtime as needed for sleep. 05/26/18   Fulp, Cammie, MD  TRUEPLUS LANCETS 28G MISC 1 each by Does not apply route 3 (three) times daily. 03/10/15   Boykin Nearing, MD    Family History Family History  Problem Relation Age of Onset  . Hypertension Mother   . Diabetes Paternal Uncle     Social History Social History   Tobacco Use  . Smoking status: Never Smoker  . Smokeless tobacco: Never Used  Substance Use Topics  . Alcohol use: Yes    Alcohol/week: 1.0 standard drinks    Types: 1 Glasses of wine per week    Comment: rarely  . Drug use: No     Allergies   Ziprasidone mesylate and Geodon [ziprasidone hcl]   Review of Systems Review of Systems  Constitutional: Negative for chills and fever.  HENT: Negative for ear pain and sore throat.   Eyes: Negative for pain and visual disturbance.  Respiratory: Negative for cough and shortness of breath.   Cardiovascular: Negative for chest pain and palpitations.  Gastrointestinal: Negative for abdominal pain, nausea and vomiting.  Genitourinary: Negative for dysuria, frequency and hematuria.  Musculoskeletal: Negative for arthralgias and back pain.  Skin: Negative for pallor and rash.  Neurological: Positive for weakness and light-headedness. Negative for seizures and syncope.  Psychiatric/Behavioral: Negative for agitation and behavioral problems.  All other systems reviewed and are negative.    Physical Exam Updated Vital Signs BP (!) 145/82 (BP Location: Right Arm)   Pulse (!) 57   Temp 97.7 F (36.5  C) (Oral)   Resp 18   Ht 6' (1.829 m)   Wt 84.2 kg   SpO2 100%   BMI 25.17 kg/m   Physical Exam Vitals signs and nursing note reviewed.  Constitutional:      Appearance: He is well-developed.  HENT:     Head: Normocephalic and atraumatic.  Eyes:     Conjunctiva/sclera: Conjunctivae normal.  Neck:     Musculoskeletal: Neck supple.  Cardiovascular:     Rate and Rhythm: Normal rate and regular rhythm.     Heart sounds: No murmur.  Pulmonary:     Effort: Pulmonary effort is normal. No respiratory distress.     Breath sounds: Normal breath sounds.  Abdominal:     Palpations: Abdomen is soft.  Tenderness: There is no abdominal tenderness.  Skin:    General: Skin is warm and dry.  Neurological:     General: No focal deficit present.     Mental Status: He is alert and oriented to person, place, and time.     Cranial Nerves: No cranial nerve deficit.     Sensory: No sensory deficit.     Motor: No weakness.  Psychiatric:        Mood and Affect: Mood normal.        Behavior: Behavior normal.      ED Treatments / Results  Labs (all labs ordered are listed, but only abnormal results are displayed) Labs Reviewed  BASIC METABOLIC PANEL - Abnormal; Notable for the following components:      Result Value   CO2 21 (*)    Glucose, Bld 134 (*)    All other components within normal limits  CBC WITH DIFFERENTIAL/PLATELET - Abnormal; Notable for the following components:   Hemoglobin 12.9 (*)    All other components within normal limits  URINALYSIS, ROUTINE W REFLEX MICROSCOPIC - Abnormal; Notable for the following components:   Color, Urine STRAW (*)    Glucose, UA 50 (*)    Protein, ur 100 (*)    All other components within normal limits  CBG MONITORING, ED    EKG None  Radiology Dg Chest Portable 1 View  Result Date: 03/21/2019 CLINICAL DATA:  Diabetes and hypertension.  Persistent hypoglycemia. EXAM: PORTABLE CHEST 1 VIEW COMPARISON:  02/16/2016 FINDINGS: The  heart size and mediastinal contours are within normal limits. Both lungs are clear. The visualized skeletal structures are unremarkable. IMPRESSION: Normal study. Electronically Signed   By: Danae Orleans M.D.   On: 03/21/2019 12:56    Procedures Procedures (including critical care time)  Medications Ordered in ED Medications - No data to display   Initial Impression / Assessment and Plan / ED Course  I have reviewed the triage vital signs and the nursing notes.  Pertinent labs & imaging results that were available during my care of the patient were reviewed by me and considered in my medical decision making (see chart for details).  43 yo male presenting with episode of hypoglycemia this morning with BS around 30's.  No LOC.  No seizures at home.  He recalls all events.  I suspect his symptoms were likely iatrogenic.  He reports he took an extra dose of novalog overnight around 1-2 am because his sugars were high.  He hadn't eaten anything for hours prior.    We discussed the nocturnal cycles of blood sugar regulation, I explained his sugars can spike at night, but then naturally fall in the morning, and he should refrain from medicating himself like this.  We also discussed lowering his lantus to 30 units nightly until he can f/u with his primary doctor regarding these issues.  Otherwise he has no signs or symptoms of infection.  No evidence of seizures.  He is clinically well appearing.  We will ensure he eats food in the ED.  Clinical Course as of Mar 21 26  Sat Mar 21, 2019  1510 Patient ate a sandwich and lunch, he has been asymptomatic since arrival.  He feels well and wants to go home.  We discussed the plan to lower his Lantus dosing to 30 units nightly, for him to refrain to use NovoLog outside of the intended times which is right before meals, and he will call and follow-up with his endocrinologist  on Monday.   [MT]  1602 Nurse celeste reports she did take temp on arrival it is  97.8, I asked them to document this   [MT]    Clinical Course User Index [MT] Siana Panameno, Kermit BaloMatthew J, MD        Final Clinical Impressions(s) / ED Diagnoses   Final diagnoses:  Hypoglycemia    ED Discharge Orders    None       Renaye Rakersrifan, Kermit BaloMatthew J, MD 03/22/19 [phone]

## 2019-03-21 NOTE — ED Triage Notes (Signed)
EMS reports pt CBG 31 #22  R AC, glucagon IM given, D10 100 ml, CBG 80. 70-130/8100% -16 A&O x 4. Been having problems regulating blood surgar for several days.

## 2019-03-21 NOTE — Discharge Instructions (Signed)
Instructions - please read  We will lower your dose of lantus to 30 units at night.  Do NOT take any novalog or humalog (short acting insulin) outside of the times you are supposed to take them, which is just before meals.   If you do not eat a meal, do not give yourself humalog.  It is VERY important that you call your endocrinologist or insulin doctor as soon as possible to discuss this ER visit.  Your doctor needs to know that you have changed your insulin dosing, and may want to talk to you about further plans with your insulin.  If your sugar continues to drop, or you have symptoms of low blood sugar at home, drink juice or eat candy and call 911 again.

## 2019-03-22 LAB — CBG MONITORING, ED: Glucose-Capillary: 167 mg/dL — ABNORMAL HIGH (ref 70–99)

## 2019-03-24 ENCOUNTER — Telehealth: Payer: Self-pay | Admitting: *Deleted

## 2019-03-24 NOTE — Telephone Encounter (Signed)
I have instructed [name] on numerous visits to apply for the financial assistance program as he is type 1 diabetic and he needs to be treated by an endocrinologist. As far as his hypoglycemia if he is not eating he does not need to administer his insulin. He should be eating a carb snack around 15-20 grams before bedtime as well as eating a high carb snack when he is at work if his job involves increased activity. I can not stress how important it is that he be seen by an endocrinologist. His sugars are very labile and need to be treated by a specialist.

## 2019-03-24 NOTE — Telephone Encounter (Signed)
Patient shares he is having Hypoglycemic episodes in the morning. Patient has been sent home from work for 31 CBG levels and has been seen in the ED twice the past week. Patient was scheduled for first available 1 week out.

## 2019-03-25 NOTE — Telephone Encounter (Signed)
Medical Assistant left message on patient's home and cell voicemail. Voicemail states to give a call back to [place] with [facility] at [phone]. Patient was informed of PCP's instructions..... I have instructed [name] on numerous visits to apply for the financial assistance program as he is type 1 diabetic and he needs to be treated by an endocrinologist. As far as his hypoglycemia if he is not eating he does not need to administer his insulin. He should be eating a carb snack around 15-20 grams before bedtime as well as eating a high carb snack when he is at work if his job involves increased activity. I can not stress how important it is that he be seen by an endocrinologist. His sugars are very labile and need to be treated by a specialist.

## 2019-03-31 NOTE — Progress Notes (Signed)
Patient ID: [name], male   DOB: 1975-07-17, 43 y.o.   MRN: [id number]       [name], is a 43 y.o. male  WNI:[id number]  FGH:[id number]  DOB - [birth year]  Subjective:  Chief Complaint and HPI: [name] is a 43 y.o. male here today for a follow up visit  after ED visit 03/21/2019 for an episode of hypoglycemia.  His blood sugars have not been controlled.  He had been taking 34 units lantus at bedtime but they changed it to 30 units at bedtime in the ED and told him to hold his evening humalog.  That is what he has been doing until today and blood sugars running from 200-350.  He did not take BP meds today bc he is out of his meds.    From ED note: 43 yo male presenting with episode of hypoglycemia this morning with BS around 30's.  No LOC.  No seizures at home.  He recalls all events.  I suspect his symptoms were likely iatrogenic.  He reports he took an extra dose of novalog overnight around 1-2 am because his sugars were high.  He hadn't eaten anything for hours prior.    We discussed the nocturnal cycles of blood sugar regulation, I explained his sugars can spike at night, but then naturally fall in the morning, and he should refrain from medicating himself like this.  We also discussed lowering his lantus to 30 units nightly until he can f/u with his primary doctor regarding these issues.  ED/Hospital notes reviewed.    ROS:   Constitutional:  No f/c, No night sweats, No unexplained weight loss. EENT:  No vision changes, No blurry vision, No hearing changes. No mouth, throat, or ear problems.  Respiratory: No cough, No SOB Cardiac: No CP, no palpitations GI:  No abd pain, No N/V/D. GU: No Urinary s/sx Musculoskeletal: No joint pain Neuro: No headache, no dizziness, no motor weakness.  Skin: No rash Endocrine:  No polydipsia. No polyuria.  Psych: Denies SI/HI  No problems updated.  ALLERGIES: Allergies  Allergen Reactions  . Ziprasidone Mesylate Other (See  Comments)    "severe muscle spasms"  . Geodon [Ziprasidone Hcl]     PAST MEDICAL HISTORY: Past Medical History:  Diagnosis Date  . Diabetes mellitus   . Hypertension   . Seizures (HCC)     MEDICATIONS AT HOME: Prior to Admission medications   Medication Sig Start Date End Date Taking? Authorizing Provider  albuterol (VENTOLIN HFA) 108 (90 Base) MCG/ACT inhaler Inhale 1-2 puffs into the lungs every 6 (six) hours as needed for wheezing or shortness of breath. 04/01/19   [name], PA-C  amLODipine (NORVASC) 5 MG tablet Take 1 tablet (5 mg total) by mouth daily. 10/27/18   Claiborne Rigg, NP  aspirin 81 MG tablet Take 81 mg by mouth daily.    [provider]  divalproex (DEPAKOTE ER) 500 MG 24 hr tablet Take 3 tablets every night 12/15/18   Van Clines, MD  glucose blood test strip Use as instructed. MUST MAKE APPT FOR FURTHER REFILLS 04/01/19   Georgian Co M, PA-C  Insulin Glargine (LANTUS SOLOSTAR) 100 UNIT/ML Solostar Pen INJECT 17 UNITS INTO THE SKIN twice daily 04/01/19   Georgian Co M, PA-C  insulin lispro (HUMALOG KWIKPEN) 100 UNIT/ML KwikPen Inject 1-4 Units into the skin 3 (three) times daily before meals. If sugar > 180 04/01/19   McClung, Angela M, [place]  Insulin Pen Needle (  B-D UF III MINI PEN NEEDLES) 31G X 5 MM MISC Insert into the skin as prescribed 04/01/19   Argentina Donovan, PA-C  Insulin Syringe-Needle U-100 (B-D INS SYRINGE 0.5CC/31GX5/16) 31G X 5/16" 0.5 ML MISC 1 each by Does not apply route 3 (three) times daily. 04/01/19   Argentina Donovan, PA-C  lisinopril (ZESTRIL) 20 MG tablet Take 1 tablet (20 mg total) by mouth every morning. 04/01/19   Argentina Donovan, PA-C  Omega-3 Fatty Acids (FISH OIL PO) Take 1 capsule by mouth daily.    [provider]  simvastatin (ZOCOR) 20 MG tablet Take 1 tablet (20 mg total) by mouth daily. 04/01/19   Argentina Donovan, PA-C  traZODone (DESYREL) 50 MG tablet Take 1 tablet (50 mg total) by mouth at  bedtime as needed for sleep. 04/01/19   Argentina Donovan, PA-C  TRUEplus Lancets 28G MISC 1 each by Does not apply route 3 (three) times daily. 04/01/19   Argentina Donovan, PA-C     Objective:  EXAM:   Vitals:   04/01/19 0938 04/01/19 0946  BP: (!) 156/84 (!) 165/82  Pulse: 62 71  Temp: 98 F (36.7 C)   TempSrc: Oral   SpO2: 100%   Weight: 198 lb (89.8 kg)   Height: 6' (1.829 m)     General appearance : A&OX3. NAD. Non-toxic-appearing HEENT: Atraumatic and Normocephalic.  PERRLA. EOM intact.  Chest/Lungs:  Breathing-non-labored, Good air entry bilaterally, breath sounds normal without rales, rhonchi, or wheezing  CVS: S1 S2 regular, no murmurs, gallops, rubs  Extremities: Bilateral Lower Ext shows no edema, both legs are warm to touch with = pulse throughout Neurology:  CN II-XII grossly intact, Non focal.   Psych:  TP linear. J/I WNL. Normal speech. Appropriate eye contact and affect.  Skin:  No Rash  Data Review Lab Results  Component Value Date   HGBA1C 8.9 (A) 04/01/2019   HGBA1C 9.6 (H) 10/28/2018   HGBA1C 9.8 (A) 04/09/2018     Assessment & Plan   1. DM (diabetes mellitus), type 1 with complications ([place]) Improved but Uncontrolled and had recent hypoglycemia-will divide Lantus. Hold humalog unless cbg >180 to prevent hypoglycemia esp with evening meal.  Consider referral to endocrine if patient is approved for financial assistance.   - Glucose (CBG) - HgB A1c - simvastatin (ZOCOR) 20 MG tablet; Take 1 tablet (20 mg total) by mouth daily.  Dispense: 90 tablet; Refill: 3 - Insulin Syringe-Needle U-100 (B-D INS SYRINGE 0.5CC/31GX5/16) 31G X 5/16" 0.5 ML MISC; 1 each by Does not apply route 3 (three) times daily.  Dispense: 90 each; Refill: 11 - Insulin Pen Needle (B-D UF III MINI PEN NEEDLES) 31G X 5 MM MISC; Insert into the skin as prescribed  Dispense: 90 each; Refill: 1 - insulin lispro (HUMALOG KWIKPEN) 100 UNIT/ML KwikPen; Inject 1-4 Units into the skin 3 (three)  times daily before meals. If sugar > 180  Dispense: 15 mL; Refill: 1 - Insulin Glargine (LANTUS SOLOSTAR) 100 UNIT/ML Solostar Pen; INJECT 17 UNITS INTO THE SKIN twice daily  Dispense: 15 mL; Refill: 1 - glucose blood test strip; Use as instructed. MUST MAKE APPT FOR FURTHER REFILLS  Dispense: 100 each; Refill: 0 - TRUEplus Lancets 28G MISC; 1 each by Does not apply route 3 (three) times daily.  Dispense: 100 each; Refill: 11  2. Essential hypertension Uncontrolled but was out of meds.  Resume meds - lisinopril (ZESTRIL) 20 MG tablet; Take 1 tablet (20 mg total) by  mouth every morning.  Dispense: 90 tablet; Refill: 3  3. Microalbuminuria due to type 1 diabetes mellitus (HCC) - lisinopril (ZESTRIL) 20 MG tablet; Take 1 tablet (20 mg total) by mouth every morning.  Dispense: 90 tablet; Refill: 3  4. Sleep disturbance - traZODone (DESYREL) 50 MG tablet; Take 1 tablet (50 mg total) by mouth at bedtime as needed for sleep.  Dispense: 30 tablet; Refill: 3  5. Encounter for examination following treatment at hospital  Patient have been counseled extensively about nutrition and exercise  Return for 3 weeks with [facility]. then to see PCP at [facility] discretion for next OV.  Discussed plan with [facility].   The patient was given clear instructions to go to ER or return to medical center if symptoms don't improve, worsen or new problems develop. The patient verbalized understanding. The patient was told to call to get lab results if they haven't heard anything in the next week.     Georgian Co, PA-C [facility] [place], [place] [phone]   04/01/2019, 10:24 AM

## 2019-04-01 ENCOUNTER — Other Ambulatory Visit: Payer: Self-pay

## 2019-04-01 ENCOUNTER — Ambulatory Visit: Payer: Self-pay | Attending: Family Medicine | Admitting: Physician Assistant

## 2019-04-01 VITALS — BP 165/82 | HR 71 | Temp 98.0°F | Ht 72.0 in | Wt 198.0 lb

## 2019-04-01 DIAGNOSIS — I10 Essential (primary) hypertension: Secondary | ICD-10-CM

## 2019-04-01 DIAGNOSIS — Z09 Encounter for follow-up examination after completed treatment for conditions other than malignant neoplasm: Secondary | ICD-10-CM

## 2019-04-01 DIAGNOSIS — G479 Sleep disorder, unspecified: Secondary | ICD-10-CM

## 2019-04-01 DIAGNOSIS — E1029 Type 1 diabetes mellitus with other diabetic kidney complication: Secondary | ICD-10-CM

## 2019-04-01 DIAGNOSIS — E108 Type 1 diabetes mellitus with unspecified complications: Secondary | ICD-10-CM

## 2019-04-01 DIAGNOSIS — R809 Proteinuria, unspecified: Secondary | ICD-10-CM

## 2019-04-01 LAB — POCT GLYCOSYLATED HEMOGLOBIN (HGB A1C): Hemoglobin A1C: 8.9 % — AB (ref 4.0–5.6)

## 2019-04-01 LAB — GLUCOSE, POCT (MANUAL RESULT ENTRY): POC Glucose: 79 mg/dl (ref 70–99)

## 2019-04-01 MED ORDER — BD PEN NEEDLE MINI U/F 31G X 5 MM MISC: 1 refills | Status: DC

## 2019-04-01 MED ORDER — LANTUS SOLOSTAR 100 UNIT/ML ~~LOC~~ SOPN: PEN_INJECTOR | SUBCUTANEOUS | 1 refills | Status: DC

## 2019-04-01 MED ORDER — INSULIN LISPRO (1 UNIT DIAL) 100 UNIT/ML (KWIKPEN): PEN_INJECTOR | SUBCUTANEOUS | 1 refills | Status: DC

## 2019-04-01 MED ORDER — ALBUTEROL SULFATE HFA 108 (90 BASE) MCG/ACT IN AERS: 1.0000 | INHALATION_SPRAY | Freq: Four times a day (QID) | RESPIRATORY_TRACT | 1 refills | Status: DC | PRN

## 2019-04-01 MED ORDER — SIMVASTATIN 20 MG PO TABS: 20.0000 mg | ORAL_TABLET | Freq: Every day | ORAL | 3 refills | Status: DC

## 2019-04-01 MED ORDER — LISINOPRIL 20 MG PO TABS: 20.0000 mg | ORAL_TABLET | Freq: Every morning | ORAL | 3 refills | Status: DC

## 2019-04-01 MED ORDER — GLUCOSE BLOOD VI STRP: ORAL_STRIP | 0 refills | Status: DC

## 2019-04-01 MED ORDER — TRAZODONE HCL 50 MG PO TABS: 50.0000 mg | ORAL_TABLET | Freq: Every evening | ORAL | 3 refills | Status: DC | PRN

## 2019-04-01 MED ORDER — TRUEPLUS LANCETS 28G MISC: 1.0000 | Freq: Three times a day (TID) | 11 refills | Status: DC

## 2019-04-01 MED ORDER — "INSULIN SYRINGE-NEEDLE U-100 31G X 5/16"" 0.5 ML MISC": 1.0000 | Freq: Three times a day (TID) | 11 refills | Status: DC

## 2019-04-01 MED FILL — ?traZODONE HCL 50MG TAB: 50 | 30 days supply | Qty: 30 | Fill #0

## 2019-04-01 MED FILL — ?SIMVASTATIN 20 MG TABLETS: 20 | 30 days supply | Qty: 30 | Fill #0

## 2019-04-01 MED FILL — LISINOPRIL 20 MG TABLET: 20 | 30 days supply | Qty: 30 | Fill #0

## 2019-04-01 MED FILL — TRUEPLUS 5-BEVEL PEN NEEDLE: 31G X 5 MM | 30 days supply | Qty: 90 | Fill #0

## 2019-04-01 MED FILL — TRUE METRIX TEST STRIP: 30 days supply | Qty: 100 | Fill #0

## 2019-04-01 MED FILL — TRUEPLUS SYR 0.5ML 31GX5/16: 31G X 5/16" | 30 days supply | Qty: 100 | Fill #0

## 2019-04-01 MED FILL — ?HUMALOG 100 UNITS/ML KWIKP: 100 | 25 days supply | Qty: 3 | Fill #0

## 2019-04-01 MED FILL — TRUEplus LANCETS 28G MISC: 30 days supply | Qty: 100 | Fill #0

## 2019-04-01 NOTE — Patient Instructions (Signed)
Hold Humalog for evening meal dose. Only use Humalog if blood sugar is >180. Divide lantus dose and take 17 units twice daily.   Check blood sugars 4 times daily and keep log along with insulin dosing.

## 2019-04-02 ENCOUNTER — Other Ambulatory Visit: Payer: Self-pay

## 2019-04-02 ENCOUNTER — Encounter (HOSPITAL_COMMUNITY): Payer: Self-pay | Admitting: Emergency Medicine

## 2019-04-02 ENCOUNTER — Observation Stay (HOSPITAL_COMMUNITY)
Admission: EM | Admit: 2019-04-02 | Discharge: 2019-04-03 | Disposition: A | Discharge status: Home / Self Care | Payer: Self-pay | Attending: Internal Medicine | Admitting: Internal Medicine

## 2019-04-02 DIAGNOSIS — E162 Hypoglycemia, unspecified: Secondary | ICD-10-CM

## 2019-04-02 DIAGNOSIS — N183 Chronic kidney disease, stage 3 unspecified: Secondary | ICD-10-CM | POA: Diagnosis present

## 2019-04-02 DIAGNOSIS — R52 Pain, unspecified: Secondary | ICD-10-CM | POA: Diagnosis not present

## 2019-04-02 DIAGNOSIS — Z833 Family history of diabetes mellitus: Secondary | ICD-10-CM | POA: Insufficient documentation

## 2019-04-02 DIAGNOSIS — E1042 Type 1 diabetes mellitus with diabetic polyneuropathy: Secondary | ICD-10-CM | POA: Insufficient documentation

## 2019-04-02 DIAGNOSIS — Z79899 Other long term (current) drug therapy: Secondary | ICD-10-CM | POA: Insufficient documentation

## 2019-04-02 DIAGNOSIS — Z888 Allergy status to other drugs, medicaments and biological substances status: Secondary | ICD-10-CM | POA: Insufficient documentation

## 2019-04-02 DIAGNOSIS — R9431 Abnormal electrocardiogram [ECG] [EKG]: Secondary | ICD-10-CM | POA: Diagnosis present

## 2019-04-02 DIAGNOSIS — I129 Hypertensive chronic kidney disease with stage 1 through stage 4 chronic kidney disease, or unspecified chronic kidney disease: Secondary | ICD-10-CM | POA: Insufficient documentation

## 2019-04-02 DIAGNOSIS — E108 Type 1 diabetes mellitus with unspecified complications: Secondary | ICD-10-CM

## 2019-04-02 DIAGNOSIS — G479 Sleep disorder, unspecified: Secondary | ICD-10-CM | POA: Insufficient documentation

## 2019-04-02 DIAGNOSIS — D61818 Other pancytopenia: Principal | ICD-10-CM | POA: Insufficient documentation

## 2019-04-02 DIAGNOSIS — T383X5A Adverse effect of insulin and oral hypoglycemic [antidiabetic] drugs, initial encounter: Secondary | ICD-10-CM

## 2019-04-02 DIAGNOSIS — Y929 Unspecified place or not applicable: Secondary | ICD-10-CM | POA: Insufficient documentation

## 2019-04-02 DIAGNOSIS — Z7982 Long term (current) use of aspirin: Secondary | ICD-10-CM | POA: Insufficient documentation

## 2019-04-02 DIAGNOSIS — E1022 Type 1 diabetes mellitus with diabetic chronic kidney disease: Secondary | ICD-10-CM | POA: Insufficient documentation

## 2019-04-02 DIAGNOSIS — I10 Essential (primary) hypertension: Secondary | ICD-10-CM | POA: Diagnosis present

## 2019-04-02 DIAGNOSIS — Z20828 Contact with and (suspected) exposure to other viral communicable diseases: Secondary | ICD-10-CM | POA: Insufficient documentation

## 2019-04-02 DIAGNOSIS — Z0184 Encounter for antibody response examination: Secondary | ICD-10-CM | POA: Insufficient documentation

## 2019-04-02 DIAGNOSIS — E160 Drug-induced hypoglycemia without coma: Secondary | ICD-10-CM | POA: Diagnosis present

## 2019-04-02 DIAGNOSIS — E161 Other hypoglycemia: Secondary | ICD-10-CM | POA: Diagnosis not present

## 2019-04-02 DIAGNOSIS — F4310 Post-traumatic stress disorder, unspecified: Secondary | ICD-10-CM | POA: Insufficient documentation

## 2019-04-02 DIAGNOSIS — R404 Transient alteration of awareness: Secondary | ICD-10-CM | POA: Diagnosis not present

## 2019-04-02 DIAGNOSIS — Z8249 Family history of ischemic heart disease and other diseases of the circulatory system: Secondary | ICD-10-CM | POA: Insufficient documentation

## 2019-04-02 DIAGNOSIS — Z794 Long term (current) use of insulin: Secondary | ICD-10-CM | POA: Insufficient documentation

## 2019-04-02 DIAGNOSIS — G40909 Epilepsy, unspecified, not intractable, without status epilepticus: Secondary | ICD-10-CM

## 2019-04-02 DIAGNOSIS — E10649 Type 1 diabetes mellitus with hypoglycemia without coma: Secondary | ICD-10-CM | POA: Insufficient documentation

## 2019-04-02 HISTORY — DX: Abnormal electrocardiogram (ECG) (EKG): R94.31

## 2019-04-02 LAB — CBC WITH DIFFERENTIAL/PLATELET
Abs Immature Granulocytes: 0.01 10*3/uL (ref 0.00–0.07)
Basophils Absolute: 0 10*3/uL (ref 0.0–0.1)
Basophils Relative: 0 %
Eosinophils Absolute: 0 10*3/uL (ref 0.0–0.5)
Eosinophils Relative: 1 %
HCT: 43.6 % (ref 39.0–52.0)
Hemoglobin: 14.3 g/dL (ref 13.0–17.0)
Immature Granulocytes: 0 %
Lymphocytes Relative: 39 %
Lymphs Abs: 1.2 10*3/uL (ref 0.7–4.0)
MCH: 29.3 pg (ref 26.0–34.0)
MCHC: 32.8 g/dL (ref 30.0–36.0)
MCV: 89.3 fL (ref 80.0–100.0)
Monocytes Absolute: 0.4 10*3/uL (ref 0.1–1.0)
Monocytes Relative: 12 %
Neutro Abs: 1.4 10*3/uL — ABNORMAL LOW (ref 1.7–7.7)
Neutrophils Relative %: 48 %
Platelets: 132 10*3/uL — ABNORMAL LOW (ref 150–400)
RBC: 4.88 MIL/uL (ref 4.22–5.81)
RDW: 12.9 % (ref 11.5–15.5)
WBC: 3 10*3/uL — ABNORMAL LOW (ref 4.0–10.5)
nRBC: 0 % (ref 0.0–0.2)

## 2019-04-02 LAB — COMPREHENSIVE METABOLIC PANEL
ALT: 15 U/L (ref 0–44)
AST: 21 U/L (ref 15–41)
Albumin: 3.6 g/dL (ref 3.5–5.0)
Alkaline Phosphatase: 41 U/L (ref 38–126)
Anion gap: 9 (ref 5–15)
BUN: 17 mg/dL (ref 6–20)
CO2: 28 mmol/L (ref 22–32)
Calcium: 9.1 mg/dL (ref 8.9–10.3)
Chloride: 104 mmol/L (ref 98–111)
Creatinine, Ser: 1.33 mg/dL — ABNORMAL HIGH (ref 0.61–1.24)
GFR calc Af Amer: >60 mL/min (ref >60)
GFR calc non Af Amer: >60 mL/min (ref >60)
Glucose, Bld: 97 mg/dL (ref 70–99)
Potassium: 3.8 mmol/L (ref 3.5–5.1)
Sodium: 141 mmol/L (ref 135–145)
Total Bilirubin: 0.3 mg/dL (ref 0.3–1.2)
Total Protein: 6.1 g/dL — ABNORMAL LOW (ref 6.5–8.1)

## 2019-04-02 LAB — SARS CORONAVIRUS 2 (TAT 6-24 HRS): SARS Coronavirus 2: NEGATIVE

## 2019-04-02 LAB — CBG MONITORING, ED
Glucose-Capillary: 85 mg/dL (ref 70–99)
Glucose-Capillary: 214 mg/dL — ABNORMAL HIGH (ref 70–99)
Glucose-Capillary: 74 mg/dL (ref 70–99)

## 2019-04-02 LAB — VALPROIC ACID LEVEL: Valproic Acid Lvl: 63 ug/mL (ref 50.0–100.0)

## 2019-04-02 MED ORDER — SODIUM CHLORIDE 0.9 % IV SOLN: Freq: Once | INTRAVENOUS | Status: DC

## 2019-04-02 MED ORDER — AMLODIPINE BESYLATE 5 MG PO TABS
5.0000 mg | ORAL_TABLET | Freq: Every day | ORAL | Status: DC
  Administered 2019-04-02: 5 mg via ORAL
  Filled 2019-04-02: qty 1

## 2019-04-02 MED ORDER — HYDRALAZINE HCL 25 MG PO TABS: 25.0000 mg | ORAL_TABLET | Freq: Four times a day (QID) | ORAL | Status: DC | PRN

## 2019-04-02 MED ORDER — INSULIN GLARGINE 100 UNIT/ML ~~LOC~~ SOLN
15.0000 [IU] | Freq: Every day | SUBCUTANEOUS | Status: DC
  Administered 2019-04-02: 15 [IU] via SUBCUTANEOUS
  Filled 2019-04-02 (×2): qty 0.15

## 2019-04-02 MED ORDER — ACETAMINOPHEN 325 MG PO TABS: 650.0000 mg | ORAL_TABLET | Freq: Four times a day (QID) | ORAL | Status: DC | PRN

## 2019-04-02 MED ORDER — DIVALPROEX SODIUM ER 500 MG PO TB24
1500.0000 mg | ORAL_TABLET | Freq: Every day | ORAL | Status: DC
  Administered 2019-04-02: 1500 mg via ORAL
  Filled 2019-04-02 (×2): qty 3

## 2019-04-02 MED ORDER — INSULIN ASPART 100 UNIT/ML ~~LOC~~ SOLN
0.0000 [IU] | Freq: Three times a day (TID) | SUBCUTANEOUS | Status: DC
  Administered 2019-04-03: 2 [IU] via SUBCUTANEOUS

## 2019-04-02 MED ORDER — INSULIN GLARGINE 100 UNIT/ML ~~LOC~~ SOLN: 15.0000 [IU] | Freq: Every day | SUBCUTANEOUS | Status: DC

## 2019-04-02 MED ORDER — MAGNESIUM SULFATE IN D5W 1-5 GM/100ML-% IV SOLN
1.0000 g | Freq: Once | INTRAVENOUS | Status: AC
  Administered 2019-04-02: 1 g via INTRAVENOUS
  Filled 2019-04-02: qty 100

## 2019-04-02 MED ORDER — SODIUM CHLORIDE 0.9% FLUSH
3.0000 mL | Freq: Two times a day (BID) | INTRAVENOUS | Status: DC
  Administered 2019-04-02: 3 mL via INTRAVENOUS

## 2019-04-02 MED ORDER — DEXTROSE-NACL 5-0.9 % IV SOLN
INTRAVENOUS | Status: DC
  Administered 2019-04-02 – 2019-04-03 (×2): via INTRAVENOUS

## 2019-04-02 MED ORDER — ENOXAPARIN SODIUM 40 MG/0.4ML ~~LOC~~ SOLN
40.0000 mg | SUBCUTANEOUS | Status: DC
  Administered 2019-04-02: 40 mg via SUBCUTANEOUS
  Filled 2019-04-02: qty 0.4

## 2019-04-02 MED ORDER — SIMVASTATIN 20 MG PO TABS
20.0000 mg | ORAL_TABLET | Freq: Every day | ORAL | Status: DC
  Administered 2019-04-02: 20 mg via ORAL
  Filled 2019-04-02: qty 1

## 2019-04-02 MED ORDER — ASPIRIN 81 MG PO CHEW
81.0000 mg | CHEWABLE_TABLET | Freq: Every day | ORAL | Status: DC
  Administered 2019-04-02: 81 mg via ORAL
  Filled 2019-04-02: qty 1

## 2019-04-02 MED ORDER — LISINOPRIL 20 MG PO TABS
20.0000 mg | ORAL_TABLET | Freq: Every day | ORAL | Status: DC
  Administered 2019-04-02: 20 mg via ORAL
  Filled 2019-04-02: qty 1

## 2019-04-02 MED ORDER — ACETAMINOPHEN 650 MG RE SUPP: 650.0000 mg | Freq: Four times a day (QID) | RECTAL | Status: DC | PRN

## 2019-04-02 NOTE — ED Provider Notes (Addendum)
[facility] EMERGENCY DEPARTMENT Provider Note   CSN: [id number] Arrival date & time: 04/02/19  0945     History   Chief Complaint Chief Complaint  Patient presents with  . Hypoglycemia    HPI [name] is a 43 y.o. male with a history of insulin-dependent diabetes (prescribed 30 units of Lantus at night Humalog 1 to 4 units 3 times daily before meals) seizure disorder on Depakote, hypertension, presented to the emergency department with hypoglycemia.  This is unfortunately patient's third visit in the past month with episodes of hypoglycemia.  He tells me that he was in his usual state of health yesterday, ate to veggie burgers with bonds prior to bedtime, and give himself his usual dose of 30 units of Lantus before bed.  He states he did not take his Humalog before dinner, but did give himself 2 units of Humalog earlier in the day with lunch.  He went to bed feeling well.  He then has difficult memories with how he woke up in the morning, stating that he only remembers his long clock ringing, but his memory is fuzzy.  He states when this happens usually because blood sugar was very low.  His mother reportedly called EMS who states his blood sugar was 27 in the field and gave him 1 mg of glucagon.  The patient then promptly became more awake.  Patient denies feeling that he had a seizure.  He denies headache.  He denies chest pain, shortness of breath, cough, fevers, chills, dysuria, abdominal pain or diarrhea.  He reports he had a meeting with the PA provider yesterday who I decided to split his Lantus dosing into 15 units twice daily instead of 30 units nightly.  He states he has not yet adjusted to this new change.  However since his last ER visit on September 12 he had been taking 30 units nightly without any problems.  He reports he has been taking a workout supplements in the evening up until his last ER visit on September 12, when he stopped taking the supplement.   This was called "NutraKey."     HPI  Past Medical History:  Diagnosis Date  . Diabetes mellitus   . Hypertension   . Seizures [facility])     Patient Active Problem List   Diagnosis Date Noted  . Hypoglycemia due to insulin 04/02/2019  . Microalbuminuria due to type 1 diabetes mellitus ([place]) 01/23/2017  . Knee pain 03/16/2016  . Scrotal pain 01/13/2016  . Seizure disorder ([place]) 03/10/2015  . Type 1 diabetes mellitus with hyperlipidemia ([place]) 03/10/2015  . 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 UNSPECIFIED VITAMIN D DEFICIENCY 06/16/2009  . PTSD (post-traumatic stress disorder) 03/26/2008  . Essential hypertension, benign 03/24/2008  . SLEEP DISORDER 03/24/2008  . SLEEP DEPRIVATION 02/09/2008  . DM (diabetes mellitus), type 1 with complications ([place]) 02/54/2706    Past Surgical History:  Procedure Laterality Date  . HERNIA REPAIR          Home Medications    Prior to Admission medications   Medication Sig Start Date End Date Taking? Authorizing Provider  albuterol (VENTOLIN HFA) 108 (90 Base) MCG/ACT inhaler Inhale 1-2 puffs into the lungs every 6 (six) hours as needed for wheezing or shortness of breath. 04/01/19  Yes Freeman Caldron M, PA-C  amLODipine (NORVASC) 5 MG tablet Take 1 tablet (5 mg total) by mouth daily. Patient taking differently: Take 5 mg by mouth at bedtime.  10/27/18  Yes Raul Del,  Shea Stakes, NP  aspirin 81 MG tablet Take 81 mg by mouth at bedtime.    Yes [provider]  divalproex (DEPAKOTE ER) 500 MG 24 hr tablet Take 3 tablets every night Patient taking differently: Take 1,500 mg by mouth at bedtime.  12/15/18  Yes Van Clines, MD  Insulin Glargine (LANTUS SOLOSTAR) 100 UNIT/ML Solostar Pen INJECT 17 UNITS INTO THE SKIN twice daily Patient taking differently: Inject 15 Units into the skin 2 (two) times daily.  04/01/19  Yes McClung, Angela M, PA-C  insulin lispro (HUMALOG KWIKPEN) 100 UNIT/ML KwikPen Inject 1-4 Units into the skin 3 (three)  times daily before meals. If sugar > 180 Patient taking differently: Inject 1-4 Units into the skin See admin instructions. Inject 1-4 units into the skin 3 (three) times daily before meals if sugar (BGL) >180 04/01/19  Yes McClung, Angela M, PA-C  lisinopril (ZESTRIL) 20 MG tablet Take 1 tablet (20 mg total) by mouth every morning. Patient taking differently: Take 20 mg by mouth at bedtime.  04/01/19  Yes McClung, Marzella Schlein, PA-C  Omega-3 Fatty Acids (FISH OIL PO) Take 1 capsule by mouth daily.   Yes [provider]  simvastatin (ZOCOR) 20 MG tablet Take 1 tablet (20 mg total) by mouth daily. Patient taking differently: Take 20 mg by mouth at bedtime.  04/01/19  Yes [name], PA-C  traZODone (DESYREL) 50 MG tablet Take 1 tablet (50 mg total) by mouth at bedtime as needed for sleep. 04/01/19  Yes Georgian Co M, PA-C  glucose blood test strip Use as instructed. MUST MAKE APPT FOR FURTHER REFILLS 04/01/19   [name], PA-C  Insulin Pen Needle (B-D UF III MINI PEN NEEDLES) 31G X 5 MM MISC Insert into the skin as prescribed 04/01/19   [name], PA-C  Insulin Syringe-Needle U-100 (B-D INS SYRINGE 0.5CC/31GX5/16) 31G X 5/16" 0.5 ML MISC 1 each by Does not apply route 3 (three) times daily. 04/01/19   [name], PA-C  TRUEplus Lancets 28G MISC 1 each by Does not apply route 3 (three) times daily. 04/01/19   [name], PA-C    Family History Family History  Problem Relation Age of Onset  . Hypertension Mother   . Diabetes Paternal Uncle     Social History Social History   Tobacco Use  . Smoking status: Never Smoker  . Smokeless tobacco: Never Used  Substance Use Topics  . Alcohol use: Yes    Alcohol/week: 1.0 standard drinks    Types: 1 Glasses of wine per week    Comment: rarely  . Drug use: No     Allergies   Geodon [ziprasidone hcl] and Ziprasidone mesylate   Review of Systems Review of Systems  Constitutional: Negative for chills and  fever.  HENT: Negative for ear pain and sore throat.   Respiratory: Negative for cough and shortness of breath.   Cardiovascular: Negative for chest pain and palpitations.  Gastrointestinal: Negative for abdominal pain, nausea and vomiting.  Musculoskeletal: Negative for arthralgias and back pain.  Skin: Negative for color change and rash.  Neurological: Negative for seizures, syncope and headaches.  All other systems reviewed and are negative.    Physical Exam Updated Vital Signs BP (!) 174/97   Pulse (!) 56   Temp 97.7 F (36.5 C) (Oral)   Resp 18   Ht 6' (1.829 m)   Wt 89.8 kg   SpO2 100%   BMI 26.85 kg/m  Physical Exam Vitals signs and nursing note reviewed.  Constitutional:      Appearance: He is well-developed.  HENT:     Head: Normocephalic and atraumatic.  Eyes:     Conjunctiva/sclera: Conjunctivae normal.  Neck:     Musculoskeletal: Neck supple.  Cardiovascular:     Rate and Rhythm: Normal rate and regular rhythm.     Pulses: Normal pulses.     Heart sounds: No murmur.  Pulmonary:     Effort: Pulmonary effort is normal. No respiratory distress.     Breath sounds: Normal breath sounds.  Abdominal:     General: Abdomen is flat. There is no distension.     Palpations: Abdomen is soft.     Tenderness: There is no abdominal tenderness.  Skin:    General: Skin is warm and dry.  Neurological:     General: No focal deficit present.     Mental Status: He is alert and oriented to person, place, and time.     Cranial Nerves: No cranial nerve deficit.     Sensory: No sensory deficit.  Psychiatric:        Mood and Affect: Mood normal.        Behavior: Behavior normal.      ED Treatments / Results  Labs (all labs ordered are listed, but only abnormal results are displayed) Labs Reviewed  COMPREHENSIVE METABOLIC PANEL - Abnormal; Notable for the following components:      Result Value   Creatinine, Ser 1.33 (*)    Total Protein 6.1 (*)    All other  components within normal limits  CBC WITH DIFFERENTIAL/PLATELET - Abnormal; Notable for the following components:   WBC 3.0 (*)    Platelets 132 (*)    Neutro Abs 1.4 (*)    All other components within normal limits  CBG MONITORING, ED - Abnormal; Notable for the following components:   Glucose-Capillary 214 (*)    All other components within normal limits  SARS CORONAVIRUS 2 (TAT 6-24 HRS)  HIV ANTIBODY (ROUTINE TESTING W REFLEX)  URINALYSIS, ROUTINE W REFLEX MICROSCOPIC  CBC  BASIC METABOLIC PANEL  CBG MONITORING, ED    EKG EKG Interpretation  Date/Time:  Thursday April 02 2019 09:49:20 EDT Ventricular Rate:  66 PR Interval:    QRS Duration: 95 QT Interval:  489 QTC Calculation: 513 R Axis:   44 Text Interpretation:  Sinus rhythm RSR' in V1 or V2, right VCD or RVH Nonspecific T abnormalities, diffuse leads, similar to Aug 2020 EKG Prolonged QT interval No STEMI Confirmed by Alvester Chourifan, Evans Levee [phone]([id number]) on 04/02/2019 9:52:53 AM   Radiology No results found.  Procedures Procedures (including critical care time)  Medications Ordered in ED Medications  enoxaparin (LOVENOX) injection 40 mg (has no administration in time range)  sodium chloride flush (NS) 0.9 % injection 3 mL (has no administration in time range)  0.9 %  sodium chloride infusion (has no administration in time range)  acetaminophen (TYLENOL) tablet 650 mg (has no administration in time range)    Or  acetaminophen (TYLENOL) suppository 650 mg (has no administration in time range)  insulin aspart (novoLOG) injection 0-9 Units (has no administration in time range)  hydrALAZINE (APRESOLINE) tablet 25 mg (has no administration in time range)  aspirin tablet 81 mg (has no administration in time range)  amLODipine (NORVASC) tablet 5 mg (has no administration in time range)  lisinopril (ZESTRIL) tablet 20 mg (has no administration in time range)  simvastatin (ZOCOR) tablet 20 mg (  has no administration in time range)   insulin glargine (LANTUS) injection 15 Units (has no administration in time range)  divalproex (DEPAKOTE ER) 24 hr tablet 1,500 mg (has no administration in time range)     Initial Impression / Assessment and Plan / ED Course  I have reviewed the triage vital signs and the nursing notes.  Pertinent labs & imaging results that were available during my care of the patient were reviewed by me and considered in my medical decision making (see chart for details).   43 year old male with a history of insulin-dependent diabetes presenting to the emergency department for his third visit in the past month with an episode of hypoglycemia.  Blood sugar was in the 20s this morning.  There were no reported seizure activity.  I am concerned that he continues to have these recurrent episodes despite adjustments in his Lantus dosing, which was decreased the last time I saw him in the emergency department.  He has no infectious symptoms.  He is eating well in the evening.  I do not believe he is been giving himself iatrogenic doses of insulin at night since his last visit.  Is unclear what is causing this continued hypoglycemia.  This may still be medication induced as he may need a lower dose of Lantus, or split dosing as his PA recommended yesterday.  However he has been taken to 30 units that I recommended since discharging him 2 weeks ago and has had no problems with hypoglycemia until this morning.  At this point I believe it is reasonable to admit him to the hospital for further work-up and search for alternative causes of hypoglycemia.  His history of seizures makes him a further high risk patient for hypoglycemic episodes, which can be a trigger for seizures.  I do not believe he had a seizure today. Clinical Course as of Apr 01 1708  Thu Apr 02, 2019  1354 Awaiting callback regarding admission   [MT]    Clinical Course User Index [MT] Renaye Rakers Kermit Balo, MD    Final Clinical Impressions(s) / ED  Diagnoses   Final diagnoses:  Hypoglycemia    ED Discharge Orders    None       Terald Sleeper, MD 04/02/19 1147    Terald Sleeper, MD 04/02/19 [phone]

## 2019-04-02 NOTE — ED Triage Notes (Signed)
[facility] from home.Marland Kitchen Unresponsive CBG 27 glucagon 1mg  alert 1 min later.

## 2019-04-02 NOTE — Progress Notes (Signed)
Inpatient Diabetes Program Recommendations  AACE/ADA: New Consensus Statement on Inpatient Glycemic Control (2015)  Target Ranges:  Prepandial:   less than 140 mg/dL      Peak postprandial:   less than 180 mg/dL (1-2 hours)      Critically ill patients:  140 - 180 mg/dL   Lab Results  Component Value Date   GLUCAP 214 (H) 04/02/2019   HGBA1C 8.9 (A) 04/01/2019    Review of Glycemic Control  Diabetes history: type 1? Outpatient Diabetes medications: Lantus 15 units BID Current orders for Inpatient glycemic control: Novolog SENSITIVE correction scale TID & HS  Inpatient Diabetes Program Recommendations:   Received diabetes coordinator consult. Recommend starting Lantus 15 units at HS and continuing Novolog SENSITIVE correction scale TID & HS.   Harvel Ricks RN BSN CDE Diabetes Coordinator Pager: [phone]  8am-5pm

## 2019-04-02 NOTE — H&P (Addendum)
History and Physical    [name] ZSW:[id number] DOB: 07/30/1975 DOA: 04/02/2019  Referring MD/NP/PA: Alvester Chou, MD PCP: Claiborne Rigg, NP  Patient coming from: Home Via EMS  Chief Complaint: Low blood sugar  I have personally briefly reviewed patient's old medical records in [facility]   HPI: [name] is a 43 y.o. male with medical history significant of hypertension, insulin-dependent diabetes, and seizure disorder; who presents after having a low blood sugar at home.  These have been the patient's third ED visit for hypoglycemia in the last month.  His home regimen included Lantus 30 units before bed and Humalog 1 to 4 units with meals. He had a follow-up appointment at his primary care office yesterday, and was advised to change Lantus to 15 units twice daily.  However, last night he did not understand how to decrease the dose and took 30 units.  His mother reportedly called EMS as he was unresponsive.  His blood sugar was 27 and he was given 1 mg of glucagon prior to arrival.  Denies any  fevers, chills, chest pain, shortness of breath cough, dysuria, nausea, abdominal pain, diarrhea, tongue biting, or seizure.  At the beginning of this year COVID-19 started he had become more sedentary and reports gaining approximately 20 pounds in that time.  His last hemoglobin A1c was noted to be 8.9 yesterday, and improved from previous 9.6 back in April of this year.   He has been trying to abide by carb modified diet. Over the last month he has recognized that his episodes of hypoglycemia occur after he has done strenuous activity.  He has been more active lately, and reports having to walk to the bus to get around.  Also he reports that he is out of his blood pressure medications of amlodipine and needs a refill.  ED Course: Upon admission into the emergency department patient was noted to be afebrile, pulse 51-68, blood pressures elevated up to 181/105, and all other vital signs  maintained.  Labs significant for WBC 3, platelets 132, and creatinine 1.33.  COVID 19 screening negative.  Review of systems: A complete 10 point review of systems was performed and negative except for as noted above in HPI   Past Medical History:  Diagnosis Date  . Diabetes mellitus   . Hypertension   . Seizures (HCC)     Past Surgical History:  Procedure Laterality Date  . HERNIA REPAIR       reports that he has never smoked. He has never used smokeless tobacco. He reports current alcohol use of about 1.0 standard drinks of alcohol per week. He reports that he does not use drugs.  Allergies  Allergen Reactions  . Ziprasidone Mesylate Other (See Comments)    "severe muscle spasms"  . Geodon [Ziprasidone Hcl]     Family History  Problem Relation Age of Onset  . Hypertension Mother   . Diabetes Paternal Uncle     Prior to Admission medications   Medication Sig Start Date End Date Taking? Authorizing Provider  albuterol (VENTOLIN HFA) 108 (90 Base) MCG/ACT inhaler Inhale 1-2 puffs into the lungs every 6 (six) hours as needed for wheezing or shortness of breath. 04/01/19   [name], PA-C  amLODipine (NORVASC) 5 MG tablet Take 1 tablet (5 mg total) by mouth daily. 10/27/18   Claiborne Rigg, NP  aspirin 81 MG tablet Take 81 mg by mouth daily.    [provider]  divalproex (  DEPAKOTE ER) 500 MG 24 hr tablet Take 3 tablets every night 12/15/18   Cameron Sprang, MD  glucose blood test strip Use as instructed. MUST MAKE APPT FOR FURTHER REFILLS 04/01/19   Freeman Caldron M, PA-C  Insulin Glargine (LANTUS SOLOSTAR) 100 UNIT/ML Solostar Pen INJECT 17 UNITS INTO THE SKIN twice daily 04/01/19   Freeman Caldron M, PA-C  insulin lispro (HUMALOG KWIKPEN) 100 UNIT/ML KwikPen Inject 1-4 Units into the skin 3 (three) times daily before meals. If sugar > 180 04/01/19   McClung, Angela M, PA-C  Insulin Pen Needle (B-D UF III MINI PEN NEEDLES) 31G X 5 MM MISC Insert into the skin as  prescribed 04/01/19   Argentina Donovan, PA-C  Insulin Syringe-Needle U-100 (B-D INS SYRINGE 0.5CC/31GX5/16) 31G X 5/16" 0.5 ML MISC 1 each by Does not apply route 3 (three) times daily. 04/01/19   Argentina Donovan, PA-C  lisinopril (ZESTRIL) 20 MG tablet Take 1 tablet (20 mg total) by mouth every morning. 04/01/19   Argentina Donovan, PA-C  Omega-3 Fatty Acids (FISH OIL PO) Take 1 capsule by mouth daily.    [provider]  simvastatin (ZOCOR) 20 MG tablet Take 1 tablet (20 mg total) by mouth daily. 04/01/19   Argentina Donovan, PA-C  traZODone (DESYREL) 50 MG tablet Take 1 tablet (50 mg total) by mouth at bedtime as needed for sleep. 04/01/19   Argentina Donovan, PA-C  TRUEplus Lancets 28G MISC 1 each by Does not apply route 3 (three) times daily. 04/01/19   Argentina Donovan, PA-C    Physical Exam:  Constitutional: Middle-aged male NAD, calm, comfortable Vitals:   04/02/19 1315 04/02/19 1330 04/02/19 1345 04/02/19 1400  BP: (!) 149/94 (!) 166/90 (!) 157/89 (!) 174/97  Pulse: (!) 56 (!) 57 (!) 51 (!) 56  Resp: 17 16 11 18   Temp:      TempSrc:      SpO2: 100% 100% 99% 100%  Weight:      Height:       Eyes: PERRL, lids and conjunctivae normal ENMT: Mucous membranes are moist. Posterior pharynx clear of any exudate or lesions.Normal dentition.  Neck: normal, supple, no masses, no thyromegaly Respiratory: clear to auscultation bilaterally, no wheezing, no crackles. Normal respiratory effort. No accessory muscle use.  Cardiovascular: Bradycardic, no murmurs / rubs / gallops. No extremity edema. 2+ pedal pulses. No carotid bruits.  Abdomen: no tenderness, no masses palpated. No hepatosplenomegaly. Bowel sounds positive.  Musculoskeletal: no clubbing / cyanosis. No joint deformity upper and lower extremities. Good ROM, no contractures. Normal muscle tone.  Skin: no rashes, lesions, ulcers. No induration Neurologic: CN 2-12 grossly intact. Sensation intact, DTR normal. Strength 5/5 in  all 4.  Psychiatric: Normal judgment and insight. Alert and oriented x 3. Normal mood.     Labs on Admission: I have personally reviewed following labs and imaging studies  CBC: Recent Labs  Lab 04/02/19 1010  WBC 3.0*  NEUTROABS 1.4*  HGB 14.3  HCT 43.6  MCV 89.3  PLT 124*   Basic Metabolic Panel: Recent Labs  Lab 04/02/19 1010  NA 141  K 3.8  CL 104  CO2 28  GLUCOSE 97  BUN 17  CREATININE 1.33*  CALCIUM 9.1   GFR: Estimated Creatinine Clearance: 80.2 mL/min (A) (by C-G formula based on SCr of 1.33 mg/dL (H)). Liver Function Tests: Recent Labs  Lab 04/02/19 1010  AST 21  ALT 15  ALKPHOS 41  BILITOT 0.3  PROT  6.1*  ALBUMIN 3.6   No results for input(s): LIPASE, AMYLASE in the last 168 hours. No results for input(s): AMMONIA in the last 168 hours. Coagulation Profile: No results for input(s): INR, PROTIME in the last 168 hours. Cardiac Enzymes: No results for input(s): CKTOTAL, CKMB, CKMBINDEX, TROPONINI in the last 168 hours. BNP (last 3 results) No results for input(s): PROBNP in the last 8760 hours. HbA1C: Recent Labs    04/01/19 0951  HGBA1C 8.9*   CBG: Recent Labs  Lab 04/02/19 0947 04/02/19 1159  GLUCAP 85 214*   Lipid Profile: No results for input(s): CHOL, HDL, LDLCALC, TRIG, CHOLHDL, LDLDIRECT in the last 72 hours. Thyroid Function Tests: No results for input(s): TSH, T4TOTAL, FREET4, T3FREE, THYROIDAB in the last 72 hours. Anemia Panel: No results for input(s): VITAMINB12, FOLATE, FERRITIN, TIBC, IRON, RETICCTPCT in the last 72 hours. Urine analysis:    Component Value Date/Time   COLORURINE STRAW (A) 03/21/2019 1331   APPEARANCEUR CLEAR 03/21/2019 1331   LABSPEC 1.009 03/21/2019 1331   PHURINE 6.0 03/21/2019 1331   GLUCOSEU 50 (A) 03/21/2019 1331   HGBUR NEGATIVE 03/21/2019 1331   BILIRUBINUR NEGATIVE 03/21/2019 1331   BILIRUBINUR neg 03/10/2015 1305   KETONESUR NEGATIVE 03/21/2019 1331   PROTEINUR 100 (A) 03/21/2019 1331    UROBILINOGEN 0.2 03/10/2015 1305   UROBILINOGEN 1.0 03/04/2014 0936   NITRITE NEGATIVE 03/21/2019 1331   LEUKOCYTESUR NEGATIVE 03/21/2019 1331   Sepsis Labs: No results found for this or any previous visit (from the past 240 hour(s)).   Radiological Exams on Admission: No results found.  EKG: Independently reviewed.  Sinus rhythm 66 bpm with prolonged QTC 513  Assessment/Plan Insulin-dependent diabetes mellitus with hypoglycemia: This is patient's third visit for hypoglycemia.  EMS noted glucose as low as 27 given 1 mg of glucagon prior to arrival.  Patient notes that symptoms usually occur after exertion and usually early in the morning.  Insulin regimen previously included 30 units of Lantus nightly and 1 to 4 units of Humalog with meals. -Admit to medical telemetry bed -Hypoglycemic protocols -CBGs q. before meals with sensitive SSI -Recommend decreasing nightly Lantus from 15 units -Check 2 a.m. fingerstick blood glucose -Diabetic education consulted -Adjust regimen as needed for patient to follow-up in outpatient setting  Pancytopenia: Acute.  WBC 3 and platelet count 134 on admission.  Unclear if this is possibly related to patient's medications of Depakote. -Add on Depakote level -Recheck CBC in a.m.  Prolonged QT interval: Chronic.  QTc 513 on admission, but appears less prolonged than previous tracing on 8/17. -Recheck QT in a.m.  Essential Hypertension: Patient blood pressures initially elevated up to 181/105 on admission.  Needs refill on Amlodipine at discharge -Continue amlodipine and lisinopril -Hydralazine as needed for elevated blood pressures  Chronic kidney disease stage III: Stable creatinine 1.33 on admission which appears near patient's baseline. -Recheck creatinine in a.m.  Seizure disorder: Patient denies any complaints of recent seizures. -Continue Depakote  DVT prophylaxis: Lovenox Code Status: Full Family Communication: No family present at bedside  Disposition Plan: Possible discharge home in a.m. Consults called: None Admission status: Observation  Clydie Braunondell A Tison Leibold MD [facility] Pager [phone]   If 7PM-7AM, please contact night-coverage [email or web address] Password [email or web address]  04/02/2019, 2:02 PM

## 2019-04-02 NOTE — ED Notes (Signed)
Dinner tray ordered.

## 2019-04-02 NOTE — ED Notes (Signed)
Report given to 5M RN. All questions answered 

## 2019-04-03 LAB — GLUCOSE, CAPILLARY
Glucose-Capillary: 235 mg/dL — ABNORMAL HIGH (ref 70–99)
Glucose-Capillary: 200 mg/dL — ABNORMAL HIGH (ref 70–99)
Glucose-Capillary: 161 mg/dL — ABNORMAL HIGH (ref 70–99)
Glucose-Capillary: 200 mg/dL — ABNORMAL HIGH (ref 70–99)
Glucose-Capillary: 146 mg/dL — ABNORMAL HIGH (ref 70–99)

## 2019-04-03 LAB — MAGNESIUM: Magnesium: 1.9 mg/dL (ref 1.7–2.4)

## 2019-04-03 LAB — BASIC METABOLIC PANEL
Anion gap: 4 — ABNORMAL LOW (ref 5–15)
BUN: 17 mg/dL (ref 6–20)
CO2: 28 mmol/L (ref 22–32)
Calcium: 8.5 mg/dL — ABNORMAL LOW (ref 8.9–10.3)
Chloride: 108 mmol/L (ref 98–111)
Creatinine, Ser: 1.22 mg/dL (ref 0.61–1.24)
GFR calc Af Amer: >60 mL/min (ref >60)
GFR calc non Af Amer: >60 mL/min (ref >60)
Glucose, Bld: 182 mg/dL — ABNORMAL HIGH (ref 70–99)
Potassium: 3.6 mmol/L (ref 3.5–5.1)
Sodium: 140 mmol/L (ref 135–145)

## 2019-04-03 LAB — HIV ANTIBODY (ROUTINE TESTING W REFLEX): HIV Screen 4th Generation wRfx: NONREACTIVE

## 2019-04-03 LAB — CBC
HCT: 38.9 % — ABNORMAL LOW (ref 39.0–52.0)
Hemoglobin: 12.8 g/dL — ABNORMAL LOW (ref 13.0–17.0)
MCH: 29 pg (ref 26.0–34.0)
MCHC: 32.9 g/dL (ref 30.0–36.0)
MCV: 88 fL (ref 80.0–100.0)
Platelets: 109 10*3/uL — ABNORMAL LOW (ref 150–400)
RBC: 4.42 MIL/uL (ref 4.22–5.81)
RDW: 13 % (ref 11.5–15.5)
WBC: 5.9 10*3/uL (ref 4.0–10.5)
nRBC: 0 % (ref 0.0–0.2)

## 2019-04-03 MED ORDER — LANTUS SOLOSTAR 100 UNIT/ML ~~LOC~~ SOPN: PEN_INJECTOR | SUBCUTANEOUS | 1 refills | Status: DC

## 2019-04-03 NOTE — Discharge Summary (Signed)
Physician Discharge Summary  [name] WER:[id number] DOB: 01/02/1976 DOA: 04/02/2019  PCP: Gildardo Pounds, NP  Admit date: 04/02/2019 Discharge date: 04/03/2019  Admitted From: home Discharge disposition: home   Recommendations for Outpatient Follow-Up:   1. Patient to take 15 units QHS and bring log of blood sugars to PCP for adjustment or call-- he is not to adjust on own 2. Cbc at next visit   Discharge Diagnosis:   Principal Problem:   Hypoglycemia due to insulin Active Problems:   Essential hypertension, benign   Seizure disorder (HCC)   CKD (chronic kidney disease), stage III (HCC)   Prolonged QT interval    Discharge Condition: Improved.  Diet recommendation: Low sodium, heart healthy.  Carbohydrate-modified  Wound care: None.  Code status: Full.   History of Present Illness:   [name] is a 43 y.o. male with medical history significant of hypertension, insulin-dependent diabetes, and seizure disorder; who presents after having a low blood sugar at home.  These have been the patient's third ED visit for hypoglycemia in the last month.  His home regimen included Lantus 30 units before bed and Humalog 1 to 4 units with meals. He had a follow-up appointment at his primary care office yesterday, and was advised to change Lantus to 15 units twice daily.  However, last night he did not understand how to decrease the dose and took 30 units.  His mother reportedly called EMS as he was unresponsive.  His blood sugar was 27 and he was given 1 mg of glucagon prior to arrival.  Denies any  fevers, chills, chest pain, shortness of breath cough, dysuria, nausea, abdominal pain, diarrhea, tongue biting, or seizure.  At the beginning of this year COVID-19 started he had become more sedentary and reports gaining approximately 20 pounds in that time.  His last hemoglobin A1c was noted to be 8.9 yesterday, and improved from previous 9.6 back in April of this year.   He  has been trying to abide by carb modified diet. Over the last month he has recognized that his episodes of hypoglycemia occur after he has done strenuous activity.  He has been more active lately, and reports having to walk to the bus to get around.  Also he reports that he is out of his blood pressure medications of amlodipine and needs a refill.   Hospital Course by Problem:   Insulin-dependent diabetes mellitus with hypoglycemia: This is patient's third visit for hypoglycemia.  EMS noted glucose as low as 27 given 1 mg of glucagon prior to arrival.  Patient notes that symptoms usually occur after exertion and usually early in the morning.  Insulin regimen previously included 30 units of Lantus nightly and 1 to 4 units of Humalog with meals. -patient did not adjust lantus as suggested in PCP's office and said he was taking 30 QHS   Prolonged QT interval: Chronic.  QTc 513 on admission, but appears less prolonged than previous tracing on 8/17. -improved this AM  Essential Hypertension: Patient blood pressures initially elevated up to 181/105 on admission. -resume home meds  Chronic kidney disease stage III: Stable creatinine 1.33 on admission which appears near patient's baseline.  Seizure disorder: Patient denies any complaints of recent seizures. -Continue Depakote    Medical Consultants:      Discharge Exam:   Vitals:   04/03/19 0332 04/03/19 1023  BP: 124/83 133/77  Pulse: (!) 46 (!) 54  Resp: 18 18  Temp:  97.6 F (36.4 C) 97.9 F (36.6 C)  SpO2: 100% 100%   Vitals:   04/02/19 2100 04/02/19 2339 04/03/19 0332 04/03/19 1023  BP: 135/78 (!) 168/85 124/83 133/77  Pulse: (!) 58 (!) 53 (!) 46 (!) 54  Resp: 16 16 18 18   Temp:  98.1 F (36.7 C) 97.6 F (36.4 C) 97.9 F (36.6 C)  TempSrc:  Oral Oral Oral  SpO2: 97%  100% 100%  Weight:   87.3 kg   Height:        General exam: Appears calm and comfortable.     The results of significant diagnostics from this  hospitalization (including imaging, microbiology, ancillary and laboratory) are listed below for reference.     Procedures and Diagnostic Studies:   No results found.   Labs:   Basic Metabolic Panel: Recent Labs  Lab 04/02/19 1010 04/03/19 0703  NA 141 140  K 3.8 3.6  CL 104 108  CO2 28 28  GLUCOSE 97 182*  BUN 17 17  CREATININE 1.33* 1.22  CALCIUM 9.1 8.5*  MG  --  1.9   GFR Estimated Creatinine Clearance: 87.5 mL/min (by C-G formula based on SCr of 1.22 mg/dL). Liver Function Tests: Recent Labs  Lab 04/02/19 1010  AST 21  ALT 15  ALKPHOS 41  BILITOT 0.3  PROT 6.1*  ALBUMIN 3.6   No results for input(s): LIPASE, AMYLASE in the last 168 hours. No results for input(s): AMMONIA in the last 168 hours. Coagulation profile No results for input(s): INR, PROTIME in the last 168 hours.  CBC: Recent Labs  Lab 04/02/19 1010 04/03/19 0703  WBC 3.0* 5.9  NEUTROABS 1.4*  --   HGB 14.3 12.8*  HCT 43.6 38.9*  MCV 89.3 88.0  PLT 132* 109*   Cardiac Enzymes: No results for input(s): CKTOTAL, CKMB, CKMBINDEX, TROPONINI in the last 168 hours. BNP: Invalid input(s): POCBNP CBG: Recent Labs  Lab 04/02/19 2114 04/03/19 0334 04/03/19 0648 04/03/19 0830 04/03/19 1149  GLUCAP 235* 200* 161* 200* 146*   D-Dimer No results for input(s): DDIMER in the last 72 hours. Hgb A1c Recent Labs    04/01/19 0951  HGBA1C 8.9*   Lipid Profile No results for input(s): CHOL, HDL, LDLCALC, TRIG, CHOLHDL, LDLDIRECT in the last 72 hours. Thyroid function studies No results for input(s): TSH, T4TOTAL, T3FREE, THYROIDAB in the last 72 hours.  Invalid input(s): FREET3 Anemia work up No results for input(s): VITAMINB12, FOLATE, FERRITIN, TIBC, IRON, RETICCTPCT in the last 72 hours. Microbiology Recent Results (from the past 240 hour(s))  SARS CORONAVIRUS 2 (TAT 6-24 HRS) Nasopharyngeal Nasopharyngeal Swab     Status: None   Collection Time: 04/02/19 12:00 PM   Specimen:  Nasopharyngeal Swab  Result Value Ref Range Status   SARS Coronavirus 2 NEGATIVE NEGATIVE Final    Comment: (NOTE) SARS-CoV-2 target nucleic acids are NOT DETECTED. The SARS-CoV-2 RNA is generally detectable in upper and lower respiratory specimens during the acute phase of infection. Negative results do not preclude SARS-CoV-2 infection, do not rule out co-infections with other pathogens, and should not be used as the sole basis for treatment or other patient management decisions. Negative results must be combined with clinical observations, patient history, and epidemiological information. The expected result is Negative. Fact Sheet for Patients: 04/04/19 Fact Sheet for Healthcare Providers: HairSlick.no This test is not yet approved or cleared by the quierodirigir.com FDA and  has been authorized for detection and/or diagnosis of SARS-CoV-2 by FDA under an Emergency Use  Authorization (EUA). This EUA will remain  in effect (meaning this test can be used) for the duration of the COVID-19 declaration under Section 56 4(b)(1) of the Act, 21 U.S.C. section 360bbb-3(b)(1), unless the authorization is terminated or revoked sooner. Performed at [facility], [address] [place], [place] [address]      Discharge Instructions:   Discharge Instructions    Diet - low sodium heart healthy   Complete by: As directed    Diet Carb Modified   Complete by: As directed    Increase activity slowly   Complete by: As directed      Allergies as of 04/03/2019      Reactions   Geodon [ziprasidone Hcl] Other (See Comments)   Severe muscle spasms (ENTIRE BODY)- had to call EMS   Ziprasidone Mesylate Other (See Comments)   "severe muscle spasms"- Had to call EMS      Medication List    TAKE these medications   albuterol 108 (90 Base) MCG/ACT inhaler Commonly known as: VENTOLIN HFA Inhale 1-2 puffs into the lungs every 6  (six) hours as needed for wheezing or shortness of breath.   amLODipine 5 MG tablet Commonly known as: NORVASC Take 1 tablet (5 mg total) by mouth daily. What changed: when to take this   aspirin 81 MG tablet Take 81 mg by mouth at bedtime.   B-D UF III MINI PEN NEEDLES 31G X 5 MM Misc Generic drug: Insulin Pen Needle Insert into the skin as prescribed   divalproex 500 MG 24 hr tablet Commonly known as: DEPAKOTE ER Take 3 tablets every night What changed:   how much to take  how to take this  when to take this  additional instructions   FISH OIL PO Take 1 capsule by mouth daily.   glucose blood test strip Use as instructed. MUST MAKE APPT FOR FURTHER REFILLS   insulin lispro 100 UNIT/ML KwikPen Commonly known as: HumaLOG KwikPen Inject 1-4 Units into the skin 3 (three) times daily before meals. If sugar > 180 What changed:   how much to take  how to take this  when to take this  additional instructions   Insulin Syringe-Needle U-100 31G X 5/16" 0.5 ML Misc Commonly known as: B-D INS SYRINGE 0.5CC/31GX5/16 1 each by Does not apply route 3 (three) times daily.   Lantus SoloStar 100 UNIT/ML Solostar Pen Generic drug: Insulin Glargine INJECT 15 UNITS INTO THE SKIN QHS What changed: additional instructions   lisinopril 20 MG tablet Commonly known as: ZESTRIL Take 1 tablet (20 mg total) by mouth every morning. What changed: when to take this   simvastatin 20 MG tablet Commonly known as: ZOCOR Take 1 tablet (20 mg total) by mouth daily. What changed: when to take this   traZODone 50 MG tablet Commonly known as: DESYREL Take 1 tablet (50 mg total) by mouth at bedtime as needed for sleep.   TRUEplus Lancets 28G Misc 1 each by Does not apply route 3 (three) times daily.      Follow-up Information    Claiborne RiggFleming, Zelda W, NP Follow up.   Specialty: Nurse Practitioner Why: bring log of blood sugars to PCP Contact information: [address]  [place] [address] [phone]            Time coordinating discharge: 35 min  Signed:  Joseph ArtJessica U Audery Wassenaar DO  [facility] 04/03/2019, 1:37 PM

## 2019-04-03 NOTE — Plan of Care (Signed)
DISCHARGE NOTE HOME [name] to be discharged home per MD order. Discussed prescriptions and follow up appointments with the patient. Prescriptions given to patient; medication list explained in detail. Patient verbalized understanding.  Skin clean, dry and intact without evidence of skin break down, no evidence of skin tears noted. IV catheter discontinued intact. Site without signs and symptoms of complications. Dressing and pressure applied. Pt denies pain at the site currently. No complaints noted.  Patient free of lines, drains, and wounds.   An After Visit Summary (AVS) was printed and given to the patient. Patient escorted via wheelchair, and discharged home via private auto.  Stephan Minister, RN

## 2019-04-03 NOTE — Progress Notes (Signed)
Pt hr running sinus brady, has gotten as low as hr-39, while sleeping. On call provider made aware. RN will continue to monitor.

## 2019-04-03 NOTE — Progress Notes (Addendum)
Inpatient Diabetes Program Recommendations  AACE/ADA: New Consensus Statement on Inpatient Glycemic Control (2015)  Target Ranges:  Prepandial:   less than 140 mg/dL      Peak postprandial:   less than 180 mg/dL (1-2 hours)      Critically ill patients:  140 - 180 mg/dL   Lab Results  Component Value Date   GLUCAP 146 (H) 04/03/2019   HGBA1C 8.9 (A) 04/01/2019    Review of Glycemic Control  Diabetes history: Type 1 (since age 43) Outpatient Diabetes medications: Lantus 30 units at HS, Humalog 2-3 units TID with meals Current orders for Inpatient glycemic control: Lantus 15 units at HS, Novolog SENSITIVE TID  Inpatient Diabetes Program Recommendations:    Spoke with patient on the phone about his diabetes. Patient was diagnosed at the age of 43 years old and has been on insulin all that time. This would suggest that he is a Type 1. States that he does not have any health insurance, but is able to get his insulin financially. He is working at [employer] part time right now.  Has a home blood glucose meter and strips. Checks blood sugars multiple times per day due to fear of having low blood sugars. The low blood sugars have been happening just over the last 2-3 weeks.  States that he is very anxious, especially at night and has trouble sleeping. Lives with his mother since [place] and has to take care of her since she has seizure disorder. His HgbA1C is 8.9% and he states that it has been higher in the past.   Will continue to monitor blood sugars while in the hospital. Will need to make sure patient can get his insulin after discharge. Will need to follow up with PCP.  Harvel Ricks RN BSN CDE Diabetes Coordinator Pager: [phone]  8am-5pm

## 2019-04-09 MED FILL — ?AMLODIPINE BESYLATE 5MG TA: 5 | 30 days supply | Qty: 30 | Fill #2

## 2019-04-09 MED FILL — DIVALPROEX SOD ER 500 MG TA: 500 | 30 days supply | Qty: 90 | Fill #3

## 2019-05-08 MED FILL — ?AMLODIPINE BESYLATE 5MG TA: 5 | 30 days supply | Qty: 30 | Fill #3

## 2019-05-08 MED FILL — ?DIVALPROEX SOD ER 500 MG T: 500 | 30 days supply | Qty: 90 | Fill #4

## 2019-05-08 MED FILL — ?SIMVASTATIN 20 MG TABLETS: 20 | 30 days supply | Qty: 30 | Fill #1

## 2019-05-22 MED FILL — LISINOPRIL 20 MG TABLET: 20 | 30 days supply | Qty: 30 | Fill #1

## 2019-05-22 MED FILL — $LANTUS SOLOSTAR 100 UNITS/: 100 | 35 days supply | Qty: 12 | Fill #1

## 2019-05-25 MED FILL — ?HUMALOG 100 UNITS/ML KWIKP: 100 | 25 days supply | Qty: 3 | Fill #1

## 2019-06-03 MED FILL — ?HUMALOG 100 UNITS/ML KWIKP: 100 | 25 days supply | Qty: 3 | Fill #1

## 2019-06-11 MED FILL — ?AMLODIPINE BESYLATE 5 MG T: 5 MG | 30 days supply | Qty: 30 | Fill #4

## 2019-06-11 MED FILL — ?DIVALPROEX SOD ER 500 MG T: 500 | 30 days supply | Qty: 90 | Fill #5

## 2019-06-11 MED FILL — ?SIMVASTATIN 20 MG TABLETS: 20 | 30 days supply | Qty: 30 | Fill #2

## 2019-06-17 ENCOUNTER — Ambulatory Visit: Payer: Self-pay | Admitting: Neurology

## 2019-07-06 MED FILL — ?HUMALOG 100 UNITS/ML KWIKP: 100 | 25 days supply | Qty: 3 | Fill #2

## 2019-07-06 MED FILL — LISINOPRIL 20 MG TABLET: 20 | 30 days supply | Qty: 30 | Fill #2

## 2019-07-23 MED FILL — ?HUMALOG 100 UNITS/ML KWIKP: 100 | 25 days supply | Qty: 3 | Fill #3

## 2019-07-30 MED FILL — ?SIMVASTATIN 20 MG TABLETS: 20 | 30 days supply | Qty: 30 | Fill #3

## 2019-07-30 MED FILL — LISINOPRIL 20 MG TABLET: 20 | 30 days supply | Qty: 30 | Fill #3

## 2019-07-30 MED FILL — $LANTUS SOLOSTAR 100 UNITS/: 100 | 17 days supply | Qty: 6 | Fill #2

## 2019-07-30 MED FILL — ?TRAZODONE HCL 50 TABS: 50 | 30 days supply | Qty: 30 | Fill #1

## 2019-07-30 MED FILL — AMLODIPINE BESYLATE 5 MG TA: 5 | 30 days supply | Qty: 30 | Fill #5

## 2019-07-30 MED FILL — DIVALPROEX SOD ER 500 MG TA: 500 | 30 days supply | Qty: 90 | Fill #6

## 2019-08-03 ENCOUNTER — Ambulatory Visit: Payer: Self-pay | Attending: Nurse Practitioner | Admitting: Nurse Practitioner

## 2019-08-03 ENCOUNTER — Other Ambulatory Visit: Payer: Self-pay

## 2019-08-03 ENCOUNTER — Encounter: Payer: Self-pay | Admitting: Nurse Practitioner

## 2019-08-03 DIAGNOSIS — E108 Type 1 diabetes mellitus with unspecified complications: Secondary | ICD-10-CM

## 2019-08-03 DIAGNOSIS — E1065 Type 1 diabetes mellitus with hyperglycemia: Secondary | ICD-10-CM

## 2019-08-03 DIAGNOSIS — E785 Hyperlipidemia, unspecified: Secondary | ICD-10-CM

## 2019-08-03 DIAGNOSIS — E1069 Type 1 diabetes mellitus with other specified complication: Secondary | ICD-10-CM

## 2019-08-03 NOTE — Progress Notes (Signed)
Virtual Visit via Telephone Note Due to national recommendations of social distancing due to [place] 19, telehealth visit is felt to be most appropriate for this patient at this time.  I discussed the limitations, risks, security and privacy concerns of performing an evaluation and management service by telephone and the availability of in person appointments. I also discussed with the patient that there may be a patient responsible charge related to this service. The patient expressed understanding and agreed to proceed.    I connected with [name] on 08/03/19  at   4:10 PM EST  EDT by telephone and verified that I am speaking with the correct person using two identifiers.   Consent I discussed the limitations, risks, security and privacy concerns of performing an evaluation and management service by telephone and the availability of in person appointments. I also discussed with the patient that there may be a patient responsible charge related to this service. The patient expressed understanding and agreed to proceed.   Location of Patient: Private Residence   Location of Provider: [place] participating in Telemedicine visit: Geryl Rankins FNP-BC [place]    History of Present Illness: Telemedicine visit for: DM TYPE 1  He had a tele visit in September 2020. Medication adjustments were made due to poorly controlled blood glucose levels and Donald Lanka was instructed to increase Lantus to 17 units twice daily (up from 30 units daily). Unfortunately he had an episode of severe hypoglycemia with decreased level of consciousness after adjusting his medications and was seen in the ED one day later. He was treated and switched to 15 units of lantus daily and 1-4 units of  Humalog TID.  Today he states he is feeling much better. Current regimen: Lantus 18 units at night. Fasting averages: 140-160 Humalog: 1-5 units TID depending on  intake.  Still waiting to apply for the CAFA.  Lab Results  Component Value Date   HGBA1C 8.9 (A) 04/01/2019   LDL not at goal of <70. Endorses compliance taking simvistatin 20 mg daily as prescribed. Denies statin intolerance.  Lab Results  Component Value Date   LDLCALC 123 (H) 10/28/2018   Blood pressure well controlled with lisinopril 20 mg daily as prescribed. Denies chest pain, shortness of breath, palpitations, lightheadedness, dizziness, headaches or BLE edema.  BP Readings from Last 3 Encounters:  04/03/19 133/77  04/01/19 (!) 165/82  03/21/19 (!) 145/82    He reports being started on 3 new medications by psychiatry and was initially feeling lightheaded and unsteady on his feet. Symptoms have improved over the past week.  Past Medical History:  Diagnosis Date  . Diabetes mellitus   . Hypertension   . Seizures ([place])     Past Surgical History:  Procedure Laterality Date  . HERNIA REPAIR      Family History  Problem Relation Age of Onset  . Hypertension Mother   . Diabetes Paternal Uncle     Social History   Socioeconomic History  . Marital status: Single    Spouse name: Not on file  . Number of children: 3  . Years of education: Not on file  . Highest education level: Some college, no degree  Occupational History  . Not on file  Tobacco Use  . Smoking status: Never Smoker  . Smokeless tobacco: Never Used  Substance and Sexual Activity  . Alcohol use: Yes    Alcohol/week: 1.0 standard drinks  Types: 1 Glasses of wine per week    Comment: rarely  . Drug use: No  . Sexual activity: Not Currently  Other Topics Concern  . Not on file  Social History Narrative   Pt is left handed   Lives alone in 2 story home   Has 3 children   Some college education   Worked as Herbalist   Social Determinants of Health   Financial Resource Strain:   . Difficulty of Paying Living Expenses: Not on file  Food Insecurity:   . Worried About Programme researcher, broadcasting/film/video  in the Last Year: Not on file  . Ran Out of Food in the Last Year: Not on file  Transportation Needs:   . Lack of Transportation (Medical): Not on file  . Lack of Transportation (Non-Medical): Not on file  Physical Activity:   . Days of Exercise per Week: Not on file  . Minutes of Exercise per Session: Not on file  Stress:   . Feeling of Stress : Not on file  Social Connections:   . Frequency of Communication with Friends and Family: Not on file  . Frequency of Social Gatherings with Friends and Family: Not on file  . Attends Religious Services: Not on file  . Active Member of Clubs or Organizations: Not on file  . Attends Banker Meetings: Not on file  . Marital Status: Not on file     Observations/Objective: Awake, alert and oriented x 3   Review of Systems  Constitutional: Negative for fever, malaise/fatigue and weight loss.  HENT: Negative.  Negative for nosebleeds.   Eyes: Negative.  Negative for blurred vision, double vision and photophobia.  Respiratory: Negative.  Negative for cough and shortness of breath.   Cardiovascular: Negative.  Negative for chest pain, palpitations and leg swelling.  Gastrointestinal: Negative.  Negative for heartburn, nausea and vomiting.  Musculoskeletal: Negative.  Negative for myalgias.  Neurological: Positive for seizures. Negative for dizziness, focal weakness and headaches.  Psychiatric/Behavioral: Negative.  Negative for suicidal ideas.    Assessment and Plan: [name] was seen today for medication problem.  Diagnoses and all orders for this visit:  Poorly controlled type 1 diabetes mellitus with complication (HCC) -     Ambulatory referral to Ophthalmology  Type 1 diabetes mellitus with hyperlipidemia (HCC) Diabetes is poorly controlled. Advised patient to keep a fasting blood sugar log fast, 2 hours post lunch and bedtime which will be reviewed at the next office visit.   Continue blood sugar control as discussed in  office today, low carbohydrate diet, and regular physical exercise as tolerated, 150 minutes per week (30 min each day, 5 days per week, or 50 min 3 days per week). Keep blood sugar logs with fasting goal of 90-130 mg/dl, post prandial (after you eat) less than 180.  For Hypoglycemia: BS <60 and Hyperglycemia BS >400; contact the clinic ASAP. Annual eye exams and foot exams are recommended.   Follow Up Instructions Return in about 3 months (around 11/01/2019).     I discussed the assessment and treatment plan with the patient. The patient was provided an opportunity to ask questions and all were answered. The patient agreed with the plan and demonstrated an understanding of the instructions.   The patient was advised to call back or seek an in-person evaluation if the symptoms worsen or if the condition fails to improve as anticipated.  I provided 18 minutes of non-face-to-face time during this encounter including median intraservice time,  reviewing previous notes, labs, imaging, medications and explaining diagnosis and management.  Gildardo Pounds, FNP-BC

## 2019-08-06 ENCOUNTER — Other Ambulatory Visit: Payer: Self-pay

## 2019-09-03 MED FILL — ?HUMALOG 100 UNITS/ML KWIKP: 100 | 25 days supply | Qty: 3 | Fill #4

## 2019-09-03 MED FILL — $LANTUS SOLOSTAR 100 UNITS/: 100 | 26 days supply | Qty: 9 | Fill #0

## 2019-09-03 MED FILL — ?DIVALPROEX SOD ER 500 MG T: 500 | 30 days supply | Qty: 90 | Fill #7

## 2019-09-22 ENCOUNTER — Other Ambulatory Visit: Payer: Self-pay | Admitting: Family Medicine

## 2019-09-22 DIAGNOSIS — E108 Type 1 diabetes mellitus with unspecified complications: Secondary | ICD-10-CM

## 2019-09-22 MED FILL — ?TRAZODONE HCL 50 TABS: 50 | 30 days supply | Qty: 30 | Fill #2

## 2019-09-22 MED FILL — AMLODIPINE BESYLATE 5 MG TA: 5 | 30 days supply | Qty: 30 | Fill #6

## 2019-09-22 MED FILL — LISINOPRIL 20 MG TABLET: 20 | 30 days supply | Qty: 30 | Fill #4

## 2019-09-22 MED FILL — ?SIMVASTATIN 20 MG TABLETS: 20 | 30 days supply | Qty: 30 | Fill #4

## 2019-09-24 ENCOUNTER — Ambulatory Visit: Payer: Self-pay | Attending: Internal Medicine

## 2019-09-24 MED FILL — ?HUMALOG 100 UNITS/ML KWIKP: 100 | 25 days supply | Qty: 3 | Fill #0

## 2019-10-05 MED FILL — ?DIVALPROEX SOD ER 500 MG T: 500 | 30 days supply | Qty: 90 | Fill #8

## 2019-10-20 MED FILL — ?HUMALOG 100 UNITS/ML KWIKP: 100 | 25 days supply | Qty: 3 | Fill #1

## 2019-10-20 MED FILL — ?BASAGLAR 100 UNITS/ML KWPE: 100 | 26 days supply | Qty: 9 | Fill #1

## 2019-11-02 ENCOUNTER — Ambulatory Visit: Payer: Self-pay | Admitting: Nurse Practitioner

## 2019-11-03 ENCOUNTER — Other Ambulatory Visit: Payer: Self-pay | Admitting: Nurse Practitioner

## 2019-11-03 DIAGNOSIS — I10 Essential (primary) hypertension: Secondary | ICD-10-CM

## 2019-11-03 MED FILL — ?DIVALPROEX SOD ER 500 MG T: 500 | 30 days supply | Qty: 90 | Fill #9

## 2019-11-03 MED FILL — ?SIMVASTATIN 20MG TABLE: 20 | 30 days supply | Qty: 30 | Fill #5

## 2019-11-03 MED FILL — LISINOPRIL 20 MG TABLET: 20 | 30 days supply | Qty: 30 | Fill #5

## 2019-12-02 MED FILL — !BASAGLAR 100 UNIT/ML KWIK: 100 | 26 days supply | Qty: 9 | Fill #2

## 2019-12-09 MED FILL — LISINOPRIL 20 MG TABLET: 20 | 30 days supply | Qty: 30 | Fill #6

## 2019-12-09 MED FILL — ?DIVALPROEX SOD ER 500MG TA: 500 | 30 days supply | Qty: 90 | Fill #10

## 2019-12-09 MED FILL — ?SIMVASTATIN 20MG TABLE: 20 | 30 days supply | Qty: 30 | Fill #6

## 2019-12-21 ENCOUNTER — Emergency Department (HOSPITAL_COMMUNITY)
Admission: EM | Admit: 2019-12-21 | Discharge: 2019-12-21 | Disposition: A | Discharge status: Home / Self Care | Payer: No Typology Code available for payment source | Attending: Emergency Medicine | Admitting: Emergency Medicine

## 2019-12-21 ENCOUNTER — Encounter (HOSPITAL_COMMUNITY): Payer: Self-pay

## 2019-12-21 ENCOUNTER — Emergency Department (HOSPITAL_COMMUNITY): Payer: No Typology Code available for payment source

## 2019-12-21 ENCOUNTER — Other Ambulatory Visit: Payer: Self-pay

## 2019-12-21 DIAGNOSIS — Z79899 Other long term (current) drug therapy: Secondary | ICD-10-CM | POA: Insufficient documentation

## 2019-12-21 DIAGNOSIS — I129 Hypertensive chronic kidney disease with stage 1 through stage 4 chronic kidney disease, or unspecified chronic kidney disease: Secondary | ICD-10-CM | POA: Diagnosis not present

## 2019-12-21 DIAGNOSIS — N183 Chronic kidney disease, stage 3 unspecified: Secondary | ICD-10-CM | POA: Insufficient documentation

## 2019-12-21 DIAGNOSIS — Z7982 Long term (current) use of aspirin: Secondary | ICD-10-CM | POA: Insufficient documentation

## 2019-12-21 DIAGNOSIS — M25511 Pain in right shoulder: Secondary | ICD-10-CM | POA: Diagnosis not present

## 2019-12-21 DIAGNOSIS — E1065 Type 1 diabetes mellitus with hyperglycemia: Secondary | ICD-10-CM | POA: Insufficient documentation

## 2019-12-21 DIAGNOSIS — R739 Hyperglycemia, unspecified: Secondary | ICD-10-CM

## 2019-12-21 LAB — CBG MONITORING, ED
Glucose-Capillary: 545 mg/dL (ref 70–99)
Glucose-Capillary: 421 mg/dL — ABNORMAL HIGH (ref 70–99)

## 2019-12-21 MED ORDER — INSULIN ASPART 100 UNIT/ML ~~LOC~~ SOLN
10.0000 [IU] | Freq: Once | SUBCUTANEOUS | Status: AC
  Administered 2019-12-21: 10 [IU] via SUBCUTANEOUS
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
## 2019-12-22 MED FILL — ?HUMALOG 100 UNITS/ML KWIKP: 100 | 25 days supply | Qty: 3 | Fill #3

## 2020-01-15 ENCOUNTER — Other Ambulatory Visit: Payer: Self-pay | Admitting: Neurology

## 2020-01-15 DIAGNOSIS — G40309 Generalized idiopathic epilepsy and epileptic syndromes, not intractable, without status epilepticus: Secondary | ICD-10-CM

## 2020-01-15 MED FILL — LISINOPRIL 20 MG TABLET: 20 | 30 days supply | Qty: 30 | Fill #7

## 2020-01-15 MED FILL — ?SIMVASTATIN 20MG TABLE: 20 | 30 days supply | Qty: 30 | Fill #7

## 2020-01-18 ENCOUNTER — Other Ambulatory Visit: Payer: Self-pay | Admitting: Neurology

## 2020-01-18 ENCOUNTER — Telehealth: Payer: Self-pay | Admitting: Neurology

## 2020-01-18 ENCOUNTER — Telehealth: Payer: Self-pay

## 2020-01-18 DIAGNOSIS — G40309 Generalized idiopathic epilepsy and epileptic syndromes, not intractable, without status epilepticus: Secondary | ICD-10-CM

## 2020-01-18 MED ORDER — DIVALPROEX SODIUM ER 500 MG PO TB24: ORAL_TABLET | ORAL | 3 refills | Status: DC

## 2020-01-18 MED FILL — ?DIVALPROEX SOD ER 500MG TA: 500 | 30 days supply | Qty: 90 | Fill #0

## 2020-01-18 MED FILL — LISINOPRIL 20 MG TABLET: 20 | 30 days supply | Qty: 30 | Fill #8

## 2020-01-18 NOTE — Telephone Encounter (Signed)
[facility] 01/15/20 @ 5:24PM:  "Caller needs to sch an appointment. Caller is out of seizure medication and needs refill. 500mg  tabs, name unknown. No symptoms."  Calling pt to sch appt.

## 2020-01-18 NOTE — Telephone Encounter (Signed)
Has f/u appt 08/2020 which he must keep to cont refills

## 2020-01-19 MED FILL — ?HUMALOG 100 UNITS/ML KWIKP: 100 | 25 days supply | Qty: 3 | Fill #4

## 2020-01-19 MED FILL — ?BASAGLAR 100 UNITS/ML KWPE: 100 | 8 days supply | Qty: 3 | Fill #3

## 2020-02-08 ENCOUNTER — Telehealth: Payer: Self-pay | Admitting: Physician Assistant

## 2020-02-08 DIAGNOSIS — E108 Type 1 diabetes mellitus with unspecified complications: Secondary | ICD-10-CM

## 2020-02-08 MED FILL — ?BASAGLAR 100 UNITS/ML KWPE: 100 | 33 days supply | Qty: 6 | Fill #0

## 2020-02-08 NOTE — Telephone Encounter (Signed)
Requested medication (s) are due for refill today: no  Requested medication (s) are on the active medication list:  yes  Last refill:  01/19/2020  Future visit scheduled : yes  Notes to clinic:  medication filled by a different provider    Requested Prescriptions  Pending Prescriptions Disp Refills   Insulin Glargine (BASAGLAR KWIKPEN) 100 UNIT/ML [Pharmacy Med Name: BASAGLAR 100 UNITS/ML KWPE 100 Solution Pen-injector] 3 mL 1    Sig: INJECT 17 UNITS INTO THE SKIN TWICE DAILY      Endocrinology:  Diabetes - Insulins Failed - 02/08/2020  8:54 AM      Failed - HBA1C is between 0 and 7.9 and within 180 days    Hemoglobin A1C  Date Value Ref Range Status  04/01/2019 8.9 (A) 4.0 - 5.6 % Final   Hgb A1c MFr Bld  Date Value Ref Range Status  10/28/2018 9.6 (H) 4.8 - 5.6 % Final    Comment:             Prediabetes: 5.7 - 6.4          Diabetes: >6.4          Glycemic control for adults with diabetes: <7.0           Failed - Valid encounter within last 6 months    Recent Outpatient Visits           6 months ago Poorly controlled type 1 diabetes mellitus with complication [facility])   [facility] [place], [place] W, NP   10 months ago DM (diabetes mellitus), type 1 with complications [facility])   [facility] [place], Marzella Schlein, [place]   1 year ago Essential hypertension   [facility] [place], [place] W, NP   1 year ago DM (diabetes mellitus), type 1 with complications [facility])   [facility] [place], Shea Stakes, NP   1 year ago DM (diabetes mellitus), type 1 with complications [facility])   [facility] Cain Saupe, MD       Future Appointments             In 2 weeks Claiborne Rigg, NP [facility]

## 2020-02-09 NOTE — Telephone Encounter (Signed)
Rx is ready for pick-up. He is overdue for an office visit. We have two pens ready for him. Pt uses 18units/day - two pens will last 30 days. Pharmacy has notified him that it is ready for pickup.

## 2020-02-09 NOTE — Telephone Encounter (Signed)
Pt called in and wanted to check on this med.  He stated he is completely out. He stated he only rec'd 1 pen last time instead of 3?   Pharmacy - [facility]  Best number [phone]

## 2020-02-23 MED FILL — ?HUMALOG 100 UNITS/ML KWIKP: 100 | 25 days supply | Qty: 3 | Fill #1

## 2020-02-23 MED FILL — LISINOPRIL 20 MG TABLET: 20 | 30 days supply | Qty: 30 | Fill #9

## 2020-02-23 MED FILL — ?SIMVASTATIN 20MG TABLE: 20 | 30 days supply | Qty: 30 | Fill #8

## 2020-02-23 MED FILL — ?DIVALPROEX SOD ER 500MG TA: 500 | 30 days supply | Qty: 90 | Fill #0

## 2020-02-26 ENCOUNTER — Other Ambulatory Visit: Payer: Self-pay | Admitting: Nurse Practitioner

## 2020-02-26 ENCOUNTER — Other Ambulatory Visit: Payer: Self-pay

## 2020-02-26 ENCOUNTER — Encounter: Payer: Self-pay | Admitting: Nurse Practitioner

## 2020-02-26 ENCOUNTER — Ambulatory Visit: Payer: Self-pay | Attending: Nurse Practitioner | Admitting: Nurse Practitioner

## 2020-02-26 VITALS — BP 132/74 | HR 68 | Temp 97.7°F | Wt 180.0 lb

## 2020-02-26 DIAGNOSIS — R3121 Asymptomatic microscopic hematuria: Secondary | ICD-10-CM

## 2020-02-26 DIAGNOSIS — R809 Proteinuria, unspecified: Secondary | ICD-10-CM

## 2020-02-26 DIAGNOSIS — E1029 Type 1 diabetes mellitus with other diabetic kidney complication: Secondary | ICD-10-CM

## 2020-02-26 DIAGNOSIS — Z1159 Encounter for screening for other viral diseases: Secondary | ICD-10-CM

## 2020-02-26 DIAGNOSIS — I10 Essential (primary) hypertension: Secondary | ICD-10-CM

## 2020-02-26 DIAGNOSIS — E108 Type 1 diabetes mellitus with unspecified complications: Secondary | ICD-10-CM

## 2020-02-26 LAB — POCT URINALYSIS DIP (CLINITEK)
Bilirubin, UA: NEGATIVE
Glucose, UA: >=1000 mg/dL — AB
Ketones, POC UA: NEGATIVE mg/dL
Leukocytes, UA: NEGATIVE
Nitrite, UA: NEGATIVE
POC PROTEIN,UA: 100 — AB
Spec Grav, UA: 1.015 (ref 1.010–1.025)
Urobilinogen, UA: 2 E.U./dL — AB
pH, UA: 6 (ref 5.0–8.0)

## 2020-02-26 LAB — POCT GLYCOSYLATED HEMOGLOBIN (HGB A1C): Hemoglobin A1C: 12.7 % — AB (ref 4.0–5.6)

## 2020-02-26 LAB — GLUCOSE, POCT (MANUAL RESULT ENTRY)
POC Glucose: 451 mg/dl — AB (ref 70–99)
POC Glucose: 580 mg/dl — AB (ref 70–99)

## 2020-02-26 MED ORDER — GLUCOSE BLOOD VI STRP: ORAL_STRIP | 0 refills | Status: DC

## 2020-02-26 MED ORDER — BASAGLAR KWIKPEN 100 UNIT/ML ~~LOC~~ SOPN: 18.0000 [IU] | PEN_INJECTOR | Freq: Every day | SUBCUTANEOUS | 0 refills | Status: DC

## 2020-02-26 MED ORDER — LISINOPRIL 20 MG PO TABS: 20.0000 mg | ORAL_TABLET | Freq: Every day | ORAL | 1 refills | Status: DC

## 2020-02-26 MED ORDER — SIMVASTATIN 20 MG PO TABS: 20.0000 mg | ORAL_TABLET | Freq: Every day | ORAL | 1 refills | Status: DC

## 2020-02-26 MED ORDER — BD PEN NEEDLE MINI U/F 31G X 5 MM MISC: 1 refills | Status: DC

## 2020-02-26 MED ORDER — INSULIN ASPART 100 UNIT/ML ~~LOC~~ SOLN
20.0000 [IU] | Freq: Once | SUBCUTANEOUS | Status: AC
  Administered 2020-02-26: 20 [IU] via SUBCUTANEOUS

## 2020-02-26 MED ORDER — INSULIN LISPRO (1 UNIT DIAL) 100 UNIT/ML (KWIKPEN): PEN_INJECTOR | SUBCUTANEOUS | 0 refills | Status: DC

## 2020-02-26 MED FILL — TRUE METRIX TEST STRIP: 30 days supply | Qty: 100 | Fill #0

## 2020-02-26 NOTE — Progress Notes (Signed)
Assessment & Plan:  Donald was seen today for follow-up.  Diagnoses and all orders for this visit:  DM (diabetes mellitus), type 1 with complications (HCC) -     Glucose (CBG) -     HgB A1c -     Microalbumin/Creatinine Ratio, Urine -     insulin aspart (novoLOG) injection 20 Units -     Glucose (CBG) -     insulin lispro (HUMALOG KWIKPEN) 100 UNIT/ML KwikPen; INJECT 1-4 UNITS INTO THE SKIN 3 (THREE) TIMES DAILY BEFORE MEALS. IF SUGAR > 120 -     Insulin Glargine (BASAGLAR KWIKPEN) 100 UNIT/ML; Inject 0.18 mLs (18 Units total) into the skin daily. -     simvastatin (ZOCOR) 20 MG tablet; Take 1 tablet (20 mg total) by mouth at bedtime. -     glucose blood test strip; Use as instructed. MUST MAKE APPT FOR FURTHER REFILLS -     Insulin Pen Needle (B-D UF III MINI PEN NEEDLES) 31G X 5 MM MISC; Insert into the skin as prescribed Continue blood sugar control as discussed in office today, low carbohydrate diet, and regular physical exercise as tolerated, 150 minutes per week (30 min each day, 5 days per week, or 50 min 3 days per week). Keep blood sugar logs with fasting goal of 90-130 mg/dl, post prandial (after you eat) less than 180.  For Hypoglycemia: BS <60 and Hyperglycemia BS >400; contact the clinic ASAP. Annual eye exams and foot exams are recommended.   Need for hepatitis C screening test -     Hepatitis C Antibody  Essential hypertension -     lisinopril (ZESTRIL) 20 MG tablet; Take 1 tablet (20 mg total) by mouth at bedtime. Continue all antihypertensives as prescribed.  Remember to bring in your blood pressure log with you for your follow up appointment.  DASH/Mediterranean Diets are healthier choices for HTN.    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    Patient has been counseled on age-appropriate routine health concerns for screening and prevention. These are  reviewed and up-to-date. Referrals have been placed accordingly. Immunizations are up-to-date or declined.    Subjective:   Chief Complaint  Patient presents with  . Follow-up    Pt. is here for diabetes follow up.    HPI [name] 44 y.o. male presents to office today for f/u DM1   Diabetes is Poorly controlled. He states he will be getting insurance next month and at that time we will refer to an endocrinologist. He is not eating properly or taking his insulin as prescribed. Taking insulin based on how he feels and if he eats although blood glucose levels are running high despite him missing meals per his report.  He is not monitoring his blood sugars post prandial. Drinking gatorade and eating trail mix and candy bars. Current medications include basaglar 18 units daily, humalog SSI. Lab Results  Component Value Date   HGBA1C 12.7 (A) 02/26/2020   Essential Hypertension Taking lisinopril 20mg  daily. Denies chest pain, shortness of breath, palpitations, lightheadedness, dizziness, headaches or BLE edema.  BP Readings from Last 3 Encounters:  02/26/20 132/74  12/21/19 130/60  04/03/19 133/77   Dyslipidemia LDL not at goal of <70. Taking zocor 20mg  daily as prescribed.  Lab Results  Component Value Date   LDLCALC 98 02/26/2020   Review of Systems  Constitutional: Negative for fever, malaise/fatigue and weight loss.  HENT: Negative.  Negative for nosebleeds.   Eyes: Negative.  Negative for blurred vision, double vision and photophobia.  Respiratory: Negative.  Negative for cough and shortness of breath.   Cardiovascular: Negative.  Negative for chest pain, palpitations and leg swelling.  Gastrointestinal: Negative.  Negative for heartburn, nausea and vomiting.  Musculoskeletal: Negative.  Negative for myalgias.  Neurological: Negative.  Negative for dizziness, focal weakness, seizures and headaches.  Psychiatric/Behavioral: Negative.  Negative for suicidal ideas.    Past  Medical History:  Diagnosis Date  . Diabetes mellitus   . Hypertension   . Seizures (HCC)     Past Surgical History:  Procedure Laterality Date  . HERNIA REPAIR      Family History  Problem Relation Age of Onset  . Hypertension Mother   . Diabetes Paternal Uncle     Social History Reviewed with no changes to be made today.   Outpatient Medications Prior to Visit  Medication Sig Dispense Refill  . aspirin 81 MG tablet Take 81 mg by mouth at bedtime.     . divalproex (DEPAKOTE ER) 500 MG 24 hr tablet TAKE 3 TABLETS BY MOUTH EVERY NIGHT 90 tablet 3  . Omega-3 Fatty Acids (FISH OIL PO) Take 1 capsule by mouth daily.    . traZODone (DESYREL) 50 MG tablet Take 1 tablet (50 mg total) by mouth at bedtime as needed for sleep. 30 tablet 3  . TRUEplus Lancets 28G MISC 1 each by Does not apply route 3 (three) times daily. 100 each 11  . glucose blood test strip Use as instructed. MUST MAKE APPT FOR FURTHER REFILLS 100 each 0  . Insulin Glargine (BASAGLAR KWIKPEN) 100 UNIT/ML Inject 0.18 mLs (18 Units total) into the skin daily. 6 mL 0  . insulin lispro (HUMALOG KWIKPEN) 100 UNIT/ML KwikPen INJECT 1-4 UNITS INTO THE SKIN 3 (THREE) TIMES DAILY BEFORE MEALS. IF SUGAR > 120 15 mL 0  . Insulin Pen Needle (B-D UF III MINI PEN NEEDLES) 31G X 5 MM MISC Insert into the skin as prescribed 90 each 1  . Insulin Syringe-Needle U-100 (B-D INS SYRINGE 0.5CC/31GX5/16) 31G X 5/16" 0.5 ML MISC 1 each by Does not apply route 3 (three) times daily. 90 each 11  . lisinopril (ZESTRIL) 20 MG tablet Take 1 tablet (20 mg total) by mouth every morning. (Patient taking differently: Take 20 mg by mouth at bedtime. ) 90 tablet 3  . simvastatin (ZOCOR) 20 MG tablet Take 1 tablet (20 mg total) by mouth daily. (Patient taking differently: Take 20 mg by mouth at bedtime. ) 90 tablet 3  . albuterol (VENTOLIN HFA) 108 (90 Base) MCG/ACT inhaler Inhale 1-2 puffs into the lungs every 6 (six) hours as needed for wheezing or  shortness of breath. (Patient not taking: Reported on 02/26/2020) 18 g 1  . amLODipine (NORVASC) 5 MG tablet Take 1 tablet (5 mg total) by mouth daily. (Patient not taking: Reported on 02/26/2020) 90 tablet 3   No facility-administered medications prior to visit.    Allergies  Allergen Reactions  . Geodon [Ziprasidone Hcl] Other (See Comments)    Severe muscle spasms (ENTIRE BODY)- had to call EMS  . Ziprasidone Mesylate Other (See Comments)    "severe muscle spasms"- Had to call EMS       Objective:    BP 132/74 (BP Location: Right Arm, Patient Position: Sitting, Cuff Size: Normal)   Pulse 68   Temp 97.7 F (  36.5 C) (Temporal)   Wt 180 lb (81.6 kg)   SpO2 96%   BMI 25.10 kg/m  Wt Readings from Last 3 Encounters:  02/26/20 180 lb (81.6 kg)  12/21/19 188 lb (85.3 kg)  04/03/19 192 lb 6.4 oz (87.3 kg)    Physical Exam Vitals and nursing note reviewed.  Constitutional:      Appearance: He is well-developed.  HENT:     Head: Normocephalic and atraumatic.  Cardiovascular:     Rate and Rhythm: Normal rate and regular rhythm.     Heart sounds: Normal heart sounds. No murmur heard.  No friction rub. No gallop.   Pulmonary:     Effort: Pulmonary effort is normal. No tachypnea or respiratory distress.     Breath sounds: Normal breath sounds. No decreased breath sounds, wheezing, rhonchi or rales.  Chest:     Chest wall: No tenderness.  Abdominal:     General: Bowel sounds are normal.     Palpations: Abdomen is soft.  Musculoskeletal:        General: Normal range of motion.     Cervical back: Normal range of motion.  Skin:    General: Skin is warm and dry.  Neurological:     Mental Status: He is alert and oriented to person, place, and time.     Coordination: Coordination normal.  Psychiatric:        Behavior: Behavior normal. Behavior is cooperative.        Thought Content: Thought content normal.        Judgment: Judgment normal.          Patient has been  counseled extensively about nutrition and exercise as well as the importance of adherence with medications and regular follow-up. The patient was given clear instructions to go to ER or return to medical center if symptoms don't improve, worsen or new problems develop. The patient verbalized understanding.   Follow-up: Return in about 3 months (around 05/28/2020).   Claiborne Rigg, FNP-BC [facility] [place], [place] [phone]   02/26/2020, 4:35 PM

## 2020-02-27 LAB — URINALYSIS, COMPLETE
Bilirubin, UA: NEGATIVE
Ketones, UA: NEGATIVE
Leukocytes,UA: NEGATIVE
Nitrite, UA: NEGATIVE
RBC, UA: NEGATIVE
Specific Gravity, UA: >=1.03 — AB (ref 1.005–1.030)
Urobilinogen, Ur: 1 mg/dL (ref 0.2–1.0)
pH, UA: 6 (ref 5.0–7.5)

## 2020-02-27 LAB — CMP14+EGFR
ALT: 14 IU/L (ref 0–44)
AST: 29 IU/L (ref 0–40)
Albumin/Globulin Ratio: 1.9 (ref 1.2–2.2)
Albumin: 4.1 g/dL (ref 4.0–5.0)
Alkaline Phosphatase: 57 IU/L (ref 48–121)
BUN/Creatinine Ratio: 13 (ref 9–20)
BUN: 18 mg/dL (ref 6–24)
Bilirubin Total: 0.3 mg/dL (ref 0.0–1.2)
CO2: 24 mmol/L (ref 20–29)
Calcium: 8.7 mg/dL (ref 8.7–10.2)
Chloride: 98 mmol/L (ref 96–106)
Creatinine, Ser: 1.4 mg/dL — ABNORMAL HIGH (ref 0.76–1.27)
GFR calc Af Amer: 71 mL/min/{1.73_m2} (ref >59)
GFR calc non Af Amer: 62 mL/min/{1.73_m2} (ref >59)
Globulin, Total: 2.2 g/dL (ref 1.5–4.5)
Glucose: 568 mg/dL (ref 65–99)
Potassium: 4.7 mmol/L (ref 3.5–5.2)
Sodium: 134 mmol/L (ref 134–144)
Total Protein: 6.3 g/dL (ref 6.0–8.5)

## 2020-02-27 LAB — MICROSCOPIC EXAMINATION
Bacteria, UA: NONE SEEN
Casts: NONE SEEN /lpf
Epithelial Cells (non renal): NONE SEEN /hpf (ref 0–10)
RBC, Urine: NONE SEEN /hpf (ref 0–2)
WBC, UA: NONE SEEN /hpf (ref 0–5)

## 2020-02-27 LAB — LIPID PANEL
Chol/HDL Ratio: 3.3 ratio (ref 0.0–5.0)
Cholesterol, Total: 164 mg/dL (ref 100–199)
HDL: 49 mg/dL (ref >39)
LDL Chol Calc (NIH): 98 mg/dL (ref 0–99)
Triglycerides: 90 mg/dL (ref 0–149)
VLDL Cholesterol Cal: 17 mg/dL (ref 5–40)

## 2020-02-27 LAB — MICROALBUMIN / CREATININE URINE RATIO
Creatinine, Urine: 80.9 mg/dL
Microalb/Creat Ratio: 361 mg/g creat — ABNORMAL HIGH (ref 0–29)
Microalbumin, Urine: 291.9 ug/mL

## 2020-02-27 LAB — HEPATITIS C ANTIBODY: Hep C Virus Ab: <0.1 s/co ratio (ref 0.0–0.9)

## 2020-02-27 NOTE — Progress Notes (Signed)
Critical glucose of 568 received from after hours service. Patient received 20 units of Novolog in clinic after lab draw and meds were refilled. No additional action needed.

## 2020-02-28 ENCOUNTER — Encounter: Payer: Self-pay | Admitting: Nurse Practitioner

## 2020-02-28 MED ORDER — INSULIN LISPRO (1 UNIT DIAL) 100 UNIT/ML (KWIKPEN): PEN_INJECTOR | SUBCUTANEOUS | 0 refills | Status: DC

## 2020-02-29 MED FILL — ?HUMALOG 100 UNITS/ML KWIKP: 100 | 30 days supply | Qty: 9 | Fill #0

## 2020-03-11 MED FILL — ?BASAGLAR 100 UNITS/ML KWPE: 100 | 33 days supply | Qty: 6 | Fill #0

## 2020-03-21 ENCOUNTER — Other Ambulatory Visit: Payer: Self-pay | Admitting: Nurse Practitioner

## 2020-03-21 DIAGNOSIS — E108 Type 1 diabetes mellitus with unspecified complications: Secondary | ICD-10-CM

## 2020-03-21 MED ORDER — INSULIN LISPRO (1 UNIT DIAL) 100 UNIT/ML (KWIKPEN): PEN_INJECTOR | SUBCUTANEOUS | 0 refills | Status: DC

## 2020-03-21 NOTE — Telephone Encounter (Signed)
Pt request refill  insulin lispro (HUMALOG KWIKPEN) 100 UNIT/ML [facility] - [place], [place] - [place] [address] [employer] Phone:  [phone]  Fax:  [phone]     Pt has about 2 weeks left, but did not want to be w/out his medication.  Would like you to send with a future pickup date.

## 2020-03-22 ENCOUNTER — Other Ambulatory Visit: Payer: Self-pay | Admitting: Nurse Practitioner

## 2020-03-22 ENCOUNTER — Other Ambulatory Visit: Payer: Self-pay

## 2020-03-22 ENCOUNTER — Ambulatory Visit (HOSPITAL_COMMUNITY)
Admission: EM | Admit: 2020-03-22 | Discharge: 2020-03-22 | Disposition: A | Discharge status: Home / Self Care | Payer: Self-pay | Attending: Physician Assistant | Admitting: Physician Assistant

## 2020-03-22 ENCOUNTER — Encounter (HOSPITAL_COMMUNITY): Payer: Self-pay | Admitting: Emergency Medicine

## 2020-03-22 DIAGNOSIS — E108 Type 1 diabetes mellitus with unspecified complications: Secondary | ICD-10-CM

## 2020-03-22 DIAGNOSIS — K0889 Other specified disorders of teeth and supporting structures: Secondary | ICD-10-CM

## 2020-03-22 DIAGNOSIS — K047 Periapical abscess without sinus: Secondary | ICD-10-CM

## 2020-03-22 MED ORDER — IBUPROFEN 800 MG PO TABS: 800.0000 mg | ORAL_TABLET | Freq: Three times a day (TID) | ORAL | 0 refills | Status: DC

## 2020-03-22 MED ORDER — INSULIN LISPRO (1 UNIT DIAL) 100 UNIT/ML (KWIKPEN): PEN_INJECTOR | SUBCUTANEOUS | 6 refills | Status: DC

## 2020-03-22 MED ORDER — ACETAMINOPHEN 325 MG PO TABS: 650.0000 mg | ORAL_TABLET | Freq: Four times a day (QID) | ORAL | 0 refills | Status: DC | PRN

## 2020-03-22 MED ORDER — AMOXICILLIN-POT CLAVULANATE 875-125 MG PO TABS: 1.0000 | ORAL_TABLET | Freq: Two times a day (BID) | ORAL | 0 refills | Status: AC

## 2020-03-22 MED ORDER — CHLORHEXIDINE GLUCONATE 0.12 % MT SOLN: 15.0000 mL | Freq: Two times a day (BID) | OROMUCOSAL | 0 refills | Status: DC

## 2020-03-22 MED FILL — AMOX-CLAV 875-125 MG TABLET: 875-125 | 10 days supply | Qty: 20 | Fill #0

## 2020-03-22 MED FILL — CHLORHEXIDINE 0.12% RINSE: 0.12 | 14 days supply | Qty: 473 | Fill #0

## 2020-03-22 MED FILL — IBUPROFEN 800 MG TABLET: 800 | 7 days supply | Qty: 21 | Fill #0

## 2020-03-22 MED FILL — ?HUMALOG 100 UNITS/ML KWIKP: 100 | 3 days supply | Qty: 9 | Fill #0

## 2020-03-22 NOTE — ED Provider Notes (Signed)
[facility]    CSN: [id number] Arrival date & time: 03/22/20  1311      History   Chief Complaint Chief Complaint  Patient presents with  . Dental Pain    HPI [name] is a 44 y.o. male.   Patient reports for left upper tooth pain.  Reports the tooth has been cracked for a while but was eating almonds on Thursday last week when the tooth cracked more.  Reports is painful prior to that.  Reports some swelling around it.  Reports he pain to the point where he has to eat soft foods.  He has not any fevers or difficulty swallowing.  Does not have a dentist.  Does have primary care.     Past Medical History:  Diagnosis Date  . Diabetes mellitus   . Hypertension   . Seizures [facility])     Patient Active Problem List   Diagnosis Date Noted  . Hypoglycemia due to insulin 04/02/2019  . CKD (chronic kidney disease), stage III 04/02/2019  . Prolonged QT interval 04/02/2019  . 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 Knee pain 03/16/2016  . Scrotal pain 01/13/2016  . Seizure disorder (HCC) 03/10/2015  . 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 UNSPECIFIED VITAMIN D DEFICIENCY 06/16/2009  . PTSD (post-traumatic stress disorder) 03/26/2008  . Essential hypertension, benign 03/24/2008  . SLEEP DISORDER 03/24/2008  . SLEEP DEPRIVATION 02/09/2008  . DM (diabetes mellitus), type 1 with complications (HCC) 04/04/2007    Past Surgical History:  Procedure Laterality Date  . HERNIA REPAIR         Home Medications    Prior to Admission medications   Medication Sig Start Date End Date Taking? Authorizing Provider  acetaminophen (TYLENOL) 325 MG tablet Take 2 tablets (650 mg total) by mouth every 6 (six) hours as needed. 03/22/20   Dortha Neighbors, Veryl Speak, PA-C  albuterol (VENTOLIN HFA) 108 (90 Base) MCG/ACT inhaler Inhale 1-2 puffs into the lungs every 6 (six) hours as needed for  wheezing or shortness of breath. Patient not taking: Reported on 02/26/2020 04/01/19   [name], PA-C  amoxicillin-clavulanate (AUGMENTIN) 875-125 MG tablet Take 1 tablet by mouth every 12 (twelve) hours for 10 days. 03/22/20 04/01/20  Leonard Hendler, Veryl Speak, PA-C  aspirin 81 MG tablet Take 81 mg by mouth at bedtime.     [provider]  chlorhexidine (PERIDEX) 0.12 % solution Use as directed 15 mLs in the mouth or throat 2 (two) times daily. 03/22/20   Majesta Leichter, Veryl Speak, PA-C  divalproex (DEPAKOTE ER) 500 MG 24 hr tablet TAKE 3 TABLETS BY MOUTH EVERY NIGHT 01/18/20   Van Clines, MD  glucose blood test strip Use as instructed. MUST MAKE APPT FOR FURTHER REFILLS 02/26/20   Claiborne Rigg, NP  ibuprofen (ADVIL) 800 MG tablet Take 1 tablet (800 mg total) by mouth 3 (three) times daily. 03/22/20   Chosen Garron, Veryl Speak, PA-C  Insulin Glargine (BASAGLAR KWIKPEN) 100 UNIT/ML Inject 0.18 mLs (18 Units total) into the skin daily. 02/26/20   Claiborne Rigg, NP  insulin lispro (HUMALOG KWIKPEN) 100 UNIT/ML KwikPen Monitor blood glucose TID. For blood sugars 0-150 give 0 units of insulin, 151-200 give 2 units of insulin, 201-250 give 4 units, 251-300 give 6 units, 301-350 give 8 units, 351-400 give 10 units,> 400 give 12 units and call M.D. Discussed hypoglycemia protocol. 03/22/20   Bertram Denver  W, NP  Insulin Pen Needle (B-D UF III MINI PEN NEEDLES) 31G X 5 MM MISC Insert into the skin as prescribed 02/26/20   Claiborne Rigg, NP  lisinopril (ZESTRIL) 20 MG tablet Take 1 tablet (20 mg total) by mouth at bedtime. 02/26/20 05/26/20  Claiborne Rigg, NP  Omega-3 Fatty Acids (FISH OIL PO) Take 1 capsule by mouth daily.    [provider]  simvastatin (ZOCOR) 20 MG tablet Take 1 tablet (20 mg total) by mouth at bedtime. 02/26/20 05/26/20  Claiborne Rigg, NP  traZODone (DESYREL) 50 MG tablet Take 1 tablet (50 mg total) by mouth at bedtime as needed for sleep. 04/01/19   [name], PA-C  TRUEplus  Lancets 28G MISC 1 each by Does not apply route 3 (three) times daily. 04/01/19   [name], PA-C    Family History Family History  Problem Relation Age of Onset  . Hypertension Mother   . Diabetes Paternal Uncle     Social History Social History   Tobacco Use  . Smoking status: Never Smoker  . Smokeless tobacco: Never Used  Vaping Use  . Vaping Use: Never used  Substance Use Topics  . Alcohol use: Yes    Alcohol/week: 1.0 standard drink    Types: 1 Glasses of wine per week    Comment: rarely  . Drug use: No     Allergies   Geodon [ziprasidone hcl] and Ziprasidone mesylate   Review of Systems Review of Systems   Physical Exam Triage Vital Signs ED Triage Vitals  Enc Vitals Group     BP      Pulse      Resp      Temp      Temp src      SpO2      Weight      Height      Head Circumference      Peak Flow      Pain Score      Pain Loc      Pain Edu?      Excl. in GC?    No data found.  Updated Vital Signs BP 136/73 (BP Location: Left Arm)   Pulse 68   Temp 98.3 F (36.8 C) (Oral)   Resp 16   SpO2 100%   Visual Acuity Right Eye Distance:   Left Eye Distance:   Bilateral Distance:    Right Eye Near:   Left Eye Near:    Bilateral Near:     Physical Exam Vitals and nursing note reviewed.  Constitutional:      Appearance: He is well-developed.  HENT:     Head: Normocephalic and atraumatic.     Mouth/Throat:      Comments: No trismus.  Controlling secretions. Eyes:     Conjunctiva/sclera: Conjunctivae normal.  Neck:     Comments: No submental or submandibular swelling. Cardiovascular:     Rate and Rhythm: Normal rate.     Heart sounds: No murmur heard.   Pulmonary:     Effort: Pulmonary effort is normal.  Musculoskeletal:     Cervical back: Neck supple.  Skin:    General: Skin is warm and dry.  Neurological:     Mental Status: He is alert.      UC Treatments / Results  Labs (all labs ordered are listed, but only  abnormal results are displayed) Labs Reviewed - No data to display  EKG   Radiology No results  found.  Procedures Procedures (including critical care time)  Medications Ordered in UC Medications - No data to display  Initial Impression / Assessment and Plan / UC Course  I have reviewed the triage vital signs and the nursing notes.  Pertinent labs & imaging results that were available during my care of the patient were reviewed by me and considered in my medical decision making (see chart for details).     #Dental pain #Dental infection #Tooth pain Patient is a 44 year old presenting with dental infection after cracked tooth.  Start on Augmentin.  Tylenol and ibuprofen as patient does not work last charted medicines.  Also put on Peridex.  Multiple dental follow-up options given.  Also encouraged follow-up with primary care.  Return and emergency department precautions given.  Patient verbalized understand plan of care Final Clinical Impressions(s) / UC Diagnoses   Final diagnoses:  Pain, dental  Dental infection  Tooth pain     Discharge Instructions     Take medications as prescribed  consume soft foods You need to see a dentist  I have given you the on-call dentist number, Dr. Rocky Morel call this number You may also call urgent tooth at Phone: [phone]  You may also consider scheduling follow-up with your primary care  If you develop high fever, unable to swallow or significant swelling underneath your neck or chin go to the emergency department       ED Prescriptions    Medication Sig Dispense Auth. Provider   chlorhexidine (PERIDEX) 0.12 % solution Use as directed 15 mLs in the mouth or throat 2 (two) times daily. 120 mL Suriya Kovarik, Veryl Speak, PA-C   amoxicillin-clavulanate (AUGMENTIN) 875-125 MG tablet Take 1 tablet by mouth every 12 (twelve) hours for 10 days. 20 tablet Korri Ask, Veryl Speak, PA-C   ibuprofen (ADVIL) 800 MG tablet Take 1 tablet (800 mg total) by mouth  3 (three) times daily. 21 tablet Amee Boothe, Veryl Speak, PA-C   acetaminophen (TYLENOL) 325 MG tablet Take 2 tablets (650 mg total) by mouth every 6 (six) hours as needed. 30 tablet Reason Helzer, Veryl Speak, PA-C     PDMP not reviewed this encounter.   Hermelinda Medicus, PA-C 03/22/20 1627

## 2020-03-22 NOTE — ED Triage Notes (Signed)
Pt presents with tooth pain. States had cracked tooth and bit down on almonds and states tooth is "dangling"

## 2020-03-22 NOTE — Discharge Instructions (Signed)
Take medications as prescribed  consume soft foods You need to see a dentist  I have given you the on-call dentist number, Dr. Rocky Morel call this number You may also call urgent tooth at Phone: [phone]  You may also consider scheduling follow-up with your primary care  If you develop high fever, unable to swallow or significant swelling underneath your neck or chin go to the emergency department

## 2020-04-08 ENCOUNTER — Other Ambulatory Visit: Payer: Self-pay | Admitting: Physician Assistant

## 2020-04-08 ENCOUNTER — Other Ambulatory Visit: Payer: Self-pay | Admitting: Pharmacist

## 2020-04-08 DIAGNOSIS — E108 Type 1 diabetes mellitus with unspecified complications: Secondary | ICD-10-CM

## 2020-04-08 MED ORDER — NOVOLOG FLEXPEN 100 UNIT/ML ~~LOC~~ SOPN: PEN_INJECTOR | SUBCUTANEOUS | 2 refills | Status: DC

## 2020-04-08 MED FILL — SIMVASTATIN 20 MG TABLET: 20 | 30 days supply | Qty: 30 | Fill #0

## 2020-04-08 MED FILL — DIVALPROEX SOD ER 500 MG TA: 500 | 30 days supply | Qty: 90 | Fill #1

## 2020-04-08 MED FILL — NOVOLOG FLEXPEN SYRINGE: 100 | 25 days supply | Qty: 9 | Fill #0

## 2020-04-08 MED FILL — LANTUS SOLOSTAR 100 UNITS/M: 100 | 8 days supply | Qty: 3 | Fill #0

## 2020-04-08 NOTE — Telephone Encounter (Signed)
Requested Prescriptions  Pending Prescriptions Disp Refills  . Insulin Glargine (BASAGLAR KWIKPEN) 100 UNIT/ML [Pharmacy Med Name: BASAGLAR 100 UNITS/ML KWPE 100 Solution Pen-injector] 3 mL 1    Sig: INJECT 17 UNITS INTO THE SKIN TWICE DAILY     Endocrinology:  Diabetes - Insulins Failed - 04/08/2020 10:04 AM      Failed - HBA1C is between 0 and 7.9 and within 180 days    Hemoglobin A1C  Date Value Ref Range Status  02/26/2020 12.7 (A) 4.0 - 5.6 % Final   Hgb A1c MFr Bld  Date Value Ref Range Status  10/28/2018 9.6 (H) 4.8 - 5.6 % Final    Comment:             Prediabetes: 5.7 - 6.4          Diabetes: >6.4          Glycemic control for adults with diabetes: <7.0          Passed - Valid encounter within last 6 months    Recent Outpatient Visits          1 month ago DM (diabetes mellitus), type 1 with complications [facility])   [facility] [place], [place] W, NP   8 months ago Poorly controlled type 1 diabetes mellitus with complication [place])   [facility] [place], Shea Stakes, NP   1 year ago DM (diabetes mellitus), type 1 with complications [facility])   [facility] [place], Marzella Schlein, [place]   1 year ago Essential hypertension   [facility] [place], [place] W, NP   1 year ago DM (diabetes mellitus), type 1 with complications [facility])   [facility] [place], Shea Stakes, NP      Future Appointments            In 6 days Sharon Seller, Marzella Schlein, PA-C [facility]

## 2020-04-13 NOTE — Progress Notes (Deleted)
Patient ID: [name], male   DOB: 1975/08/13, 44 y.o.   MRN: [id number]   After ED visit 03/22/2020  From ED note: #Dental pain #Dental infection #Tooth pain Patient is a 44 year old presenting with dental infection after cracked tooth.  Start on Augmentin.  Tylenol and ibuprofen as patient does not work last charted medicines.  Also put on Peridex.  Multiple dental follow-up options given.  Also encouraged follow-up with primary care.

## 2020-04-14 ENCOUNTER — Ambulatory Visit: Payer: Self-pay | Admitting: Physician Assistant

## 2020-04-25 ENCOUNTER — Telehealth: Payer: Self-pay | Admitting: Nurse Practitioner

## 2020-04-25 NOTE — Telephone Encounter (Signed)
Pt requests referral to endocrinology. Please refer if indicated.

## 2020-04-25 NOTE — Telephone Encounter (Signed)
Patient is  Calling because he got his insurance back and is requesting a referral to endocrology Please advise Cb- [phone]

## 2020-04-28 NOTE — Telephone Encounter (Signed)
[name] has he been approved for CAFA or have insurance now?

## 2020-04-29 NOTE — Telephone Encounter (Signed)
Patient have [employer]. New insurance updated.

## 2020-05-01 ENCOUNTER — Other Ambulatory Visit: Payer: Self-pay | Admitting: Nurse Practitioner

## 2020-05-01 DIAGNOSIS — E108 Type 1 diabetes mellitus with unspecified complications: Secondary | ICD-10-CM

## 2020-05-02 MED FILL — LANTUS SOLOSTAR 100 UNITS/M: 100 | 8 days supply | Qty: 3 | Fill #1

## 2020-05-23 ENCOUNTER — Other Ambulatory Visit: Payer: Self-pay | Admitting: Nurse Practitioner

## 2020-05-23 DIAGNOSIS — E108 Type 1 diabetes mellitus with unspecified complications: Secondary | ICD-10-CM

## 2020-05-23 MED FILL — DIVALPROEX SOD ER 500 MG TA: 500 | 30 days supply | Qty: 90 | Fill #2

## 2020-05-23 NOTE — Telephone Encounter (Signed)
Copied from CRM [id number]. Topic: General - Other >> May 23, 2020  3:07 PM Jaquita Rector A wrote: Reason for CRM: Patient called to inquire of [name] to please send an rx to the Pharmacy for his Lantus because he say that his new insurance cover Lantus and not the [employer]. Per patient he is out and need this Rx sent today please if possible. Any questions please call ph# [phone]

## 2020-05-23 NOTE — Telephone Encounter (Signed)
Will route to PCP 

## 2020-05-23 NOTE — Telephone Encounter (Signed)
Routing to [facility].

## 2020-05-24 MED FILL — LANTUS SOLOSTAR 100 UNITS/M: 100 | 8 days supply | Qty: 3 | Fill #0

## 2020-05-25 MED FILL — LISINOPRIL 20 MG TABLET: 20 | 30 days supply | Qty: 30 | Fill #0

## 2020-05-27 NOTE — Telephone Encounter (Signed)
Attempt to call patient to inform his lantus has been refill.  No answer and LVM.

## 2020-06-09 MED FILL — LANTUS SOLOSTAR 100 UNITS/M: 100 | 8 days supply | Qty: 3 | Fill #1

## 2020-06-28 ENCOUNTER — Other Ambulatory Visit: Payer: Self-pay | Admitting: Nurse Practitioner

## 2020-06-28 DIAGNOSIS — E108 Type 1 diabetes mellitus with unspecified complications: Secondary | ICD-10-CM

## 2020-06-28 MED FILL — LANTUS SOLOSTAR 100 UNITS/M: 100 | 16 days supply | Qty: 6 | Fill #0

## 2020-06-28 MED FILL — NOVOLOG FLEXPEN SYRINGE: 100 | 25 days supply | Qty: 9 | Fill #1

## 2020-06-28 MED FILL — DIVALPROEX SOD ER 500 MG TA: 500 | 30 days supply | Qty: 90 | Fill #3

## 2020-07-18 ENCOUNTER — Other Ambulatory Visit: Payer: Self-pay

## 2020-07-18 ENCOUNTER — Other Ambulatory Visit: Payer: BC Managed Care – PPO

## 2020-07-18 DIAGNOSIS — Z20822 Contact with and (suspected) exposure to covid-19: Secondary | ICD-10-CM | POA: Diagnosis not present

## 2020-07-18 DIAGNOSIS — Z1152 Encounter for screening for COVID-19: Secondary | ICD-10-CM | POA: Diagnosis not present

## 2020-07-21 LAB — NOVEL CORONAVIRUS, NAA: SARS-CoV-2, NAA: NOT DETECTED

## 2020-08-09 ENCOUNTER — Other Ambulatory Visit: Payer: Self-pay | Admitting: Nurse Practitioner

## 2020-08-09 DIAGNOSIS — G40309 Generalized idiopathic epilepsy and epileptic syndromes, not intractable, without status epilepticus: Secondary | ICD-10-CM

## 2020-08-09 MED ORDER — NOVOLOG FLEXPEN 100 UNIT/ML ~~LOC~~ SOPN: PEN_INJECTOR | SUBCUTANEOUS | 2 refills | Status: DC

## 2020-08-09 MED FILL — NOVOLOG FLEXPEN SYRINGE: 100 | 30 days supply | Qty: 15 | Fill #0

## 2020-08-09 NOTE — Telephone Encounter (Signed)
Copied from CRM [id number]. Topic: Quick Communication - Rx Refill/Question >> Aug 09, 2020 11:00 AM Mcneil, Ja-Kwan wrote: Medication: divalproex (DEPAKOTE ER) 500 MG 24 hr tablet, insulin aspart (NOVOLOG FLEXPEN) 100 UNIT/ML FlexPen, and LANTUS SOLOSTAR 100 UNIT/ML Solostar Pen  Has the patient contacted their pharmacy? yes   Preferred Pharmacy (with phone number or street name): [facility] - [place], [place] - [place] [address]  Phone: [phone]   Fax: [phone]  Agent: Please be advised that RX refills may take up to 3 business days. We ask that you follow-up with your pharmacy.

## 2020-08-09 NOTE — Telephone Encounter (Signed)
Requested Prescriptions  Pending Prescriptions Disp Refills  . insulin aspart (NOVOLOG FLEXPEN) 100 UNIT/ML FlexPen 15 mL 2    Sig: Monitor blood glucose TID. For blood sugars 0-150 give 0 units of insulin, 151-200 give 2 units of insulin, 201-250 give 4 units, 251-300 give 6 units, 301-350 give 8 units, 351-400 give 10 units,> 400 give 12 units and call M.D. Discussed hypoglycemia protocol.     Endocrinology:  Diabetes - Insulins Failed - 08/09/2020 11:06 AM      Failed - HBA1C is between 0 and 7.9 and within 180 days    Hemoglobin A1C  Date Value Ref Range Status  02/26/2020 12.7 (A) 4.0 - 5.6 % Final   Hgb A1c MFr Bld  Date Value Ref Range Status  10/28/2018 9.6 (H) 4.8 - 5.6 % Final    Comment:             Prediabetes: 5.7 - 6.4          Diabetes: >6.4          Glycemic control for adults with diabetes: <7.0          Passed - Valid encounter within last 6 months    Recent Outpatient Visits          5 months ago DM (diabetes mellitus), type 1 with complications (HCC)   [facility] [place], Shea Stakes, NP   1 year ago Poorly controlled type 1 diabetes mellitus with complication [facility])   [place] [facility] [place], Shea Stakes, NP   1 year ago DM (diabetes mellitus), type 1 with complications [facility])   [place] [facility] [place], [place], [place]   1 year ago Essential hypertension   [place] [facility] [place], [place] W, NP   2 years ago DM (diabetes mellitus), type 1 with complications [facility])   [place] [facility] [place], [place] W, NP             . divalproex (DEPAKOTE ER) 500 MG 24 hr tablet 90 tablet 3    Sig: TAKE 3 TABLETS BY MOUTH EVERY NIGHT     Not Delegated - Neurology:  Anticonvulsants - Valproates Failed - 08/09/2020 11:06 AM      Failed - This refill cannot be delegated      Failed - HGB in normal range and within 360 days    Hemoglobin  Date Value Ref  Range Status  04/03/2019 12.8 (L) 13.0 - 17.0 g/dL Final  60/45/4098 11.9 (L) 13.0 - 17.7 g/dL Final         Failed - PLT in normal range and within 360 days    Platelets  Date Value Ref Range Status  04/03/2019 109 (L) 150 - 400 K/uL Final    Comment:    REPEATED TO VERIFY PLATELET COUNT CONFIRMED BY SMEAR Immature Platelet Fraction may be clinically indicated, consider ordering this additional test [id number]   10/28/2018 156 150 - 450 x10E3/uL Final         Failed - WBC in normal range and within 360 days    WBC  Date Value Ref Range Status  04/03/2019 5.9 4.0 - 10.5 K/uL Final         Failed - HCT in normal range and within 360 days    HCT  Date Value Ref Range Status  04/03/2019 38.9 (L) 39.0 - 52.0 % Final   Hematocrit  Date Value Ref Range  Status  10/28/2018 37.8 37.5 - 51.0 % Final         Failed - Valproic Acid (serum) in normal range and within 360 days    Valproic Acid Lvl  Date Value Ref Range Status  04/02/2019 63 50.0 - 100.0 ug/mL Final    Comment:    Performed at [facility], [address], [place], [place] [address]         Passed - AST in normal range and within 360 days    AST  Date Value Ref Range Status  02/26/2020 29 0 - 40 IU/L Final         Passed - ALT in normal range and within 360 days    ALT  Date Value Ref Range Status  02/26/2020 14 0 - 44 IU/L Final         Passed - Valid encounter within last 12 months    Recent Outpatient Visits          5 months ago DM (diabetes mellitus), type 1 with complications [facility])   [place] [facility] [place], [place] W, NP   1 year ago Poorly controlled type 1 diabetes mellitus with complication [facility])   [place] [facility] [place], Shea Stakes, NP   1 year ago DM (diabetes mellitus), type 1 with complications [facility])   [place] [facility] [place], Marzella [place], [place]   1 year ago Essential hypertension   [place] [address]  [facility] [place], [place] W, NP   2 years ago DM (diabetes mellitus), type 1 with complications [facility])   [place] [facility] [place], Shea Stakes, NP

## 2020-08-16 ENCOUNTER — Other Ambulatory Visit: Payer: Self-pay | Admitting: Nurse Practitioner

## 2020-08-16 ENCOUNTER — Other Ambulatory Visit: Payer: Self-pay | Admitting: Neurology

## 2020-08-16 DIAGNOSIS — E108 Type 1 diabetes mellitus with unspecified complications: Secondary | ICD-10-CM

## 2020-08-16 DIAGNOSIS — G40309 Generalized idiopathic epilepsy and epileptic syndromes, not intractable, without status epilepticus: Secondary | ICD-10-CM

## 2020-08-16 MED ORDER — DIVALPROEX SODIUM ER 500 MG PO TB24: ORAL_TABLET | ORAL | 3 refills | Status: DC

## 2020-08-16 MED ORDER — LANTUS SOLOSTAR 100 UNIT/ML ~~LOC~~ SOPN
PEN_INJECTOR | SUBCUTANEOUS | 1 refills | Status: DC
Start: 2020-08-16 — End: 2020-08-16

## 2020-08-16 MED FILL — LANTUS SOLOSTAR 100 UNITS/M: 100 | 26 days supply | Qty: 9 | Fill #0

## 2020-08-16 MED FILL — DIVALPROEX SOD ER 500 MG TA: 500 | 30 days supply | Qty: 90 | Fill #0

## 2020-08-16 NOTE — Telephone Encounter (Signed)
Copied from CRM [id number]. Topic: Quick Communication - Rx Refill/Question >> Aug 16, 2020 10:10 AM Gaetana Michaelis A wrote: Medication: divalproex (DEPAKOTE ER) 500 MG LANTUS SOLOSTAR 100 UNIT/ML Solostar Pen   Has the patient contacted their pharmacy? Yes patient has contacted pharmacy   Preferred Pharmacy (with phone number or street name): [facility] [place], [place] - [place] [address]  Phone:  [phone]  Agent: Please be advised that RX refills may take up to 3 business days. We ask that you follow-up with your pharmacy.

## 2020-08-29 ENCOUNTER — Other Ambulatory Visit: Payer: Self-pay | Admitting: Neurology

## 2020-08-29 ENCOUNTER — Other Ambulatory Visit: Payer: Self-pay

## 2020-08-29 ENCOUNTER — Ambulatory Visit: Payer: BC Managed Care – PPO | Admitting: Neurology

## 2020-08-29 ENCOUNTER — Encounter: Payer: Self-pay | Admitting: Neurology

## 2020-08-29 DIAGNOSIS — G40309 Generalized idiopathic epilepsy and epileptic syndromes, not intractable, without status epilepticus: Secondary | ICD-10-CM

## 2020-08-29 MED ORDER — DIVALPROEX SODIUM ER 500 MG PO TB24
ORAL_TABLET | ORAL | 11 refills | Status: DC
Start: 2020-08-29 — End: 2020-08-29

## 2020-08-29 NOTE — Progress Notes (Signed)
NEUROLOGY FOLLOW UP OFFICE NOTE  [name] [id number] March 13, 1976  HISTORY OF PRESENT ILLNESS: I had the pleasure of seeing [name] in follow-up in the neurology clinic on 08/29/2020.  The patient was last seen in June 2020 for seizures. He was in the ER for a seizure in August 2020 in the setting of hypoglycemia with glucose of 33. He denies any further seizures since then. Glucose levels are still pretty labile, he states his HbA1c is probably very high now, he will be seeing his endocrinologist next week. He denies any staring/unresponsive episodes, gaps in time, olfactory hallucinations, focal numbness/tingling/weakness, myoclonic jerks. He is on Depakote ER 1500mg  qhs with no side effects. He had tooth issues in October and still tastes blood sometimes. He denies any headaches, dizziness, vision changes, no falls. Sleep is okay, mood is good. He works doing [place] for the city of [place]. He lives with his mother and brother.    History on Initial Assessment 06/23/2018: This is a pleasant 45 year old left-handed man with a history of type I diabetes, hypertension, presenting to establish care for seizures. He reports that seizures started around age 56. All his seizures appear nocturnal, usually occurring early in the morning where he wakes up with a tongue bite and incontinence, or his mother wakes him up. He recalls the worst seizure at age 65 when he bit his tongue so bad and woke up in the ER with right shoulder ?subluxation/dislocation. He states he was told it was not fractured. He has been taking Depakote ER 1500mg  qhs for several years without side effects and does not recall trying other AEDs. His longest seizure-free interval has been 3 years. Last seizure was 04/07/18, he woke up with bedsheets wet on the floor, lightheaded. Previous to this, he recalls a seizure 2 months prior. He lives alone and has not noticed any recent episodes of gaps in time, but in the past would  be "out of space" when his sugars would go to 30-40s. Seizure triggers include exhaustion, sleep deprivation, heat, and low blood sugar (30-40). He reports glucose levels have been okay recently. He noticed some hand jerking when younger, none recently. He tastes blood after seizures. He has not been told of any staring/unresponsive episodes. He does not usually have headaches, but recently has been having headaches once a week with diffuse thumping where he has to focus his eyes. No nausea/vomiting, visual obscurations. He denies any dizziness, diplopia, dysarthria/dysphagia, neck/back pain, bowel/bladder dysfunction. He reinjured his right shoulder a few months ago and now has a sharp sensation and tingling when he pulls his right shoulder back or lifts his arms. He started to notice memory changes a few months ago, he has to write things down or put them on a calendar. MMSE 24/30 at PCP office in October 2019. He states his stress level has been up since April, worse with the shoulder injury. He denies missing medications or bills. He is planning to go back to school. He has some sleep issues which Trazodone helps with.   Epilepsy Risk Factors:  He is unsure if his mother and a paternal cousin have seizures (he has not personally witnessed any). He had a head injury at age 22 where he lost consciousness and has a scar on the right brow. Otherwise he had a normal birth and early development.  There is no history of febrile convulsions, CNS infections such as meningitis/encephalitis, neurosurgical procedures.  Lab Results  Component Value Date  VALPROATE 63 04/02/2019     PAST MEDICAL HISTORY: Past Medical History:  Diagnosis Date  . Diabetes mellitus   . Hypertension   . Seizures (HCC)     MEDICATIONS: Current Outpatient Medications on File Prior to Visit  Medication Sig Dispense Refill  . acetaminophen (TYLENOL) 325 MG tablet Take 2 tablets (650 mg total) by mouth every 6 (six) hours as  needed. 30 tablet 0  . aspirin 81 MG tablet Take 81 mg by mouth at bedtime.     . divalproex (DEPAKOTE ER) 500 MG 24 hr tablet TAKE 3 TABLETS BY MOUTH EVERY NIGHT 90 tablet 3  . glucose blood test strip Use as instructed. MUST MAKE APPT FOR FURTHER REFILLS 100 each 0  . ibuprofen (ADVIL) 800 MG tablet Take 1 tablet (800 mg total) by mouth 3 (three) times daily. 21 tablet 0  . insulin aspart (NOVOLOG FLEXPEN) 100 UNIT/ML FlexPen Monitor blood glucose TID. For blood sugars 0-150 give 0 units of insulin, 151-200 give 2 units of insulin, 201-250 give 4 units, 251-300 give 6 units, 301-350 give 8 units, 351-400 give 10 units,> 400 give 12 units and call M.D. Discussed hypoglycemia protocol. 15 mL 2  . insulin glargine (LANTUS SOLOSTAR) 100 UNIT/ML Solostar Pen INJECT 17 UNITS INTO THE SKIN TWICE DAILY (Patient taking differently: INJECT 18 UNITS INTO THE SKIN TWICE DAILY) 15 mL 1  . Insulin Pen Needle (B-D UF III MINI PEN NEEDLES) 31G X 5 MM MISC Insert into the skin as prescribed 90 each 1  . lisinopril (ZESTRIL) 20 MG tablet Take 1 tablet (20 mg total) by mouth at bedtime. 90 tablet 1  . Omega-3 Fatty Acids (FISH OIL PO) Take 1 capsule by mouth daily.    . simvastatin (ZOCOR) 20 MG tablet Take 1 tablet (20 mg total) by mouth at bedtime. 90 tablet 1  . TRUEplus Lancets 28G MISC 1 each by Does not apply route 3 (three) times daily. 100 each 11   No current facility-administered medications on file prior to visit.    ALLERGIES: Allergies  Allergen Reactions  . Geodon [Ziprasidone Hcl] Other (See Comments)    Severe muscle spasms (ENTIRE BODY)- had to call EMS  . Ziprasidone Mesylate Other (See Comments)    "severe muscle spasms"- Had to call EMS    FAMILY HISTORY: Family History  Problem Relation Age of Onset  . Hypertension Mother   . Diabetes Paternal Uncle     SOCIAL HISTORY: Social History   Socioeconomic History  . Marital status: Single    Spouse name: Not on file  . Number of  children: 3  . Years of education: Not on file  . Highest education level: Some college, no degree  Occupational History  . Not on file  Tobacco Use  . Smoking status: Never Smoker  . Smokeless tobacco: Never Used  Vaping Use  . Vaping Use: Never used  Substance and Sexual Activity  . Alcohol use: Not Currently    Alcohol/week: 1.0 standard drink    Types: 1 Glasses of wine per week    Comment: rarely  . Drug use: No  . Sexual activity: Not Currently  Other Topics Concern  . Not on file  Social History Narrative   Pt is left handed   Lives alone in 2 story home   Has 3 children   Some college education   Worked as Herbalist   Social Determinants of Health   Financial Resource Strain: Not on file  Food Insecurity: Not on file  Transportation Needs: Not on file  Physical Activity: Not on file  Stress: Not on file  Social Connections: Not on file  Intimate Partner Violence: Not on file     PHYSICAL EXAM: Vitals:   08/29/20 1444  BP: (!) 172/92  Pulse: 71  SpO2: 99%   General: No acute distress Head:  Normocephalic/atraumatic Skin/Extremities: No rash, no edema Neurological Exam: alert and awake. No aphasia or dysarthria. Fund of knowledge is appropriate.  Recent and remote memory are intact.  Attention and concentration are normal.   Cranial nerves: Pupils equal, round. Extraocular movements intact with no nystagmus. Visual fields full.  No facial asymmetry.  Motor: Bulk and tone normal, muscle strength 5/5 throughout with no pronator drift.   Finger to nose testing intact.  Gait narrow-based and steady, able to tandem walk adequately.    IMPRESSION: This is a pleasant 45 yo LH man with a history of type I diabetes, hypertension, and seizures suggestive of primary generalized epilepsy. His last seizure was in 02/2019 in the setting of hypoglycemia. Prior to this, he had a seizure in 08/2018 after missing medication. He denies any seizures since 02/2019 on Depakote  ER 500mg  3 tabs qhs (1500mg  qhs), refills sent. He is aware of [place] driving laws to stop driving after a seizure until 6 months seizure-free. Follow-up in 1 year, he knows to call for any changes.   Thank you for allowing me to participate in his care.  Please do not hesitate to call for any questions or concerns.   , M.D.   CC: , NP

## 2020-08-29 NOTE — Patient Instructions (Signed)
Good to see you! Continue Depakote ER 500mg : Take 3 tablets every night. Continue control of sugar levels. Follow-up in 1 year, call for any changes  Seizure Precautions: 1. If medication has been prescribed for you to prevent seizures, take it exactly as directed.  Do not stop taking the medicine without talking to your doctor first, even if you have not had a seizure in a long time.   2. Avoid activities in which a seizure would cause danger to yourself or to others.  Don't operate dangerous machinery, swim alone, or climb in high or dangerous places, such as on ladders, roofs, or girders.  Do not drive unless your doctor says you may.  3. If you have any warning that you may have a seizure, lay down in a safe place where you can't hurt yourself.    4.  No driving for 6 months from last seizure, as per [facility]   Please refer to the following link on the Epilepsy Foundation of America's website for more information: http://www.epilepsyfoundation.org/answerplace/Social/driving/drivingu.cfm   5.  Maintain good sleep hygiene. Avoid alcohol.  6.  Contact your doctor if you have any problems that may be related to the medicine you are taking.  7.  Call 911 and bring the patient back to the ED if:        A.  The seizure lasts longer than 5 minutes.       B.  The patient doesn't awaken shortly after the seizure  C.  The patient has new problems such as difficulty seeing, speaking or moving  D.  The patient was injured during the seizure  E.  The patient has a temperature over 102 F (39C)  F.  The patient vomited and now is having trouble breathing

## 2020-09-05 DIAGNOSIS — E1029 Type 1 diabetes mellitus with other diabetic kidney complication: Secondary | ICD-10-CM | POA: Diagnosis not present

## 2020-09-05 DIAGNOSIS — E1065 Type 1 diabetes mellitus with hyperglycemia: Secondary | ICD-10-CM | POA: Diagnosis not present

## 2020-09-05 DIAGNOSIS — E1040 Type 1 diabetes mellitus with diabetic neuropathy, unspecified: Secondary | ICD-10-CM | POA: Diagnosis not present

## 2020-09-05 DIAGNOSIS — R809 Proteinuria, unspecified: Secondary | ICD-10-CM | POA: Diagnosis not present

## 2020-09-08 MED FILL — LISINOPRIL 20 MG TABLET: 20 | 30 days supply | Qty: 30 | Fill #1

## 2020-09-08 MED FILL — DIVALPROEX SOD ER 500 MG TA: 500 | 30 days supply | Qty: 90 | Fill #1

## 2020-09-27 ENCOUNTER — Ambulatory Visit (HOSPITAL_COMMUNITY)
Admission: EM | Admit: 2020-09-27 | Discharge: 2020-09-27 | Disposition: A | Discharge status: Home / Self Care | Payer: BC Managed Care – PPO | Attending: Family Medicine | Admitting: Family Medicine

## 2020-09-27 ENCOUNTER — Other Ambulatory Visit: Payer: Self-pay

## 2020-09-27 ENCOUNTER — Encounter (HOSPITAL_COMMUNITY): Payer: Self-pay

## 2020-09-27 DIAGNOSIS — K0889 Other specified disorders of teeth and supporting structures: Secondary | ICD-10-CM

## 2020-09-27 DIAGNOSIS — R030 Elevated blood-pressure reading, without diagnosis of hypertension: Secondary | ICD-10-CM

## 2020-09-27 MED ORDER — HYDROCODONE-ACETAMINOPHEN 5-325 MG PO TABS: 1.0000 | ORAL_TABLET | Freq: Four times a day (QID) | ORAL | 0 refills | Status: DC | PRN

## 2020-09-27 MED ORDER — CLINDAMYCIN HCL 300 MG PO CAPS: 300.0000 mg | ORAL_CAPSULE | Freq: Three times a day (TID) | ORAL | 0 refills | Status: DC

## 2020-09-27 NOTE — Discharge Instructions (Signed)

## 2020-09-27 NOTE — ED Triage Notes (Signed)
Pt presents with bottom portion of gums of both side having swelling and tenderness X 3 weeks.

## 2020-09-27 NOTE — ED Triage Notes (Signed)
EKG listed in patients chart for 09/27/2020 was listed in error. EKG was not performed on patient, but was performed on different patient with [name] armband on. Wrong armband was given at registration.

## 2020-09-28 ENCOUNTER — Other Ambulatory Visit: Payer: Self-pay

## 2020-09-28 ENCOUNTER — Ambulatory Visit: Payer: BC Managed Care – PPO | Attending: Nurse Practitioner | Admitting: Nurse Practitioner

## 2020-09-28 ENCOUNTER — Encounter: Payer: Self-pay | Admitting: Nurse Practitioner

## 2020-09-28 ENCOUNTER — Other Ambulatory Visit: Payer: Self-pay | Admitting: Nurse Practitioner

## 2020-09-28 VITALS — BP 171/93 | HR 76 | Resp 16 | Wt 201.0 lb

## 2020-09-28 DIAGNOSIS — R809 Proteinuria, unspecified: Secondary | ICD-10-CM

## 2020-09-28 DIAGNOSIS — D649 Anemia, unspecified: Secondary | ICD-10-CM

## 2020-09-28 DIAGNOSIS — I10 Essential (primary) hypertension: Secondary | ICD-10-CM | POA: Diagnosis not present

## 2020-09-28 DIAGNOSIS — E108 Type 1 diabetes mellitus with unspecified complications: Secondary | ICD-10-CM

## 2020-09-28 DIAGNOSIS — E1029 Type 1 diabetes mellitus with other diabetic kidney complication: Secondary | ICD-10-CM

## 2020-09-28 DIAGNOSIS — E785 Hyperlipidemia, unspecified: Secondary | ICD-10-CM

## 2020-09-28 LAB — POCT GLYCOSYLATED HEMOGLOBIN (HGB A1C): HbA1c, POC (controlled diabetic range): 8.8 % — AB (ref 0.0–7.0)

## 2020-09-28 LAB — GLUCOSE, POCT (MANUAL RESULT ENTRY): POC Glucose: 76 mg/dl (ref 70–99)

## 2020-09-28 MED ORDER — ACETAMINOPHEN-CODEINE #3 300-30 MG PO TABS: 1.0000 | ORAL_TABLET | Freq: Three times a day (TID) | ORAL | 0 refills | Status: DC | PRN

## 2020-09-28 MED ORDER — LISINOPRIL 40 MG PO TABS: 40.0000 mg | ORAL_TABLET | Freq: Every day | ORAL | 1 refills | Status: DC

## 2020-09-28 MED FILL — ACETAMINOPHEN-COD #3 TABLET: 300-30 | 4 days supply | Qty: 21 | Fill #0

## 2020-09-28 NOTE — Progress Notes (Signed)
Assessment & Plan:  [name] was seen today for diabetes and hypertension.  Diagnoses and all orders for this visit:  DM (diabetes mellitus), type 1 with complications ([place]) -     POCT glucose (manual entry) -     POCT glycosylated hemoglobin (Hb A1C) -     CMP14+EGFR -     Ambulatory referral to Ophthalmology Continue blood sugar control as discussed in office today, low carbohydrate diet, and regular physical exercise as tolerated, 150 minutes per week (30 min each day, 5 days per week, or 50 min 3 days per week). Keep blood sugar logs with fasting goal of 90-130 mg/dl, post prandial (after you eat) less than 180.  For Hypoglycemia: BS <60 and Hyperglycemia BS >400; contact the clinic ASAP. Annual eye exams and foot exams are recommended.   Essential hypertension -     lisinopril (ZESTRIL) 40 MG tablet; Take 1 tablet (40 mg total) by mouth at bedtime. -     CMP14+EGFR Continue all antihypertensives as prescribed.  Remember to bring in your blood pressure log with you for your follow up appointment.  DASH/Mediterranean Diets are healthier choices for HTN.    Microalbuminuria due to type 1 diabetes mellitus (HCC) -     lisinopril (ZESTRIL) 40 MG tablet; Take 1 tablet (40 mg total) by mouth at bedtime.  Dyslipidemia, goal LDL below 70 -     Lipid panel INSTRUCTIONS: Work on a low fat, heart healthy diet and participate in regular aerobic exercise program by working out at least 150 minutes per week; 5 days a week-30 minutes per day. Avoid red meat/beef/steak,  fried foods. junk foods, sodas, sugary drinks, unhealthy snacking, alcohol and smoking.  Drink at least 80 oz of water per day and monitor your carbohydrate intake daily.    Anemia, unspecified type -     CBC    Patient has been counseled on age-appropriate routine health concerns for screening and prevention. These are reviewed and up-to-date. Referrals have been placed accordingly. Immunizations are up-to-date or declined.     Subjective:   Chief Complaint  Patient presents with  . Diabetes  . Hypertension   HPI [name] N Grilliot 45 y.o. male presents to office today for follow up  He has a past medical history of DM1, Hypertension, and Seizures.  He is currently seeing [facility] for his DM1. Currently A1c has improved. He is administering lantus 18 units BID and Novolog SSI.   He sees Dr. Delice Lesch with Neurology for a history of seizures. Taking Depakote ER 500 mg daily.   He is currently taking cleocin for dental infection. States he may need deep root cleaning as well.    Essential Hypertension Poorly controlled. Will increase lisinopril 20 mg to 40 mg. If continues elevated will need to add amlodipine. Denies chest pain, shortness of breath, palpitations, lightheadedness, dizziness, headaches or BLE edema.  BP Readings from Last 3 Encounters:  09/28/20 (!) 171/93  09/27/20 (!) 191/89  08/29/20 (!) 172/92   Statin is on hold for now. He would like to see if his levels have improved with diet change. We did discuss his risk score today. Will likely need to restart statin.  The 10-year ASCVD risk score [name] DC Brooke Bonito., et al., 2013) is: 17.1%   Values used to calculate the score:     Age: 45 years     Sex: Male     Is Non-Hispanic African American: Yes     Diabetic: Yes  Tobacco smoker: No     Systolic Blood Pressure: 881 mmHg     Is BP treated: Yes     HDL Cholesterol: 49 mg/dL     Total Cholesterol: 164 mg/dL     Review of Systems  Constitutional: Negative for fever, malaise/fatigue and weight loss.  HENT: Negative.  Negative for nosebleeds.   Eyes: Negative.  Negative for blurred vision, double vision and photophobia.  Respiratory: Negative.  Negative for cough and shortness of breath.   Cardiovascular: Negative.  Negative for chest pain, palpitations and leg swelling.  Gastrointestinal: Negative.  Negative for heartburn, nausea and vomiting.  Musculoskeletal: Negative.  Negative  for myalgias.  Neurological: Negative.  Negative for dizziness, focal weakness, seizures and headaches.  Psychiatric/Behavioral: Negative.  Negative for suicidal ideas.    Past Medical History:  Diagnosis Date  . Diabetes mellitus   . Hypertension   . Seizures ([place])     Past Surgical History:  Procedure Laterality Date  . HERNIA REPAIR      Family History  Problem Relation Age of Onset  . Hypertension Mother   . Diabetes Paternal Uncle     Social History Reviewed with no changes to be made today.   Outpatient Medications Prior to Visit  Medication Sig Dispense Refill  . clindamycin (CLEOCIN) 300 MG capsule Take 1 capsule (300 mg total) by mouth 3 (three) times daily. 30 capsule 0  . divalproex (DEPAKOTE ER) 500 MG 24 hr tablet TAKE 3 TABLETS BY MOUTH EVERY NIGHT 90 tablet 11  . ibuprofen (ADVIL) 800 MG tablet Take 1 tablet (800 mg total) by mouth 3 (three) times daily. 21 tablet 0  . insulin aspart (NOVOLOG FLEXPEN) 100 UNIT/ML FlexPen Monitor blood glucose TID. For blood sugars 0-150 give 0 units of insulin, 151-200 give 2 units of insulin, 201-250 give 4 units, 251-300 give 6 units, 301-350 give 8 units, 351-400 give 10 units,> 400 give 12 units and call M.D. Discussed hypoglycemia protocol. 15 mL 2  . insulin glargine (LANTUS SOLOSTAR) 100 UNIT/ML Solostar Pen INJECT 17 UNITS INTO THE SKIN TWICE DAILY (Patient taking differently: INJECT 18 UNITS INTO THE SKIN TWICE DAILY) 15 mL 1  . Insulin Pen Needle (B-D UF III MINI PEN NEEDLES) 31G X 5 MM MISC Insert into the skin as prescribed 90 each 1  . aspirin 81 MG tablet Take 81 mg by mouth at bedtime.  (Patient not taking: Reported on 09/28/2020)    . glucose blood test strip Use as instructed. MUST MAKE APPT FOR FURTHER REFILLS (Patient not taking: Reported on 09/28/2020) 100 each 0  . HYDROcodone-acetaminophen (NORCO/VICODIN) 5-325 MG tablet Take 1 tablet by mouth every 6 (six) hours as needed for moderate pain or severe pain.  (Patient not taking: Reported on 09/28/2020) 8 tablet 0  . Omega-3 Fatty Acids (FISH OIL PO) Take 1 capsule by mouth daily. (Patient not taking: Reported on 09/28/2020)    . simvastatin (ZOCOR) 20 MG tablet Take 1 tablet (20 mg total) by mouth at bedtime. 90 tablet 1  . TRUEplus Lancets 28G MISC 1 each by Does not apply route 3 (three) times daily. (Patient not taking: Reported on 09/28/2020) 100 each 11  . lisinopril (ZESTRIL) 20 MG tablet Take 1 tablet (20 mg total) by mouth at bedtime. 90 tablet 1   No facility-administered medications prior to visit.    Allergies  Allergen Reactions  . Geodon [Ziprasidone Hcl] Other (See Comments)    Severe muscle spasms (ENTIRE BODY)- had to call  EMS  . Ziprasidone Mesylate Other (See Comments)    "severe muscle spasms"- Had to call EMS       Objective:    BP (!) 171/93   Pulse 76   Resp 16   Wt 201 lb (91.2 kg)   SpO2 98%   BMI 28.03 kg/m  Wt Readings from Last 3 Encounters:  09/28/20 201 lb (91.2 kg)  08/29/20 188 lb 12.8 oz (85.6 kg)  02/26/20 180 lb (81.6 kg)    Physical Exam Vitals and nursing note reviewed.  Constitutional:      Appearance: He is well-developed.  HENT:     Head: Normocephalic and atraumatic.  Cardiovascular:     Rate and Rhythm: Normal rate and regular rhythm.     Heart sounds: Normal heart sounds. No murmur heard. No friction rub. No gallop.   Pulmonary:     Effort: Pulmonary effort is normal. No tachypnea or respiratory distress.     Breath sounds: Normal breath sounds. No decreased breath sounds, wheezing, rhonchi or rales.  Chest:     Chest wall: No tenderness.  Abdominal:     General: Bowel sounds are normal.     Palpations: Abdomen is soft.  Musculoskeletal:        General: Normal range of motion.     Cervical back: Normal range of motion.  Skin:    General: Skin is warm and dry.  Neurological:     Mental Status: He is alert and oriented to person, place, and time.     Coordination: Coordination  normal.  Psychiatric:        Behavior: Behavior normal. Behavior is cooperative.        Thought Content: Thought content normal.        Judgment: Judgment normal.          Patient has been counseled extensively about nutrition and exercise as well as the importance of adherence with medications and regular follow-up. The patient was given clear instructions to go to ER or return to medical center if symptoms don't improve, worsen or new problems develop. The patient verbalized understanding.   Follow-up: Return for 2 weeks luke BP check. See me in 3 months.   Gildardo Pounds, FNP-BC [facility] [place], [place]   09/28/2020, 3:56 PM

## 2020-09-28 NOTE — ED Provider Notes (Signed)
[facility]ENTER   [id number] 09/27/20 Arrival Time: 1812  ASSESSMENT & PLAN:  1. Pain, dental   2. Elevated blood pressure reading without diagnosis of hypertension    No sign of abscess requiring I&D at this time. Discussed.  Meds ordered this encounter  Medications  . HYDROcodone-acetaminophen (NORCO/VICODIN) 5-325 MG tablet    Sig: Take 1 tablet by mouth every 6 (six) hours as needed for moderate pain or severe pain.    Dispense:  8 tablet    Refill:  0  . clindamycin (CLEOCIN) 300 MG capsule    Sig: Take 1 capsule (300 mg total) by mouth 3 (three) times daily.    Dispense:  30 capsule    Refill:  0    [place] Controlled Substances Registry consulted for this patient. I feel the risk/benefit ratio today is favorable for proceeding with this prescription for a controlled substance. Medication sedation precautions given.  Dental resource written instructions given. He will schedule dental evaluation as soon as possible if not improving over the next 24-48 hours.  Reviewed expectations re: course of current medical issues. Questions answered. Outlined signs and symptoms indicating need for more acute intervention. Patient verbalized understanding. After Visit Summary given.   SUBJECTIVE:  [name] is a 45 y.o. male who reports gradual onset of right lower rear dental pain described as aching. Present for past 2 weeks at least. Fever: absent. Tolerating PO intake but reports pain with chewing. Normal swallowing. He does not see a dentist regularly. No neck swelling or pain. OTC analgesics without relief.  Increased blood pressure noted today. Reports that he has not been treated for hypertension in the past. No symptoms.  OBJECTIVE: Vitals:   09/27/20 1856  BP: (!) 191/89  Pulse: 71  Resp: 18  Temp: 98.2 F (36.8 C)  TempSrc: Oral  SpO2: 100%    General appearance: alert; no distress HENT: normocephalic; atraumatic; dentition: fair; right lower rear gums  without areas of fluctuance, drainage, or bleeding and with tenderness to palpation; normal jaw movement without difficulty Neck: supple without LAD; FROM; trachea midline Lungs: normal respirations; unlabored; speaks full sentences without difficulty Skin: warm and dry Psychological: alert and cooperative; normal mood and affect  Allergies  Allergen Reactions  . Geodon [Ziprasidone Hcl] Other (See Comments)    Severe muscle spasms (ENTIRE BODY)- had to call EMS  . Ziprasidone Mesylate Other (See Comments)    "severe muscle spasms"- Had to call EMS    Past Medical History:  Diagnosis Date  . Diabetes mellitus   . Hypertension   . Seizures (HCC)    Social History   Socioeconomic History  . Marital status: Single    Spouse name: Not on file  . Number of children: 3  . Years of education: Not on file  . Highest education level: Some college, no degree  Occupational History  . Not on file  Tobacco Use  . Smoking status: Never Smoker  . Smokeless tobacco: Never Used  Vaping Use  . Vaping Use: Never used  Substance and Sexual Activity  . Alcohol use: Not Currently    Alcohol/week: 1.0 standard drink    Types: 1 Glasses of wine per week    Comment: rarely  . Drug use: No  . Sexual activity: Not Currently  Other Topics Concern  . Not on file  Social History Narrative   Pt is left handed   Lives alone in 2 story home   Has 3 children  Some college education   Worked as Herbalist   Social Determinants of Corporate investment banker Strain: Not on file  Food Insecurity: Not on file  Transportation Needs: Not on file  Physical Activity: Not on file  Stress: Not on file  Social Connections: Not on file  Intimate Partner Violence: Not on file   Family History  Problem Relation Age of Onset  . Hypertension Mother   . Diabetes Paternal Uncle    Past Surgical History:  Procedure Laterality Date  . HERNIA REPAIR       Mardella Layman, MD 09/28/20 [phone]

## 2020-09-29 LAB — CMP14+EGFR
ALT: 18 IU/L (ref 0–44)
AST: 28 IU/L (ref 0–40)
Albumin/Globulin Ratio: 1.7 (ref 1.2–2.2)
Albumin: 3.6 g/dL — ABNORMAL LOW (ref 4.0–5.0)
Alkaline Phosphatase: 47 IU/L (ref 44–121)
BUN/Creatinine Ratio: 12 (ref 9–20)
BUN: 18 mg/dL (ref 6–24)
Bilirubin Total: 0.3 mg/dL (ref 0.0–1.2)
CO2: 24 mmol/L (ref 20–29)
Calcium: 8.6 mg/dL — ABNORMAL LOW (ref 8.7–10.2)
Chloride: 104 mmol/L (ref 96–106)
Creatinine, Ser: 1.47 mg/dL — ABNORMAL HIGH (ref 0.76–1.27)
Globulin, Total: 2.1 g/dL (ref 1.5–4.5)
Glucose: 109 mg/dL — ABNORMAL HIGH (ref 65–99)
Potassium: 4.5 mmol/L (ref 3.5–5.2)
Sodium: 141 mmol/L (ref 134–144)
Total Protein: 5.7 g/dL — ABNORMAL LOW (ref 6.0–8.5)
eGFR: 61 mL/min/{1.73_m2} (ref >59)

## 2020-09-29 LAB — LIPID PANEL
Chol/HDL Ratio: 3.2 ratio (ref 0.0–5.0)
Cholesterol, Total: 222 mg/dL — ABNORMAL HIGH (ref 100–199)
HDL: 70 mg/dL (ref >39)
LDL Chol Calc (NIH): 143 mg/dL — ABNORMAL HIGH (ref 0–99)
Triglycerides: 51 mg/dL (ref 0–149)
VLDL Cholesterol Cal: 9 mg/dL (ref 5–40)

## 2020-09-29 LAB — CBC
Hematocrit: 38.4 % (ref 37.5–51.0)
Hemoglobin: 12.8 g/dL — ABNORMAL LOW (ref 13.0–17.7)
MCH: 29.5 pg (ref 26.6–33.0)
MCHC: 33.3 g/dL (ref 31.5–35.7)
MCV: 89 fL (ref 79–97)
Platelets: 174 10*3/uL (ref 150–450)
RBC: 4.34 x10E6/uL (ref 4.14–5.80)
RDW: 12.9 % (ref 11.6–15.4)
WBC: 5.5 10*3/uL (ref 3.4–10.8)

## 2020-10-04 DIAGNOSIS — E1065 Type 1 diabetes mellitus with hyperglycemia: Secondary | ICD-10-CM | POA: Diagnosis not present

## 2020-10-05 MED FILL — LANTUS SOLOSTAR 100 UNITS/M: 100 | 26 days supply | Qty: 9 | Fill #1

## 2020-10-05 MED FILL — NOVOLOG FLEXPEN SYRINGE: 100 | 30 days supply | Qty: 15 | Fill #1

## 2020-10-05 MED FILL — LISINOPRIL 40 MG TAB: 40 | 90 days supply | Qty: 90 | Fill #0

## 2020-10-08 ENCOUNTER — Other Ambulatory Visit: Payer: Self-pay

## 2020-10-13 ENCOUNTER — Encounter: Payer: Self-pay | Admitting: Pharmacist

## 2020-10-13 ENCOUNTER — Ambulatory Visit: Payer: BC Managed Care – PPO | Attending: Nurse Practitioner | Admitting: Pharmacist

## 2020-10-13 ENCOUNTER — Other Ambulatory Visit: Payer: Self-pay

## 2020-10-13 VITALS — BP 131/84

## 2020-10-13 DIAGNOSIS — I10 Essential (primary) hypertension: Secondary | ICD-10-CM

## 2020-10-13 NOTE — Progress Notes (Signed)
   S:    PCP: Zelda  Patient arrives in good spirits. Presents to the clinic for hypertension evaluation, counseling, and management. Patient was referred and last seen by Primary Care Provider on 09/28/2020. BP at that visit was 171/93 - Ms. Shon Hale increased lisinopril to 40 mg daily.    Medication adherence reported.  Current BP Medications include:  Lisinopril 40 mg daily   Dietary habits include: Mediterranean diet/DASH diet compliant  Exercise habits include: some through work; occasionally will work-out at [employer] / Social history:  - FHx: HTN, DM - Tobacco: never smoker - Alcohol: denies alcohol use   O:  Vitals:   10/13/20 0858  BP: 131/84   Home BP readings: none  Last 3 Office BP readings: BP Readings from Last 3 Encounters:  10/13/20 131/84  09/28/20 (!) 171/93  09/27/20 (!) 191/89    BMET    Component Value Date/Time   NA 141 09/28/2020 1606   K 4.5 09/28/2020 1606   CL 104 09/28/2020 1606   CO2 24 09/28/2020 1606   GLUCOSE 109 (H) 09/28/2020 1606   GLUCOSE 182 (H) 04/03/2019 0703   BUN 18 09/28/2020 1606   CREATININE 1.47 (H) 09/28/2020 1606   CREATININE 1.32 12/16/2015 1521   CALCIUM 8.6 (L) 09/28/2020 1606   GFRNONAA 62 02/26/2020 1647   GFRNONAA 68 12/16/2015 1521   GFRAA 71 02/26/2020 1647   GFRAA 79 12/16/2015 1521    Renal function: CrCl cannot be calculated (Unknown ideal weight.).  Clinical ASCVD: No  The 10-year ASCVD risk score Denman George DC Jr., et al., 2013) is: 10.5%   Values used to calculate the score:     Age: 22 years     Sex: Male     Is Non-Hispanic African American: Yes     Diabetic: Yes     Tobacco smoker: No     Systolic Blood Pressure: 131 mmHg     Is BP treated: Yes     HDL Cholesterol: 70 mg/dL     Total Cholesterol: 222 mg/dL  A/P: Hypertension longstanding currently close to goal on current medications. BP Goal = < 130/80 mmHg. Medication adherence reported.  -Continued lisinopril 40 mg  daily. -Counseled on lifestyle modifications for blood pressure control including reduced dietary sodium, increased exercise, adequate sleep.  Results reviewed and written information provided.   Total time in face-to-face counseling 15 minutes.   F/U Clinic Visit in 2-3 weeks.    Butch Penny, PharmD, Patsy Baltimore, CPP Clinical Pharmacist [facility] [phone]

## 2020-10-14 ENCOUNTER — Encounter: Payer: Self-pay | Admitting: Pharmacist

## 2020-10-26 ENCOUNTER — Other Ambulatory Visit: Payer: Self-pay | Admitting: Nurse Practitioner

## 2020-10-26 ENCOUNTER — Other Ambulatory Visit: Payer: Self-pay

## 2020-10-26 DIAGNOSIS — G40309 Generalized idiopathic epilepsy and epileptic syndromes, not intractable, without status epilepticus: Secondary | ICD-10-CM

## 2020-10-26 MED ORDER — DIVALPROEX SODIUM ER 500 MG PO TB24
1500.0000 mg | ORAL_TABLET | Freq: Every day | ORAL | 2 refills | Status: DC
  Filled 2020-10-26 – 2020-11-03 (×2): qty 90, 30d supply, fill #0
  Filled 2020-12-20: qty 90, 30d supply, fill #1
  Filled 2021-02-02: qty 90, 30d supply, fill #2

## 2020-10-26 NOTE — Telephone Encounter (Signed)
   Notes to clinic:  medication requested was filled by a different provider  Review for continued use and refill   Requested Prescriptions  Pending Prescriptions Disp Refills   divalproex (DEPAKOTE ER) 500 MG 24 hr tablet 90 tablet 11    Sig: TAKE 3 TABLETS BY MOUTH EVERY NIGHT      Not Delegated - Neurology:  Anticonvulsants - Valproates Failed - 10/26/2020  9:57 AM      Failed - This refill cannot be delegated      Failed - HGB in normal range and within 360 days    Hemoglobin  Date Value Ref Range Status  09/28/2020 12.8 (L) 13.0 - 17.7 g/dL Final          Failed - Valproic Acid (serum) in normal range and within 360 days    Valproic Acid Lvl  Date Value Ref Range Status  04/02/2019 63 50.0 - 100.0 ug/mL Final    Comment:    Performed at [facility], [address] [place], [place] [address]          Passed - AST in normal range and within 360 days    AST  Date Value Ref Range Status  09/28/2020 28 0 - 40 IU/L Final          Passed - ALT in normal range and within 360 days    ALT  Date Value Ref Range Status  09/28/2020 18 0 - 44 IU/L Final          Passed - PLT in normal range and within 360 days    Platelets  Date Value Ref Range Status  09/28/2020 174 150 - 450 x10E3/uL Final          Passed - WBC in normal range and within 360 days    WBC  Date Value Ref Range Status  09/28/2020 5.5 3.4 - 10.8 x10E3/uL Final  04/03/2019 5.9 4.0 - 10.5 K/uL Final          Passed - HCT in normal range and within 360 days    Hematocrit  Date Value Ref Range Status  09/28/2020 38.4 37.5 - 51.0 % Final          Passed - Valid encounter within last 12 months    Recent Outpatient Visits           1 week ago Essential hypertension   [place] [facility] [place], Stephen L, RPH-CPP   4 weeks ago DM (diabetes mellitus), type 1 with complications [facility])   [place] [place], [place] W, NP   8 months ago  DM (diabetes mellitus), type 1 with complications [facility])   [place] [facility] [place], [place] W, NP   1 year ago Poorly controlled type 1 diabetes mellitus with complication [facility])   [facility] [place], Shea Stakes, NP   1 year ago DM (diabetes mellitus), type 1 with complications [facility])   [place] [facility] [place], Marzella Schlein, [place]       Future Appointments             Tomorrow Lois Huxley, Cornelius Moras, RPH-CPP [facility]   In 2 months Claiborne Rigg, NP [facility]

## 2020-10-26 NOTE — Telephone Encounter (Signed)
Copied from CRM [id number]. Topic: Quick Communication - Rx Refill/Question >> Oct 26, 2020  9:54 AM Gaetana Michaelis A wrote: Medication: divalproex (DEPAKOTE ER) 500 MG 24 hr tablet   Has the patient contacted their pharmacy? No. Patient has attempted to contact their pharmacy multiple times and been unable to reach them by phone  Preferred Pharmacy (with phone number or street name): [facility]  Phone:  [phone] Fax:  [phone]  Agent: Please be advised that RX refills may take up to 3 business days. We ask that you follow-up with your pharmacy.

## 2020-10-27 ENCOUNTER — Ambulatory Visit: Payer: BC Managed Care – PPO | Admitting: Pharmacist

## 2020-11-02 ENCOUNTER — Other Ambulatory Visit: Payer: Self-pay

## 2020-11-03 ENCOUNTER — Other Ambulatory Visit: Payer: Self-pay

## 2020-12-15 ENCOUNTER — Other Ambulatory Visit: Payer: Self-pay

## 2020-12-15 ENCOUNTER — Other Ambulatory Visit: Payer: Self-pay | Admitting: Pharmacist

## 2020-12-15 MED ORDER — INSULIN GLARGINE-YFGN 100 UNIT/ML ~~LOC~~ SOPN
17.0000 [IU] | PEN_INJECTOR | Freq: Two times a day (BID) | SUBCUTANEOUS | 0 refills | Status: DC
  Filled 2020-12-15: qty 15, 44d supply, fill #0
  Filled 2020-12-15: qty 9, 26d supply, fill #0
  Filled 2021-02-02: qty 6, 18d supply, fill #1

## 2020-12-15 MED FILL — Insulin Aspart Soln Pen-injector 100 Unit/ML: SUBCUTANEOUS | 30 days supply | Qty: 15 | Fill #0 | Status: AC

## 2020-12-15 MED FILL — Insulin Glargine Soln Pen-Injector 100 Unit/ML: SUBCUTANEOUS | 35 days supply | Qty: 12 | Fill #0 | Status: CN

## 2020-12-16 ENCOUNTER — Other Ambulatory Visit: Payer: Self-pay

## 2020-12-18 DIAGNOSIS — Z794 Long term (current) use of insulin: Secondary | ICD-10-CM | POA: Diagnosis not present

## 2020-12-18 DIAGNOSIS — Z041 Encounter for examination and observation following transport accident: Secondary | ICD-10-CM | POA: Diagnosis not present

## 2020-12-18 DIAGNOSIS — S199XXA Unspecified injury of neck, initial encounter: Secondary | ICD-10-CM | POA: Diagnosis not present

## 2020-12-18 DIAGNOSIS — E119 Type 2 diabetes mellitus without complications: Secondary | ICD-10-CM | POA: Diagnosis not present

## 2020-12-18 DIAGNOSIS — M542 Cervicalgia: Secondary | ICD-10-CM | POA: Diagnosis not present

## 2020-12-18 DIAGNOSIS — M549 Dorsalgia, unspecified: Secondary | ICD-10-CM | POA: Diagnosis not present

## 2020-12-18 DIAGNOSIS — Y92410 Unspecified street and highway as the place of occurrence of the external cause: Secondary | ICD-10-CM | POA: Diagnosis not present

## 2020-12-20 ENCOUNTER — Other Ambulatory Visit: Payer: Self-pay

## 2020-12-21 ENCOUNTER — Other Ambulatory Visit: Payer: Self-pay

## 2020-12-21 ENCOUNTER — Other Ambulatory Visit: Payer: Self-pay | Admitting: Nurse Practitioner

## 2020-12-21 ENCOUNTER — Ambulatory Visit: Payer: Self-pay | Admitting: *Deleted

## 2020-12-21 DIAGNOSIS — E108 Type 1 diabetes mellitus with unspecified complications: Secondary | ICD-10-CM
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
## 2020-12-21 NOTE — Telephone Encounter (Signed)
Referral made to eye doctor.

## 2020-12-21 NOTE — Telephone Encounter (Signed)
Called pt made aware of eye referral.

## 2020-12-23 ENCOUNTER — Other Ambulatory Visit: Payer: Self-pay

## 2020-12-23 ENCOUNTER — Encounter (HOSPITAL_COMMUNITY): Payer: Self-pay | Admitting: *Deleted

## 2020-12-23 ENCOUNTER — Emergency Department (HOSPITAL_COMMUNITY): Payer: BC Managed Care – PPO

## 2020-12-23 ENCOUNTER — Emergency Department (HOSPITAL_COMMUNITY)
Admission: EM | Admit: 2020-12-23 | Discharge: 2020-12-23 | Disposition: A | Discharge status: Home / Self Care | Payer: BC Managed Care – PPO | Attending: Emergency Medicine | Admitting: Emergency Medicine

## 2020-12-23 DIAGNOSIS — E119 Type 2 diabetes mellitus without complications: Secondary | ICD-10-CM | POA: Insufficient documentation

## 2020-12-23 DIAGNOSIS — S161XXD Strain of muscle, fascia and tendon at neck level, subsequent encounter: Secondary | ICD-10-CM

## 2020-12-23 DIAGNOSIS — S39012A Strain of muscle, fascia and tendon of lower back, initial encounter: Secondary | ICD-10-CM | POA: Insufficient documentation

## 2020-12-23 DIAGNOSIS — Z794 Long term (current) use of insulin: Secondary | ICD-10-CM | POA: Diagnosis not present

## 2020-12-23 DIAGNOSIS — Y92410 Unspecified street and highway as the place of occurrence of the external cause: Secondary | ICD-10-CM | POA: Diagnosis not present

## 2020-12-23 DIAGNOSIS — Z79899 Other long term (current) drug therapy: Secondary | ICD-10-CM | POA: Diagnosis not present

## 2020-12-23 DIAGNOSIS — M545 Low back pain, unspecified: Secondary | ICD-10-CM | POA: Diagnosis not present

## 2020-12-23 DIAGNOSIS — S199XXA Unspecified injury of neck, initial encounter: Secondary | ICD-10-CM | POA: Diagnosis not present

## 2020-12-23 DIAGNOSIS — I10 Essential (primary) hypertension: Secondary | ICD-10-CM | POA: Diagnosis not present

## 2020-12-23 DIAGNOSIS — S161XXA Strain of muscle, fascia and tendon at neck level, initial encounter: Secondary | ICD-10-CM | POA: Diagnosis not present

## 2020-12-23 DIAGNOSIS — S39012D Strain of muscle, fascia and tendon of lower back, subsequent encounter: Secondary | ICD-10-CM

## 2020-12-23 DIAGNOSIS — F0781 Postconcussional syndrome: Secondary | ICD-10-CM | POA: Insufficient documentation

## 2020-12-23 MED ORDER — METHOCARBAMOL 500 MG PO TABS: 500.0000 mg | ORAL_TABLET | Freq: Two times a day (BID) | ORAL | 0 refills | Status: DC

## 2020-12-23 MED ORDER — IBUPROFEN 800 MG PO TABS: 800.0000 mg | ORAL_TABLET | Freq: Three times a day (TID) | ORAL | 0 refills | Status: DC

## 2020-12-23 NOTE — ED Notes (Signed)
RN reviewed discharge instructions w/ pt. Follow up, prescriptions and pain management reviewed. Pt had no further questions. °
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
## 2020-12-29 ENCOUNTER — Ambulatory Visit: Payer: BC Managed Care – PPO | Admitting: Physician Assistant

## 2020-12-30 ENCOUNTER — Ambulatory Visit: Payer: BC Managed Care – PPO | Attending: Nurse Practitioner | Admitting: Nurse Practitioner

## 2020-12-30 ENCOUNTER — Encounter: Payer: Self-pay | Admitting: Nurse Practitioner

## 2020-12-30 ENCOUNTER — Other Ambulatory Visit: Payer: Self-pay

## 2020-12-30 DIAGNOSIS — M542 Cervicalgia: Secondary | ICD-10-CM | POA: Diagnosis not present

## 2020-12-30 DIAGNOSIS — N390 Urinary tract infection, site not specified: Secondary | ICD-10-CM

## 2020-12-30 DIAGNOSIS — M545 Low back pain, unspecified: Secondary | ICD-10-CM
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
## 2021-01-01 ENCOUNTER — Encounter: Payer: Self-pay | Admitting: Nurse Practitioner

## 2021-01-01 MED ORDER — METHOCARBAMOL 500 MG PO TABS
500.0000 mg | ORAL_TABLET | Freq: Three times a day (TID) | ORAL | 1 refills | Status: DC | PRN
  Filled 2021-01-01: qty 60, 20d supply, fill #0

## 2021-01-01 MED ORDER — IBUPROFEN 800 MG PO TABS
800.0000 mg | ORAL_TABLET | Freq: Three times a day (TID) | ORAL | 1 refills | Status: AC
Start: 2021-01-01 — End: 2021-01-31
  Filled 2021-01-01: qty 60, 20d supply, fill #0
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
## 2021-01-02 ENCOUNTER — Other Ambulatory Visit: Payer: Self-pay

## 2021-01-10 ENCOUNTER — Telehealth: Payer: Self-pay | Admitting: Nurse Practitioner

## 2021-01-10 ENCOUNTER — Other Ambulatory Visit: Payer: Self-pay

## 2021-01-10 NOTE — Telephone Encounter (Signed)
FYI

## 2021-01-10 NOTE — Telephone Encounter (Signed)
Patient came by to drop off forms to be filled out in order to return to work. Patient is aware paperwork takes 7 to 14 days to be completed. Once completed forms can be fax to [phone]. Forms placed in PCP box

## 2021-01-13 ENCOUNTER — Other Ambulatory Visit: Payer: Self-pay

## 2021-01-13 ENCOUNTER — Ambulatory Visit: Payer: BC Managed Care – PPO | Attending: Nurse Practitioner | Admitting: Nurse Practitioner

## 2021-01-13 ENCOUNTER — Encounter: Payer: Self-pay | Admitting: Nurse Practitioner

## 2021-01-13 DIAGNOSIS — F4310 Post-traumatic stress disorder, unspecified: Secondary | ICD-10-CM | POA: Diagnosis not present
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
## 2021-01-20 ENCOUNTER — Encounter: Payer: Self-pay | Admitting: Nurse Practitioner

## 2021-01-20 ENCOUNTER — Telehealth: Payer: Self-pay | Admitting: Nurse Practitioner
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
## 2021-01-20 NOTE — Telephone Encounter (Signed)
CRM has been routed to the provider.

## 2021-01-20 NOTE — Telephone Encounter (Signed)
Pt is calling to request a letter to return to work. On Monday Pt is HR is request it to state that he does not have any retrictions. Please advise Cb- [phone]

## 2021-01-20 NOTE — Telephone Encounter (Signed)
Will route to PCP for review. 

## 2021-01-24 NOTE — Telephone Encounter (Signed)
Patient called back checking status of return to work note. He says needs to state no restrictions. I told him it's in review. Please call back with further status

## 2021-01-26 NOTE — Telephone Encounter (Signed)
Updated letter in chart

## 2021-01-26 NOTE — Telephone Encounter (Signed)
Pt called to check on status of work note/ pt would like a call when he can pick this letter up/ updated letter should have no restrictions/ please advise

## 2021-01-26 NOTE — Telephone Encounter (Signed)
Pt needs letter to return to work

## 2021-01-27 NOTE — Telephone Encounter (Signed)
Pt has picked up letter. 

## 2021-02-02 ENCOUNTER — Other Ambulatory Visit: Payer: Self-pay

## 2021-02-03 ENCOUNTER — Other Ambulatory Visit: Payer: Self-pay

## 2021-02-07 ENCOUNTER — Other Ambulatory Visit: Payer: Self-pay

## 2021-02-14 ENCOUNTER — Telehealth: Payer: Self-pay | Admitting: Nurse Practitioner

## 2021-02-14 NOTE — Telephone Encounter (Signed)
Copied from CRM [id number]. Topic: Referral - Request for Referral >> Feb 14, 2021  9:53 AM Jaquita Rector A wrote: Has patient seen PCP for this complaint? No. *If NO, is insurance requiring patient see PCP for this issue before PCP can refer them? Referral for which specialty: Ophthalmologist  Preferred provider/office: [facility] Reason for referral: diabetic eye exam

## 2021-02-15 ENCOUNTER — Other Ambulatory Visit: Payer: Self-pay | Admitting: Nurse Practitioner

## 2021-02-15 DIAGNOSIS — E108 Type 1 diabetes mellitus with unspecified complications: Secondary | ICD-10-CM

## 2021-02-15 NOTE — Telephone Encounter (Signed)
Pt requesting new referral to this eye center

## 2021-02-22 ENCOUNTER — Other Ambulatory Visit: Payer: Self-pay

## 2021-02-22 ENCOUNTER — Ambulatory Visit: Payer: BC Managed Care – PPO | Attending: Nurse Practitioner | Admitting: Nurse Practitioner

## 2021-02-22 ENCOUNTER — Encounter: Payer: Self-pay | Admitting: Nurse Practitioner

## 2021-02-22 DIAGNOSIS — E1029 Type 1 diabetes mellitus with other diabetic kidney complication: Secondary | ICD-10-CM | POA: Diagnosis not present

## 2021-02-22 DIAGNOSIS — E1069 Type 1 diabetes mellitus with other specified complication: Secondary | ICD-10-CM

## 2021-02-22 DIAGNOSIS — E785 Hyperlipidemia, unspecified: Secondary | ICD-10-CM

## 2021-02-22 DIAGNOSIS — M545 Low back pain, unspecified: Secondary | ICD-10-CM

## 2021-02-22 DIAGNOSIS — R809 Proteinuria, unspecified: Secondary | ICD-10-CM

## 2021-02-22 DIAGNOSIS — E108 Type 1 diabetes mellitus with unspecified complications: Secondary | ICD-10-CM

## 2021-02-22 MED ORDER — IBUPROFEN 800 MG PO TABS
800.0000 mg | ORAL_TABLET | Freq: Three times a day (TID) | ORAL | 1 refills | Status: DC | PRN
Start: 2021-02-22 — End: 2022-11-17
  Filled 2021-02-22 – 2021-03-01 (×2): qty 60, 20d supply, fill #0

## 2021-02-22 MED ORDER — BENAZEPRIL HCL 40 MG PO TABS
40.0000 mg | ORAL_TABLET | Freq: Every day | ORAL | 1 refills | Status: DC
  Filled 2021-02-22: qty 90, 90d supply, fill #0
  Filled 2021-03-01: qty 30, 30d supply, fill #0

## 2021-02-22 MED ORDER — METHOCARBAMOL 500 MG PO TABS
500.0000 mg | ORAL_TABLET | Freq: Three times a day (TID) | ORAL | 2 refills | Status: DC | PRN
  Filled 2021-02-22 – 2021-03-01 (×2): qty 60, 20d supply, fill #0
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
## 2021-02-24 ENCOUNTER — Encounter: Payer: Self-pay | Admitting: Nurse Practitioner

## 2021-03-01 ENCOUNTER — Ambulatory Visit: Payer: BC Managed Care – PPO | Attending: Nurse Practitioner

## 2021-03-01 ENCOUNTER — Telehealth: Payer: Self-pay | Admitting: Nurse Practitioner

## 2021-03-01 ENCOUNTER — Other Ambulatory Visit: Payer: Self-pay

## 2021-03-01 DIAGNOSIS — E108 Type 1 diabetes mellitus with unspecified complications: Secondary | ICD-10-CM

## 2021-03-01 DIAGNOSIS — E785 Hyperlipidemia, unspecified: Secondary | ICD-10-CM | POA: Diagnosis not present

## 2021-03-01 DIAGNOSIS — E1069 Type 1 diabetes mellitus with other specified complication: Secondary | ICD-10-CM

## 2021-03-01 NOTE — Telephone Encounter (Signed)
Patient dropped off paperwork to be filled of by Zelda for his job. Forms will be placed in PCP box

## 2021-03-02 LAB — CMP14+EGFR
ALT: 13 IU/L (ref 0–44)
AST: 21 IU/L (ref 0–40)
Albumin/Globulin Ratio: 1.7 (ref 1.2–2.2)
Albumin: 4.1 g/dL (ref 4.0–5.0)
Alkaline Phosphatase: 51 IU/L (ref 44–121)
BUN/Creatinine Ratio: 14 (ref 9–20)
BUN: 22 mg/dL (ref 6–24)
Bilirubin Total: 0.3 mg/dL (ref 0.0–1.2)
CO2: 25 mmol/L (ref 20–29)
Calcium: 9.3 mg/dL (ref 8.7–10.2)
Chloride: 102 mmol/L (ref 96–106)
Creatinine, Ser: 1.54 mg/dL — ABNORMAL HIGH (ref 0.76–1.27)
Globulin, Total: 2.4 g/dL (ref 1.5–4.5)
Glucose: 273 mg/dL — ABNORMAL HIGH (ref 65–99)
Potassium: 4.9 mmol/L (ref 3.5–5.2)
Sodium: 141 mmol/L (ref 134–144)
Total Protein: 6.5 g/dL (ref 6.0–8.5)
eGFR: 57 mL/min/{1.73_m2} — ABNORMAL LOW (ref >59)

## 2021-03-02 LAB — HEMOGLOBIN A1C
Est. average glucose Bld gHb Est-mCnc: 246 mg/dL
Hgb A1c MFr Bld: 10.2 % — ABNORMAL HIGH (ref 4.8–5.6)

## 2021-03-02 LAB — LIPID PANEL
Chol/HDL Ratio: 3.3 ratio (ref 0.0–5.0)
Cholesterol, Total: 240 mg/dL — ABNORMAL HIGH (ref 100–199)
HDL: 72 mg/dL (ref >39)
LDL Chol Calc (NIH): 157 mg/dL — ABNORMAL HIGH (ref 0–99)
Triglycerides: 67 mg/dL (ref 0–149)
VLDL Cholesterol Cal: 11 mg/dL (ref 5–40)

## 2021-03-02 NOTE — Telephone Encounter (Signed)
Paperwork has been placed in box and pt has been scheduled and virtual appointment to discuss.

## 2021-03-03 ENCOUNTER — Encounter: Payer: Self-pay | Admitting: Nurse Practitioner

## 2021-03-05 ENCOUNTER — Encounter: Payer: Self-pay | Admitting: Nurse Practitioner

## 2021-03-08 ENCOUNTER — Other Ambulatory Visit: Payer: Self-pay

## 2021-03-08 ENCOUNTER — Encounter: Payer: Self-pay | Admitting: Nurse Practitioner

## 2021-03-08 ENCOUNTER — Ambulatory Visit: Payer: BC Managed Care – PPO | Attending: Nurse Practitioner | Admitting: Nurse Practitioner

## 2021-03-08 DIAGNOSIS — E108 Type 1 diabetes mellitus with unspecified complications: Secondary | ICD-10-CM
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
## 2021-03-09 ENCOUNTER — Other Ambulatory Visit: Payer: Self-pay | Admitting: Family Medicine

## 2021-03-09 ENCOUNTER — Other Ambulatory Visit: Payer: Self-pay | Admitting: Nurse Practitioner

## 2021-03-09 ENCOUNTER — Other Ambulatory Visit: Payer: Self-pay

## 2021-03-09 DIAGNOSIS — G40309 Generalized idiopathic epilepsy and epileptic syndromes, not intractable, without status epilepticus: Secondary | ICD-10-CM

## 2021-03-09 DIAGNOSIS — E108 Type 1 diabetes mellitus with unspecified complications: Secondary | ICD-10-CM

## 2021-03-09 MED ORDER — INSULIN GLARGINE-YFGN 100 UNIT/ML ~~LOC~~ SOPN
17.0000 [IU] | PEN_INJECTOR | Freq: Two times a day (BID) | SUBCUTANEOUS | 0 refills | Status: DC
  Filled 2021-03-09: qty 15, 44d supply, fill #0

## 2021-03-09 NOTE — Telephone Encounter (Signed)
Requested medications are due for refill today yes  Requested medications are on the active medication list yes  Last refill 02/07/21  Last visit 03/08/21  Future visit scheduled no  Notes to clinic Do not see this med/dx discussed in a visit within 12 months, please assess.
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
## 2021-03-10 ENCOUNTER — Telehealth: Payer: Self-pay | Admitting: Nurse Practitioner

## 2021-03-10 ENCOUNTER — Other Ambulatory Visit: Payer: Self-pay

## 2021-03-10 MED ORDER — SIMVASTATIN 20 MG PO TABS
20.0000 mg | ORAL_TABLET | Freq: Every day | ORAL | 3 refills | Status: DC
  Filled 2021-03-10: qty 30, 30d supply, fill #0

## 2021-03-10 MED ORDER — DIVALPROEX SODIUM ER 500 MG PO TB24
1500.0000 mg | ORAL_TABLET | Freq: Every day | ORAL | 2 refills | Status: DC
  Filled 2021-03-10: qty 90, 30d supply, fill #0

## 2021-03-10 NOTE — Telephone Encounter (Signed)
Copied from CRM [id number]. Topic: Complaint - Staff >> Mar 09, 2021  3:54 PM Marylen Ponto wrote: Date of Incident: 91/1/22 Details of complaint: Pt reports that he attempted to get an issue resolved with pharmacy but the rep he spoke with did not provide good customer service. How would the patient like to see it resolved? Pt requests that the manager or person in charge return his call at [phone]   Route to Research officer, political party.

## 2021-03-15 ENCOUNTER — Other Ambulatory Visit: Payer: Self-pay

## 2021-03-15 ENCOUNTER — Telehealth: Payer: Self-pay

## 2021-03-15 NOTE — Telephone Encounter (Signed)
Patient came into the office to pick up medications and he had a question in regards to his inulin. Pt states he has not been taking the insulin as prescribed. Pt states he has been taking it once a day. Pt asked what he should do. I called Franky Macho to see if he is able to speak with the patient. Franky Macho came up and spoke with patient

## 2021-03-16 NOTE — Telephone Encounter (Signed)
Patient has been injecting 17u once daily of Lantus with continued hyperglycemia. Endorses CBGs at home >200 - 400s. Denies any hypoglycemia. He does tell me that he used to take 30-40 units of Lantus once daily but had to split dosing d/t hypoglycemia.   I instructed patient to take 15 units of Lantus BID for now with further instructions to increase to 17 units BID (as prescribed) if home CBGs continue to be elevated. He was instructed to contact us with hypoglycemia.

## 2021-03-19 NOTE — Telephone Encounter (Signed)
Agree with instructions given by Pharmacist. Thank you

## 2021-04-10 ENCOUNTER — Other Ambulatory Visit: Payer: Self-pay

## 2021-04-10 ENCOUNTER — Other Ambulatory Visit: Payer: Self-pay | Admitting: Nurse Practitioner

## 2021-04-10 NOTE — Telephone Encounter (Signed)
Requested medications are due for refill today yes  Requested medications are on the active medication list yes  Last refill 12/16/20  Last visit 03/08/21  Future visit scheduled no  Notes to clinic No signature on rx, states to follow the instruction sheet, please assess.

## 2021-04-12 ENCOUNTER — Other Ambulatory Visit: Payer: Self-pay

## 2021-04-14 ENCOUNTER — Other Ambulatory Visit: Payer: Self-pay | Admitting: Nurse Practitioner

## 2021-04-14 ENCOUNTER — Other Ambulatory Visit: Payer: Self-pay

## 2021-04-14 MED ORDER — NOVOLOG FLEXPEN 100 UNIT/ML ~~LOC~~ SOPN
PEN_INJECTOR | SUBCUTANEOUS | 2 refills | Status: DC
  Filled 2021-04-14: qty 9, 25d supply, fill #0

## 2021-04-14 NOTE — Telephone Encounter (Signed)
Pt called stating that he is completely out of this medication and is needing to have it refilled today. Please advise.

## 2021-04-16 ENCOUNTER — Ambulatory Visit (HOSPITAL_COMMUNITY)
Admission: EM | Admit: 2021-04-16 | Discharge: 2021-04-16 | Disposition: A | Discharge status: Home / Self Care | Payer: BC Managed Care – PPO | Attending: Physician Assistant | Admitting: Physician Assistant

## 2021-04-16 ENCOUNTER — Other Ambulatory Visit: Payer: Self-pay

## 2021-04-16 ENCOUNTER — Encounter (HOSPITAL_COMMUNITY): Payer: Self-pay

## 2021-04-16 DIAGNOSIS — R030 Elevated blood-pressure reading, without diagnosis of hypertension: Secondary | ICD-10-CM

## 2021-04-16 DIAGNOSIS — Z794 Long term (current) use of insulin: Secondary | ICD-10-CM | POA: Diagnosis not present

## 2021-04-16 DIAGNOSIS — Z760 Encounter for issue of repeat prescription: Secondary | ICD-10-CM

## 2021-04-16 DIAGNOSIS — E1065 Type 1 diabetes mellitus with hyperglycemia: Secondary | ICD-10-CM

## 2021-04-16 LAB — POCT URINALYSIS DIPSTICK, ED / UC
Bilirubin Urine: NEGATIVE
Glucose, UA: 500 mg/dL — AB
Ketones, ur: NEGATIVE mg/dL
Leukocytes,Ua: NEGATIVE
Nitrite: NEGATIVE
Protein, ur: >=300 mg/dL — AB
Specific Gravity, Urine: >=1.03 (ref 1.005–1.030)
Urobilinogen, UA: 1 mg/dL (ref 0.0–1.0)
pH: 6.5 (ref 5.0–8.0)

## 2021-04-16 LAB — CBG MONITORING, ED: Glucose-Capillary: 257 mg/dL — ABNORMAL HIGH (ref 70–99)

## 2021-04-16 MED ORDER — NOVOLOG FLEXPEN 100 UNIT/ML ~~LOC~~ SOPN: PEN_INJECTOR | SUBCUTANEOUS | 0 refills | Status: DC

## 2021-04-16 NOTE — ED Provider Notes (Signed)
[facility]    CSN: [id number] Arrival date & time: 04/16/21  1655      History   Chief Complaint Chief Complaint  Patient presents with   Medication Refill    HPI [name] is a 45 y.o. male.   Patient presents today for medication refill.  He is a type I diabetic currently managed with long-acting and bolus insulin as he has not done well with the pumps in the past.  He currently has plenty of his long-acting (Lantus) insulin but has been without his NovoLog for the past few days.  He has noticed an elevation in his blood sugars prompting evaluation today.  He is taking NovoLog on a sliding scale based on carbohydrate load and Premeal glucose.  He reports that the pharmacy has given him a lower dose of NovoLog than his prescribed and has had difficulty getting this refilled prompting evaluation today.  He does report some increased polyuria as well as fatigue.  Denies any shortness of breath, nausea, vomiting.  Blood pressure is noted to be elevated today.  Patient does take antihypertensive medications including benazepril 40 mg daily.  Reports taking medication as prescribed without missing doses or noted side effects.  He is not monitoring his blood pressure at home.  Denies chest pain, shortness of breath, current headache, dizziness, vision changes.  He has been having ongoing headaches but believes this is related to his elevated blood sugar.   Past Medical History:  Diagnosis Date   Diabetes mellitus    Hypertension    Seizures (HCC)     Patient Active Problem List   Diagnosis Date Noted   Hypoglycemia due to insulin 04/02/2019   CKD (chronic kidney disease), stage III (HCC) 04/02/2019   Prolonged QT interval 04/02/2019   Microalbuminuria due to type 1 diabetes mellitus (HCC) 01/23/2017   Knee pain 03/16/2016   Scrotal pain 01/13/2016   Seizure disorder (HCC) 03/10/2015   Type 1 diabetes mellitus with hyperlipidemia (HCC) 03/10/2015   DIABETIC  PERIPHERAL NEUROPATHY 07/10/2010   UNSPECIFIED VITAMIN D DEFICIENCY 06/16/2009   PTSD (post-traumatic stress disorder) 03/26/2008   Essential hypertension, benign 03/24/2008   SLEEP DISORDER 03/24/2008   SLEEP DEPRIVATION 02/09/2008   DM (diabetes mellitus), type 1 with complications (HCC) 04/04/2007    Past Surgical History:  Procedure Laterality Date   HERNIA REPAIR         Home Medications    Prior to Admission medications   Medication Sig Start Date End Date Taking? Authorizing Provider  benazepril (LOTENSIN) 40 MG tablet Take 1 tablet (40 mg total) by mouth daily. 02/22/21 05/23/21  Claiborne Rigg, NP  divalproex (DEPAKOTE ER) 500 MG 24 hr tablet Take 3 tablets (1,500 mg total) by mouth daily. 03/10/21 06/08/21  Hoy Register, MD  glucose blood test strip Use as instructed. MUST MAKE APPT FOR FURTHER REFILLS Patient not taking: Reported on 09/28/2020 02/26/20   Claiborne Rigg, NP  ibuprofen (ADVIL) 800 MG tablet Take 1 tablet (800 mg total) by mouth every 8 (eight) hours as needed. 02/22/21   Claiborne Rigg, NP  insulin aspart (NOVOLOG FLEXPEN) 100 UNIT/ML FlexPen Monitor blood glucose 3 times daily. For blood sugars 0-150 give 0 units of insulin, 151-200 give 2 units of insulin, 201-250 give 4 units, 251-300 give 6 units, 301-350 give 8 units, 351-400 give 10 units,> 400 give 12 units and call MD 04/16/21   Raeshawn Vo, Denny Peon K, PA-C  insulin glargine-yfgn (SEMGLEE, YFGN,) 100 UNIT/ML Pen  Inject 17 Units into the skin 2 (two) times daily. 03/09/21   Claiborne Rigg, NP  Insulin Pen Needle (B-D UF III MINI PEN NEEDLES) 31G X 5 MM MISC Insert into the skin as prescribed 02/26/20   Claiborne Rigg, NP  methocarbamol (ROBAXIN) 500 MG tablet Take 1 tablet (500 mg total) by mouth every 8 (eight) hours as needed for muscle spasms. 02/22/21   Claiborne Rigg, NP  Omega-3 Fatty Acids (FISH OIL PO) Take 1 capsule by mouth daily.    [provider]  simvastatin (ZOCOR) 20 MG tablet Take 1  tablet (20 mg total) by mouth at bedtime. 03/10/21 07/08/21  Hoy Register, MD  TRUEplus Lancets 28G MISC 1 each by Does not apply route 3 (three) times daily. Patient not taking: Reported on 09/28/2020 04/01/19   [name], PA-C  insulin glargine (LANTUS) 100 UNIT/ML Solostar Pen INJECT 17 UNITS INTO THE SKIN TWICE DAILY Patient taking differently: INJECT 18 UNITS INTO THE SKIN TWICE DAILY 08/16/20 12/15/20  Claiborne Rigg, NP  insulin lispro (HUMALOG KWIKPEN) 100 UNIT/ML KwikPen Monitor blood glucose TID. For blood sugars 0-150 give 0 units of insulin, 151-200 give 2 units of insulin, 201-250 give 4 units, 251-300 give 6 units, 301-350 give 8 units, 351-400 give 10 units,> 400 give 12 units and call M.D. Discussed hypoglycemia protocol. 03/22/20 04/08/20  Claiborne Rigg, NP    Family History Family History  Problem Relation Age of Onset   Hypertension Mother    Diabetes Paternal Uncle     Social History Social History   Tobacco Use   Smoking status: Never   Smokeless tobacco: Never  Vaping Use   Vaping Use: Never used  Substance Use Topics   Alcohol use: Not Currently    Alcohol/week: 1.0 standard drink    Types: 1 Glasses of wine per week    Comment: rarely   Drug use: No     Allergies   Geodon [ziprasidone hcl] and Ziprasidone mesylate   Review of Systems Review of Systems  Constitutional:  Positive for activity change. Negative for appetite change, fatigue and fever.  Eyes:  Negative for photophobia and visual disturbance.  Respiratory:  Negative for cough and shortness of breath.   Cardiovascular:  Negative for chest pain, palpitations and leg swelling.  Gastrointestinal:  Negative for abdominal pain, diarrhea, nausea and vomiting.  Endocrine: Positive for polyuria. Negative for polydipsia and polyphagia.  Neurological:  Positive for headaches. Negative for dizziness and light-headedness.    Physical Exam Triage Vital Signs ED Triage Vitals  Enc Vitals  Group     BP 04/16/21 1813 (!) 188/87     Pulse Rate 04/16/21 1813 66     Resp 04/16/21 1813 17     Temp 04/16/21 1814 98 F (36.7 C)     Temp Source 04/16/21 1814 Oral     SpO2 04/16/21 1813 100 %     Weight --      Height --      Head Circumference --      Peak Flow --      Pain Score 04/16/21 1932 0     Pain Loc --      Pain Edu? --      Excl. in GC? --    No data found.  Updated Vital Signs BP (!) 176/89   Pulse 61   Temp 98 F (36.7 C) (Oral)   Resp 17   SpO2 99%   Visual Acuity  Right Eye Distance:   Left Eye Distance:   Bilateral Distance:    Right Eye Near:   Left Eye Near:    Bilateral Near:     Physical Exam Vitals reviewed.  Constitutional:      General: He is awake.     Appearance: Normal appearance. He is well-developed. He is not ill-appearing.     Comments: Very pleasant male appears stated age in no acute distress sitting comfortably in exam room  HENT:     Head: Normocephalic and atraumatic.     Mouth/Throat:     Pharynx: Uvula midline. No oropharyngeal exudate or posterior oropharyngeal erythema.  Cardiovascular:     Rate and Rhythm: Normal rate and regular rhythm.     Heart sounds: Normal heart sounds, S1 normal and S2 normal. No murmur heard. Pulmonary:     Effort: Pulmonary effort is normal.     Breath sounds: Normal breath sounds. No stridor. No wheezing, rhonchi or rales.     Comments: Clear to auscultation bilaterally Abdominal:     General: Bowel sounds are normal.     Palpations: Abdomen is soft.     Tenderness: There is no abdominal tenderness.  Neurological:     Mental Status: He is alert.  Psychiatric:        Behavior: Behavior is cooperative.     UC Treatments / Results  Labs (all labs ordered are listed, but only abnormal results are displayed) Labs Reviewed  CBG MONITORING, ED - Abnormal; Notable for the following components:      Result Value   Glucose-Capillary 257 (*)    All other components within normal limits   POCT URINALYSIS DIPSTICK, ED / UC - Abnormal; Notable for the following components:   Glucose, UA 500 (*)    Hgb urine dipstick MODERATE (*)    Protein, ur >=300 (*)    All other components within normal limits    EKG   Radiology No results found.  Procedures Procedures (including critical care time)  Medications Ordered in UC Medications - No data to display  Initial Impression / Assessment and Plan / UC Course  I have reviewed the triage vital signs and the nursing notes.  Pertinent labs & imaging results that were available during my care of the patient were reviewed by me and considered in my medical decision making (see chart for details).      Random glucose was found to be elevated at 257.  UA obtained showed hemoglobin and proteinuria but no ketones.  Refill of NovoLog was sent to pharmacy.  While patient was in clinic he received a phone call from the pharmacy stating that it was too early for refill (it should be refilled on 04/28/2021) and he could only get additional medication from [facility] unless he is willing to be a out-of-pocket price of multiple 100s of dollars.  Did find lower cost insulin aspart at [facility] for $57.33 with coupon.  New prescription was sent to [facility] pharmacy in the hopes that this would be verbal for patient.  Discussed that he should avoid carbohydrates and drink plenty of fluid.  He should continue with his long-acting insulin and titrate this up as previously instructed by primary care provider.  Discussed alarm symptoms that warrant emergent evaluation.  Strict return precautions given to which he expressed understanding.  Blood pressure is elevated today.  Patient denies any signs/symptoms of endorgan damage.  Encouraged him to continue medication as previously prescribed.  Recommended that he monitor his blood pressure at home and keep log for evaluation of follow-up appointment.  Discussed that if he  has any headache, dizziness, chest pain, shortness of breath, vision changes in the setting of high blood pressure he should go to the emergency room.  Recommend that he follow-up with his primary care provider first thing tomorrow for blood pressure recheck as well as for insulin adjustments to ensure ongoing refills.  Final Clinical Impressions(s) / UC Diagnoses   Final diagnoses:  Type 1 diabetes mellitus with hyperglycemia (HCC)  Medication refill  Elevated blood pressure reading     Discharge Instructions      I have called in a refill of your NovoLog.  Please take this as previously prescribed.  Monitor your blood sugar closely.  Follow-up with your endocrinologist as soon as possible.  Your blood pressure is very elevated today.  Please continue your blood pressure medication.  Monitor this at home.  If this remains elevated you develop any headache, chest pain, shortness of breath, vision changes, dizziness in the setting of high blood pressure you need to go to the emergency room.     ED Prescriptions     Medication Sig Dispense Auth. Provider   insulin aspart (NOVOLOG FLEXPEN) 100 UNIT/ML FlexPen  (Status: Discontinued) Monitor blood glucose 3 times daily. For blood sugars 0-150 give 0 units of insulin, 151-200 give 2 units of insulin, 201-250 give 4 units, 251-300 give 6 units, 301-350 give 8 units, 351-400 give 10 units,> 400 give 12 units and call MD 15 mL Kinzley Savell K, PA-C   insulin aspart (NOVOLOG FLEXPEN) 100 UNIT/ML FlexPen Monitor blood glucose 3 times daily. For blood sugars 0-150 give 0 units of insulin, 151-200 give 2 units of insulin, 201-250 give 4 units, 251-300 give 6 units, 301-350 give 8 units, 351-400 give 10 units,> 400 give 12 units and call MD 15 mL Orlen Leedy K, PA-C      PDMP not reviewed this encounter.   Jeani Hawking, PA-C 04/16/21 1944

## 2021-04-16 NOTE — ED Triage Notes (Signed)
Pt presents for a medication refill on Novolog.   States he has been having headaches and feeling fatigued. States the inside of his head is throbbing.

## 2021-04-16 NOTE — Discharge Instructions (Signed)
I have called in a refill of your NovoLog.  Please take this as previously prescribed.  Monitor your blood sugar closely.  Follow-up with your endocrinologist as soon as possible.  Your blood pressure is very elevated today.  Please continue your blood pressure medication.  Monitor this at home.  If this remains elevated you develop any headache, chest pain, shortness of breath, vision changes, dizziness in the setting of high blood pressure you need to go to the emergency room.

## 2021-04-17 ENCOUNTER — Other Ambulatory Visit: Payer: Self-pay

## 2021-04-19 ENCOUNTER — Other Ambulatory Visit: Payer: Self-pay | Admitting: Nurse Practitioner

## 2021-04-19 NOTE — Telephone Encounter (Signed)
Copied from CRM [id number]. Topic: Quick Communication - Rx Refill/Question >> Apr 19, 2021  1:08 PM Jaquita Rector A wrote: Medication: insulin glargine-yfgn (SEMGLEE, YFGN,) 100 UNIT/ML Pen   Has the patient contacted their pharmacy? Yes.  Pharmacy called in to request Rx for all insulins for patient  (Agent: If no, request that the patient contact the pharmacy for the refill.) (Agent: If yes, when and what did the pharmacy advise?)  Preferred Pharmacy (with phone number or street name): [facility] - [place], [place] - [address]  Phone:  [phone] Fax:  [phone]    Has the patient been seen for an appointment in the last year OR does the patient have an upcoming appointment? Yes.    Agent: Please be advised that RX refills may take up to 3 business days. We ask that you follow-up with your pharmacy.

## 2021-04-19 NOTE — Telephone Encounter (Signed)
Requested medications are due for refill today.  yes  Requested medications are on the active medications list.  yes  Last refill. 03/09/2021  Future visit scheduled.   no  Notes to clinic.  Medication not assigned a protocol.

## 2021-04-21 MED ORDER — INSULIN GLARGINE-YFGN 100 UNIT/ML ~~LOC~~ SOPN: 17.0000 [IU] | PEN_INJECTOR | Freq: Two times a day (BID) | SUBCUTANEOUS | 2 refills | Status: DC

## 2021-05-02 ENCOUNTER — Telehealth: Payer: Self-pay | Admitting: Neurology

## 2021-05-02 ENCOUNTER — Ambulatory Visit: Payer: Self-pay
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
## 2021-05-03 ENCOUNTER — Encounter (INDEPENDENT_AMBULATORY_CARE_PROVIDER_SITE_OTHER): Payer: BC Managed Care – PPO | Admitting: Ophthalmology

## 2021-05-03 ENCOUNTER — Other Ambulatory Visit: Payer: Self-pay

## 2021-05-04 NOTE — Telephone Encounter (Signed)
Ok for opening 05/05/21, pls put on waitlist if he is unable to come. Thanks

## 2021-05-04 NOTE — Telephone Encounter (Signed)
Called and spoke to pt and made an appt for May 10, 2021.

## 2021-05-05 ENCOUNTER — Ambulatory Visit: Payer: BC Managed Care – PPO | Admitting: Neurology

## 2021-05-05 ENCOUNTER — Other Ambulatory Visit: Payer: Self-pay | Admitting: Pharmacist

## 2021-05-05 ENCOUNTER — Other Ambulatory Visit: Payer: Self-pay

## 2021-05-05 MED ORDER — INSULIN GLARGINE-YFGN 100 UNIT/ML ~~LOC~~ SOPN
17.0000 [IU] | PEN_INJECTOR | Freq: Two times a day (BID) | SUBCUTANEOUS | 2 refills | Status: DC
  Filled 2021-05-05: qty 12, 35d supply, fill #0
  Filled 2021-07-07: qty 12, 35d supply, fill #1

## 2021-05-08 ENCOUNTER — Other Ambulatory Visit: Payer: Self-pay

## 2021-05-09 ENCOUNTER — Other Ambulatory Visit: Payer: Self-pay

## 2021-05-09 ENCOUNTER — Telehealth (HOSPITAL_BASED_OUTPATIENT_CLINIC_OR_DEPARTMENT_OTHER): Payer: BC Managed Care – PPO | Admitting: Nurse Practitioner

## 2021-05-09 DIAGNOSIS — G8929 Other chronic pain: Secondary | ICD-10-CM | POA: Diagnosis not present

## 2021-05-09 DIAGNOSIS — M545 Low back pain, unspecified: Secondary | ICD-10-CM

## 2021-05-09 MED ORDER — PENICILLIN V POTASSIUM 500 MG PO TABS
500.0000 mg | ORAL_TABLET | Freq: Four times a day (QID) | ORAL | 0 refills | Status: AC
  Filled 2021-05-09: qty 28, 7d supply, fill #0
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
## 2021-05-10 ENCOUNTER — Other Ambulatory Visit: Payer: Self-pay

## 2021-05-10 ENCOUNTER — Encounter: Payer: Self-pay | Admitting: Nurse Practitioner

## 2021-05-10 ENCOUNTER — Ambulatory Visit (INDEPENDENT_AMBULATORY_CARE_PROVIDER_SITE_OTHER): Payer: BC Managed Care – PPO | Admitting: Ophthalmology

## 2021-05-10 ENCOUNTER — Encounter (INDEPENDENT_AMBULATORY_CARE_PROVIDER_SITE_OTHER): Payer: Self-pay | Admitting: Ophthalmology

## 2021-05-10 ENCOUNTER — Encounter (INDEPENDENT_AMBULATORY_CARE_PROVIDER_SITE_OTHER): Payer: BC Managed Care – PPO | Admitting: Ophthalmology

## 2021-05-10 DIAGNOSIS — H43823 Vitreomacular adhesion, bilateral: Secondary | ICD-10-CM

## 2021-05-10 DIAGNOSIS — E103393 Type 1 diabetes mellitus with moderate nonproliferative diabetic retinopathy without macular edema, bilateral: Secondary | ICD-10-CM | POA: Diagnosis not present

## 2021-05-10 NOTE — Progress Notes (Signed)
05/10/2021     CHIEF COMPLAINT Patient presents for  Chief Complaint  Patient presents with   Retina Evaluation      HISTORY OF PRESENT ILLNESS: [name] is a 45 y.o. male who presents to the clinic today for:   HPI     Retina Evaluation   In both eyes.        Comments   NP- diabetic exam, referred by Monico Blitz NP). Pt wanted to be referred here so he informed his primary doctor. He is concerned about his eyes and diabetes. Pt last eye doctor visit was 3 years. Diabetic diagnosed at 45 years old. LBS: 169, 730 AM.          Last edited by Nelva Nay on 05/10/2021  9:18 AM.      Referring physician: Conley Rolls [place], OD [address] [place],  [place] [address]  HISTORICAL INFORMATION:   Selected notes from the MEDICAL RECORD NUMBER    Lab Results  Component Value Date   HGBA1C 10.2 (H) 03/01/2021     CURRENT MEDICATIONS: No current outpatient medications on file. (Ophthalmic Drugs)   No current facility-administered medications for this visit. (Ophthalmic Drugs)   Current Outpatient Medications (Other)  Medication Sig   benazepril (LOTENSIN) 40 MG tablet Take 1 tablet (40 mg total) by mouth daily.   divalproex (DEPAKOTE ER) 500 MG 24 hr tablet Take 3 tablets (1,500 mg total) by mouth daily.   glucose blood test strip Use as instructed. MUST MAKE APPT FOR FURTHER REFILLS (Patient not taking: Reported on 09/28/2020)   ibuprofen (ADVIL) 800 MG tablet Take 1 tablet (800 mg total) by mouth every 8 (eight) hours as needed.   insulin aspart (NOVOLOG FLEXPEN) 100 UNIT/ML FlexPen Monitor blood glucose 3 times daily. For blood sugars 0-150 give 0 units of insulin, 151-200 give 2 units of insulin, 201-250 give 4 units, 251-300 give 6 units, 301-350 give 8 units, 351-400 give 10 units,> 400 give 12 units and call MD   insulin glargine-yfgn (SEMGLEE, YFGN,) 100 UNIT/ML Pen Inject 17 Units into the skin 2 (two) times daily.   Insulin Pen Needle (B-D UF III  MINI PEN NEEDLES) 31G X 5 MM MISC Insert into the skin as prescribed   methocarbamol (ROBAXIN) 500 MG tablet Take 1 tablet (500 mg total) by mouth every 8 (eight) hours as needed for muscle spasms.   Omega-3 Fatty Acids (FISH OIL PO) Take 1 capsule by mouth daily.   penicillin v potassium (VEETID) 500 MG tablet Take 1 tablet (500 mg total) by mouth 4 (four) times daily for 7 days.   simvastatin (ZOCOR) 20 MG tablet Take 1 tablet (20 mg total) by mouth at bedtime.   TRUEplus Lancets 28G MISC 1 each by Does not apply route 3 (three) times daily. (Patient not taking: Reported on 09/28/2020)   No current facility-administered medications for this visit. (Other)      REVIEW OF SYSTEMS:    ALLERGIES Allergies  Allergen Reactions   Geodon [Ziprasidone Hcl] Other (See Comments)    Severe muscle spasms (ENTIRE BODY)- had to call EMS   Ziprasidone Mesylate Other (See Comments)    "severe muscle spasms"- Had to call EMS    PAST MEDICAL HISTORY Past Medical History:  Diagnosis Date   Diabetes mellitus    Hypertension    Seizures (HCC)    Past Surgical History:  Procedure Laterality Date   HERNIA REPAIR      FAMILY HISTORY  Family History  Problem Relation Age of Onset   Hypertension Mother    Diabetes Paternal Uncle     SOCIAL HISTORY Social History   Tobacco Use   Smoking status: Never   Smokeless tobacco: Never  Vaping Use   Vaping Use: Never used  Substance Use Topics   Alcohol use: Not Currently    Alcohol/week: 1.0 standard drink    Types: 1 Glasses of wine per week    Comment: rarely   Drug use: No         OPHTHALMIC EXAM:  Base Eye Exam     Visual Acuity (ETDRS)       Right Left   Dist cc 20/25 -1 20/25 -1    Correction: Glasses         Tonometry (Tonopen, 9:20 AM)       Right Left   Pressure 11 13         Pupils       Pupils Dark Light Shape React APD   Right PERRL 4 3 Round Brisk None   Left PERRL 5 4 Round Brisk None          Extraocular Movement       Right Left    Full Full         Neuro/Psych     Oriented x3: Yes   Mood/Affect: Normal         Dilation     Both eyes: 1.0% Mydriacyl, 2.5% Phenylephrine @ 9:20 AM           Slit Lamp and Fundus Exam     External Exam       Right Left   External Normal Normal         Slit Lamp Exam       Right Left   Lids/Lashes Normal Normal   Conjunctiva/Sclera White and quiet White and quiet   Cornea Clear Clear   Anterior Chamber Deep and quiet Deep and quiet   Iris Round and reactive Round and reactive   Lens Trace Nuclear sclerosis Trace Nuclear sclerosis   Anterior Vitreous Normal Normal         Fundus Exam       Right Left   Posterior Vitreous Normal Normal   Disc Normal Normal   C/D Ratio 0.35 0.35   Macula no macular thickening, Microaneurysms, no clinically significant macular edema, Exudates no macular thickening, Microaneurysms, no clinically significant macular edema   Vessels NPDR- Moderate NPDR- Moderate   Periphery Peripheral cystoid degeneration at the ora serrata, no hole Peripheral cystoid degeneration at the ora serrata, no hole            IMAGING AND PROCEDURES  Imaging and Procedures for 05/10/21  OCT, Retina - OU - Both Eyes       Right Eye Quality was good. Scan locations included subfoveal. Central Foveal Thickness: 277. Progression has no prior data. Findings include vitreomacular adhesion , abnormal foveal contour.   Left Eye Quality was good. Scan locations included subfoveal. Central Foveal Thickness: 272. Progression has no prior data. Findings include vitreomacular adhesion , abnormal foveal contour.   Notes Minor old fibrotic intraretinal microaneurysms noted macula OD but no active CSME  OS no active CSME  Physiologic VMA OU     Color Fundus Photography Optos - OU - Both Eyes       Right Eye Disc findings include normal observations. Macula : microaneurysms, exudates.   Left  Eye Progression has no prior  data. Macula : microaneurysms.   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      ICD-10-CM   1. Moderate nonproliferative diabetic retinopathy of both eyes without macular edema associated with type 1 diabetes mellitus (HCC)  E10.3393 OCT, Retina - OU - Both Eyes    Color Fundus Photography Optos - OU - Both Eyes    2. Vitreomacular adhesion of both eyes  H43.823 OCT, Retina - OU - Both Eyes      1.  OU with moderate nonproliferative diabetic retinopathy with a 35-year history of type 1 diabetes mellitus and currently and more recently poor blood sugar control.  2.  Patient understands critical points of continued blood sugar control monitoring is his best chance to delay onset of diabetic eye disease as well as other systemic complications.  He is seeking out potential assistance with continuous blood glucose monitoring and potential insulin pump  3.  Ophthalmic Meds Ordered this visit:  No orders of the defined types were placed in this encounter.      Return in about 6 months (around 11/07/2021) for DILATE OU, COLOR FP, OCT, OPTOS FFA R/L.  There are no Patient Instructions on file for this visit.   Explained the diagnoses, plan, and follow up with the patient and they expressed understanding.  Patient expressed understanding of the importance of proper follow up care.   Alford Highland Shey Bartmess M.D. Diseases & Surgery of the Retina and Vitreous [facility] 05/10/21     Abbreviations: M myopia (nearsighted); A astigmatism; H hyperopia (farsighted); P presbyopia; Mrx spectacle prescription;  CTL contact lenses; OD right eye; OS left eye; OU both eyes  XT exotropia; ET esotropia; PEK punctate epithelial keratitis;  PEE punctate epithelial erosions; DES dry eye syndrome; MGD meibomian gland dysfunction; ATs artificial tears; PFAT's preservative free artificial tears; NSC nuclear sclerotic cataract; PSC posterior subcapsular cataract; ERM epi-retinal membrane; PVD posterior vitreous detachment; RD retinal detachment; DM diabetes mellitus; DR diabetic retinopathy; NPDR non-proliferative diabetic retinopathy; PDR proliferative diabetic retinopathy; CSME clinically significant macular edema; DME diabetic macular edema; dbh dot blot hemorrhages; CWS cotton wool spot; POAG primary open angle glaucoma; C/D cup-to-disc ratio; HVF humphrey visual field; GVF goldmann visual field; OCT optical coherence tomography; IOP intraocular pressure; BRVO Branch retinal vein occlusion; CRVO central retinal vein occlusion; CRAO central retinal artery occlusion; BRAO branch retinal artery occlusion; RT retinal tear; SB scleral buckle; PPV pars plana vitrectomy; VH Vitreous hemorrhage; PRP panretinal laser photocoagulation; IVK intravitreal kenalog; VMT vitreomacular traction; MH Macular hole;  NVD neovascularization of the disc; NVE neovascularization elsewhere; AREDS age related eye disease study; ARMD age related macular degeneration; POAG primary open angle glaucoma; EBMD epithelial/anterior basement membrane dystrophy; ACIOL anterior chamber intraocular lens; IOL intraocular lens; PCIOL posterior chamber intraocular lens; Phaco/IOL phacoemulsification with intraocular lens placement; PRK photorefractive keratectomy; LASIK laser assisted in situ keratomileusis; HTN hypertension; DM diabetes mellitus; COPD chronic obstructive pulmonary disease

## 2021-05-15 ENCOUNTER — Ambulatory Visit: Payer: Self-pay | Admitting: *Deleted

## 2021-05-15 NOTE — Telephone Encounter (Signed)
Pt requests return call from a nurse. Pt stated that his leg was itching and he noticed that the area now is puffed up. Pt would like to discuss options or possible otc medication.  Reason for Disposition  Looks like a boil, infected sore, deep ulcer or other infected rash (spreading redness, pus)  Answer Assessment - Initial Assessment Questions 1. ONSET: "When did the swelling start?" (e.g., minutes, hours, days)     Noticed 2 weeks ago 2. LOCATION: "What part of the leg is swollen?"  "Are both legs swollen or just one leg?"     Upper right thigh- inner 3. SEVERITY: "How bad is the swelling?" (e.g., localized; mild, moderate, severe)  - Localized - small area of swelling localized to one leg  - MILD pedal edema - swelling limited to foot and ankle, pitting edema < 1/4 inch (6 mm) deep, rest and elevation eliminate most or all swelling  - MODERATE edema - swelling of lower leg to knee, pitting edema > 1/4 inch (6 mm) deep, rest and elevation only partially reduce swelling  - SEVERE edema - swelling extends above knee, facial or hand swelling present      localzed 4. REDNESS: "Does the swelling look red or infected?"     2 in round area 5. PAIN: "Is the swelling painful to touch?" If Yes, ask: "How painful is it?"   (Scale 1-10; mild, moderate or severe)     no 6. FEVER: "Do you have a fever?" If Yes, ask: "What is it, how was it measured, and when did it start?"      no 7. CAUSE: "What do you think is causing the leg swelling?"     Unsure- possible insect bite 8. MEDICAL HISTORY: "Do you have a history of heart failure, kidney disease, liver failure, or cancer?"     no 9. RECURRENT SYMPTOM: "Have you had leg swelling before?" If Yes, ask: "When was the last time?" "What happened that time?"     no 10. OTHER SYMPTOMS: "Do you have any other symptoms?" (e.g., chest pain, difficulty breathing)       Itching- skin peeled in the area 11. PREGNANCY: "Is there any chance you are pregnant?"  "When was your last menstrual period?"       na  Protocols used: Leg Swelling and Edema-A-AH

## 2021-05-15 NOTE — Progress Notes (Signed)
Established Patient Office Visit  Subjective:  Patient ID: [name], male    DOB: 1975/12/28  Age: 45 y.o. MRN: [id number]  CC:  Chief Complaint  Patient presents with   Mass    HPI [name] presents for swollen area upper thigh.   This patient is a primary care patient of Ms. Raul Del and was helping a neighbor clean the house out and discovered a deceased dog that was in rapid decay.  The patient came in close contact and cleaning this house out and removing the remains of the dog.  Following this he developed swelling in the upper right inner thigh.  About the same time he developed a dental infection was given a course of penicillin which she is completing currently.  He states the thigh area has improved but he wanted to come in to have it checked so he was worked into the schedule for this visit.  While waiting for this visit he did agree to receive a flu vaccine and this was given.  Note on arrival blood pressure elevated 180/84.  He is on Lotensin 40 mg daily only.  Patient does have type 1 diabetes and notes he does not check his blood sugar that often at home. Last hemoglobin A1c was 10 in August of this year  Past Medical History:  Diagnosis Date   Diabetes mellitus    Hypertension    Seizures ([place])     Past Surgical History:  Procedure Laterality Date   HERNIA REPAIR      Family History  Problem Relation Age of Onset   Hypertension Mother    Diabetes Paternal Uncle     Social History   Socioeconomic History   Marital status: Single    Spouse name: Not on file   Number of children: 3   Years of education: Not on file   Highest education level: Some college, no degree  Occupational History   Not on file  Tobacco Use   Smoking status: Never   Smokeless tobacco: Never  Vaping Use   Vaping Use: Never used  Substance and Sexual Activity   Alcohol use: Not Currently    Alcohol/week: 1.0 standard drink    Types: 1 Glasses of wine per week     Comment: rarely   Drug use: No   Sexual activity: Not Currently  Other Topics Concern   Not on file  Social History Narrative   Pt is left handed   Lives alone in 2 story home   Has 3 children   Some college education   Worked as Equities trader   Social Determinants of Health   Financial Resource Strain: Not on file  Food Insecurity: Not on file  Transportation Needs: Not on file  Physical Activity: Not on file  Stress: Not on file  Social Connections: Not on file  Intimate Partner Violence: Not on file    Outpatient Medications Prior to Visit  Medication Sig Dispense Refill   divalproex (DEPAKOTE ER) 500 MG 24 hr tablet Take 3 tablets (1,500 mg total) by mouth daily. 90 tablet 2   glucose blood test strip Use as instructed. MUST MAKE APPT FOR FURTHER REFILLS 100 each 0   ibuprofen (ADVIL) 800 MG tablet Take 1 tablet (800 mg total) by mouth every 8 (eight) hours as needed. 60 tablet 1   insulin aspart (NOVOLOG FLEXPEN) 100 UNIT/ML FlexPen Monitor blood glucose 3 times daily. For blood sugars 0-150 give 0 units of insulin, 151-200  give 2 units of insulin, 201-250 give 4 units, 251-300 give 6 units, 301-350 give 8 units, 351-400 give 10 units,> 400 give 12 units and call MD 15 mL 0   insulin glargine-yfgn (SEMGLEE, YFGN,) 100 UNIT/ML Pen Inject 17 Units into the skin 2 (two) times daily. 15 mL 2   Insulin Pen Needle (B-D UF III MINI PEN NEEDLES) 31G X 5 MM MISC Insert into the skin as prescribed 90 each 1   methocarbamol (ROBAXIN) 500 MG tablet Take 1 tablet (500 mg total) by mouth every 8 (eight) hours as needed for muscle spasms. 60 tablet 2   penicillin v potassium (VEETID) 500 MG tablet Take 1 tablet (500 mg total) by mouth 4 (four) times daily for 7 days. 28 tablet 0   simvastatin (ZOCOR) 20 MG tablet Take 1 tablet (20 mg total) by mouth at bedtime. 30 tablet 3   TRUEplus Lancets 28G MISC 1 each by Does not apply route 3 (three) times daily. 100 each 11   benazepril (LOTENSIN)  40 MG tablet Take 1 tablet (40 mg total) by mouth daily. 90 tablet 1   Omega-3 Fatty Acids (FISH OIL PO) Take 1 capsule by mouth daily.     No facility-administered medications prior to visit.    Allergies  Allergen Reactions   Geodon [Ziprasidone Hcl] Other (See Comments)    Severe muscle spasms (ENTIRE BODY)- had to call EMS   Ziprasidone Mesylate Other (See Comments)    "severe muscle spasms"- Had to call EMS    ROS Review of Systems  Constitutional: Negative.   HENT: Negative.  Negative for ear pain, postnasal drip, rhinorrhea, sinus pressure, sore throat, trouble swallowing and voice change.   Eyes: Negative.   Respiratory: Negative.  Negative for apnea, cough, choking, chest tightness, shortness of breath, wheezing and stridor.   Cardiovascular: Negative.  Negative for chest pain, palpitations and leg swelling.  Gastrointestinal: Negative.  Negative for abdominal distention, abdominal pain, nausea and vomiting.  Genitourinary: Negative.   Musculoskeletal: Negative.  Negative for arthralgias and myalgias.  Skin:  Positive for rash and wound.  Allergic/Immunologic: Negative.  Negative for environmental allergies and food allergies.  Neurological: Negative.  Negative for dizziness, syncope, weakness and headaches.  Hematological: Negative.  Negative for adenopathy. Does not bruise/bleed easily.  Psychiatric/Behavioral: Negative.  Negative for agitation and sleep disturbance. The patient is not nervous/anxious.      Objective:    Physical Exam Vitals reviewed.  Constitutional:      Appearance: Normal appearance. He is well-developed. He is not diaphoretic.  HENT:     Head: Normocephalic and atraumatic.     Nose: No nasal deformity, septal deviation, mucosal edema or rhinorrhea.     Right Sinus: No maxillary sinus tenderness or frontal sinus tenderness.     Left Sinus: No maxillary sinus tenderness or frontal sinus tenderness.     Mouth/Throat:     Pharynx: No  oropharyngeal exudate.  Eyes:     General: No scleral icterus.    Conjunctiva/sclera: Conjunctivae normal.     Pupils: Pupils are equal, round, and reactive to light.  Neck:     Thyroid: No thyromegaly.     Vascular: No carotid bruit or JVD.     Trachea: Trachea normal. No tracheal tenderness or tracheal deviation.  Cardiovascular:     Rate and Rhythm: Normal rate and regular rhythm.     Chest Wall: PMI is not displaced.     Pulses: Normal pulses. No decreased pulses.  Heart sounds: Normal heart sounds, S1 normal and S2 normal. Heart sounds not distant. No murmur heard. No systolic murmur is present.  No diastolic murmur is present.    No friction rub. No gallop. No S3 or S4 sounds.  Pulmonary:     Effort: No tachypnea, accessory muscle usage or respiratory distress.     Breath sounds: No stridor. No decreased breath sounds, wheezing, rhonchi or rales.  Chest:     Chest wall: No tenderness.  Abdominal:     General: Bowel sounds are normal. There is no distension.     Palpations: Abdomen is soft. Abdomen is not rigid.     Tenderness: There is no abdominal tenderness. There is no guarding or rebound.  Musculoskeletal:        General: Normal range of motion.     Cervical back: Normal range of motion and neck supple. No edema, erythema or rigidity. No muscular tenderness. Normal range of motion.  Lymphadenopathy:     Head:     Right side of head: No submental or submandibular adenopathy.     Left side of head: No submental or submandibular adenopathy.     Cervical: No cervical adenopathy.  Skin:    General: Skin is warm and dry.     Coloration: Skin is not jaundiced or pale.     Findings: Lesion present. No bruising, erythema or rash.     Nails: There is no clubbing.     Comments: On the right upper inner thigh near the pubic area there is a healing area that likely was a small skin abscess that has self drained and likely treated with the antibiotic given for the mouth  infection  Neurological:     Mental Status: He is alert and oriented to person, place, and time.     Sensory: No sensory deficit.  Psychiatric:        Speech: Speech normal.        Behavior: Behavior normal.    BP (!) 180/84   Pulse (!) 53   Resp 16   Wt 200 lb 12.8 oz (91.1 kg)   SpO2 100%   BMI 28.01 kg/m  Wt Readings from Last 3 Encounters:  05/16/21 200 lb 12.8 oz (91.1 kg)  12/23/20 201 lb 1 oz (91.2 kg)  09/28/20 201 lb (91.2 kg)     Health Maintenance Due  Topic Date Due   COVID-19 Vaccine (1) Never done   OPHTHALMOLOGY EXAM  Never done   Pneumococcal Vaccine 8-80 Years old (2 - PCV) 07/09/2008   TETANUS/TDAP  04/03/2017   FOOT EXAM  01/23/2018    There are no preventive care reminders to display for this patient.  Lab Results  Component Value Date   TSH 2.080 04/09/2018   Lab Results  Component Value Date   WBC 5.5 09/28/2020   HGB 12.8 (L) 09/28/2020   HCT 38.4 09/28/2020   MCV 89 09/28/2020   PLT 174 09/28/2020   Lab Results  Component Value Date   NA 141 03/01/2021   K 4.9 03/01/2021   CO2 25 03/01/2021   GLUCOSE 273 (H) 03/01/2021   BUN 22 03/01/2021   CREATININE 1.54 (H) 03/01/2021   BILITOT 0.3 03/01/2021   ALKPHOS 51 03/01/2021   AST 21 03/01/2021   ALT 13 03/01/2021   PROT 6.5 03/01/2021   ALBUMIN 4.1 03/01/2021   CALCIUM 9.3 03/01/2021   ANIONGAP 4 (L) 04/03/2019   EGFR 57 (L) 03/01/2021   Lab Results  Component Value Date   CHOL 240 (H) 03/01/2021   Lab Results  Component Value Date   HDL 72 03/01/2021   Lab Results  Component Value Date   LDLCALC 157 (H) 03/01/2021   Lab Results  Component Value Date   TRIG 67 03/01/2021   Lab Results  Component Value Date   CHOLHDL 3.3 03/01/2021   Lab Results  Component Value Date   HGBA1C 10.2 (H) 03/01/2021      Assessment & Plan:   Problem List Items Addressed This Visit       Cardiovascular and Mediastinum   Essential hypertension, benign (Chronic)    Blood  pressure not well controlled plan to switch Lotensin to valsartan HCT 320/25 1 daily      Relevant Medications   valsartan-hydrochlorothiazide (DIOVAN-HCT) 320-25 MG tablet     Endocrine   DM (diabetes mellitus), type 1 with complications (HCC) (Chronic)    Patient would benefit from visit with clinical pharmacy we will schedule this at this visit no change in insulin program      Relevant Medications   valsartan-hydrochlorothiazide (DIOVAN-HCT) 320-25 MG tablet   Type 1 diabetes mellitus with hyperlipidemia (HCC)   Relevant Medications   valsartan-hydrochlorothiazide (DIOVAN-HCT) 320-25 MG tablet     Musculoskeletal and Integument   RESOLVED: Skin infection - Primary    Suspect small area of a skin infection in the right upper thigh that was treated incidentally by the penicillin given for the oral infection  No additional treatment needed      Other Visit Diagnoses     Need for immunization against influenza       Relevant Orders   Flu Vaccine QUAD 46moIM (Fluarix, Fluzone & Alfiuria Quad PF) (Completed)      Patient did receive flu vaccine Meds ordered this encounter  Medications   valsartan-hydrochlorothiazide (DIOVAN-HCT) 320-25 MG tablet    Sig: Take 1 tablet by mouth daily.    Dispense:  90 tablet    Refill:  3    Follow-up: Return if symptoms worsen or fail to improve.    PAsencion Noble MD

## 2021-05-15 NOTE — Telephone Encounter (Signed)
Call to patient- patient describes a red swollen area-2" upper inner thigh that appeared 2 weeks ago. Itches but no pain. Patient has tried treating -but the skin has peeled but not healed. Appointment scheduled for evaluation.

## 2021-05-16 ENCOUNTER — Encounter: Payer: Self-pay | Admitting: Critical Care Medicine

## 2021-05-16 ENCOUNTER — Other Ambulatory Visit: Payer: Self-pay

## 2021-05-16 ENCOUNTER — Ambulatory Visit: Payer: BC Managed Care – PPO | Attending: Critical Care Medicine | Admitting: Critical Care Medicine

## 2021-05-16 VITALS — BP 180/84 | HR 53 | Resp 16 | Wt 200.8 lb

## 2021-05-16 DIAGNOSIS — E1069 Type 1 diabetes mellitus with other specified complication: Secondary | ICD-10-CM | POA: Diagnosis not present

## 2021-05-16 DIAGNOSIS — I10 Essential (primary) hypertension: Secondary | ICD-10-CM | POA: Diagnosis not present

## 2021-05-16 DIAGNOSIS — E785 Hyperlipidemia, unspecified: Secondary | ICD-10-CM

## 2021-05-16 DIAGNOSIS — Z23 Encounter for immunization: Secondary | ICD-10-CM

## 2021-05-16 DIAGNOSIS — L089 Local infection of the skin and subcutaneous tissue, unspecified: Secondary | ICD-10-CM | POA: Diagnosis not present

## 2021-05-16 DIAGNOSIS — E108 Type 1 diabetes mellitus with unspecified complications: Secondary | ICD-10-CM

## 2021-05-16 MED ORDER — VALSARTAN-HYDROCHLOROTHIAZIDE 320-25 MG PO TABS: 1.0000 | ORAL_TABLET | Freq: Every day | ORAL | 3 refills | Status: DC

## 2021-05-16 NOTE — Assessment & Plan Note (Signed)
Patient would benefit from visit with clinical pharmacy we will schedule this at this visit no change in insulin program

## 2021-05-16 NOTE — Patient Instructions (Signed)
Flu vaccine was given  The place in the leg is healing rapidly does not need further treatments likely the penicillin you got for your mouth has treated this area as well likely was a skin infection now resolved  Blood pressure is elevated we will discontinue Lotensin and begin valsartan HCT 1 pill daily, this was sent to your local pharmacy on [facility]  Return to see Franky Macho in 2 weeks to check your blood pressure and your diabetes care  Keep follow-ups with your primary care provider in future

## 2021-05-16 NOTE — Assessment & Plan Note (Signed)
Blood pressure not well controlled plan to switch Lotensin to valsartan HCT 320/25 1 daily

## 2021-05-16 NOTE — Assessment & Plan Note (Signed)
Suspect small area of a skin infection in the right upper thigh that was treated incidentally by the penicillin given for the oral infection  No additional treatment needed

## 2021-05-19 ENCOUNTER — Ambulatory Visit
Payer: BC Managed Care – PPO | Attending: Nurse Practitioner | Admitting: Rehabilitative and Restorative Service Providers"

## 2021-05-19 DIAGNOSIS — M6281 Muscle weakness (generalized): Secondary | ICD-10-CM | POA: Insufficient documentation

## 2021-05-19 DIAGNOSIS — M545 Low back pain, unspecified: Secondary | ICD-10-CM | POA: Insufficient documentation

## 2021-05-19 DIAGNOSIS — M25561 Pain in right knee: Secondary | ICD-10-CM | POA: Insufficient documentation

## 2021-05-19 DIAGNOSIS — G8929 Other chronic pain: Secondary | ICD-10-CM | POA: Insufficient documentation

## 2021-05-19 DIAGNOSIS — R262 Difficulty in walking, not elsewhere classified: Secondary | ICD-10-CM | POA: Insufficient documentation

## 2021-05-23 ENCOUNTER — Ambulatory Visit: Payer: Self-pay | Admitting: *Deleted

## 2021-05-23 NOTE — Telephone Encounter (Signed)
C/o medication reaction from valsartan- hydrochlorothiazide . Reports started taking medication 3 days ago and today noted swelling from feet to legs and thighs. Thighs noted with "hives". Redness pin point , no blistering . C/o arms and legs itching. When applying compression stocking feels like a belt. Denies chest pain,difficulty breathing, denies face, tongue swelling. Denies fever, reports chills at times and dizziness. Patient at work and unable to check B/P . Recommended patient to go to UC or ED now due to leg swelling and hives to thighs. Patient reports he did not take dose of valsartan this am.  Recommended antihistamine or benadryl if itching severe. May apply hydrocortisone cream for itching .Care advise given. Patient requesting what to take if he can not tolerate diovan .Patient verbalized understanding and to go to [facility] or ED and call 911 if symptoms worsen. Please advise .

## 2021-05-23 NOTE — Telephone Encounter (Signed)
Reason for Disposition  [1] Caller has URGENT medicine question about med that PCP or specialist prescribed AND [2] triager unable to answer question  Answer Assessment - Initial Assessment Questions 1. NAME of MEDICATION: "What medicine are you calling about?"     Valsartan- hydrochlorothiazide  2. QUESTION: "What is your question?" (e.g., double dose of medicine, side effect)     Can medication cause leg / feet swelling up thighs and red hives to thighs  3. PRESCRIBING HCP: "Who prescribed it?" Reason: if prescribed by specialist, call should be referred to that group.     PCP 4. SYMPTOMS: "Do you have any symptoms?"     Yes ft to thighs swollen, thighs with hives/rash  5. SEVERITY: If symptoms are present, ask "Are they mild, moderate or severe?"     Arms itching legs thighs swelling hives to thighs 6. PREGNANCY:  "Is there any chance that you are pregnant?" "When was your last menstrual period?"     na  Protocols used: Medication Question Call-A-AH

## 2021-05-23 NOTE — Telephone Encounter (Signed)
See encounter

## 2021-05-24 ENCOUNTER — Other Ambulatory Visit: Payer: Self-pay

## 2021-05-24 ENCOUNTER — Ambulatory Visit: Payer: BC Managed Care – PPO

## 2021-05-24 DIAGNOSIS — M6281 Muscle weakness (generalized): Secondary | ICD-10-CM | POA: Diagnosis not present

## 2021-05-24 DIAGNOSIS — G8929 Other chronic pain: Secondary | ICD-10-CM | POA: Diagnosis not present

## 2021-05-24 DIAGNOSIS — R262 Difficulty in walking, not elsewhere classified: Secondary | ICD-10-CM

## 2021-05-24 DIAGNOSIS — M25561 Pain in right knee: Secondary | ICD-10-CM

## 2021-05-24 DIAGNOSIS — M545 Low back pain, unspecified: Secondary | ICD-10-CM | POA: Diagnosis not present
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
## 2021-05-24 NOTE — Patient Instructions (Signed)
Initiated HEP Access Code: [email or web address] URL: [email or web address] Date: 05/24/2021 Prepared by: Mikey Kirschner  Exercises Standing Hamstring Stretch on Chair - 2 x daily - 7 x weekly - 1 sets - 3 reps - 30 hold Standing Quad Stretch with Table and Chair Support - 2 x daily - 7 x weekly - 1 sets - 3 reps - 30 sec hold  Highly encouraged patient to focus on getting his blood pressure and diabetes under control.   Encouraged to follow up with primary MD about appt with Dr. Lucianne Muss.

## 2021-05-25 ENCOUNTER — Other Ambulatory Visit: Payer: Self-pay

## 2021-05-25 ENCOUNTER — Other Ambulatory Visit: Payer: Self-pay | Admitting: Nurse Practitioner

## 2021-05-25 MED ORDER — BENAZEPRIL HCL 40 MG PO TABS
40.0000 mg | ORAL_TABLET | Freq: Every day | ORAL | 3 refills | Status: DC
  Filled 2021-05-25 – 2021-05-29 (×2): qty 90, 90d supply, fill #0

## 2021-05-25 NOTE — Telephone Encounter (Signed)
Please follow up

## 2021-05-25 NOTE — Telephone Encounter (Signed)
STOP DIOVAN. Restart benazepril. Schedule with luke for BP check in  2 weeks.

## 2021-05-29 ENCOUNTER — Other Ambulatory Visit: Payer: Self-pay

## 2021-05-30 ENCOUNTER — Other Ambulatory Visit: Payer: Self-pay

## 2021-05-30 ENCOUNTER — Ambulatory Visit: Payer: BC Managed Care – PPO | Admitting: Physical Therapy

## 2021-05-30 DIAGNOSIS — M6281 Muscle weakness (generalized): Secondary | ICD-10-CM

## 2021-05-30 DIAGNOSIS — M25561 Pain in right knee: Secondary | ICD-10-CM | POA: Diagnosis not present

## 2021-05-30 DIAGNOSIS — G8929 Other chronic pain: Secondary | ICD-10-CM | POA: Diagnosis not present

## 2021-05-30 DIAGNOSIS — R262 Difficulty in walking, not elsewhere classified: Secondary | ICD-10-CM

## 2021-05-30 DIAGNOSIS — M545 Low back pain, unspecified: Secondary | ICD-10-CM | POA: Diagnosis not present

## 2021-05-30 NOTE — Therapy (Addendum)
[facility] @ [place], [place], [address] Phone: [phone]   Fax:  [phone]  Physical Therapy Treatment/Discharge Summary   Patient Details  Name: [name] MRN: [id number] Date of Birth: 1976-05-11 Referring Provider (PT): Geryl Rankins, NP   Encounter Date: 05/30/2021   PT End of Session - 05/30/21 1536     Visit Number 2    Number of Visits 30    Date for PT Re-Evaluation 07/21/21    Authorization Type BCBS    Authorization Time Period 07-09-20 through 07-08-21    Authorization - Visit Number 30    Authorization - Number of Visits 2    PT Start Time 2831    PT Stop Time 1605    PT Time Calculation (min) 41 min    Activity Tolerance Patient tolerated treatment well             Past Medical History:  Diagnosis Date   Diabetes mellitus    Hypertension    Seizures ([place])     Past Surgical History:  Procedure Laterality Date   HERNIA REPAIR      There were no vitals filed for this visit.   Subjective Assessment - 05/30/21 1524     Subjective I started doing the stretches and that helped.  No pain b/c I haven't really done anything.  If I'm working, heavy lifting aggravates.    Currently in Pain? No/denies    Pain Score 0-No pain    Pain Location Back    Multiple Pain Sites Yes    Pain Location Knee    Aggravating Factors  bent a lot in the car    Pain Relieving Factors straight                               [facility] Adult PT Treatment/Exercise - 05/30/21 0001       Lumbar Exercises: Stretches   Other Lumbar Stretch Exercise decompression series; head push, shoulder press down, whole leg press down, leg lengthener 5x each      Lumbar Exercises: Aerobic   Nustep new model L4 7 min      Lumbar Exercises: Machines for Strengthening   Other Lumbar Machine Exercise standing lat bar pulldowns 25# 15x      Lumbar Exercises: Standing   Other Standing Lumbar  Exercises squats holding onto the sink 7x    Other Standing Lumbar Exercises heel raises 10x      Lumbar Exercises: Seated   Other Seated Lumbar Exercises green ball roll outs 5x    Other Seated Lumbar Exercises green band rows 12x      Lumbar Exercises: Supine   AB Set Limitations Pilates beats 10x   UE lift with inhale, lower with exhale   Other Supine Lumbar Exercises LEs on green ball rolls 10x    Other Supine Lumbar Exercises LEs on green ball rock side to side 10x                       PT Short Term Goals - 05/24/21 1737       PT SHORT TERM GOAL #1   Title Independence with initial HEP    Time 4    Period Weeks    Status New    Target Date 06/21/21      PT SHORT TERM GOAL #2   Title Gait to be symmetrical  Time 4    Period Weeks    Status New    Target Date 06/21/21               PT Long Term Goals - 05/24/21 1738       PT LONG TERM GOAL #1   Title Indpendence with advanced HEP    Time 8    Period Weeks    Status New    Target Date 07/19/21      PT LONG TERM GOAL #2   Title Able to transition to new job without concern for ability to perform job duties.    Time 8    Period Weeks    Status New    Target Date 07/19/21      PT LONG TERM GOAL #3   Title Patient to report pain no greater than 2/10    Time 8    Period Weeks    Status New    Target Date 07/19/21      PT LONG TERM GOAL #4   Title Patient to be able to do functional squat with proper symmetry    Time 8    Period Weeks    Status New    Target Date 07/19/21      PT LONG TERM GOAL #5   Title Patient to have had follow up and control of symptoms with primary MD and with Dr. Dwyane Dee for diabetic management and Htn    Time 8    Period Weeks    Status New    Target Date 07/19/21                   Plan - 05/30/21 1602     Clinical Impression Statement The patient is able to participate in low level mobility ex's to facilitate healing.  Initially had difficulty  bending right knee but by the end of session able to flex with relative ease on Nu-Step.  Verbal cues to coordinate breathing and for best ex technique.  He reports his back and knee both feel better at the end of treatment session than when he came in today.    Personal Factors and Comorbidities Comorbidity 1;Fitness;Profession;Comorbidity 2    Examination-Activity Limitations Bend;Lift;Squat;Stand;Transfers    Examination-Participation Restrictions Occupation;Community Activity    Rehab Potential Good    PT Frequency 2x / week    PT Duration 8 weeks    PT Treatment/Interventions ADLs/Self Care Home Management;Aquatic Therapy;Biofeedback;Traction;Moist Heat;Iontophoresis 36m/ml Dexamethasone;Electrical Stimulation;Cryotherapy;Ultrasound;Gait training;Therapeutic exercise;Therapeutic activities;Functional mobility training;Stair training;Balance training;Neuromuscular re-education;Patient/family education;Manual techniques;Passive range of motion;Vasopneumatic Device;Taping;Dry needling;Spinal Manipulations;Joint Manipulations    PT Next Visit Plan lumbar and LE mobility ex's for pain modulation and return to function    PT Home Exercise Plan Access Code: [email or web address]            Patient will benefit from skilled therapeutic intervention in order to improve the following deficits and impairments:  Abnormal gait, Decreased balance, Difficulty walking, Hypomobility, Increased muscle spasms, Improper body mechanics, Decreased strength, Increased fascial restricitons, Impaired flexibility, Pain  Visit Diagnosis: Chronic right-sided low back pain without sciatica  Muscle weakness (generalized)  Acute pain of right knee  Difficulty in walking, not elsewhere classified   PHYSICAL THERAPY DISCHARGE SUMMARY  Visits from Start of Care: 2  Current functional level related to goals / functional outcomes: The patient did not attend today's appt and when called he stated he had been terminated from  his job.  He requested discharge from PT since his circumstances  have changed.     Remaining deficits: As above   Education / Equipment: Basic HEP   Patient agrees to discharge. Patient goals were not met. Patient is being discharged due to the patient's request.   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 ([place]) 01/23/2017   Knee pain 03/16/2016   Scrotal pain 01/13/2016   Seizure disorder ([place]) 03/10/2015   Type 1 diabetes mellitus with hyperlipidemia ([place]) 03/10/2015   DIABETIC PERIPHERAL NEUROPATHY 07/10/2010   UNSPECIFIED VITAMIN D DEFICIENCY 06/16/2009   PTSD (post-traumatic stress disorder) 03/26/2008   Essential hypertension, benign 03/24/2008   SLEEP DISORDER 03/24/2008   SLEEP DEPRIVATION 02/09/2008   DM (diabetes mellitus), type 1 with complications ([place]) 69/16/7561   Ruben Im, PT 05/30/21 4:16 PM Phone: [phone] Fax: [phone]  Alvera Singh, PT 05/30/2021, 4:15 PM  [facility] @ [address] [place], [place], [address] Phone: [phone]   Fax:  [phone]  Name: [name] MRN: [id number] Date of Birth: 06/20/1976

## 2021-05-31 ENCOUNTER — Other Ambulatory Visit: Payer: Self-pay

## 2021-06-06 ENCOUNTER — Other Ambulatory Visit: Payer: Self-pay

## 2021-06-07 ENCOUNTER — Ambulatory Visit: Payer: Self-pay | Admitting: *Deleted

## 2021-06-07 NOTE — Telephone Encounter (Signed)
Per agent: "The patient shares that they have had gum sensitivity and discomfort for multiple days   The patient shares that they've had a history of dental concerns and would like to address them further with a member of staff when possible   The patient has experienced significant oral swelling which leads to further discomfort."  Pt reports was on antibiotics for similar symptoms, completed "About a week ago." States was getting better, "Now it's all back." Reports gums bleed at times, swelling bottom gums, filling out back, bottom tooth. Denies fever.Requesting assistance with dental issues. Care advise given per protocol. Advised to CB if worsening. Assured pt NT would route to practice for PCPs review. Please advise where pt may get dental assist.  CB# [phone]       Reason for Disposition  All other mouth symptoms (Exceptions: dry mouth from not drinking enough liquids, chapped lips)    Multiple dental issues, gums bleeding  Answer Assessment - Initial Assessment Questions 1. SYMPTOM: "What's the main symptom you're concerned about?" (e.g., chapped lips, dry mouth, lump, sores)     Gus sore, swollen 2. ONSET: "When did the  *No Answer*  start?"     Few weeks ago, completed ATBs about a week ago.  3. PAIN: "Is there any pain?" If Yes, ask: "How bad is it?" (Scale: 1-10; mild, moderate, severe)   - MILD (1-3):  doesn't interfere with eating or normal activities   - MODERATE (4-7): interferes with eating some solids and normal activities   - SEVERE (8-10):  excruciating pain, interferes with most normal activities   - SEVERE DYSPHAGIA: can't swallow liquids, drooling     Taking 800mg  IBU prescribed for back. Does not take every day 4. CAUSE: "What do you think is causing the symptoms?"     Maybe infection. 5. OTHER SYMPTOMS: "Do you have any other symptoms?" (e.g., fever, sore throat, toothache, swelling)     Swelling bottom gum, right side. Entire bottom on mouth "Tighter."   Can taste blood from gums at times. Back bottom area painful, filling out "For a while."  Protocols used: Mouth Symptoms-A-AH

## 2021-06-08 ENCOUNTER — Other Ambulatory Visit: Payer: Self-pay

## 2021-06-08 NOTE — Telephone Encounter (Signed)
NEEDS to schedule with a dentist under his insurance plan. Symptoms sound more serious than what would be treated in primary care. Could have abscess which would need to be treated by dentist. Dental pain can be treated with over the counter ibuprofen

## 2021-06-09 ENCOUNTER — Telehealth: Payer: Self-pay | Admitting: Nurse Practitioner

## 2021-06-09 NOTE — Telephone Encounter (Signed)
Copied from CRM [id number]. Topic: Referral - Request for Referral >> Jun 07, 2021  1:51 PM Gaetana Michaelis A wrote: Has patient seen PCP for this complaint? yes. *If NO, is insurance requiring patient see PCP for this issue before PCP can refer them? Referral for which specialty: Dentistry  Preferred provider/office: Patient has no preference  Reason for referral: Patient needs a root canal

## 2021-06-13 ENCOUNTER — Ambulatory Visit: Payer: BC Managed Care – PPO | Attending: Nurse Practitioner | Admitting: Physical Therapy

## 2021-06-13 DIAGNOSIS — G8929 Other chronic pain: Secondary | ICD-10-CM | POA: Insufficient documentation

## 2021-06-13 DIAGNOSIS — R262 Difficulty in walking, not elsewhere classified: Secondary | ICD-10-CM | POA: Insufficient documentation

## 2021-06-13 DIAGNOSIS — M545 Low back pain, unspecified: Secondary | ICD-10-CM | POA: Insufficient documentation

## 2021-06-13 DIAGNOSIS — M6281 Muscle weakness (generalized): Secondary | ICD-10-CM | POA: Insufficient documentation

## 2021-06-13 DIAGNOSIS — M25561 Pain in right knee: Secondary | ICD-10-CM | POA: Insufficient documentation

## 2021-06-20 ENCOUNTER — Ambulatory Visit: Payer: Self-pay

## 2021-06-20 ENCOUNTER — Encounter: Payer: Self-pay | Admitting: Physical Therapy

## 2021-06-20 NOTE — Telephone Encounter (Signed)
°  Chief Complaint: Dental pain and swelling Symptoms: Pain - bottom tooth Frequency: Started last month Pertinent Negatives: Patient denies  Disposition: [] ED /[] Urgent Care (no appt availability in office) / [] Appointment(In office/virtual)/ []  [facility]/ [] Home Care/ [] Refused Recommended Disposition  Additional Notes: Pt. Reports he called last month about dental pain and swelling and never heard back. States he has lost his job and will lose as well. Also was supposed to be placed on a new BP medicine, "but no one called me back." Asking for appointment. Also given Mobile unit location today. Please advise pt.      Answer Assessment - Initial Assessment Questions 1. LOCATION: "Which tooth is hurting?"  (e.g., right-side/left-side, upper/lower, front/back)     Bottom tooth and gum swelling 2. ONSET: "When did the toothache start?"  (e.g., hours, days)      Last month 3. SEVERITY: "How bad is the toothache?"  (Scale 1-10; mild, moderate or severe)   - MILD (1-3): doesn't interfere with chewing    - MODERATE (4-7): interferes with chewing, interferes with normal activities, awakens from sleep     - SEVERE (8-10): unable to eat, unable to do any normal activities, excruciating pain        Moderate 4. SWELLING: "Is there any visible swelling of your face?"     No 5. OTHER SYMPTOMS: "Do you have any other symptoms?" (e.g., fever)     Bottom lip numb on outside 6. PREGNANCY: "Is there any chance you are pregnant?" "When was your last menstrual period?"     N/a  Protocols used: Toothache-A-AH

## 2021-06-20 NOTE — Telephone Encounter (Signed)
Spoke to pt about calling his insurance to see what dentist is in network. Advised pt to get an orange card application.

## 2021-06-27 ENCOUNTER — Ambulatory Visit: Payer: BC Managed Care – PPO

## 2021-07-04 ENCOUNTER — Encounter: Payer: Self-pay | Admitting: Physical Therapy

## 2021-07-06 ENCOUNTER — Telehealth: Payer: Self-pay | Admitting: Nurse Practitioner

## 2021-07-06 NOTE — Telephone Encounter (Signed)
Copied from CRM [id number]. Topic: Appointment Scheduling - Scheduling Inquiry for Clinic >> Jul 06, 2021  8:41 AM [name] A wrote: Reason for CRM: The patient would like to be seen for financial counseling   The patient would like to further discuss services available to help with the cost of their medical care  Please contact further when possible

## 2021-07-07 ENCOUNTER — Other Ambulatory Visit: Payer: Self-pay

## 2021-07-10 ENCOUNTER — Other Ambulatory Visit: Payer: Self-pay

## 2021-07-11 ENCOUNTER — Other Ambulatory Visit: Payer: Self-pay

## 2021-07-11 NOTE — Telephone Encounter (Signed)
I return Pt call, LVM to call me back since according to our record he has BC/BS until 07/08/21 and the next provider appt is until 05/23, by [facility] regulation is too far out to submit an application for financial program

## 2021-07-14 ENCOUNTER — Ambulatory Visit: Payer: BC Managed Care – PPO

## 2021-07-17 ENCOUNTER — Ambulatory Visit: Payer: Self-pay

## 2021-07-17 ENCOUNTER — Other Ambulatory Visit: Payer: Self-pay

## 2021-07-26 ENCOUNTER — Other Ambulatory Visit: Payer: Self-pay

## 2021-07-26 ENCOUNTER — Encounter: Payer: Self-pay | Admitting: Physician Assistant

## 2021-07-26 ENCOUNTER — Ambulatory Visit: Payer: Self-pay | Attending: Nurse Practitioner | Admitting: Physician Assistant

## 2021-07-26 VITALS — BP 185/100 | HR 82 | Resp 16 | Wt 180.6 lb

## 2021-07-26 DIAGNOSIS — I10 Essential (primary) hypertension: Secondary | ICD-10-CM

## 2021-07-26 DIAGNOSIS — E785 Hyperlipidemia, unspecified: Secondary | ICD-10-CM

## 2021-07-26 DIAGNOSIS — E108 Type 1 diabetes mellitus with unspecified complications: Secondary | ICD-10-CM

## 2021-07-26 LAB — POCT GLYCOSYLATED HEMOGLOBIN (HGB A1C): HbA1c, POC (controlled diabetic range): 9.4 % — AB (ref 0.0–7.0)

## 2021-07-26 LAB — GLUCOSE, POCT (MANUAL RESULT ENTRY): POC Glucose: 290 mg/dl — AB (ref 70–99)

## 2021-07-26 MED ORDER — GLUCOSE BLOOD VI STRP
ORAL_STRIP | 0 refills | Status: DC
  Filled 2021-07-26: qty 100, 25d supply, fill #0

## 2021-07-26 MED ORDER — NOVOLOG FLEXPEN 100 UNIT/ML ~~LOC~~ SOPN
PEN_INJECTOR | SUBCUTANEOUS | 0 refills | Status: DC
  Filled 2021-07-26: qty 9, 25d supply, fill #0
  Filled 2021-09-21 – 2021-09-22 (×2): qty 6, 16d supply, fill #1

## 2021-07-26 MED ORDER — BENAZEPRIL HCL 40 MG PO TABS
40.0000 mg | ORAL_TABLET | Freq: Every day | ORAL | 3 refills | Status: DC
  Filled 2021-07-26: qty 30, 30d supply, fill #0

## 2021-07-26 MED ORDER — BD PEN NEEDLE MINI U/F 31G X 5 MM MISC
1 refills | Status: DC
  Filled 2021-07-26: qty 100, 25d supply, fill #0

## 2021-07-26 MED ORDER — SIMVASTATIN 20 MG PO TABS
20.0000 mg | ORAL_TABLET | Freq: Every day | ORAL | 3 refills | Status: DC
  Filled 2021-07-26: qty 30, 30d supply, fill #0

## 2021-07-26 MED ORDER — INSULIN GLARGINE-YFGN 100 UNIT/ML ~~LOC~~ SOPN
17.0000 [IU] | PEN_INJECTOR | Freq: Two times a day (BID) | SUBCUTANEOUS | 2 refills | Status: DC
  Filled 2021-07-26 – 2021-08-25 (×2): qty 15, 44d supply, fill #0

## 2021-07-26 MED ORDER — TRUEPLUS LANCETS 28G MISC
1.0000 | Freq: Three times a day (TID) | 11 refills | Status: DC
  Filled 2021-07-26: qty 100, 33d supply, fill #0

## 2021-07-26 NOTE — Progress Notes (Signed)
290

## 2021-07-26 NOTE — Patient Instructions (Signed)
Diabetes Mellitus and Nutrition, Adult °When you have diabetes, or diabetes mellitus, it is very important to have healthy eating habits because your blood sugar (glucose) levels are greatly affected by what you eat and drink. Eating healthy foods in the right amounts, at about the same times every day, can help you: °Manage your blood glucose. °Lower your risk of heart disease. °Improve your blood pressure. °Reach or maintain a healthy weight. °What can affect my meal plan? °Every person with diabetes is different, and each person has different needs for a meal plan. Your health care provider may recommend that you work with a dietitian to make a meal plan that is best for you. Your meal plan may vary depending on factors such as: °The calories you need. °The medicines you take. °Your weight. °Your blood glucose, blood pressure, and cholesterol levels. °Your activity level. °Other health conditions you have, such as heart or kidney disease. °How do carbohydrates affect me? °Carbohydrates, also called carbs, affect your blood glucose level more than any other type of food. Eating carbs raises the amount of glucose in your blood. °It is important to know how many carbs you can safely have in each meal. This is different for every person. Your dietitian can help you calculate how many carbs you should have at each meal and for each snack. °How does alcohol affect me? °Alcohol can cause a decrease in blood glucose (hypoglycemia), especially if you use insulin or take certain diabetes medicines by mouth. Hypoglycemia can be a life-threatening condition. Symptoms of hypoglycemia, such as sleepiness, dizziness, and confusion, are similar to symptoms of having too much alcohol. °Do not drink alcohol if: °Your health care provider tells you not to drink. °You are pregnant, may be pregnant, or are planning to become pregnant. °If you drink alcohol: °Limit how much you have to: °0-1 drink a day for women. °0-2 drinks a day  for men. °Know how much alcohol is in your drink. In the [place] one drink equals one 12 oz bottle of beer (355 mL), one 5 oz glass of wine (148 mL), or one 1½ oz glass of hard liquor (44 mL). °Keep yourself hydrated with water, diet soda, or unsweetened iced tea. Keep in mind that regular soda, juice, and other mixers may contain a lot of sugar and must be counted as carbs. °What are tips for following this plan? °Reading food labels °Start by checking the serving size on the Nutrition Facts label of packaged foods and drinks. The number of calories and the amount of carbs, fats, and other nutrients listed on the label are based on one serving of the item. Many items contain more than one serving per package. °Check the total grams (g) of carbs in one serving. °Check the number of grams of saturated fats and trans fats in one serving. Choose foods that have a low amount or none of these fats. °Check the number of milligrams (mg) of salt (sodium) in one serving. Most people should limit total sodium intake to less than 2,300 mg per day. °Always check the nutrition information of foods labeled as "low-fat" or "nonfat." These foods may be higher in added sugar or refined carbs and should be avoided. °Talk to your dietitian to identify your daily goals for nutrients listed on the label. °Shopping °Avoid buying canned, pre-made, or processed foods. These foods tend to be high in fat, sodium, and added sugar. °Shop around the outside edge of the grocery store. This is where you will   most often find fresh fruits and vegetables, bulk grains, fresh meats, and fresh dairy products. Cooking Use low-heat cooking methods, such as baking, instead of high-heat cooking methods, such as deep frying. Cook using healthy oils, such as olive, canola, or sunflower oil. Avoid cooking with butter, cream, or high-fat meats. Meal planning Eat meals and snacks regularly, preferably at the same times every day. Avoid going long periods of  time without eating. Eat foods that are high in fiber, such as fresh fruits, vegetables, beans, and whole grains. Eat 4-6 oz (112-168 g) of lean protein each day, such as lean meat, chicken, fish, eggs, or tofu. One ounce (oz) (28 g) of lean protein is equal to: 1 oz (28 g) of meat, chicken, or fish. 1 egg.  cup (62 g) of tofu. Eat some foods each day that contain healthy fats, such as avocado, nuts, seeds, and fish. What foods should I eat? Fruits Berries. Apples. Oranges. Peaches. Apricots. Plums. Grapes. Mangoes. Papayas. Pomegranates. Kiwi. Cherries. Vegetables Leafy greens, including lettuce, spinach, kale, chard, collard greens, mustard greens, and cabbage. Beets. Cauliflower. Broccoli. Carrots. Green beans. Tomatoes. Peppers. Onions. Cucumbers. Brussels sprouts. Grains Whole grains, such as whole-wheat or whole-grain bread, crackers, tortillas, cereal, and pasta. Unsweetened oatmeal. Quinoa. Brown or wild rice. Meats and other proteins Seafood. Poultry without skin. Lean cuts of poultry and beef. Tofu. Nuts. Seeds. Dairy Low-fat or fat-free dairy products such as milk, yogurt, and cheese. The items listed above may not be a complete list of foods and beverages you can eat and drink. Contact a dietitian for more information. What foods should I avoid? Fruits Fruits canned with syrup. Vegetables Canned vegetables. Frozen vegetables with butter or cream sauce. Grains Refined white flour and flour products such as bread, pasta, snack foods, and cereals. Avoid all processed foods. Meats and other proteins Fatty cuts of meat. Poultry with skin. Breaded or fried meats. Processed meat. Avoid saturated fats. Dairy Full-fat yogurt, cheese, or milk. Beverages Sweetened drinks, such as soda or iced tea. The items listed above may not be a complete list of foods and beverages you should avoid. Contact a dietitian for more information. Questions to ask a health care provider Do I need to  meet with a certified diabetes care and education specialist? Do I need to meet with a dietitian? What number can I call if I have questions? When are the best times to check my blood glucose? Where to find more information: American Diabetes Association: diabetes.org Academy of Nutrition and Dietetics: eatright.[employer] of Diabetes and Digestive and Kidney Diseases: [employer] Association of Diabetes Care & Education Specialists: diabeteseducator.org Summary It is important to have healthy eating habits because your blood sugar (glucose) levels are greatly affected by what you eat and drink. It is important to use alcohol carefully. A healthy meal plan will help you manage your blood glucose and lower your risk of heart disease. Your health care provider may recommend that you work with a dietitian to make a meal plan that is best for you. This information is not intended to replace advice given to you by your health care provider. Make sure you discuss any questions you have with your health care provider. Document Revised: 01/27/2020 Document Reviewed: 01/27/2020 Elsevier Patient Education  [place]. Hypertension, Adult High blood pressure (hypertension) is when the force of blood pumping through the arteries is too strong. The arteries are the blood vessels that carry blood from the heart throughout the body. Hypertension forces the  heart to work harder to pump blood and may cause arteries to become narrow or stiff. Untreated or uncontrolled hypertension can cause a heart attack, heart failure, a stroke, kidney disease, and other problems. A blood pressure reading consists of a higher number over a lower number. Ideally, your blood pressure should be below 120/80. The first ("top") number is called the systolic pressure. It is a measure of the pressure in your arteries as your heart beats. The second ("bottom") number is called the diastolic pressure. It is a measure  of the pressure in your arteries as the heart relaxes. What are the causes? The exact cause of this condition is not known. There are some conditions that result in or are related to high blood pressure. What increases the risk? Some risk factors for high blood pressure are under your control. The following factors may make you more likely to develop this condition: Smoking. Having type 2 diabetes mellitus, high cholesterol, or both. Not getting enough exercise or physical activity. Being overweight. Having too much fat, sugar, calories, or salt (sodium) in your diet. Drinking too much alcohol. Some risk factors for high blood pressure may be difficult or impossible to change. Some of these factors include: Having chronic kidney disease. Having a family history of high blood pressure. Age. Risk increases with age. Race. You may be at higher risk if you are African American. Gender. Men are at higher risk than women before age 17. After age 50, women are at higher risk than men. Having obstructive sleep apnea. Stress. What are the signs or symptoms? High blood pressure may not cause symptoms. Very high blood pressure (hypertensive crisis) may cause: Headache. Anxiety. Shortness of breath. Nosebleed. Nausea and vomiting. Vision changes. Severe chest pain. Seizures. How is this diagnosed? This condition is diagnosed by measuring your blood pressure while you are seated, with your arm resting on a flat surface, your legs uncrossed, and your feet flat on the floor. The cuff of the blood pressure monitor will be placed directly against the skin of your upper arm at the level of your heart. It should be measured at least twice using the same arm. Certain conditions can cause a difference in blood pressure between your right and left arms. Certain factors can cause blood pressure readings to be lower or higher than normal for a short period of time: When your blood pressure is higher when you  are in a health care provider's office than when you are at home, this is called white coat hypertension. Most people with this condition do not need medicines. When your blood pressure is higher at home than when you are in a health care provider's office, this is called masked hypertension. Most people with this condition may need medicines to control blood pressure. If you have a high blood pressure reading during one visit or you have normal blood pressure with other risk factors, you may be asked to: Return on a different day to have your blood pressure checked again. Monitor your blood pressure at home for 1 week or longer. If you are diagnosed with hypertension, you may have other blood or imaging tests to help your health care provider understand your overall risk for other conditions. How is this treated? This condition is treated by making healthy lifestyle changes, such as eating healthy foods, exercising more, and reducing your alcohol intake. Your health care provider may prescribe medicine if lifestyle changes are not enough to get your blood pressure under control, and  if: Your systolic blood pressure is above 130. Your diastolic blood pressure is above 80. Your personal target blood pressure may vary depending on your medical conditions, your age, and other factors. Follow these instructions at home: Eating and drinking  Eat a diet that is high in fiber and potassium, and low in sodium, added sugar, and fat. An example eating plan is called the DASH (Dietary Approaches to Stop Hypertension) diet. To eat this way: Eat plenty of fresh fruits and vegetables. Try to fill one half of your plate at each meal with fruits and vegetables. Eat whole grains, such as whole-wheat pasta, brown rice, or whole-grain bread. Fill about one fourth of your plate with whole grains. Eat or drink low-fat dairy products, such as skim milk or low-fat yogurt. Avoid fatty cuts of meat, processed or cured  meats, and poultry with skin. Fill about one fourth of your plate with lean proteins, such as fish, chicken without skin, beans, eggs, or tofu. Avoid pre-made and processed foods. These tend to be higher in sodium, added sugar, and fat. Reduce your daily sodium intake. Most people with hypertension should eat less than 1,500 mg of sodium a day. Do not drink alcohol if: Your health care provider tells you not to drink. You are pregnant, may be pregnant, or are planning to become pregnant. If you drink alcohol: Limit how much you use to: 0-1 drink a day for women. 0-2 drinks a day for men. Be aware of how much alcohol is in your drink. In the [place] one drink equals one 12 oz bottle of beer (355 mL), one 5 oz glass of wine (148 mL), or one 1 oz glass of hard liquor (44 mL). Lifestyle  Work with your health care provider to maintain a healthy body weight or to lose weight. Ask what an ideal weight is for you. Get at least 30 minutes of exercise most days of the week. Activities may include walking, swimming, or biking. Include exercise to strengthen your muscles (resistance exercise), such as Pilates or lifting weights, as part of your weekly exercise routine. Try to do these types of exercises for 30 minutes at least 3 days a week. Do not use any products that contain nicotine or tobacco, such as cigarettes, e-cigarettes, and chewing tobacco. If you need help quitting, ask your health care provider. Monitor your blood pressure at home as told by your health care provider. Keep all follow-up visits as told by your health care provider. This is important. Medicines Take over-the-counter and prescription medicines only as told by your health care provider. Follow directions carefully. Blood pressure medicines must be taken as prescribed. Do not skip doses of blood pressure medicine. Doing this puts you at risk for problems and can make the medicine less effective. Ask your health care provider about  side effects or reactions to medicines that you should watch for. Contact a health care provider if you: Think you are having a reaction to a medicine you are taking. Have headaches that keep coming back (recurring). Feel dizzy. Have swelling in your ankles. Have trouble with your vision. Get help right away if you: Develop a severe headache or confusion. Have unusual weakness or numbness. Feel faint. Have severe pain in your chest or abdomen. Vomit repeatedly. Have trouble breathing. Summary Hypertension is when the force of blood pumping through your arteries is too strong. If this condition is not controlled, it may put you at risk for serious complications. Your personal target blood pressure  may vary depending on your medical conditions, your age, and other factors. For most people, a normal blood pressure is less than 120/80. Hypertension is treated with lifestyle changes, medicines, or a combination of both. Lifestyle changes include losing weight, eating a healthy, low-sodium diet, exercising more, and limiting alcohol. This information is not intended to replace advice given to you by your health care provider. Make sure you discuss any questions you have with your health care provider. Document Revised: 03/05/2018 Document Reviewed: 03/05/2018 Elsevier Patient Education  [place].

## 2021-07-26 NOTE — Progress Notes (Signed)
Patient ID: [name], male   DOB: March 08, 1976, 46 y.o.   MRN: [id number]   [name], is a 46 y.o. male  XBL:[id number]  RAQ:[id number]  DOB - 09/30/1975  Chief Complaint  Patient presents with   Medication Refill       Subjective:   [name] is a 46 y.o. male here today for a follow up visit on DM.  Not checking blood sugars or using sliding scale bc out of strips/lancets.  He is taking 14 units semglee bid.  Not using SS.  Not taking Benzapril which he was supposed to start on 05/23/2021 when he was having trouble with Diovan.  So, he has not been taking any BP meds   Patient has No headache, No chest pain, No abdominal pain - No Nausea, No new weakness tingling or numbness, No Cough - SOB.  No problems updated.  ALLERGIES: Allergies  Allergen Reactions   Geodon [Ziprasidone Hcl] Other (See Comments)    Severe muscle spasms (ENTIRE BODY)- had to call EMS   Ziprasidone Mesylate Other (See Comments)    "severe muscle spasms"- Had to call EMS    PAST MEDICAL HISTORY: Past Medical History:  Diagnosis Date   Diabetes mellitus    Hypertension    Seizures (HCC)     MEDICATIONS AT HOME: Prior to Admission medications   Medication Sig Start Date End Date Taking? Authorizing Provider  benazepril (LOTENSIN) 40 MG tablet Take 1 tablet (40 mg total) by mouth daily. 07/26/21   [name], PA-C  divalproex (DEPAKOTE ER) 500 MG 24 hr tablet Take 3 tablets (1,500 mg total) by mouth daily. 03/10/21 06/08/21  Hoy Register, MD  glucose blood test strip Use as instructed. MUST MAKE APPT FOR FURTHER REFILLS 07/26/21   [name], PA-C  ibuprofen (ADVIL) 800 MG tablet Take 1 tablet (800 mg total) by mouth every 8 (eight) hours as needed. 02/22/21   Claiborne Rigg, NP  insulin aspart (NOVOLOG FLEXPEN) 100 UNIT/ML FlexPen Monitor blood glucose 3 times daily. For blood sugars 0-150 give 0 units of insulin, 151-200 give 2 units of insulin, 201-250 give 4 units, 251-300 give 6  units, 301-350 give 8 units, 351-400 give 10 units,> 400 give 12 units and call MD 07/26/21   [name], PA-C  insulin glargine-yfgn (SEMGLEE, YFGN,) 100 UNIT/ML Pen Inject 17 Units into the skin 2 (two) times daily. 07/26/21   [name], PA-C  Insulin Pen Needle (B-D UF III MINI PEN NEEDLES) 31G X 5 MM MISC Insert into the skin as prescribed 07/26/21   [name], PA-C  methocarbamol (ROBAXIN) 500 MG tablet Take 1 tablet (500 mg total) by mouth every 8 (eight) hours as needed for muscle spasms. 02/22/21   Claiborne Rigg, NP  simvastatin (ZOCOR) 20 MG tablet Take 1 tablet (20 mg total) by mouth at bedtime. 07/26/21 11/23/21  [name], PA-C  TRUEplus Lancets 28G MISC 1 each by Does not apply route 3 (three) times daily. 07/26/21   [name], PA-C  insulin glargine (LANTUS) 100 UNIT/ML Solostar Pen INJECT 17 UNITS INTO THE SKIN TWICE DAILY Patient taking differently: INJECT 18 UNITS INTO THE SKIN TWICE DAILY 08/16/20 12/15/20  Claiborne Rigg, NP  insulin lispro (HUMALOG KWIKPEN) 100 UNIT/ML KwikPen Monitor blood glucose TID. For blood sugars 0-150 give 0 units of insulin, 151-200 give 2 units of insulin, 201-250 give 4 units, 251-300 give 6 units, 301-350 give 8 units, 351-400 give  10 units,> 400 give 12 units and call M.D. Discussed hypoglycemia protocol. 03/22/20 04/08/20  Claiborne RiggFleming, Zelda W, NP    ROS: Neg HEENT Neg resp Neg cardiac Neg GI Neg GU Neg MS Neg psych Neg neuro  Objective:   Vitals:   07/26/21 1349  BP: (!) 185/100  Pulse: 82  Resp: 16  SpO2: 98%  Weight: 180 lb 9.6 oz (81.9 kg)   Exam General appearance : Awake, alert, not in any distress. Speech Clear. Not toxic looking;  seems a little slow to answer.  Difficult to obtain history HEENT: Atraumatic and Normocephalic Neck: Supple, no JVD. No cervical lymphadenopathy.  Chest: Good air entry bilaterally, CTAB.  No rales/rhonchi/wheezing CVS: S1 S2 regular, no murmurs.  Abdomen: Bowel  sounds present, Non tender and not distended with no gaurding, rigidity or rebound. Extremities: B/L Lower Ext shows no edema, both legs are warm to touch Neurology: Awake alert, and oriented X 3, CN II-XII intact, Non focal Skin: No Rash  Data Review Lab Results  Component Value Date   HGBA1C 9.4 (A) 07/26/2021   HGBA1C 10.2 (H) 03/01/2021   HGBA1C 8.8 (A) 09/28/2020    Assessment & Plan   1. DM (diabetes mellitus), type 1 with complications (HCC) Improved but not controlled.  Increase semglee form 14 u bid to 17 unites bid.  Resume glucose testing and SS.  Follow up with Franky MachoLuke in 3 weeks - Glucose (CBG) - HgB A1c - glucose blood test strip; Use as instructed. MUST MAKE APPT FOR FURTHER REFILLS  Dispense: 100 each; Refill: 0 - TRUEplus Lancets 28G MISC; 1 each by Does not apply route 3 (three) times daily.  Dispense: 100 each; Refill: 11 - insulin glargine-yfgn (SEMGLEE, YFGN,) 100 UNIT/ML Pen; Inject 17 Units into the skin 2 (two) times daily.  Dispense: 15 mL; Refill: 2 - insulin aspart (NOVOLOG FLEXPEN) 100 UNIT/ML FlexPen; Monitor blood glucose 3 times daily. For blood sugars 0-150 give 0 units of insulin, 151-200 give 2 units of insulin, 201-250 give 4 units, 251-300 give 6 units, 301-350 give 8 units, 351-400 give 10 units,> 400 give 12 units and call MD  Dispense: 15 mL; Refill: 0 - simvastatin (ZOCOR) 20 MG tablet; Take 1 tablet (20 mg total) by mouth at bedtime.  Dispense: 30 tablet; Refill: 3 - Insulin Pen Needle (B-D UF III MINI PEN NEEDLES) 31G X 5 MM MISC; Insert into the skin as prescribed  Dispense: 90 each; Refill: 1 -cmp in 3 weeks -lipids in 3 weeks  2. Essential hypertension, benign Start Benzapril(he was supposed to start this in Nov 2022)  check BP OOO 3-4 times weekly and record and bring to f/up with Franky MachoLuke - benazepril (LOTENSIN) 40 MG tablet; Take 1 tablet (40 mg total) by mouth daily.  Dispense: 90 tablet; Refill: 3  3. Hyperlipidemia, unspecified  hyperlipidemia type continue - simvastatin (ZOCOR) 20 MG tablet; Take 1 tablet (20 mg total) by mouth at bedtime.  Dispense: 30 tablet; Refill: 3  Check labs at f/up with luke  Patient have been counseled extensively about nutrition and exercise. Other issues discussed during this visit include: low cholesterol diet, weight control and daily exercise, foot care, annual eye examinations at Ophthalmology, importance of adherence with medications and regular follow-up. We also discussed long term complications of uncontrolled diabetes and hypertension.   Return in about 3 months (around 10/24/2021) for 3 weeks for BP and DM check and 3 months PCP/chronic conditions.  The patient was given clear instructions to go  to ER or return to medical center if symptoms don't improve, worsen or new problems develop. The patient verbalized understanding. The patient was told to call to get lab results if they haven't heard anything in the next week.      Georgian Co, PA-C [facility] [place], [place] [phone]   07/26/2021, 2:00 PM

## 2021-07-27 ENCOUNTER — Other Ambulatory Visit: Payer: Self-pay

## 2021-07-27 ENCOUNTER — Telehealth (INDEPENDENT_AMBULATORY_CARE_PROVIDER_SITE_OTHER): Payer: Self-pay | Admitting: Clinical

## 2021-07-27 NOTE — Telephone Encounter (Signed)
I attempted to call pt per his request, no answer, left vm.    "Patient was seen earlier and requested to speak with you. I let him know that you were with a patient and he said if you could give him a call please"

## 2021-07-31 ENCOUNTER — Other Ambulatory Visit: Payer: Self-pay

## 2021-07-31 ENCOUNTER — Other Ambulatory Visit: Payer: Self-pay | Admitting: Family Medicine

## 2021-07-31 DIAGNOSIS — G40309 Generalized idiopathic epilepsy and epileptic syndromes, not intractable, without status epilepticus: Secondary | ICD-10-CM

## 2021-07-31 NOTE — Telephone Encounter (Signed)
Requested medication (s) are due for refill today:   Provider to review  Requested medication (s) are on the active medication list:   Yes  Future visit scheduled:   No.  Seen 5 days ago   Last ordered: 03/10/2021 #90, 2 refills  Non delegated refill   Requested Prescriptions  Pending Prescriptions Disp Refills   divalproex (DEPAKOTE ER) 500 MG 24 hr tablet 90 tablet 2    Sig: Take 3 tablets (1,500 mg total) by mouth daily.     Not Delegated - Neurology:  Anticonvulsants - Valproates Failed - 07/31/2021  8:54 AM      Failed - This refill cannot be delegated      Failed - HGB in normal range and within 360 days    Hemoglobin  Date Value Ref Range Status  09/28/2020 12.8 (L) 13.0 - 17.7 g/dL Final          Failed - Valproic Acid (serum) in normal range and within 360 days    Valproic Acid Lvl  Date Value Ref Range Status  04/02/2019 63 50.0 - 100.0 ug/mL Final    Comment:    Performed at [facility], [place] [address] [place], [place] [address]          Passed - AST in normal range and within 360 days    AST  Date Value Ref Range Status  03/01/2021 21 0 - 40 IU/L Final          Passed - ALT in normal range and within 360 days    ALT  Date Value Ref Range Status  03/01/2021 13 0 - 44 IU/L Final          Passed - PLT in normal range and within 360 days    Platelets  Date Value Ref Range Status  09/28/2020 174 150 - 450 x10E3/uL Final          Passed - WBC in normal range and within 360 days    WBC  Date Value Ref Range Status  09/28/2020 5.5 3.4 - 10.8 x10E3/uL Final  04/03/2019 5.9 4.0 - 10.5 K/uL Final          Passed - HCT in normal range and within 360 days    Hematocrit  Date Value Ref Range Status  09/28/2020 38.4 37.5 - 51.0 % Final          Passed - Valid encounter within last 12 months    Recent Outpatient Visits           5 days ago DM (diabetes mellitus), type 1 with complications [facility])   [place], [place], [place]   2 months ago Skin infection   [place] Elsie Stain, MD   2 months ago Chronic bilateral low back pain without sciatica   [place], [place] W, NP   4 months ago DM (diabetes mellitus), type 1 with complications [facility])   [place], [place] W, NP   5 months ago Acute bilateral low back pain without sciatica   [place] Luke, Vernia Buff, NP

## 2021-08-02 MED ORDER — DIVALPROEX SODIUM ER 500 MG PO TB24
1500.0000 mg | ORAL_TABLET | Freq: Every day | ORAL | 2 refills | Status: DC
  Filled 2021-08-02: qty 90, 30d supply, fill #0

## 2021-08-02 NOTE — Telephone Encounter (Signed)
Pt called to speak with [facility] to see if he can be helped with housing / please advise asap

## 2021-08-03 ENCOUNTER — Ambulatory Visit: Payer: Self-pay | Admitting: Physician Assistant

## 2021-08-03 ENCOUNTER — Other Ambulatory Visit: Payer: Self-pay

## 2021-08-18 ENCOUNTER — Encounter (INDEPENDENT_AMBULATORY_CARE_PROVIDER_SITE_OTHER): Payer: Self-pay

## 2021-08-25 ENCOUNTER — Other Ambulatory Visit: Payer: Self-pay

## 2021-08-25 ENCOUNTER — Other Ambulatory Visit: Payer: Self-pay | Admitting: Pharmacist

## 2021-08-25 MED ORDER — BASAGLAR KWIKPEN 100 UNIT/ML ~~LOC~~ SOPN
17.0000 [IU] | PEN_INJECTOR | Freq: Two times a day (BID) | SUBCUTANEOUS | 2 refills | Status: DC
  Filled 2021-08-25: qty 9, 26d supply, fill #0
  Filled 2021-09-21 – 2021-09-22 (×2): qty 9, 26d supply, fill #1
  Filled 2021-10-17: qty 9, 26d supply, fill #2
  Filled 2021-11-15 – 2021-11-24 (×2): qty 9, 26d supply, fill #3

## 2021-08-28 ENCOUNTER — Other Ambulatory Visit: Payer: Self-pay

## 2021-08-29 ENCOUNTER — Ambulatory Visit: Payer: BC Managed Care – PPO | Admitting: Neurology

## 2021-09-21 ENCOUNTER — Other Ambulatory Visit: Payer: Self-pay

## 2021-09-22 ENCOUNTER — Other Ambulatory Visit: Payer: Self-pay

## 2021-09-22 ENCOUNTER — Other Ambulatory Visit (HOSPITAL_COMMUNITY): Payer: Self-pay

## 2021-09-27 ENCOUNTER — Other Ambulatory Visit: Payer: Self-pay

## 2021-10-04 ENCOUNTER — Telehealth: Payer: Self-pay | Admitting: Clinical

## 2021-10-04 NOTE — Telephone Encounter (Signed)
Pt called LCSW after receiving a mychart message to return call. He stated that he is currently sleeping in his pastor's home. Reports that he has been having trouble maintaining employment. Reports that he is connected with [employer] to assist with employment. Reports that he has difficulty with managing his anger. LCSW scheduled an appt with pt for 10/23/21.  ?

## 2021-10-06 ENCOUNTER — Ambulatory Visit: Payer: Self-pay | Attending: Internal Medicine | Admitting: Internal Medicine

## 2021-10-06 ENCOUNTER — Encounter: Payer: Self-pay | Admitting: Internal Medicine

## 2021-10-06 VITALS — BP 140/75 | HR 82 | Resp 16 | Wt 187.0 lb

## 2021-10-06 DIAGNOSIS — I10 Essential (primary) hypertension: Secondary | ICD-10-CM

## 2021-10-06 DIAGNOSIS — R251 Tremor, unspecified: Secondary | ICD-10-CM

## 2021-10-06 DIAGNOSIS — Z8669 Personal history of other diseases of the nervous system and sense organs: Secondary | ICD-10-CM

## 2021-10-06 NOTE — Progress Notes (Addendum)
? ? ?Patient ID: [name], male    DOB: March 18, 1976  MRN: [id number] ? ?CC: shaking hands (Pt states it has been going on for months ) ? ? ?Subjective: ?[name] is a 46 y.o. male who presents for UC visit.  Male friend is with him. ?His concerns today include:  ?Pt with DM, HTN, sz ? ?Patient complains of shaking both hands x 4 mths ?Constant but intensity fluctuates ?Tremor is most noticeable with action like if he is holding a cup. ?Not as much at rest ?No numbness in the hands. ?Hx of sz.  Last sz was yrs ago.  He is on Depakote and taking it consistently.  No family history of movement disorders.   ? ?HTN: Blood pressure noted to be elevated today.  He is on benazepril and takes it consistently.  Checks his blood pressure sometimes.  Last checked about 2 weeks ago and it was 130/90.  He has not seen his PCP in a while.   ?Patient Active Problem List  ? Diagnosis Date Noted  ? CKD (chronic kidney disease), stage III ([place]) 04/02/2019  ? Prolonged QT interval 04/02/2019  ? 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 Knee pain 03/16/2016  ? Scrotal pain 01/13/2016  ? Seizure disorder ([place]) 03/10/2015  ? Type 1 diabetes mellitus with hyperlipidemia ([place]) 03/10/2015  ? DIABETIC PERIPHERAL NEUROPATHY 07/10/2010  ? UNSPECIFIED VITAMIN D DEFICIENCY 06/16/2009  ? PTSD (post-traumatic stress disorder) 03/26/2008  ? Essential hypertension, benign 03/24/2008  ? SLEEP DISORDER 03/24/2008  ? SLEEP DEPRIVATION 02/09/2008  ? DM (diabetes mellitus), type 1 with complications ([place]) [id number]  ?  ? ?Current Outpatient Medications on File Prior to Visit  ?Medication Sig Dispense Refill  ? divalproex (DEPAKOTE ER) 500 MG 24 hr tablet Take 3 tablets (1,500 mg total) by mouth daily. 90 tablet 2  ? benazepril (LOTENSIN) 40 MG tablet Take 1 tablet (40 mg total) by mouth daily. 90 tablet 3  ? glucose blood test strip Use as instructed. MUST MAKE APPT FOR FURTHER REFILLS 100 each 0  ? ibuprofen (ADVIL) 800  MG tablet Take 1 tablet (800 mg total) by mouth every 8 (eight) hours as needed. 60 tablet 1  ? insulin aspart (NOVOLOG FLEXPEN) 100 UNIT/ML FlexPen Monitor blood glucose 3 times daily. For blood sugars 0-150 give 0 units of insulin, 151-200 give 2 units of insulin, 201-250 give 4 units, 251-300 give 6 units, 301-350 give 8 units, 351-400 give 10 units,> 400 give 12 units and call MD 15 mL 0  ? Insulin Glargine (BASAGLAR KWIKPEN) 100 UNIT/ML Inject 17 Units into the skin 2 (two) times daily. 15 mL 2  ? Insulin Pen Needle (B-D UF III MINI PEN NEEDLES) 31G X 5 MM MISC Insert into the skin as prescribed 90 each 1  ? methocarbamol (ROBAXIN) 500 MG tablet Take 1 tablet (500 mg total) by mouth every 8 (eight) hours as needed for muscle spasms. 60 tablet 2  ? simvastatin (ZOCOR) 20 MG tablet Take 1 tablet (20 mg total) by mouth at bedtime. 30 tablet 3  ? TRUEplus Lancets 28G MISC use as directed 3 (three) times daily. 100 each 11  ? [DISCONTINUED] insulin lispro (HUMALOG KWIKPEN) 100 UNIT/ML KwikPen Monitor blood glucose TID. For blood sugars 0-150 give 0 units of insulin, 151-200 give 2 units of insulin, 201-250 give 4 units, 251-300 give 6 units, 301-350 give 8 units, 351-400 give 10 units,> 400 give 12 units and call M.D. Discussed  hypoglycemia protocol. 15 mL 6  ? ?No current facility-administered medications on file prior to visit.  ? ? ?Allergies  ?Allergen Reactions  ? Geodon [Ziprasidone Hcl] Other (See Comments)  ?  Severe muscle spasms (ENTIRE BODY)- had to call EMS  ? Ziprasidone Mesylate Other (See Comments)  ?  "severe muscle spasms"- Had to call EMS  ? ? ?Social History  ? ?Socioeconomic History  ? Marital status: Single  ?  Spouse name: Not on file  ? Number of children: 3  ? Years of education: Not on file  ? Highest education level: Some college, no degree  ?Occupational History  ? Not on file  ?Tobacco Use  ? Smoking status: Never  ? Smokeless tobacco: Never  ?Vaping Use  ? Vaping Use: Never used   ?Substance and Sexual Activity  ? Alcohol use: Not Currently  ?  Alcohol/week: 1.0 standard drink  ?  Types: 1 Glasses of wine per week  ?  Comment: rarely  ? Drug use: No  ? Sexual activity: Not Currently  ?Other Topics Concern  ? Not on file  ?Social History Narrative  ? Pt is left handed  ? Lives alone in 2 story home  ? Has 3 children  ? Some college education  ? Worked as Equities trader  ? ?Social Determinants of Health  ? ?Financial Resource Strain: Not on file  ?Food Insecurity: Not on file  ?Transportation Needs: Not on file  ?Physical Activity: Not on file  ?Stress: Not on file  ?Social Connections: Not on file  ?Intimate Partner Violence: Not on file  ? ? ?Family History  ?Problem Relation Age of Onset  ? Hypertension Mother   ? Diabetes Paternal Uncle   ? ? ?Past Surgical History:  ?Procedure Laterality Date  ? HERNIA REPAIR    ? ? ?ROS: ?Review of Systems ?Negative except as stated above ? ?PHYSICAL EXAM: ?BP 140/75   Pulse 82   Resp 16   Wt 187 lb (84.8 kg)   SpO2 100%   BMI 26.08 kg/m?   ?Physical Exam ?140/75 ? ?General appearance - alert, well appearing, young to middle-aged African-American male and in no distress ?Mental status - normal mood, behavior, speech, dress, motor activity, and thought processes ?Neurological -cranial nerves grossly intact.  No nystagmus. ?Power in upper and lower extremities 5/5 bilaterally. ?Patient with fine tremor of the hands noted when action and becomes more coarse towards the end of the movement.  Noted when pt was asked to reach for an object that I was holding in my hands.   ? ? ?  Latest Ref Rng & Units 03/01/2021  ?  9:50 AM 09/28/2020  ?  4:06 PM 02/26/2020  ?  4:47 PM  ?CMP  ?Glucose 65 - 99 mg/dL 273   109   568    ?BUN 6 - 24 mg/dL 22   18   18     ?Creatinine 0.76 - 1.27 mg/dL 1.54   1.47   1.40    ?Sodium 134 - 144 mmol/L 141   141   134    ?Potassium 3.5 - 5.2 mmol/L 4.9   4.5   4.7    ?Chloride 96 - 106 mmol/L 102   104   98    ?CO2 20 - 29 mmol/L 25    24   24     ?Calcium 8.7 - 10.2 mg/dL 9.3   8.6   8.7    ?Total Protein 6.0 - 8.5 g/dL 6.5  5.7   6.3    ?Total Bilirubin 0.0 - 1.2 mg/dL 0.3   0.3   0.3    ?Alkaline Phos 44 - 121 IU/L 51   47   57    ?AST 0 - 40 IU/L 21   28   29     ?ALT 0 - 44 IU/L 13   18   14     ? ?Lipid Panel  ?   ?Component Value Date/Time  ? CHOL 240 (H) 03/01/2021 0950  ? TRIG 67 03/01/2021 0950  ? HDL 72 03/01/2021 0950  ? CHOLHDL 3.3 03/01/2021 0950  ? CHOLHDL 1.7 12/16/2015 1521  ? VLDL 6 12/16/2015 1521  ? LDLCALC 157 (H) 03/01/2021 0950  ? ? ?CBC ?   ?Component Value Date/Time  ? WBC 5.5 09/28/2020 1606  ? WBC 5.9 04/03/2019 0703  ? RBC 4.34 09/28/2020 1606  ? RBC 4.42 04/03/2019 0703  ? HGB 12.8 (L) 09/28/2020 1606  ? HCT 38.4 09/28/2020 1606  ? PLT 174 09/28/2020 1606  ? MCV 89 09/28/2020 1606  ? MCH 29.5 09/28/2020 1606  ? MCH 29.0 04/03/2019 0703  ? MCHC 33.3 09/28/2020 1606  ? MCHC 32.9 04/03/2019 0703  ? RDW 12.9 09/28/2020 1606  ? LYMPHSABS 1.2 04/02/2019 1010  ? LYMPHSABS 2.1 04/09/2018 0930  ? MONOABS 0.4 04/02/2019 1010  ? EOSABS 0.0 04/02/2019 1010  ? EOSABS 0.2 04/09/2018 0930  ? BASOSABS 0.0 04/02/2019 1010  ? BASOSABS 0.0 04/09/2018 0930  ? ? ?ASSESSMENT AND PLAN: ?1. Tremor of both hands ?This seems to be an intention tremor/essential tremor.  Because of this condition is unknown but sometimes can occur in other family members.  I told him that I would like to check some baseline blood test including thyroid level, chemistry and Depakote levels.  If these are okay, we can consider putting him on low-dose of propranolol or primidone.  Also suggest getting him in with his neurologist Dr. Delice Lesch.  He missed the appointment with her and states he was offered an appointment but it was months away and he declined the appointment ? ?- Ambulatory referral to Neurology ?- TSH ?- Comprehensive metabolic panel ? ?2. Essential hypertension ?Not at goal but improved compared to initial blood pressure check.  Advise goal of 130/80 or  lower.  Encouraged him to check blood pressure at least twice a week and record the readings.  Bring in his readings when he comes to see his PCP ? ?3. History of seizure disorder ?- Valproic acid level ? ?ADDE

## 2021-10-06 NOTE — Patient Instructions (Signed)
Essential Tremor °A tremor is trembling or shaking that a person cannot control. Most tremors affect the hands or arms. Tremors can also affect the head, vocal cords, legs, and other parts of the body. Essential tremor is a tremor without a known cause. Usually, it occurs while a person is trying to perform an action. It tends to get worse gradually as a person ages. °What are the causes? °The cause of this condition is not known. °What increases the risk? °You are more likely to develop this condition if: °You have a family member with essential tremor. °You are age 40 or older. °You take certain medicines. °What are the signs or symptoms? °The main sign of a tremor is a rhythmic shaking of certain parts of your body that is uncontrolled and unintentional. You may: °Have difficulty eating with a spoon or fork. °Have difficulty writing. °Nod your head up and down or side to side. °Have a quivering voice. °The shaking may: °Get worse over time. °Come and go. °Be more noticeable on one side of your body. °Get worse due to stress, fatigue, caffeine, and extreme heat or cold. °How is this diagnosed? °This condition may be diagnosed based on: °Your symptoms and medical history. °A physical exam. °There is no single test to diagnose an essential tremor. However, your health care provider may order tests to rule out other causes of your condition. These may include: °Blood and urine tests. °Imaging studies of your brain, such as CT scan and MRI. °A test that measures involuntary muscle movement (electromyogram). °How is this treated? °Treatment for essential tremor depends on the severity of the condition. °Some tremors may go away without treatment. °Mild tremors may not need treatment if they do not affect your day-to-day life. °Severe tremors may need to be treated using one or more of the following options: °Medicines. °Lifestyle changes. °Occupational or physical therapy. °Follow these instructions at  home: °Lifestyle ° °Do not use any products that contain nicotine or tobacco, such as cigarettes and e-cigarettes. If you need help quitting, ask your health care provider. °Limit your caffeine intake as told by your health care provider. °Try to get 8 hours of sleep each night. °Find ways to manage your stress that fits your lifestyle and personality. Consider trying meditation or yoga. °Try to anticipate stressful situations and allow extra time to manage them. °If you are struggling emotionally with the effects of your tremor, consider working with a mental health provider. °General instructions °Take over-the-counter and prescription medicines only as told by your health care provider. °Avoid extreme heat and extreme cold. °Keep all follow-up visits as told by your health care provider. This is important. Visits may include physical therapy visits. °Contact a health care provider if: °You experience any changes in the location or intensity of your tremors. °You start having a tremor after starting a new medicine. °You have tremor with other symptoms, such as: °Numbness. °Tingling. °Pain. °Weakness. °Your tremor gets worse. °Your tremor interferes with your daily life. °You feel down, blue, or sad for at least 2 weeks in a row. °Worrying about your tremor and what other people think about you interferes with your everyday life functions, including relationships, work, or school. °Summary °Essential tremor is a tremor without a known cause. Usually, it occurs when you are trying to perform an action. °You are more likely to develop this condition if you have a family member with essential tremor. °The main sign of a tremor is a rhythmic shaking of   certain parts of your body that is uncontrolled and unintentional. °Treatment for essential tremor depends on the severity of the condition. °This information is not intended to replace advice given to you by your health care provider. Make sure you discuss any questions  you have with your health care provider. °Document Revised: 03/16/2020 Document Reviewed: 03/18/2020 °Elsevier Patient Education © 2022 [employer] ° °

## 2021-10-10 ENCOUNTER — Telehealth: Payer: Self-pay | Admitting: Pharmacist

## 2021-10-10 DIAGNOSIS — I10 Essential (primary) hypertension: Secondary | ICD-10-CM

## 2021-10-10 NOTE — Patient Outreach (Signed)
Patient appearing on report for [place] Metric Hypertension Control due to last documented ambulatory blood pressure of 140/75 on 10/06/21. Next appointment with PCP is not scheduled  ? ?Outreached patient to discuss hypertension control and medication management.  ? ?Current medications: benazepril 40 mg daily ? ?Patient has an automated upper arm home BP machine. ? ?Current blood pressure readings: 130/90s at home ? ?Diabetes, T1: current regimen: Basaglar 12 units twice daily. Novolog per sliding scale, but reports significant issues with hypoglycemia if he takes the medication per the sliding scale recommendation ? ?Tremor: appointment with neurology in May, patient did not have labs checked at the office last week. ? ? ?Medications Reviewed Today   ? ? Reviewed by Alden Hipp, RPH-CPP (Pharmacist) on 10/10/21 at 1535  Med List Status: <None>  ? ?Medication Order Taking? Sig Documenting Provider Last Dose Status Informant  ?benazepril (LOTENSIN) 40 MG tablet [id number] Yes Take 1 tablet (40 mg total) by mouth daily. [name], PA-C Taking Active   ?divalproex (DEPAKOTE ER) 500 MG 24 hr tablet [id number] Yes Take 3 tablets (1,500 mg total) by mouth daily. Claiborne Rigg, NP Taking Active   ?glucose blood test strip [id number] Yes Use as instructed. MUST MAKE APPT FOR FURTHER REFILLS [name], PA-C Taking Active   ?ibuprofen (ADVIL) 800 MG tablet [id number] No Take 1 tablet (800 mg total) by mouth every 8 (eight) hours as needed.  ?Patient not taking: Reported on 10/10/2021  ? Claiborne Rigg, NP Not Taking Active   ?insulin aspart (NOVOLOG FLEXPEN) 100 UNIT/ML FlexPen [id number] Yes Monitor blood glucose 3 times daily. For blood sugars 0-150 give 0 units of insulin, 151-200 give 2 units of insulin, 201-250 give 4 units, 251-300 give 6 units, 301-350 give 8 units, 351-400 give 10 units,> 400 give 12 units and call MD [name], PA-C Taking Active   ?Insulin Glargine (BASAGLAR  KWIKPEN) 100 UNIT/ML [id number] Yes Inject 17 Units into the skin 2 (two) times daily. Hoy Register, MD Taking Active   ?  Discontinued 04/08/20 1341 (Cost of medication)   ?Insulin Pen Needle (B-D UF III MINI PEN NEEDLES) 31G X 5 MM MISC [id number] Yes Insert into the skin as prescribed [name], PA-C Taking Active   ?methocarbamol (ROBAXIN) 500 MG tablet [id number]  Take 1 tablet (500 mg total) by mouth every 8 (eight) hours as needed for muscle spasms. Claiborne Rigg, NP  Active   ?simvastatin (ZOCOR) 20 MG tablet [id number]  Take 1 tablet (20 mg total) by mouth at bedtime. [name], PA-C  Active   ?TRUEplus Lancets 28G MISC [id number] Yes use as directed 3 (three) times daily. [name], PA-C Taking Active   ?Med List Note Sena Hitch, RRT 09/15/11 1730): Pharmacy:[facility]  ? ?  ?  ? ?  ? ? ? ?Assessment/Plan: ? ?Hypertension: ?- Currently uncontrolled ?- Reviewed goal blood pressure.  ?- Scheduled visit with embedded pharmacist per clinic protocol.  ? ?Diabetes: ?- Discussed use of CGM. Patient is currently uninsured. Wonder if [employer] patient assistance program would be an option. Patient amenable to also working on device access with embedded pharmacist.  ? ?Tremor: ?- Patient will call the office and schedule a lab visit  ? ?Catie Eppie Gibson, PharmD, BCACP ?[facility] ?[phone] ? ? ?

## 2021-10-10 NOTE — Patient Instructions (Signed)
[name],  ? ?Bring all your medications, blood sugar readings and meter, and blood pressure readings and meter when you come to see [facility].  ? ?This program through the [facility] manufacturer may allow you to get a continuous glucose monitor through their program - [email or web address]_SelfService/ ? ?Check your blood pressure daily . Our goal is less than 130/80 ? ?We recommend a blood pressure cuff that goes around your upper arm, as these are generally the most accurate.  ? ?To appropriately check your blood pressure, make sure you do the following:  ?1) Avoid caffeine, exercise, or tobacco products for 30 minutes before checking. ?2) Sit with your back supported in a flat-backed chair. Rest your arm on something flat (arm of the chair, table, etc). ?3) Sit still with your feet flat on the floor, resting, for at least 5 minutes.  ?4) Check your blood pressure. Take 1-2 readings.  ? ?Write down these readings and bring with you to any provider appointments. Bring your home blood pressure machine with you to a provider's office for accuracy comparison at least once a year. ? ?Take care! ? ?Catie Eppie Gibson, PharmD, BCACP ?[facility] ?[phone] ? ? ?

## 2021-10-11 ENCOUNTER — Ambulatory Visit: Payer: Self-pay | Attending: Nurse Practitioner

## 2021-10-11 ENCOUNTER — Other Ambulatory Visit: Payer: Self-pay | Admitting: Nurse Practitioner

## 2021-10-11 ENCOUNTER — Other Ambulatory Visit: Payer: Self-pay | Admitting: Family Medicine

## 2021-10-11 DIAGNOSIS — E108 Type 1 diabetes mellitus with unspecified complications: Secondary | ICD-10-CM

## 2021-10-11 DIAGNOSIS — E785 Hyperlipidemia, unspecified: Secondary | ICD-10-CM

## 2021-10-11 DIAGNOSIS — I10 Essential (primary) hypertension: Secondary | ICD-10-CM

## 2021-10-12 LAB — COMPREHENSIVE METABOLIC PANEL
ALT: 16 IU/L (ref 0–44)
AST: 23 IU/L (ref 0–40)
Albumin/Globulin Ratio: 1.6 (ref 1.2–2.2)
Albumin: 3.6 g/dL — ABNORMAL LOW (ref 4.0–5.0)
Alkaline Phosphatase: 59 IU/L (ref 44–121)
BUN/Creatinine Ratio: 10 (ref 9–20)
BUN: 16 mg/dL (ref 6–24)
Bilirubin Total: 0.5 mg/dL (ref 0.0–1.2)
CO2: 29 mmol/L (ref 20–29)
Calcium: 9.3 mg/dL (ref 8.7–10.2)
Chloride: 100 mmol/L (ref 96–106)
Creatinine, Ser: 1.57 mg/dL — ABNORMAL HIGH (ref 0.76–1.27)
Globulin, Total: 2.3 g/dL (ref 1.5–4.5)
Glucose: 173 mg/dL — ABNORMAL HIGH (ref 70–99)
Potassium: 4.6 mmol/L (ref 3.5–5.2)
Sodium: 139 mmol/L (ref 134–144)
Total Protein: 5.9 g/dL — ABNORMAL LOW (ref 6.0–8.5)
eGFR: 56 mL/min/{1.73_m2} — ABNORMAL LOW (ref >59)

## 2021-10-12 LAB — TSH: TSH: 2.91 u[IU]/mL (ref 0.450–4.500)

## 2021-10-12 LAB — VALPROIC ACID LEVEL: Valproic Acid Lvl: 14 ug/mL — ABNORMAL LOW (ref 50–100)

## 2021-10-16 ENCOUNTER — Encounter: Payer: Self-pay | Admitting: Internal Medicine

## 2021-10-17 ENCOUNTER — Other Ambulatory Visit: Payer: Self-pay

## 2021-10-18 ENCOUNTER — Other Ambulatory Visit: Payer: Self-pay

## 2021-10-23 ENCOUNTER — Ambulatory Visit: Payer: Self-pay | Attending: Nurse Practitioner | Admitting: Clinical

## 2021-10-23 DIAGNOSIS — F411 Generalized anxiety disorder: Secondary | ICD-10-CM

## 2021-10-23 NOTE — Patient Instructions (Addendum)
[facility]  ?[address], [place], [place] [address] [phone] or [phone] ?WALK-IN URGENT CARE 24/7 FOR ANYONE ?[address] [place], [place]  [phone] ?Fax: [phone] [email or web address] ?*Interpreters available ?*Accepts all insurance and uninsured for Urgent Care needs ?*Accepts Medicaid and uninsured for outpatient treatment  ?  ? ?ONLY FOR [facility] ? ?New patient assessment and therapy walk-ins ?Mondays and Wednesdays 8am-11am ?First and second Fridays 1pm-5pm ? ?New patient psychiatry and medication management walk-ins:  ?Mondays, Wednesdays, Thursdays, Fridays 8am -11am ?NO PSYCHIATRY WALK-INS on TUESDAYS ?  ? ?[employer] ?Phone: [phone] ? ?National Suicide Prevention Hotline 24/7  ?564-716-5133 or (865)090-3278 ?ARanked.fi ? *NUMBER CHANGING TO 988 IN JULY 2022* ?Normal number will still be available ?          [facility]  ?Phone: [phone]  ?Address: [address] [place], [place] [address] ?Hours: Open 24/7, No appointment required ?(NO INSURANCE REQUIRED) ? ?[facility] (Mobile Crisis Line) ?Phone: [phone] ?Mobile Crisis: [phone] ? ?Call 911/Go to hospital ?Address: [address] [place], [place], [place] [address] ?

## 2021-10-23 NOTE — BH Specialist Note (Signed)
Integrated Behavioral Health Initial In-Person Visit ? ?MRN: [id number] ?Name: [name] ? ?Number of Integrated Behavioral Health Clinician visits: 1- Initial Visit ? ?Session Start time: 1338 ?   ?Session End time: 1430 ? ?Total time in minutes: 52 ? ? ?Types of Service: Individual psychotherapy ? ?Interpretor:No. Interpretor Name and Language: N/a ? ? Warm Hand Off Completed. ? ?  ? ?  ? ? ?Subjective: ?[name] is a 46 y.o. male accompanied by Partner/Significant Other ?Patient was referred by Georgian Co, PA-C for stress. ?Patient reports the following symptoms/concerns: Reports feeling depressed, trouble sleeping, decreased energy, self-esteem disturbances, trouble concentrating, anxiousness, excessive worrying, trouble relaxing, and irritability. Reports that he is experiencing housing and financial stressors. Reports that he is currently living in his pastor's home with 11 other people and has minimal privacy. Reports that he wants to find housing. Reports that he also experiences problems with employment. Reports that he becomes irritated at work and has had to leave jobs in the past. Reports that he frequently tries to hold his feelings in. Reports trouble sleeping and a hx of a going an entire night without sleeping at all. Reports a hx of trauma. ?Duration of problem: 5 years; Severity of problem: moderate ? ?Objective: ?Mood: Anxious and Depressed and Affect: Appropriate ?Risk of harm to self or others: No plan to harm self or others ? ?Life Context: ?Family and Social: Pt has three children. Pt is currently living in his pastor's home.  ?School/Work: Pt is employed full-time.  ?Self-Care: Pt goes to church.  ?Life Changes: Pt is experiencing housing problems. ? ?Patient and/or Family's Strengths/Protective Factors: ?Sense of purpose ? ?Goals Addressed: ?Patient will: ?Reduce symptoms of: anxiety and depression ?Increase knowledge and/or ability of: coping skills  ?Demonstrate ability to:  Increase healthy adjustment to current life circumstances ? ?Progress towards Goals: ?Ongoing ? ?Interventions: ?Interventions utilized: Copywriter, advertising, CBT Cognitive Behavioral Therapy, Supportive Counseling, and Link to [facility]  ?Standardized Assessments completed: GAD-7 and PHQ 9 ? ?  10/23/2021  ?  1:44 PM 10/06/2021  ?  4:34 PM 07/26/2021  ?  1:55 PM 05/16/2021  ? 11:34 AM  ?GAD 7 : Generalized Anxiety Score  ?Nervous, Anxious, on Edge 2 3 2 2   ?Control/stop worrying 2 1 2 2   ?Worry too much - different things 1 2 3 1   ?Trouble relaxing 3 3 3  0  ?Restless 0 3 3 0  ?Easily annoyed or irritable 1 2 0 0  ?Afraid - awful might happen 1 1 2  0  ?Total GAD 7 Score 10 15 15 5   ? ?  ? ?  10/23/2021  ?  1:42 PM 10/06/2021  ?  4:34 PM 07/26/2021  ?  1:54 PM 05/16/2021  ? 11:34 AM 12/30/2020  ? 11:29 AM  ?Depression screen PHQ 2/9  ?Decreased Interest 0 2 2 1 2   ?Down, Depressed, Hopeless 1 2 1  0 2  ?PHQ - 2 Score 1 4 3 1 4   ?Altered sleeping 2 3 2 2  0  ?Tired, decreased energy 2 3 2 2  0  ?Change in appetite 0 1 2 0 0  ?Feeling bad or failure about yourself  1 0 1 2 3   ?Trouble concentrating 3 3 0 2 0  ?Moving slowly or fidgety/restless 2 0 0 0 0  ?Suicidal thoughts 0 0 0 0 0  ?PHQ-9 Score 11 14 10 9 7   ?  ?Patient and/or Family Response: Pt receptive to tx. Pt receptive to  psychoeducation provided on anxiety, depression, and trauma. Pt receptive to cognitive restructuring to decrease negative thoughts. Pt receptive to identifying healthy coping skills.  ? ?Patient Centered Plan: ?Patient is on the following Treatment Plan(s):  Anxiety and depression ? ?Assessment: ?Denies SI/HI. Patient currently experiencing anxiety and depression. Pt is experiencing stress related to housing. Pt appears to be overwhelmed and experiences difficulty with communicating his concerns effectively at work. Pt also appears to have limited coping skills. ?  ?Patient may benefit from outpatient therapy and identifying  additional healthy coping skills. LCSW provided psychoeducation on depression, anxiety, and trauma. LCSW utilized cognitive restructuring to decrease negative thoughts. LCSW encouraged pt to identify healthy coping skills. Pt will start exercising as reports that this helped him in the past. LCSW encouraged pt to utilize deep breathing exercises and grounding techniques. LCSW provided pt with housing resources. LCSW referred pt to [facility]. ? ?Plan: ?Follow up with behavioral health clinician on : Await contact from [facility] for therapy.  ?Behavioral recommendations: Utilize deep breathing and grounding techniques. Identify additional healthy coping skills. Begin exercising.  ?Referral(s): Counselor ?"From scale of 1-10, how likely are you to follow plan?": 10 ?[facility]  ?[address], [place], [place] [address] [phone] or [phone] ?WALK-IN URGENT CARE 24/7 FOR ANYONE ?[address], [place], [place]  [phone] ?Fax: [phone] [email or web address] ?*Interpreters available ?*Accepts all insurance and uninsured for Urgent Care needs ?*Accepts Medicaid and uninsured for outpatient treatment  ?  ? ?ONLY FOR [facility] ? ?New patient assessment and therapy walk-ins ?Mondays and Wednesdays 8am-11am ?First and second Fridays 1pm-5pm ? ?New patient psychiatry and medication management walk-ins:  ?Mondays, Wednesdays, Thursdays, Fridays 8am -11am ?NO PSYCHIATRY WALK-INS on TUESDAYS ?  ?Stockton Nunley C Glennis Montenegro, LCSW ? ? ? ? ? ? ? ? ?

## 2021-10-27 ENCOUNTER — Ambulatory Visit: Payer: 59 | Attending: Nurse Practitioner | Admitting: Pharmacist

## 2021-10-27 ENCOUNTER — Other Ambulatory Visit: Payer: Self-pay

## 2021-10-27 VITALS — BP 148/80

## 2021-10-27 DIAGNOSIS — Z79899 Other long term (current) drug therapy: Secondary | ICD-10-CM | POA: Diagnosis not present

## 2021-10-27 DIAGNOSIS — E108 Type 1 diabetes mellitus with unspecified complications: Secondary | ICD-10-CM

## 2021-10-27 DIAGNOSIS — I10 Essential (primary) hypertension: Secondary | ICD-10-CM | POA: Insufficient documentation

## 2021-10-27 MED ORDER — FREESTYLE LIBRE 2 SENSOR MISC: 3 refills | Status: DC

## 2021-10-27 MED ORDER — AMLODIPINE BESYLATE 5 MG PO TABS
5.0000 mg | ORAL_TABLET | Freq: Every day | ORAL | 1 refills | Status: DC
  Filled 2021-10-27: qty 30, 30d supply, fill #0

## 2021-10-27 MED ORDER — FREESTYLE LIBRE 2 READER DEVI: 0 refills | Status: DC

## 2021-10-27 NOTE — Progress Notes (Signed)
? ?  S:    ?PCP: Zelda ? ?Patient arrives in good spirits. Presents to the clinic for hypertension evaluation, counseling, and management. Patient was referred and last seen by Dr. Laural Benes on 10/06/2021. BP at that visit was 140/75 at that visit. ? ?Medication adherence reported. ? ?Current BP Medications include:  Benazepril 40 mg daily  ? ?Dietary habits include: Mediterranean diet/DASH diet compliant  ?Exercise habits include: some through work; occasionally will work-out at [employer]  ?Family / Social history:  ?- FHx: HTN, DM ?- Tobacco: never smoker ?- Alcohol: denies alcohol use  ? ?O:  ?Vitals:  ? 10/27/21 1623  ?BP: (!) 148/80  ? ?Home BP readings: none ? ?Last 3 Office BP readings: ?BP Readings from Last 3 Encounters:  ?10/27/21 (!) 148/80  ?10/06/21 140/75  ?07/26/21 (!) 185/100  ? ? ?BMET ?   ?Component Value Date/Time  ? NA 139 10/11/2021 0854  ? K 4.6 10/11/2021 0854  ? CL 100 10/11/2021 0854  ? CO2 29 10/11/2021 0854  ? GLUCOSE 173 (H) 10/11/2021 0854  ? GLUCOSE 182 (H) 04/03/2019 0703  ? BUN 16 10/11/2021 0854  ? CREATININE 1.57 (H) 10/11/2021 0854  ? CREATININE 1.32 12/16/2015 1521  ? CALCIUM 9.3 10/11/2021 0854  ? GFRNONAA 62 02/26/2020 1647  ? GFRNONAA 68 12/16/2015 1521  ? GFRAA 71 02/26/2020 1647  ? GFRAA 79 12/16/2015 1521  ? ? ?Renal function: ?CrCl cannot be calculated (Unknown ideal weight.). ? ?Clinical ASCVD: No  ?The 10-year ASCVD risk score (Arnett DK, et al., 2019) is: 14% ?  Values used to calculate the score: ?    Age: 46 years ?    Sex: Male ?    Is Non-Hispanic African American: Yes ?    Diabetic: Yes ?    Tobacco smoker: No ?    Systolic Blood Pressure: 148 mmHg ?    Is BP treated: Yes ?    HDL Cholesterol: 72 mg/dL ?    Total Cholesterol: 240 mg/dL ? ?A/P: ?Hypertension longstanding currently above goal on current medications. BP Goal = < 130/80 mmHg. Medication adherence reported.  ?-Continued benazepril 40 mg daily. ?-Start amlodipine 5 mg daily.  ?-Counseled on lifestyle  modifications for blood pressure control including reduced dietary sodium, increased exercise, adequate sleep. ? ?Results reviewed and written information provided.   Total time in face-to-face counseling 15 minutes.   ?F/U Clinic Visit in 2-3 weeks.   ? ?Butch Penny, PharmD, BCACP, CPP ?Clinical Pharmacist ?[facility] ?[phone] ? ? ? ? ? ?

## 2021-11-02 ENCOUNTER — Other Ambulatory Visit: Payer: Self-pay

## 2021-11-09 ENCOUNTER — Encounter: Payer: Self-pay | Admitting: Nurse Practitioner

## 2021-11-09 ENCOUNTER — Telehealth: Payer: Self-pay | Admitting: Nurse Practitioner

## 2021-11-09 ENCOUNTER — Encounter (INDEPENDENT_AMBULATORY_CARE_PROVIDER_SITE_OTHER): Payer: Medicaid Other | Admitting: Ophthalmology

## 2021-11-09 NOTE — Telephone Encounter (Signed)
Pt came in to drop off paperwork for job. Informed pt that it could take 7-10 business days to be completed. ? ?Please contact pt when paperwork is complete.   ?

## 2021-11-15 ENCOUNTER — Other Ambulatory Visit: Payer: Self-pay | Admitting: Physician Assistant

## 2021-11-15 ENCOUNTER — Other Ambulatory Visit: Payer: Self-pay | Admitting: Nurse Practitioner

## 2021-11-15 DIAGNOSIS — G40309 Generalized idiopathic epilepsy and epileptic syndromes, not intractable, without status epilepticus: Secondary | ICD-10-CM

## 2021-11-15 DIAGNOSIS — E108 Type 1 diabetes mellitus with unspecified complications: Secondary | ICD-10-CM

## 2021-11-16 ENCOUNTER — Other Ambulatory Visit: Payer: Self-pay

## 2021-11-16 MED ORDER — DIVALPROEX SODIUM ER 500 MG PO TB24
1500.0000 mg | ORAL_TABLET | Freq: Every day | ORAL | 2 refills | Status: DC
  Filled 2021-11-16: qty 90, 30d supply, fill #0

## 2021-11-16 MED ORDER — NOVOLOG FLEXPEN 100 UNIT/ML ~~LOC~~ SOPN
PEN_INJECTOR | SUBCUTANEOUS | 0 refills | Status: DC
Start: 2021-11-16 — End: 2022-03-20
  Filled 2021-11-16: qty 9, 25d supply, fill #0
  Filled 2021-11-24: qty 9, 30d supply, fill #0

## 2021-11-16 NOTE — Telephone Encounter (Signed)
Requested Prescriptions  ?Pending Prescriptions Disp Refills  ?? insulin aspart (NOVOLOG FLEXPEN) 100 UNIT/ML FlexPen 15 mL 0  ?  Sig: Monitor blood glucose 3 times daily. For blood sugars 0-150 give 0 units of insulin, 151-200 give 2 units of insulin, 201-250 give 4 units, 251-300 give 6 units, 301-350 give 8 units, 351-400 give 10 units,> 400 give 12 units and call MD  ?  ? Endocrinology:  Diabetes - Insulins Failed - 11/15/2021 10:22 PM  ?  ?  Failed - HBA1C is between 0 and 7.9 and within 180 days  ?  HbA1c, POC (controlled diabetic range)  ?Date Value Ref Range Status  ?07/26/2021 9.4 (A) 0.0 - 7.0 % Final  ?   ?  ?  Passed - Valid encounter within last 6 months  ?  Recent Outpatient Visits   ?      ? 2 weeks ago Essential hypertension  ? Viera [place], RPH-CPP  ? 1 month ago Tremor of both hands  ? [place] Ladell Pier, MD  ? 3 months ago DM (diabetes mellitus), type 1 with complications [facility])  ? Tabiona Dolton, [place], [place]  ? 6 months ago Skin infection  ? [place] Elsie Stain, MD  ? 6 months ago Chronic bilateral low back pain without sciatica  ? [place] Gildardo Pounds, NP  ?  ?  ?Future Appointments   ?        ? In 1 week Gildardo Pounds, NP [place]  ? In 1 week Daisy Blossom, Jarome Matin, [place]  ?  ? ?  ?  ?  ? ? ?

## 2021-11-16 NOTE — Telephone Encounter (Signed)
Refill if appropriate .

## 2021-11-17 ENCOUNTER — Other Ambulatory Visit: Payer: Self-pay | Admitting: Nurse Practitioner

## 2021-11-17 ENCOUNTER — Ambulatory Visit: Payer: Medicaid Other | Attending: Nurse Practitioner

## 2021-11-17 ENCOUNTER — Other Ambulatory Visit: Payer: Self-pay

## 2021-11-17 DIAGNOSIS — E785 Hyperlipidemia, unspecified: Secondary | ICD-10-CM

## 2021-11-17 DIAGNOSIS — E108 Type 1 diabetes mellitus with unspecified complications: Secondary | ICD-10-CM

## 2021-11-18 LAB — LIPID PANEL
Chol/HDL Ratio: 3.5 ratio (ref 0.0–5.0)
Cholesterol, Total: 228 mg/dL — ABNORMAL HIGH (ref 100–199)
HDL: 65 mg/dL (ref >39)
LDL Chol Calc (NIH): 147 mg/dL — ABNORMAL HIGH (ref 0–99)
Triglycerides: 91 mg/dL (ref 0–149)
VLDL Cholesterol Cal: 16 mg/dL (ref 5–40)

## 2021-11-18 LAB — HEMOGLOBIN A1C
Est. average glucose Bld gHb Est-mCnc: 240 mg/dL
Hgb A1c MFr Bld: 10 % — ABNORMAL HIGH (ref 4.8–5.6)

## 2021-11-20 NOTE — Telephone Encounter (Signed)
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 Please advise  ?

## 2021-11-20 NOTE — Telephone Encounter (Signed)
Patient has virtual visit for tomorrow to complete paperwork. ?

## 2021-11-21 ENCOUNTER — Other Ambulatory Visit: Payer: Self-pay

## 2021-11-21 ENCOUNTER — Ambulatory Visit (HOSPITAL_BASED_OUTPATIENT_CLINIC_OR_DEPARTMENT_OTHER): Payer: Medicaid Other | Admitting: Nurse Practitioner

## 2021-11-21 ENCOUNTER — Encounter: Payer: Self-pay | Admitting: Nurse Practitioner

## 2021-11-21 DIAGNOSIS — E108 Type 1 diabetes mellitus with unspecified complications: Secondary | ICD-10-CM

## 2021-11-21 NOTE — Progress Notes (Signed)
Virtual Visit via Telephone Note ? I discussed the limitations, risks, security and privacy concerns of performing an evaluation and management service by telephone and the availability of in person appointments. I also discussed with the patient that there may be a patient responsible charge related to this service. The patient expressed understanding and agreed to proceed.  ? ? ?I connected with [name] on 11/21/21  at  10:30 AM EDT  EDT by telephone and verified that I am speaking with the correct person using two identifiers. ? ?Location of Patient: ?Private Residence ?  ?Location of Provider: ?Scientist, research (physical sciences) and [employer] Office  ?  ?Persons participating in Telemedicine visit: ?Donald Rankins FNP-BC ?[name]  ?  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 ?Current A1c is 10 and not at goal of 8 or less.  He previously had a Dexcom continuous glucose monitor but could not afford to keep up with the supplies for this and is now in the process of obtaining a freestyle libre but cannot afford to pick this up until Friday. ? ? ? ?Past Medical History:  ?Diagnosis Date  ? Diabetes mellitus   ? Hypertension   ? Seizures ([name])   ?  ?Past Surgical History:  ?Procedure Laterality Date  ? HERNIA REPAIR    ?  ?Family History  ?Problem Relation Age of Onset  ? Hypertension Mother   ? Diabetes Paternal Uncle   ?  ?Social History  ? ?Socioeconomic History  ? Marital status: Single  ?  Spouse name: Not on file  ? Number of children: 3  ? Years of education: Not on file  ? Highest education level: Some college, no degree  ?Occupational History  ? Not on file  ?Tobacco Use  ? Smoking status: Never  ?  Smokeless tobacco: Never  ?Vaping Use  ? Vaping Use: Never used  ?Substance and Sexual Activity  ? Alcohol use: Not Currently  ?  Alcohol/week: 1.0 standard drink  ?  Types: 1 Glasses of wine per week  ?  Comment: rarely  ? Drug use: No  ? Sexual activity: Not Currently  ?Other Topics Concern  ? Not on file  ?Social History Narrative  ? Pt is left handed  ? Lives alone in 2 story home  ? Has 3 children  ? Some college education  ? Worked as Equities trader  ? ?Social Determinants of Health  ? ?Financial Resource Strain: Not on file  ?Food Insecurity: Not on file  ?Transportation Needs: Not on file  ?Physical Activity: Not on file  ?Stress: Not on file  ?Social Connections: Not on file  ?  ? ?Observations/Objective: ?Awake, alert and oriented x 3 ? ? ?Review of Systems  ?Constitutional:  Negative for fever, malaise/fatigue and weight loss.  ?HENT: Negative.  Negative for nosebleeds.   ?Eyes:  Positive for blurred vision. Negative for double vision and photophobia.  ?     He has moderate diabetic retinopathy  ?Respiratory: Negative.  Negative for cough and shortness of breath.   ?Cardiovascular: Negative.  Negative for chest pain, palpitations and leg swelling.  ?Gastrointestinal: Negative.  Negative for heartburn, nausea and vomiting.  ?Musculoskeletal: Negative.  Negative for myalgias.  ?Neurological: Negative.  Negative for dizziness, focal weakness, seizures and headaches.  ?Psychiatric/Behavioral: Negative.  Negative for suicidal ideas.   ?Assessment and Plan: ?Diagnoses and all orders for this visit: ? ?DM (diabetes mellitus), type 1 with complications ([name]) ?Paperwork on hold at this time ?  ? ?Follow Up Instructions ?Return in about 3 months (around 02/21/2022).  ? ?  ?I discussed the assessment and treatment plan with the patient. The patient was provided an opportunity to ask questions and all were answered. The patient agreed with the plan and demonstrated an understanding of the instructions. ?  ?The patient  was advised to call back or seek an in-person evaluation if the symptoms worsen or if the condition fails to improve as anticipated. ? ?I provided 11 minutes of non-face-to-face time during this encounter including median intraservice time, reviewing previous notes, labs, imaging, medications and explaining diagnosis and management. ? ?Donald Pounds, FNP-BC  ?

## 2021-11-22 ENCOUNTER — Ambulatory Visit (INDEPENDENT_AMBULATORY_CARE_PROVIDER_SITE_OTHER): Payer: Self-pay | Admitting: Neurology

## 2021-11-22 ENCOUNTER — Encounter: Payer: Self-pay | Admitting: Neurology

## 2021-11-22 VITALS — BP 152/85 | HR 76 | Ht 71.0 in | Wt 180.0 lb

## 2021-11-22 DIAGNOSIS — R569 Unspecified convulsions: Secondary | ICD-10-CM

## 2021-11-22 DIAGNOSIS — R251 Tremor, unspecified: Secondary | ICD-10-CM

## 2021-11-22 NOTE — Progress Notes (Signed)
NEUROLOGY FOLLOW UP OFFICE NOTE  [name] [id number] 11-15-1975  HISTORY OF PRESENT ILLNESS: I had the pleasure of seeing [name] in follow-up in the neurology clinic on 11/22/2021.  The patient was last seen over a year ago for seizures. He is accompanied by his significant other Keta, who helps supplement the history today. He denies any seizures since August 2020, at that time his glucose level was 33. He has been on Depakote 1500mg  qhs but ran out of medication at least 2 weeks ago. He was his PCP at the end of March reporting bilateral hand tremors that started 4 months prior. It does not affect writing or using utensils/cups, he would be sitting on a chair and notices the shaking. Sometimes he feels the shaking more than visibly seeing it. It occurs even when calm, but more so when he has some anxiety. Keta notes she has not been seeing it for a couple of weeks, it is not as consistent recently. They deny any staring/unresponsive episodes, gaps in time, olfactory/gustatory hallucinations, focal numbness/tingling/weakness, myoclonic jerks. He denies any headaches, dizziness, diplopia, dysarthria/dysphagia. He does not sleep well, usually getting 4 hours of sleep due to his home situation with 8 people in the home. He lives with his pastor. He works doing [employer]. They are getting married next month. Mood depends on the day, Keta notes he is stressed most of the time. No family history of tremors. His Depakote level on 4/5 at 14, he reported he had not taken the Depakote the night before. He reports glucose levels have not been as labile, he has stopped taking the fast-acting insulin after 5pm and eats a lighter dinner so his glucose levels are not so high in the morning. He found this has helped a lot, highest glucose level in 2 weeks was in the 300s. His HbA1c this month was 10.   History on Initial Assessment 06/23/2018: This is a pleasant 46 year old left-handed man with  a history of type I diabetes, hypertension, presenting to establish care for seizures. He reports that seizures started around age 81. All his seizures appear nocturnal, usually occurring early in the morning where he wakes up with a tongue bite and incontinence, or his mother wakes him up. He recalls the worst seizure at age 79 when he bit his tongue so bad and woke up in the ER with right shoulder ?subluxation/dislocation. He states he was told it was not fractured. He has been taking Depakote ER 1500mg  qhs for several years without side effects and does not recall trying other AEDs. His longest seizure-free interval has been 3 years. Last seizure was 04/07/18, he woke up with bedsheets wet on the floor, lightheaded. Previous to this, he recalls a seizure 2 months prior. He lives alone and has not noticed any recent episodes of gaps in time, but in the past would be "out of space" when his sugars would go to 30-40s. Seizure triggers include exhaustion, sleep deprivation, heat, and low blood sugar (30-40). He reports glucose levels have been okay recently. He noticed some hand jerking when younger, none recently. He tastes blood after seizures. He has not been told of any staring/unresponsive episodes. He does not usually have headaches, but recently has been having headaches once a week with diffuse thumping where he has to focus his eyes. No nausea/vomiting, visual obscurations. He denies any dizziness, diplopia, dysarthria/dysphagia, neck/back pain, bowel/bladder dysfunction. He reinjured his right shoulder a few months ago and now  has a sharp sensation and tingling when he pulls his right shoulder back or lifts his arms. He started to notice memory changes a few months ago, he has to write things down or put them on a calendar. MMSE 24/30 at PCP office in October 2019. He states his stress level has been up since April, worse with the shoulder injury. He denies missing medications or bills. He is planning to go  back to school. He has some sleep issues which Trazodone helps with.   Epilepsy Risk Factors:  He is unsure if his mother and a paternal cousin have seizures (he has not personally witnessed any). He had a head injury at age 38 where he lost consciousness and has a scar on the right brow. Otherwise he had a normal birth and early development.  There is no history of febrile convulsions, CNS infections such as meningitis/encephalitis, neurosurgical procedures.  Lab Results  Component Value Date   VALPROATE 14 (L) 10/11/2021    PAST MEDICAL HISTORY: Past Medical History:  Diagnosis Date   Diabetes mellitus    Hypertension    Seizures (HCC)     MEDICATIONS: Current Outpatient Medications on File Prior to Visit  Medication Sig Dispense Refill   amLODipine (NORVASC) 5 MG tablet Take 1 tablet (5 mg total) by mouth daily. 90 tablet 1   benazepril (LOTENSIN) 40 MG tablet TAKE ONE TABLET BY MOUTH DAILY 90 tablet 1   Continuous Blood Gluc Receiver (FREESTYLE LIBRE 2 READER) DEVI Use to check blood sugar three times daily. E10.8 1 each 0   Continuous Blood Gluc Sensor (FREESTYLE LIBRE 2 SENSOR) MISC Use to check blood sugar three times daily. Change sensor every 2 weeks. E10.8 2 each 3   divalproex (DEPAKOTE ER) 500 MG 24 hr tablet Take 3 tablets (1,500 mg total) by mouth daily. 90 tablet 2   glucose blood test strip Use as instructed. MUST MAKE APPT FOR FURTHER REFILLS 100 each 0   ibuprofen (ADVIL) 800 MG tablet Take 1 tablet (800 mg total) by mouth every 8 (eight) hours as needed. (Patient not taking: Reported on 10/10/2021) 60 tablet 1   insulin aspart (NOVOLOG FLEXPEN) 100 UNIT/ML FlexPen Monitor blood glucose 3 times daily. For blood sugars 0-150 give 0 units of insulin, 151-200 give 2 units of insulin, 201-250 give 4 units, 251-300 give 6 units, 301-350 give 8 units, 351-400 give 10 units,> 400 give 12 units and call MD 15 mL 0   Insulin Glargine (BASAGLAR KWIKPEN) 100 UNIT/ML Inject 17 Units  into the skin 2 (two) times daily. 15 mL 2   Insulin Pen Needle (B-D UF III MINI PEN NEEDLES) 31G X 5 MM MISC Insert into the skin as prescribed 90 each 1   methocarbamol (ROBAXIN) 500 MG tablet Take 1 tablet (500 mg total) by mouth every 8 (eight) hours as needed for muscle spasms. 60 tablet 2   simvastatin (ZOCOR) 20 MG tablet TAKE ONE TABLET BY MOUTH at bedtime 90 tablet 1   TRUEplus Lancets 28G MISC use as directed 3 (three) times daily. 100 each 11   [DISCONTINUED] insulin lispro (HUMALOG KWIKPEN) 100 UNIT/ML KwikPen Monitor blood glucose TID. For blood sugars 0-150 give 0 units of insulin, 151-200 give 2 units of insulin, 201-250 give 4 units, 251-300 give 6 units, 301-350 give 8 units, 351-400 give 10 units,> 400 give 12 units and call M.D. Discussed hypoglycemia protocol. 15 mL 6   No current facility-administered medications on file prior to visit.  ALLERGIES: Allergies  Allergen Reactions   Geodon [Ziprasidone Hcl] Other (See Comments)    Severe muscle spasms (ENTIRE BODY)- had to call EMS   Ziprasidone Mesylate Other (See Comments)    "severe muscle spasms"- Had to call EMS    FAMILY HISTORY: Family History  Problem Relation Age of Onset   Hypertension Mother    Diabetes Paternal Uncle     SOCIAL HISTORY: Social History   Socioeconomic History   Marital status: Single    Spouse name: Not on file   Number of children: 3   Years of education: Not on file   Highest education level: Some college, no degree  Occupational History   Not on file  Tobacco Use   Smoking status: Never   Smokeless tobacco: Never  Vaping Use   Vaping Use: Never used  Substance and Sexual Activity   Alcohol use: Not Currently    Alcohol/week: 1.0 standard drink    Types: 1 Glasses of wine per week    Comment: rarely   Drug use: No   Sexual activity: Not Currently  Other Topics Concern   Not on file  Social History Narrative   Pt is left handed   Lives alone in 2 story home   Has 3  children   Some college education   Worked as HerbalistHVAC assistant   Social Determinants of Health   Financial Resource Strain: Not on file  Food Insecurity: Not on file  Transportation Needs: Not on file  Physical Activity: Not on file  Stress: Not on file  Social Connections: Not on file  Intimate Partner Violence: Not on file     PHYSICAL EXAM: Vitals:   11/22/21 1429  BP: (!) 152/85  Pulse: 76  SpO2: 99%   General: No acute distress Head:  Normocephalic/atraumatic Skin/Extremities: No rash, no edema Neurological Exam: alert and awake. No aphasia or dysarthria. Fund of knowledge is appropriate. Attention and concentration are normal.   Cranial nerves: Pupils equal, round. Extraocular movements intact with no nystagmus. Visual fields full.  No facial asymmetry.  Motor: Bulk and tone normal, no cogwheeling, muscle strength 5/5 throughout with no pronator drift.   Finger to nose testing intact.  Gait narrow-based and steady, able to tandem walk adequately.  Romberg negative. No resting tremor. There is a minimal high frequency low amplitude bilateral postural tremor, more noticeable on left thumb, minimal endpoint tremor. Good spiral drawing, no micrographia. Good finger taps.    IMPRESSION: This is a pleasant 46 yo LH man with a history of type I diabetes, hypertension, and seizures. His last seizure was in 02/2019 in the setting of hypoglycemia. In the past he reported seizures occurring when missing medication, however today they both feel his seizures have all occurred in the setting of hypoglycemia. He ran out of Depakote around 2 weeks ago and there appears to be some improvement of tremor off the Depakote. We discussed how Depakote can cause tremors. He does not want to restart this or to start a different medication for now. We agreed to do a 1-hour EEG, if abnormal, will discuss starting Zonisamide. If EEG is normal, we can continue to monitor symptoms and work on glucose control. He  is aware of [place] driving laws to stop driving after a seizure until 6 months seizure-free. Follow-up in 4 months, call for any changes.    Thank you for allowing me to participate in his care.  Please do not hesitate to call for any questions or  concerns.    Patrcia Dolly, M.D.   CC: Bertram Denver, NP

## 2021-11-22 NOTE — Patient Instructions (Signed)
Good to see you. ? ?Schedule 1-hour EEG ? ?2. Continue to monitor symptoms off Depakote. Continue with glucose control. ? ?3. Follow-up in 4 months, call for any changes ? ? ?Seizure Precautions: ?1. If medication has been prescribed for you to prevent seizures, take it exactly as directed.  Do not stop taking the medicine without talking to your doctor first, even if you have not had a seizure in a long time.  ? ?2. Avoid activities in which a seizure would cause danger to yourself or to others.  Don't operate dangerous machinery, swim alone, or climb in high or dangerous places, such as on ladders, roofs, or girders.  Do not drive unless your doctor says you may. ? ?3. If you have any warning that you may have a seizure, lay down in a safe place where you can't hurt yourself.   ? ?4.  No driving for 6 months from last seizure, as per [facility].   Please refer to the following link on the Epilepsy Foundation of America's website for more information: http://www.epilepsyfoundation.org/answerplace/Social/driving/drivingu.cfm  ? ?5.  Maintain good sleep hygiene. Avoid alcohol. ? ?6.  Contact your doctor if you have any problems that may be related to the medicine you are taking. ? ?7.  Call 911 and bring the patient back to the ED if: ?      ? A.  The seizure lasts longer than 5 minutes.      ? B.  The patient doesn't awaken shortly after the seizure ? C.  The patient has new problems such as difficulty seeing, speaking or moving ? D.  The patient was injured during the seizure ? E.  The patient has a temperature over 102 F (39C) ? F.  The patient vomited and now is having trouble breathing ?      ? ?

## 2021-11-23 ENCOUNTER — Other Ambulatory Visit: Payer: Self-pay

## 2021-11-24 ENCOUNTER — Ambulatory Visit: Payer: Self-pay | Attending: Nurse Practitioner | Admitting: Nurse Practitioner

## 2021-11-24 ENCOUNTER — Other Ambulatory Visit: Payer: Self-pay

## 2021-11-24 ENCOUNTER — Ambulatory Visit (INDEPENDENT_AMBULATORY_CARE_PROVIDER_SITE_OTHER): Payer: Self-pay | Admitting: Neurology

## 2021-11-24 ENCOUNTER — Encounter: Payer: Self-pay | Admitting: Nurse Practitioner

## 2021-11-24 VITALS — BP 178/82 | HR 72 | Ht 71.0 in | Wt 178.0 lb

## 2021-11-24 DIAGNOSIS — G40309 Generalized idiopathic epilepsy and epileptic syndromes, not intractable, without status epilepticus: Secondary | ICD-10-CM

## 2021-11-24 DIAGNOSIS — E1069 Type 1 diabetes mellitus with other specified complication: Secondary | ICD-10-CM

## 2021-11-24 DIAGNOSIS — E109 Type 1 diabetes mellitus without complications: Secondary | ICD-10-CM | POA: Insufficient documentation

## 2021-11-24 DIAGNOSIS — Z794 Long term (current) use of insulin: Secondary | ICD-10-CM | POA: Insufficient documentation

## 2021-11-24 DIAGNOSIS — I10 Essential (primary) hypertension: Secondary | ICD-10-CM | POA: Insufficient documentation

## 2021-11-24 DIAGNOSIS — E785 Hyperlipidemia, unspecified: Secondary | ICD-10-CM | POA: Insufficient documentation

## 2021-11-24 MED ORDER — AMLODIPINE BESYLATE 10 MG PO TABS
10.0000 mg | ORAL_TABLET | Freq: Every day | ORAL | 1 refills | Status: DC
  Filled 2021-11-24: qty 30, 30d supply, fill #0

## 2021-11-24 NOTE — Progress Notes (Unsigned)
Assessment & Plan:  Donald was seen today for hypertension.  Diagnoses and all orders for this visit:  Essential hypertension, benign -     amLODipine (NORVASC) 10 MG tablet; Take 1 tablet (10 mg total) by mouth daily. Uninsured so unable to provide blood pressure monitoring device  Type 1 diabetes mellitus with hyperlipidemia (HCC) CDL form completed and given back to patient today in office   Patient has been counseled on age-appropriate routine health concerns for screening and prevention. These are reviewed and up-to-date. Referrals have been placed accordingly. Immunizations are up-to-date or declined.    Subjective:   Chief Complaint  Patient presents with   Hypertension   HPI [name] 46 y.o. male presents to office today for follow up to HTN.  He is also requesting his CDL forms be completed He has a past medical history of type 1 diabetes mellitus which is poorly controlled, hypertension, and Seizures (being followed by neurology)  Needs root canal. Pain is intermittent.    HTN Blood pressure is poorly controlled today.  Will increase amlodipine from 5 mg to 10 mg.  He will remain on Lotensin 40 mg daily at this time.  He does not check his blood pressure at home.  BP Readings from Last 3 Encounters:  11/24/21 (!) 178/82  11/22/21 (!) 152/85  10/27/21 (!) 148/80     Review of Systems  Constitutional:  Negative for fever, malaise/fatigue and weight loss.  HENT: Negative.  Negative for nosebleeds.   Eyes:  Positive for blurred vision. Negative for double vision and photophobia.  Respiratory: Negative.  Negative for cough and shortness of breath.   Cardiovascular: Negative.  Negative for chest pain, palpitations and leg swelling.  Gastrointestinal: Negative.  Negative for heartburn, nausea and vomiting.  Musculoskeletal: Negative.  Negative for myalgias.  Neurological: Negative.  Negative for dizziness, focal weakness, seizures and headaches.   Psychiatric/Behavioral: Negative.  Negative for suicidal ideas.    Past Medical History:  Diagnosis Date   Diabetes mellitus    Hypertension    Seizures (HCC)     Past Surgical History:  Procedure Laterality Date   HERNIA REPAIR      Family History  Problem Relation Age of Onset   Hypertension Mother    Diabetes Paternal Uncle     Social History Reviewed with no changes to be made today.   Outpatient Medications Prior to Visit  Medication Sig Dispense Refill   benazepril (LOTENSIN) 40 MG tablet TAKE ONE TABLET BY MOUTH DAILY 90 tablet 1   Continuous Blood Gluc Receiver (FREESTYLE LIBRE 2 READER) DEVI Use to check blood sugar three times daily. E10.8 1 each 0   Continuous Blood Gluc Sensor (FREESTYLE LIBRE 2 SENSOR) MISC Use to check blood sugar three times daily. Change sensor every 2 weeks. E10.8 2 each 3   glucose blood test strip Use as instructed. MUST MAKE APPT FOR FURTHER REFILLS 100 each 0   ibuprofen (ADVIL) 800 MG tablet Take 1 tablet (800 mg total) by mouth every 8 (eight) hours as needed. 60 tablet 1   insulin aspart (NOVOLOG FLEXPEN) 100 UNIT/ML FlexPen Monitor blood glucose 3 times daily. For blood sugars 0-150 give 0 units of insulin, 151-200 give 2 units of insulin, 201-250 give 4 units, 251-300 give 6 units, 301-350 give 8 units, 351-400 give 10 units,> 400 give 12 units and call MD 15 mL 0   Insulin Glargine (BASAGLAR KWIKPEN) 100 UNIT/ML Inject 17 Units into the skin 2 (two)  times daily. 15 mL 2   Insulin Pen Needle (B-D UF III MINI PEN NEEDLES) 31G X 5 MM MISC Insert into the skin as prescribed 90 each 1   methocarbamol (ROBAXIN) 500 MG tablet Take 1 tablet (500 mg total) by mouth every 8 (eight) hours as needed for muscle spasms. 60 tablet 2   simvastatin (ZOCOR) 20 MG tablet TAKE ONE TABLET BY MOUTH at bedtime 90 tablet 1   TRUEplus Lancets 28G MISC use as directed 3 (three) times daily. 100 each 11   amLODipine (NORVASC) 5 MG tablet Take 1 tablet (5 mg  total) by mouth daily. 90 tablet 1   No facility-administered medications prior to visit.    Allergies  Allergen Reactions   Geodon [Ziprasidone Hcl] Other (See Comments)    Severe muscle spasms (ENTIRE BODY)- had to call EMS   Ziprasidone Mesylate Other (See Comments)    "severe muscle spasms"- Had to call EMS       Objective:    BP (!) 178/82   Pulse 72   Ht 5\' 11"  (1.803 m)   Wt 178 lb (80.7 kg)   SpO2 100%   BMI 24.83 kg/m  Wt Readings from Last 3 Encounters:  11/24/21 178 lb (80.7 kg)  11/22/21 180 lb (81.6 kg)  10/06/21 187 lb (84.8 kg)    Physical Exam Vitals and nursing note reviewed.  Constitutional:      Appearance: He is well-developed.  HENT:     Head: Normocephalic and atraumatic.  Eyes:     Comments: Wears glasses  Cardiovascular:     Rate and Rhythm: Normal rate and regular rhythm.     Heart sounds: Normal heart sounds. No murmur heard.   No friction rub. No gallop.  Pulmonary:     Effort: Pulmonary effort is normal. No tachypnea or respiratory distress.     Breath sounds: Normal breath sounds. No decreased breath sounds, wheezing, rhonchi or rales.  Chest:     Chest wall: No tenderness.  Abdominal:     General: Bowel sounds are normal.     Palpations: Abdomen is soft.  Musculoskeletal:        General: Normal range of motion.     Cervical back: Normal range of motion.  Skin:    General: Skin is warm and dry.  Neurological:     Mental Status: He is alert and oriented to person, place, and time.     Coordination: Coordination normal.  Psychiatric:        Behavior: Behavior normal. Behavior is cooperative.        Thought Content: Thought content normal.        Judgment: Judgment normal.         Patient has been counseled extensively about nutrition and exercise as well as the importance of adherence with medications and regular follow-up. The patient was given clear instructions to go to ER or return to medical center if symptoms don't  improve, worsen or new problems develop. The patient verbalized understanding.   Follow-up: Return for 4 weeks BP check luke. See me in 3 months.   10/08/21, FNP-BC [facility] [place], [place]   11/29/2021, 9:03 AM

## 2021-11-27 NOTE — Procedures (Signed)
ELECTROENCEPHALOGRAM REPORT  Date of Study: 11/24/2021  Patient's Name: [name] MRN: [id number] Date of Birth: 07-Oct-1975  Referring Provider: Dr. Ellouise Newer  Clinical History: This is a 46 year old man with seizures which he feels all occur during hypoglycemic episodes. EEG for classification.  Medications: NORVASC 5 MG tablet LOTENSIN 40 MG tablet NOVOLOG FLEXPEN 100 UNIT/ML FlexPen BASAGLAR KWIKPEN 100 UNIT/ML ROBAXIN 500 >MG tablet ZOCOR 20 MG tablet  Technical Summary: A multichannel digital 1-hour EEG recording measured by the international 10-20 system with electrodes applied with paste and impedances below 5000 ohms performed in our laboratory with EKG monitoring in an awake and asleep patient.  Hyperventilation and photic stimulation were performed.  The digital EEG was referentially recorded, reformatted, and digitally filtered in a variety of bipolar and referential montages for optimal display.    Description: The patient is awake and asleep during the recording.  During maximal wakefulness, there is a symmetric, medium voltage 9 Hz posterior dominant rhythm that attenuates with eye opening.  The record is symmetric.  During drowsiness and sleep, there is an increase in theta slowing of the background.  Vertex waves and symmetric sleep spindles were seen. Hyperventilation and photic stimulation did not elicit any abnormalities.  There were no epileptiform discharges or electrographic seizures seen.    EKG lead was unremarkable.  Impression: This 1-hour awake and asleep EEG is normal.    Clinical Correlation: A normal EEG does not exclude a clinical diagnosis of epilepsy.  If further clinical questions remain, prolonged EEG may be helpful.  Clinical correlation is advised.   Ellouise Newer, M.D.

## 2021-11-28 ENCOUNTER — Telehealth: Payer: Self-pay

## 2021-11-28 ENCOUNTER — Ambulatory Visit: Payer: Medicaid Other | Admitting: Pharmacist

## 2021-11-28 NOTE — Telephone Encounter (Signed)
-----   Message from Cameron Sprang, MD sent at 11/27/2021  1:44 PM EDT ----- Pls let him know the EEG is normal. It does not predict the future, only tells [place] about now. We can stay off seizure medication, however if he does have another seizure and sugar level is not low, we will need to start medication. Thanks

## 2021-11-28 NOTE — Telephone Encounter (Signed)
Pt called an informed the EEG is normal. It does not predict the future, only tells [place] about now. We can stay off seizure medication, however if he does have another seizure and sugar level is not low, we will need to start medication

## 2021-11-29 ENCOUNTER — Encounter: Payer: Self-pay | Admitting: Nurse Practitioner

## 2021-12-05 ENCOUNTER — Other Ambulatory Visit (HOSPITAL_COMMUNITY): Payer: Self-pay

## 2021-12-05 ENCOUNTER — Other Ambulatory Visit: Payer: Self-pay

## 2021-12-14 ENCOUNTER — Encounter: Payer: Self-pay | Admitting: Neurology

## 2021-12-25 ENCOUNTER — Other Ambulatory Visit: Payer: Self-pay | Admitting: Nurse Practitioner

## 2021-12-25 DIAGNOSIS — E108 Type 1 diabetes mellitus with unspecified complications: Secondary | ICD-10-CM

## 2021-12-25 NOTE — Telephone Encounter (Signed)
Medication Refill - Medication: Insulin Glargine (BASAGLAR KWIKPEN) 100 UNIT/ML [[id number]]   Has the patient contacted their pharmacy? Yes.   (Agent: If no, request that the patient contact the pharmacy for the refill. If patient does not wish to contact the pharmacy document the reason why and proceed with request.) (Agent: If yes, when and what did the pharmacy advise?)  Preferred Pharmacy (with phone number or street name):  [facility]  [address], [address] [place] [address]  Phone: [phone] Fax: [phone]  Hours: M-F 7:30a-6:00p    Has the patient been seen for an appointment in the last year OR does the patient have an upcoming appointment? Yes.    Agent: Please be advised that RX refills may take up to 3 business days. We ask that you follow-up with your pharmacy.

## 2021-12-26 ENCOUNTER — Other Ambulatory Visit: Payer: Self-pay

## 2021-12-26 MED ORDER — BASAGLAR KWIKPEN 100 UNIT/ML ~~LOC~~ SOPN
17.0000 [IU] | PEN_INJECTOR | Freq: Two times a day (BID) | SUBCUTANEOUS | 1 refills | Status: DC
  Filled 2021-12-26 – 2022-01-05 (×3): qty 9, 26d supply, fill #0

## 2021-12-26 NOTE — Telephone Encounter (Signed)
Requested medication (s) are due for refill today: no  Requested medication (s) are on the active medication list: historical med  Last refill:  08/25/21 15 ml  Future visit scheduled: yes Notes to clinic:  hisitorical med   Requested Prescriptions  Pending Prescriptions Disp Refills   Insulin Glargine (BASAGLAR KWIKPEN) 100 UNIT/ML 15 mL 2    Sig: Inject 17 Units into the skin 2 (two) times daily.     Endocrinology:  Diabetes - Insulins Failed - 12/25/2021  1:42 PM      Failed - HBA1C is between 0 and 7.9 and within 180 days    HbA1c, POC (controlled diabetic range)  Date Value Ref Range Status  07/26/2021 9.4 (A) 0.0 - 7.0 % Final   Hgb A1c MFr Bld  Date Value Ref Range Status  11/17/2021 10.0 (H) 4.8 - 5.6 % Final    Comment:             Prediabetes: 5.7 - 6.4          Diabetes: >6.4          Glycemic control for adults with diabetes: <7.0          Passed - Valid encounter within last 6 months    Recent Outpatient Visits           1 month ago Essential hypertension, benign   [place] [facility] [place], [place] W, NP   1 month ago DM (diabetes mellitus), type 1 with complications [facility])   [place] [facility] [place], Shea Stakes, NP   2 months ago Essential hypertension   [facility] Lois Huxley, Cornelius Moras, RPH-CPP   2 months ago Tremor of both hands   [facility] Marcine Matar, MD   5 months ago DM (diabetes mellitus[facility]    [facility] [place], Marzella Schlein, [place]       Future Appointments             In 2 months Claiborne Rigg, NP [employer] [facility]

## 2021-12-27 ENCOUNTER — Other Ambulatory Visit: Payer: Self-pay

## 2021-12-29 ENCOUNTER — Ambulatory Visit: Payer: Medicaid Other | Admitting: Pharmacist

## 2022-01-05 ENCOUNTER — Other Ambulatory Visit: Payer: Self-pay

## 2022-01-11 ENCOUNTER — Other Ambulatory Visit: Payer: Self-pay

## 2022-02-01 ENCOUNTER — Other Ambulatory Visit: Payer: Self-pay

## 2022-02-01 ENCOUNTER — Encounter (HOSPITAL_COMMUNITY): Payer: Self-pay

## 2022-02-01 ENCOUNTER — Emergency Department (HOSPITAL_COMMUNITY)
Admission: EM | Admit: 2022-02-01 | Discharge: 2022-02-02 | Disposition: A | Discharge status: Home / Self Care | Payer: Managed Care, Other (non HMO) | Attending: Emergency Medicine | Admitting: Emergency Medicine

## 2022-02-01 ENCOUNTER — Emergency Department (HOSPITAL_COMMUNITY): Payer: Managed Care, Other (non HMO)

## 2022-02-01 ENCOUNTER — Ambulatory Visit: Payer: Self-pay | Admitting: *Deleted

## 2022-02-01 DIAGNOSIS — R0602 Shortness of breath: Secondary | ICD-10-CM | POA: Diagnosis not present

## 2022-02-01 DIAGNOSIS — R739 Hyperglycemia, unspecified: Secondary | ICD-10-CM

## 2022-02-01 DIAGNOSIS — I10 Essential (primary) hypertension: Secondary | ICD-10-CM | POA: Insufficient documentation

## 2022-02-01 DIAGNOSIS — R112 Nausea with vomiting, unspecified: Secondary | ICD-10-CM

## 2022-02-01 DIAGNOSIS — Z79899 Other long term (current) drug therapy: Secondary | ICD-10-CM | POA: Diagnosis not present

## 2022-02-01 DIAGNOSIS — E860 Dehydration: Secondary | ICD-10-CM | POA: Diagnosis not present

## 2022-02-01 DIAGNOSIS — E1165 Type 2 diabetes mellitus with hyperglycemia: Secondary | ICD-10-CM | POA: Diagnosis not present

## 2022-02-01 DIAGNOSIS — Z794 Long term (current) use of insulin: Secondary | ICD-10-CM | POA: Insufficient documentation

## 2022-02-01 LAB — BASIC METABOLIC PANEL
Anion gap: 8 (ref 5–15)
BUN: 23 mg/dL — ABNORMAL HIGH (ref 6–20)
CO2: 24 mmol/L (ref 22–32)
Calcium: 8.9 mg/dL (ref 8.9–10.3)
Chloride: 104 mmol/L (ref 98–111)
Creatinine, Ser: 2.08 mg/dL — ABNORMAL HIGH (ref 0.61–1.24)
GFR, Estimated: 40 mL/min — ABNORMAL LOW (ref >60)
Glucose, Bld: 377 mg/dL — ABNORMAL HIGH (ref 70–99)
Potassium: 4.6 mmol/L (ref 3.5–5.1)
Sodium: 136 mmol/L (ref 135–145)

## 2022-02-01 LAB — URINALYSIS, COMPLETE (UACMP) WITH MICROSCOPIC
Bacteria, UA: NONE SEEN
Bilirubin Urine: NEGATIVE
Glucose, UA: >=500 mg/dL — AB
Hgb urine dipstick: NEGATIVE
Ketones, ur: 20 mg/dL — AB
Leukocytes,Ua: NEGATIVE
Nitrite: NEGATIVE
Protein, ur: >=300 mg/dL — AB
Specific Gravity, Urine: 1.017 (ref 1.005–1.030)
pH: 5 (ref 5.0–8.0)

## 2022-02-01 LAB — CBC WITH DIFFERENTIAL/PLATELET
Abs Immature Granulocytes: 0.01 10*3/uL (ref 0.00–0.07)
Basophils Absolute: 0 10*3/uL (ref 0.0–0.1)
Basophils Relative: 1 %
Eosinophils Absolute: 0.1 10*3/uL (ref 0.0–0.5)
Eosinophils Relative: 1 %
HCT: 41 % (ref 39.0–52.0)
Hemoglobin: 13.7 g/dL (ref 13.0–17.0)
Immature Granulocytes: 0 %
Lymphocytes Relative: 37 %
Lymphs Abs: 2.2 10*3/uL (ref 0.7–4.0)
MCH: 28.3 pg (ref 26.0–34.0)
MCHC: 33.4 g/dL (ref 30.0–36.0)
MCV: 84.7 fL (ref 80.0–100.0)
Monocytes Absolute: 0.4 10*3/uL (ref 0.1–1.0)
Monocytes Relative: 6 %
Neutro Abs: 3.1 10*3/uL (ref 1.7–7.7)
Neutrophils Relative %: 55 %
Platelets: 283 10*3/uL (ref 150–400)
RBC: 4.84 MIL/uL (ref 4.22–5.81)
RDW: 12 % (ref 11.5–15.5)
WBC: 5.8 10*3/uL (ref 4.0–10.5)
nRBC: 0 % (ref 0.0–0.2)

## 2022-02-01 LAB — CBG MONITORING, ED
Glucose-Capillary: 360 mg/dL — ABNORMAL HIGH (ref 70–99)
Glucose-Capillary: 253 mg/dL — ABNORMAL HIGH (ref 70–99)

## 2022-02-01 LAB — TROPONIN I (HIGH SENSITIVITY): Troponin I (High Sensitivity): 7 ng/L (ref <18)

## 2022-02-01 LAB — BLOOD GAS, VENOUS
Acid-Base Excess: 0.3 mmol/L (ref 0.0–2.0)
Bicarbonate: 25.4 mmol/L (ref 20.0–28.0)
O2 Saturation: 87.2 %
Patient temperature: 37
pCO2, Ven: 42 mmHg — ABNORMAL LOW (ref 44–60)
pH, Ven: 7.39 (ref 7.25–7.43)
pO2, Ven: 54 mmHg — ABNORMAL HIGH (ref 32–45)

## 2022-02-01 MED ORDER — ONDANSETRON HCL 4 MG/2ML IJ SOLN
4.0000 mg | Freq: Once | INTRAMUSCULAR | Status: AC
  Administered 2022-02-01: 4 mg via INTRAVENOUS
  Filled 2022-02-01: qty 2

## 2022-02-01 MED ORDER — PANTOPRAZOLE SODIUM 40 MG PO TBEC: 40.0000 mg | DELAYED_RELEASE_TABLET | Freq: Every day | ORAL | 0 refills | Status: DC

## 2022-02-01 MED ORDER — LACTATED RINGERS IV BOLUS
20.0000 mL/kg | Freq: Once | INTRAVENOUS | Status: AC
  Administered 2022-02-01: 1632 mL via INTRAVENOUS

## 2022-02-01 MED ORDER — INSULIN ASPART 100 UNIT/ML IJ SOLN
6.0000 [IU] | Freq: Once | INTRAMUSCULAR | Status: AC
  Administered 2022-02-01: 6 [IU] via SUBCUTANEOUS
  Filled 2022-02-01: qty 0.06

## 2022-02-01 NOTE — ED Provider Triage Note (Signed)
Emergency Medicine Provider Triage Evaluation Note  [name] , a 46 y.o. male  was evaluated in triage.  Pt complains of hypertension and vomiting.  Patient has a history of insulin-dependent diabetes.  He is being treated for hypertension however his blood pressures have not been decreased.  Patient states that he has had nausea every day and has been vomiting at least 3-4 times today.  He talked to his.  Primary care doctor and was told to come in to be evaluated for DKA.  Patient is also been having intermittent chest pain all day  Review of Systems  Positive: Nausea and vomiting Negative: Fever  Physical Exam  BP (!) 166/87 (BP Location: Left Arm)   Pulse 83   Temp 98.4 F (36.9 C) (Oral)   Resp 16   Ht 5\' 11"  (1.803 m)   Wt 81.6 kg   SpO2 100%   BMI 25.10 kg/m  Gen:   Awake, no distress   Resp:  Normal effort  MSK:   Moves extremities without difficulty  Other:  Appearing no abdominal tenderness.  Medical Decision Making  Medically screening exam initiated at 8:36 PM.  Appropriate orders placed.  N Day was informed that the remainder of the evaluation will be completed by another provider, this initial triage assessment does not replace that evaluation, and the importance of remaining in the ED until their evaluation is complete.  Work-up initiated   [name], PA-C 02/01/22 2040

## 2022-02-01 NOTE — ED Triage Notes (Signed)
Patient said he went to urgent care and was sent here. Has high blood pressure and when he eats he throws up. Is a diabetic. He checked his sugar 1 hour ago it was 251 and this was without any insulin. Has lost 13 pounds in the last 2 weeks. He said they mentioned he could be in DKA. He said he is feeling terrible.

## 2022-02-01 NOTE — Discharge Instructions (Addendum)
You are seen in the emergency department for evaluation of elevated blood sugars, nausea and vomiting, elevated blood pressure.  Your lab work showed you to be dehydrated but no evidence of DKA.  You were given fluids and insulin.  Will be important for you to continue your blood pressure medication and your diabetes medications.  We are prescribing you some acid medication for your stomach to help with your nausea.  Please follow-up with your primary care doctor.  Return to the emergency department if any high fevers or other concerning symptoms

## 2022-02-01 NOTE — ED Provider Notes (Signed)
[facility]-EMERGENCY DEPT Provider Note   CSN: [id number] Arrival date & time: 02/01/22  2005     History  Chief Complaint  Patient presents with   Hypertension   Hyperglycemia         [name] is a 46 y.o. male.  He has a history of hypertension diabetes seizure disorder.  He does not feel like his blood pressure medications have been controlling his blood pressure and has run out over the last week or 2.  He has also been switched on his diabetes medications and has been running higher sugars than normal although has some lows while he sleeping.  He has had intermittent headache nausea shortness of breath pain in his chest over the past 2 weeks and all of the symptoms were much worse today.  No fevers or chills.  No blood in the vomit.  No blurry vision double vision.  States he is lost 13 pounds in the last 2 weeks.  Vomiting with any type of input.  The history is provided by the patient.  Hypertension This is a chronic problem. Associated symptoms include chest pain, headaches and shortness of breath. Pertinent negatives include no abdominal pain. The symptoms are aggravated by exertion. Nothing relieves the symptoms. He has tried rest for the symptoms. The treatment provided no relief.  Hyperglycemia Associated symptoms: chest pain, fatigue, nausea, shortness of breath and vomiting   Associated symptoms: no abdominal pain, no dysuria and no fever        Home Medications Prior to Admission medications   Medication Sig Start Date End Date Taking? Authorizing Provider  amLODipine (NORVASC) 10 MG tablet Take 1 tablet (10 mg total) by mouth daily. 11/24/21 05/23/22  Claiborne Rigg, NP  benazepril (LOTENSIN) 40 MG tablet TAKE ONE TABLET BY MOUTH DAILY 10/11/21   Hoy Register, MD  Continuous Blood Gluc Receiver (FREESTYLE LIBRE 2 READER) DEVI Use to check blood sugar three times daily. E10.8 10/27/21   Hoy Register, MD  Continuous Blood Gluc Sensor  (FREESTYLE LIBRE 2 SENSOR) MISC Use to check blood sugar three times daily. Change sensor every 2 weeks. E10.8 10/27/21   Hoy Register, MD  glucose blood test strip Use as instructed. MUST MAKE APPT FOR FURTHER REFILLS 07/26/21   [name], PA-C  ibuprofen (ADVIL) 800 MG tablet Take 1 tablet (800 mg total) by mouth every 8 (eight) hours as needed. 02/22/21   Claiborne Rigg, NP  insulin aspart (NOVOLOG FLEXPEN) 100 UNIT/ML FlexPen Monitor blood glucose 3 times daily. For blood sugars 0-150 give 0 units of insulin, 151-200 give 2 units of insulin, 201-250 give 4 units, 251-300 give 6 units, 301-350 give 8 units, 351-400 give 10 units,> 400 give 12 units and call MD 11/16/21   Claiborne Rigg, NP  Insulin Glargine (BASAGLAR KWIKPEN) 100 UNIT/ML Inject 17 Units into the skin 2 (two) times daily. 12/26/21   Hoy Register, MD  Insulin Pen Needle (B-D UF III MINI PEN NEEDLES) 31G X 5 MM MISC Insert into the skin as prescribed 07/26/21   [name], PA-C  methocarbamol (ROBAXIN) 500 MG tablet Take 1 tablet (500 mg total) by mouth every 8 (eight) hours as needed for muscle spasms. 02/22/21   Claiborne Rigg, NP  simvastatin (ZOCOR) 20 MG tablet TAKE ONE TABLET BY MOUTH at bedtime 10/11/21   Hoy Register, MD  TRUEplus Lancets 28G MISC use as directed 3 (three) times daily. 07/26/21   [name],  PA-C  insulin lispro (HUMALOG KWIKPEN) 100 UNIT/ML KwikPen Monitor blood glucose TID. For blood sugars 0-150 give 0 units of insulin, 151-200 give 2 units of insulin, 201-250 give 4 units, 251-300 give 6 units, 301-350 give 8 units, 351-400 give 10 units,> 400 give 12 units and call M.D. Discussed hypoglycemia protocol. 03/22/20 04/08/20  Claiborne Rigg, NP      Allergies    Geodon [ziprasidone hcl] and Ziprasidone mesylate    Review of Systems   Review of Systems  Constitutional:  Positive for fatigue. Negative for fever.  Eyes:  Negative for visual disturbance.  Respiratory:  Positive for  shortness of breath.   Cardiovascular:  Positive for chest pain.  Gastrointestinal:  Positive for nausea and vomiting. Negative for abdominal pain and diarrhea.  Genitourinary:  Negative for dysuria.  Skin:  Negative for rash.  Neurological:  Positive for headaches.    Physical Exam Updated Vital Signs BP (!) 150/83   Pulse 82   Temp 98.4 F (36.9 C) (Oral)   Resp (!) 6   Ht 5\' 11"  (1.803 m)   Wt 81.6 kg   SpO2 100%   BMI 25.10 kg/m  Physical Exam Vitals and nursing note reviewed.  Constitutional:      General: He is not in acute distress.    Appearance: Normal appearance. He is well-developed.  HENT:     Head: Normocephalic and atraumatic.  Eyes:     Conjunctiva/sclera: Conjunctivae normal.  Cardiovascular:     Rate and Rhythm: Normal rate and regular rhythm.     Heart sounds: No murmur heard. Pulmonary:     Effort: Pulmonary effort is normal. No respiratory distress.     Breath sounds: Normal breath sounds.  Abdominal:     Palpations: Abdomen is soft.     Tenderness: There is no abdominal tenderness. There is no guarding or rebound.  Musculoskeletal:        General: Normal range of motion.     Cervical back: Neck supple.     Right lower leg: No edema.     Left lower leg: No edema.  Skin:    General: Skin is warm and dry.     Capillary Refill: Capillary refill takes less than 2 seconds.  Neurological:     General: No focal deficit present.     Mental Status: He is alert.     Sensory: No sensory deficit.     Motor: No weakness.     Gait: Gait normal.     ED Results / Procedures / Treatments   Labs (all labs ordered are listed, but only abnormal results are displayed) Labs Reviewed  BASIC METABOLIC PANEL - Abnormal; Notable for the following components:      Result Value   Glucose, Bld 377 (*)    BUN 23 (*)    Creatinine, Ser 2.08 (*)    GFR, Estimated 40 (*)    All other components within normal limits  BLOOD GAS, VENOUS - Abnormal; Notable for the  following components:   pCO2, Ven 42 (*)    pO2, Ven 54 (*)    All other components within normal limits  URINALYSIS, COMPLETE (UACMP) WITH MICROSCOPIC - Abnormal; Notable for the following components:   Glucose, UA >=500 (*)    Ketones, ur 20 (*)    Protein, ur >=300 (*)    All other components within normal limits  CBG MONITORING, ED - Abnormal; Notable for the following components:   Glucose-Capillary 360 (*)  All other components within normal limits  CBG MONITORING, ED - Abnormal; Notable for the following components:   Glucose-Capillary 253 (*)    All other components within normal limits  CBC WITH DIFFERENTIAL/PLATELET  TROPONIN I (HIGH SENSITIVITY)  TROPONIN I (HIGH SENSITIVITY)    EKG EKG Interpretation  Date/Time:  Thursday February 01 2022 21:00:25 EDT Ventricular Rate:  83 PR Interval:  159 QRS Duration: 82 QT Interval:  348 QTC Calculation: 402 R Axis:   40 Text Interpretation: Sinus rhythm Multiple premature complexes, vent & supraven Biatrial enlargement Abnormal R-wave progression, early transition Nonspecific T abnormalities, lateral leads new nonspecific ts from prior 3/22 Confirmed by Meridee Score [phone]) on 02/01/2022 9:11:53 PM  Radiology DG Chest Port 1 View  Result Date: 02/01/2022 CLINICAL DATA:  sob EXAM: PORTABLE CHEST 1 VIEW COMPARISON:  Chest x-ray 03/21/2019 FINDINGS: The heart and mediastinal contours are within normal limits. No focal consolidation. No pulmonary edema. No pleural effusion. No pneumothorax. No acute osseous abnormality. IMPRESSION: No active disease. Electronically Signed   By: Tish Frederickson M.D.   On: 02/01/2022 21:42    Procedures Procedures    Medications Ordered in ED Medications  lactated ringers bolus 1,632 mL (0 mLs Intravenous Stopped 02/01/22 2315)  insulin aspart (novoLOG) injection 6 Units (6 Units Subcutaneous Given 02/01/22 2204)  ondansetron (ZOFRAN) injection 4 mg (4 mg Intravenous Given 02/01/22 2146)    ED  Course/ Medical Decision Making/ A&P Clinical Course as of 02/02/22 1123  Thu Feb 01, 2022  2150 Chest x-ray interpreted by me as no acute infiltrates.  Awaiting radiology reading. [MB]    Clinical Course User Index [MB] Terrilee Files, MD                           Medical Decision Making Amount and/or Complexity of Data Reviewed Radiology: ordered.  Risk Prescription drug management.  This patient complains of nausea and vomiting, elevated blood pressure is elevated blood sugar; this involves an extensive number of treatment Options and is a complaint that carries with it a high risk of complications and morbidity. The differential includes hyperglycemia, hypertension, DKA, dehydration, renal failure, metabolic derangement, infection, ACS  I ordered, reviewed and interpreted labs, which included CBC with normal white count normal hemoglobin, chemistries with elevated glucose elevated creatinine normal Urinalysis with minimal ketones, troponins flat, VBG with normal pH I ordered medication IV fluids and nausea medication, subcu insulin with improvement in his symptoms and reviewed PMP when indicated. I ordered imaging studies which included chest x-ray and I independently    visualized and interpreted imaging which showed no acute findings  Previous records obtained and reviewed in epic no recent admissions Cardiac monitoring reviewed, normal sinus rhythm Social determinants considered, no significant barriers Critical Interventions: None  After the interventions stated above, I reevaluated the patient and found patient be symptomatically improved and tolerating p.o. Admission and further testing considered, no indications for admission at this time.  Recommended to patient to restart his blood pressure medications and follow-up with his PCP.  Return instructions discussed.          Final Clinical Impression(s) / ED Diagnoses Final diagnoses:  Primary hypertension   Hyperglycemia  Nausea and vomiting, unspecified vomiting type  Dehydration    Rx / DC Orders ED Discharge Orders          Ordered    pantoprazole (PROTONIX) 40 MG tablet  Daily  02/01/22 2252              Terrilee Files, MD 02/02/22 1125

## 2022-02-01 NOTE — Telephone Encounter (Signed)
Reason for Disposition . [1] MILD or MODERATE vomiting AND [2] present > 48 hours (2 days) (Exception: Mild vomiting with associated diarrhea.)  Answer Assessment - Initial Assessment Questions 1. VOMITING SEVERITY: "How many times have you vomited in the past 24 hours?"     - MILD:  1 - 2 times/day    - MODERATE: 3 - 5 times/day, decreased oral intake without significant weight loss or symptoms of dehydration    - SEVERE: 6 or more times/day, vomits everything or nearly everything, with significant weight loss, symptoms of dehydration      Mild- severe nausea 2. ONSET: "When did the vomiting begin?"      2-3 days- vomiting 3. FLUIDS: "What fluids or food have you vomited up today?" "Have you been able to keep any fluids down?"     Only thing keeping down 4. ABDOMEN PAIN: "Are your having any abdomen pain?" If Yes : "How bad is it and what does it feel like?" (e.g., crampy, dull, intermittent, constant)      no 5. DIARRHEA: "Is there any diarrhea?" If Yes, ask: "How many times today?"      no 6. CONTACTS: "Is there anyone else in the family with the same symptoms?"      no 7. CAUSE: "What do you think is causing your vomiting?"     Related to BP 8. HYDRATION STATUS: "Any signs of dehydration?" (e.g., dry mouth [not only dry lips], too weak to stand) "When did you last urinate?"          9. OTHER SYMPTOMS: "Do you have any other symptoms?" (e.g., fever, headache, vertigo, vomiting blood or coffee grounds, recent head injury)     headache 10. PREGNANCY: "Is there any chance you are pregnant?" "When was your last menstrual period?"  Protocols used: Vomiting-A-AH

## 2022-02-01 NOTE — Telephone Encounter (Signed)
Patient believes his BP is high-  Amlodipine 10 mg- not taking (allergic reaction) Benazepril 40 mg Simvastatin 20 mg  Glucose levels have not been in range- hard to control. COVID test- 1 week ago at [facility]- high BP reading  Patient does not have a way to check BP at home.   Chief Complaint: patient believes he had high BP- causing nausea/vomiting, headache Symptoms: see above Frequency: 2-3 days- vomiting Pertinent Negatives: Patient denies dehydration Disposition: [] ED /[x] Urgent Care (no appt availability in office) / [] Appointment(In office/virtual)/ []  [place] [facility]/ [] Home Care/ [] Refused Recommended Disposition /[] [place] [facility]/ []  Follow-up with PCP Additional Notes: no open appointment- advised UC- patient states he wants to change provider

## 2022-02-01 NOTE — Telephone Encounter (Signed)
FYI

## 2022-02-02 ENCOUNTER — Ambulatory Visit (HOSPITAL_COMMUNITY)
Admission: EM | Admit: 2022-02-02 | Discharge: 2022-02-02 | Disposition: A | Discharge status: Home / Self Care | Payer: 59 | Attending: Urology | Admitting: Urology

## 2022-02-02 DIAGNOSIS — F321 Major depressive disorder, single episode, moderate: Secondary | ICD-10-CM | POA: Diagnosis not present

## 2022-02-02 MED ORDER — FLUOXETINE HCL 10 MG PO CAPS: 10.0000 mg | ORAL_CAPSULE | Freq: Every day | ORAL | 2 refills | Status: DC
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
## 2022-02-02 NOTE — Discharge Instructions (Addendum)
  Discharge recommendations:  Patient is to take medications as prescribed. Please see information for follow-up appointment with psychiatry and therapy. Please follow up with your primary care provider for all medical related needs.  You may also contact your health insurance company for a list of in network provider in your area    Therapy: We recommend that patient participate in individual therapy to address mental health concerns.  Medications: The parent/guardian is to contact a medical professional and/or outpatient provider to address any new side effects that develop. Parent/guardian should update outpatient providers of any new medications and/or medication changes.   Atypical antipsychotics: If you are prescribed an atypical antipsychotic, it is recommended that your height, weight, BMI, blood pressure, fasting lipid panel, and fasting blood sugar be monitored by your outpatient providers.  Safety:  The patient should abstain from use of illicit substances/drugs and abuse of any medications. If symptoms worsen or do not continue to improve or if the patient becomes actively suicidal or homicidal then it is recommended that the patient return to the closest hospital emergency department, the [facility], or call 911 for further evaluation and treatment. National Suicide Prevention Lifeline 1-800-SUICIDE or (870)228-7605.  About 988 988 offers 24/7 access to trained crisis counselors who can help people experiencing mental health-related distress. People can call or text 988 or chat 988lifeline.org for themselves or if they are worried about a loved one who may need crisis support.  Crisis Mobile: [facility]:                     601-354-4363 (for crisis response 24 hours a day) [facility] Hotline:                                            586-147-2879

## 2022-02-02 NOTE — Progress Notes (Signed)
[facility] entered resources for outpatient therapy and medication management into AVS.  Nancee Liter [facility]

## 2022-02-04 NOTE — Telephone Encounter (Signed)
CRM: Patient states he wants to change providers. Please schedule him with Dr. Alvis Lemmings or Dr. Laural Benes to establish care. Thanks!

## 2022-02-07 ENCOUNTER — Telehealth: Payer: Self-pay | Admitting: Nurse Practitioner

## 2022-02-08 NOTE — Telephone Encounter (Signed)
Pt has been informed of appointment change.

## 2022-02-08 NOTE — Telephone Encounter (Signed)
Patient has been informed of appointment change.

## 2022-02-14 ENCOUNTER — Encounter (INDEPENDENT_AMBULATORY_CARE_PROVIDER_SITE_OTHER): Payer: Self-pay

## 2022-02-26 ENCOUNTER — Ambulatory Visit: Payer: Medicaid Other | Admitting: Nurse Practitioner

## 2022-02-28 ENCOUNTER — Ambulatory Visit: Payer: Managed Care, Other (non HMO) | Admitting: Family Medicine

## 2022-03-01 ENCOUNTER — Other Ambulatory Visit: Payer: Self-pay | Admitting: Nurse Practitioner

## 2022-03-01 DIAGNOSIS — E108 Type 1 diabetes mellitus with unspecified complications: Secondary | ICD-10-CM

## 2022-03-01 NOTE — Telephone Encounter (Signed)
Medication Refill - Medication:  insulin aspart (NOVOLOG FLEXPEN) 100 UNIT/ML FlexPen Insulin Glargine (BASAGLAR KWIKPEN) 100 UNIT/ML Continuous Blood Gluc Sensor (FREESTYLE LIBRE 2 SENSOR) MISC Continuous Blood Gluc Receiver (FREESTYLE LIBRE 2 READER) DEVI  Has the patient contacted their pharmacy? Yes.   Patient has no refills  Preferred Pharmacy (with phone number or street name):   [facility] [place], [place] - [address]. Phone:  [phone]  Fax:  [phone]     Has the patient been seen for an appointment in the last year OR does the patient have an upcoming appointment? Yes.    Agent: Please be advised that RX refills may take up to 3 business days. We ask that you follow-up with your pharmacy.

## 2022-03-01 NOTE — Telephone Encounter (Signed)
Requested medication (s) are due for refill today: yes   Requested medication (s) are on the active medication list: yes   Last refill:  novolog flexpen-11/16/21 #15 ml 0 refills, basaglar kwikpen-12/26/21 #60ml 1 refills, free style receiver- 10/27/21 #1 each 0 refills, free style libre 2 sensor- 10/27/21 #2 each 3 refills   Future visit scheduled: no  Notes to clinic:  protocol failed. Last lab 07/26/21. Do you want to refill Rxs?     Requested Prescriptions  Pending Prescriptions Disp Refills   insulin aspart (NOVOLOG FLEXPEN) 100 UNIT/ML FlexPen 15 mL 0    Sig: Monitor blood glucose 3 times daily. For blood sugars 0-150 give 0 units of insulin, 151-200 give 2 units of insulin, 201-250 give 4 units, 251-300 give 6 units, 301-350 give 8 units, 351-400 give 10 units,> 400 give 12 units and call MD     Endocrinology:  Diabetes - Insulins Failed - 03/01/2022 12:02 PM      Failed - HBA1C is between 0 and 7.9 and within 180 days    HbA1c, POC (controlled diabetic range)  Date Value Ref Range Status  07/26/2021 9.4 (A) 0.0 - 7.0 % Final   Hgb A1c MFr Bld  Date Value Ref Range Status  11/17/2021 10.0 (H) 4.8 - 5.6 % Final    Comment:             Prediabetes: 5.7 - 6.4          Diabetes: >6.4          Glycemic control for adults with diabetes: <7.0          Passed - Valid encounter within last 6 months    Recent Outpatient Visits           3 months ago Essential hypertension, benign   [place] [facility] [place], [place] W, NP   3 months ago DM (diabetes mellitus), type 1 with complications [facility] [facility])   [place] [place], Shea Stakes, NP   4 months ago Essential hypertension   [facility] [place], Cornelius Moras, RPH-CPP   4 months ago Tremor of both hands   [place] [facility] Marcine Matar, MD   7 months ago DM (diabetes mellitus), type 1 with complications [facility])     [facility] [place], Marylene Land M, [place]               Insulin Glargine (BASAGLAR KWIKPEN) 100 UNIT/ML 15 mL 1    Sig: Inject 17 Units into the skin 2 (two) times daily.     Endocrinology:  Diabetes - Insulins Failed - 03/01/2022 12:02 PM      Failed - HBA1C is between 0 and 7.9 and within 180 days    HbA1c, POC (controlled diabetic range)  Date Value Ref Range Status  07/26/2021 9.4 (A) 0.0 - 7.0 % Final   Hgb A1c MFr Bld  Date Value Ref Range Status  11/17/2021 10.0 (H) 4.8 - 5.6 % Final    Comment:             Prediabetes: 5.7 - 6.4          Diabetes: >6.4          Glycemic control for adults with diabetes: <7.0          Passed - Valid encounter within last 6 months    Recent Outpatient Visits  3 months ago Essential hypertension, benign   [place] [facility] [place], [place] W, NP   3 months ago DM (diabetes mellitus), type 1 with complications [place] [facility])   [place] [place], Shea Stakes, NP   4 months ago Essential hypertension   [facility] [place], Cornelius Moras, RPH-CPP   4 months ago Tremor of both hands   [place] [facility] Marcine Matar, MD   7 months ago DM (diabetes mellitus), type 1 with complications [facility])   [place] [facility] [place], [place] M, [place]               Continuous Blood Gluc Receiver (FREESTYLE LIBRE 2 READER) DEVI 1 each 0    Sig: Use to check blood sugar three times daily. E10.8     Endocrinology: Diabetes - Testing Supplies Passed - 03/01/2022 12:02 PM      Passed - Valid encounter within last 12 months    Recent Outpatient Visits           3 months ago Essential hypertension, benign   [place] [facility] [place], [place] W, NP   3 months ago DM (diabetes mellitus), type 1 with complications [facility])   [place] [facility] [place], Shea Stakes, NP   4  months ago Essential hypertension   [facility] Drucilla Chalet, RPH-CPP   4 months ago Tremor of both hands   [place] [facility] Marcine Matar, MD   7 months ago DM (diabetes mellitus), type 1 with complications [facility])   [place] [facility] [place], Marylene Land M, PA-C               Continuous Blood Gluc Sensor (FREESTYLE LIBRE 2 SENSOR) MISC 2 each 3    Sig: Use to check blood sugar three times daily. Change sensor every 2 weeks. E10.8     Endocrinology: Diabetes - Testing Supplies Passed - 03/01/2022 12:02 PM      Passed - Valid encounter within last 12 months    Recent Outpatient Visits           3 months ago Essential hypertension, benign   [place] [facility] [place], [place] W, NP   3 months ago DM (diabetes mellitus), type 1 with complications [facility])   [place] [facility] Wellness [place] Rigg, NP   4 months ago Essential hypertension   [facility] Wellness Lois Huxley, Cornelius Moras, RPH-CPP   4 months ago Tremor of both hands   [place] [facility] Marcine Matar, MD   7 months ago DM (diabetes mellitus), type 1 with complications [facility])   [facility] [place], [place], [place]

## 2022-03-06 ENCOUNTER — Other Ambulatory Visit: Payer: Self-pay

## 2022-03-07 ENCOUNTER — Telehealth: Payer: Self-pay | Admitting: Nurse Practitioner

## 2022-03-07 ENCOUNTER — Other Ambulatory Visit: Payer: Self-pay | Admitting: Family Medicine

## 2022-03-07 DIAGNOSIS — E108 Type 1 diabetes mellitus with unspecified complications: Secondary | ICD-10-CM

## 2022-03-07 MED ORDER — FREESTYLE LIBRE 2 SENSOR MISC: 3 refills | Status: DC

## 2022-03-07 MED ORDER — FREESTYLE LIBRE 2 READER DEVI: 0 refills | Status: DC

## 2022-03-07 NOTE — Telephone Encounter (Signed)
Patient called in stating he order the [facility] 2 Reader Receiver and got it but he needs the Sensors. He only received the Receiver. Please assist patient further. The patient needs that called into [facility] pharmacy [facility]  [id number] [place],  - [address] AT [facility] [address] Phone:  [phone]  Fax:  [phone]

## 2022-03-07 NOTE — Telephone Encounter (Signed)
Rx for sensors sent.  

## 2022-03-20 ENCOUNTER — Telehealth: Payer: 59 | Admitting: Physician Assistant

## 2022-03-20 ENCOUNTER — Other Ambulatory Visit: Payer: Self-pay | Admitting: Nurse Practitioner

## 2022-03-20 ENCOUNTER — Ambulatory Visit: Payer: Self-pay | Admitting: *Deleted

## 2022-03-20 DIAGNOSIS — E108 Type 1 diabetes mellitus with unspecified complications: Secondary | ICD-10-CM

## 2022-03-20 DIAGNOSIS — I129 Hypertensive chronic kidney disease with stage 1 through stage 4 chronic kidney disease, or unspecified chronic kidney disease: Secondary | ICD-10-CM

## 2022-03-20 DIAGNOSIS — N183 Chronic kidney disease, stage 3 unspecified: Secondary | ICD-10-CM

## 2022-03-20 DIAGNOSIS — E1022 Type 1 diabetes mellitus with diabetic chronic kidney disease: Secondary | ICD-10-CM

## 2022-03-20 MED ORDER — METOPROLOL SUCCINATE ER 25 MG PO TB24: 25.0000 mg | ORAL_TABLET | Freq: Every day | ORAL | 0 refills | Status: DC

## 2022-03-20 MED ORDER — CVS ADVANCED BP MONITOR DEVI: 1.0000 | Freq: Every day | 0 refills | Status: DC

## 2022-03-20 NOTE — Patient Instructions (Signed)
[name], thank you for joining Leeanne Rio, PA-C for today's virtual visit.  While this provider is not your primary care provider (PCP), if your PCP is located in our provider database this encounter information will be shared with them immediately following your visit.  Consent: (Patient) [name] provided verbal consent for this virtual visit at the beginning of the encounter.  Current Medications:  Current Outpatient Medications:    Blood Pressure Monitoring ([facility] ADVANCED BP MONITOR) DEVI, 1 Device by Does not apply route daily., Disp: 1 each, Rfl: 0   metoprolol succinate (TOPROL-XL) 25 MG 24 hr tablet, Take 1 tablet (25 mg total) by mouth daily., Disp: 90 tablet, Rfl: 0   benazepril (LOTENSIN) 40 MG tablet, TAKE ONE TABLET BY MOUTH DAILY (Patient taking differently: Take 40 mg by mouth daily.), Disp: 90 tablet, Rfl: 1   Continuous Blood Gluc Receiver (FREESTYLE LIBRE 2 READER) DEVI, Use to check blood sugar three times daily. E10.8, Disp: 1 each, Rfl: 0   Continuous Blood Gluc Sensor (FREESTYLE LIBRE 2 SENSOR) MISC, Use to check blood sugar three times daily. Change sensor every 2 weeks. E10.8, Disp: 2 each, Rfl: 3   FLUoxetine (PROZAC) 10 MG capsule, Take 1 capsule (10 mg total) by mouth daily., Disp: 30 capsule, Rfl: 2   glucose blood test strip, Use as instructed. MUST MAKE APPT FOR FURTHER REFILLS, Disp: 100 each, Rfl: 0   ibuprofen (ADVIL) 800 MG tablet, Take 1 tablet (800 mg total) by mouth every 8 (eight) hours as needed. (Patient taking differently: Take 800 mg by mouth every 8 (eight) hours as needed for mild pain.), Disp: 60 tablet, Rfl: 1   insulin aspart (NOVOLOG FLEXPEN) 100 UNIT/ML FlexPen, Monitor blood glucose 3 times daily. For blood sugars 0-150 give 0 units of insulin, 151-200 give 2 units of insulin, 201-250 give 4 units, 251-300 give 6 units, 301-350 give 8 units, 351-400 give 10 units,> 400 give 12 units and call MD (Patient taking differently: Inject  2-10 Units into the skin 3 (three) times daily with meals. Monitor blood glucose 3 times daily. For blood sugars 0-150 give 0 units of insulin, 151-200 give 2 units of insulin, 201-250 give 4 units, 251-300 give 6 units, 301-350 give 8 units, 351-400 give 10 units,> 400 give 12 units and call MD), Disp: 15 mL, Rfl: 0   Insulin Glargine (BASAGLAR KWIKPEN) 100 UNIT/ML, Inject 17 Units into the skin 2 (two) times daily. (Patient taking differently: Inject 10 Units into the skin 2 (two) times daily.), Disp: 15 mL, Rfl: 1   Insulin Pen Needle (B-D UF III MINI PEN NEEDLES) 31G X 5 MM MISC, Insert into the skin as prescribed, Disp: 90 each, Rfl: 1   methocarbamol (ROBAXIN) 500 MG tablet, Take 1 tablet (500 mg total) by mouth every 8 (eight) hours as needed for muscle spasms., Disp: 60 tablet, Rfl: 2   pantoprazole (PROTONIX) 40 MG tablet, Take 1 tablet (40 mg total) by mouth daily., Disp: 30 tablet, Rfl: 0   simvastatin (ZOCOR) 20 MG tablet, TAKE ONE TABLET BY MOUTH at bedtime (Patient taking differently: Take 20 mg by mouth every evening.), Disp: 90 tablet, Rfl: 1   TRUEplus Lancets 28G MISC, use as directed 3 (three) times daily., Disp: 100 each, Rfl: 11   Medications ordered in this encounter:  Meds ordered this encounter  Medications   metoprolol succinate (TOPROL-XL) 25 MG 24 hr tablet    Sig: Take 1 tablet (25 mg total) by  mouth daily.    Dispense:  90 tablet    Refill:  0    Order Specific Question:   Supervising Provider    Answer:   Merrilee Jansky [id number]   Blood Pressure Monitoring (CVS ADVANCED BP MONITOR) DEVI    Sig: 1 Device by Does not apply route daily.    Dispense:  1 each    Refill:  0    Order Specific Question:   Supervising Provider    Answer:   Merrilee Jansky [id number]     *If you need refills on other medications prior to your next appointment, please contact your pharmacy*  Follow-Up: Call back or seek an in-person evaluation if the symptoms worsen or if the  condition fails to improve as anticipated.  Other Instructions Please keep well-hydrated and get plenty of rest. Please follow the dietary recommendations below. Continue the benazepril once daily as directed, adding on the metoprolol once daily. Use the blood pressure cuff I have sent to the pharmacy to check blood pressure daily at home and record. You will want to take these readings to your next follow-up with your primary care provider. If at any point in time you notice worsening blood pressure or any associated chest pain, shortness of breath, frequent headache, vision changes, lightheadedness or dizziness, you need an ER evaluation ASAP.  Please do not delay care.  Increase your Basaglar to 16 units each a.m. and 14 units each evening. Restart the NovoLog (short acting insulin), taking as directed by your primary care provider. Continue to use your Dexcom to monitor blood glucose levels.  I have messaged your primary care provider to see if they can help facilitate a follow-up for you in office within a week. If they are not able to, I want you to be evaluated at nearest urgent care for repeat assessment of blood pressure and so that they can recheck your kidney function.  DASH Eating Plan DASH stands for Dietary Approaches to Stop Hypertension. The DASH eating plan is a healthy eating plan that has been shown to: Reduce high blood pressure (hypertension). Reduce your risk for type 2 diabetes, heart disease, and stroke. Help with weight loss. What are tips for following this plan? Reading food labels Check food labels for the amount of salt (sodium) per serving. Choose foods with less than 5 percent of the Daily Value of sodium. Generally, foods with less than 300 milligrams (mg) of sodium per serving fit into this eating plan. To find whole grains, look for the word "whole" as the first word in the ingredient list. Shopping Buy products labeled as "low-sodium" or "no salt  added." Buy fresh foods. Avoid canned foods and pre-made or frozen meals. Cooking Avoid adding salt when cooking. Use salt-free seasonings or herbs instead of table salt or sea salt. Check with your health care provider or pharmacist before using salt substitutes. Do not fry foods. Cook foods using healthy methods such as baking, boiling, grilling, roasting, and broiling instead. Cook with heart-healthy oils, such as olive, canola, avocado, soybean, or sunflower oil. Meal planning  Eat a balanced diet that includes: 4 or more servings of fruits and 4 or more servings of vegetables each day. Try to fill one-half of your plate with fruits and vegetables. 6-8 servings of whole grains each day. Less than 6 oz (170 g) of lean meat, poultry, or fish each day. A 3-oz (85-g) serving of meat is about the same size as a deck of cards. One  egg equals 1 oz (28 g). 2-3 servings of low-fat dairy each day. One serving is 1 cup (237 mL). 1 serving of nuts, seeds, or beans 5 times each week. 2-3 servings of heart-healthy fats. Healthy fats called omega-3 fatty acids are found in foods such as walnuts, flaxseeds, fortified milks, and eggs. These fats are also found in cold-water fish, such as sardines, salmon, and mackerel. Limit how much you eat of: Canned or prepackaged foods. Food that is high in trans fat, such as some fried foods. Food that is high in saturated fat, such as fatty meat. Desserts and other sweets, sugary drinks, and other foods with added sugar. Full-fat dairy products. Do not salt foods before eating. Do not eat more than 4 egg yolks a week. Try to eat at least 2 vegetarian meals a week. Eat more home-cooked food and less restaurant, buffet, and fast food. Lifestyle When eating at a restaurant, ask that your food be prepared with less salt or no salt, if possible. If you drink alcohol: Limit how much you use to: 0-1 drink a day for women who are not pregnant. 0-2 drinks a day for  men. Be aware of how much alcohol is in your drink. In the [place] one drink equals one 12 oz bottle of beer (355 mL), one 5 oz glass of wine (148 mL), or one 1 oz glass of hard liquor (44 mL). General information Avoid eating more than 2,300 mg of salt a day. If you have hypertension, you may need to reduce your sodium intake to 1,500 mg a day. Work with your health care provider to maintain a healthy body weight or to lose weight. Ask what an ideal weight is for you. Get at least 30 minutes of exercise that causes your heart to beat faster (aerobic exercise) most days of the week. Activities may include walking, swimming, or biking. Work with your health care provider or dietitian to adjust your eating plan to your individual calorie needs. What foods should I eat? Fruits All fresh, dried, or frozen fruit. Canned fruit in natural juice (without added sugar). Vegetables Fresh or frozen vegetables (raw, steamed, roasted, or grilled). Low-sodium or reduced-sodium tomato and vegetable juice. Low-sodium or reduced-sodium tomato sauce and tomato paste. Low-sodium or reduced-sodium canned vegetables. Grains Whole-grain or whole-wheat bread. Whole-grain or whole-wheat pasta. Brown rice. Orpah Cobb. Bulgur. Whole-grain and low-sodium cereals. Pita bread. Low-fat, low-sodium crackers. Whole-wheat flour tortillas. Meats and other proteins Skinless chicken or [place]. Ground chicken or [place]. Pork with fat trimmed off. Fish and seafood. Egg whites. Dried beans, peas, or lentils. Unsalted nuts, nut butters, and seeds. Unsalted canned beans. Lean cuts of beef with fat trimmed off. Low-sodium, lean precooked or cured meat, such as sausages or meat loaves. Dairy Low-fat (1%) or fat-free (skim) milk. Reduced-fat, low-fat, or fat-free cheeses. Nonfat, low-sodium ricotta or cottage cheese. Low-fat or nonfat yogurt. Low-fat, low-sodium cheese. Fats and oils Soft margarine without trans fats. Vegetable oil.  Reduced-fat, low-fat, or light mayonnaise and salad dressings (reduced-sodium). Canola, safflower, olive, avocado, soybean, and sunflower oils. Avocado. Seasonings and condiments Herbs. Spices. Seasoning mixes without salt. Other foods Unsalted popcorn and pretzels. Fat-free sweets. The items listed above may not be a complete list of foods and beverages you can eat. Contact a dietitian for more information. What foods should I avoid? Fruits Canned fruit in a light or heavy syrup. Fried fruit. Fruit in cream or butter sauce. Vegetables Creamed or fried vegetables. Vegetables in a cheese sauce. Regular  canned vegetables (not low-sodium or reduced-sodium). Regular canned tomato sauce and paste (not low-sodium or reduced-sodium). Regular tomato and vegetable juice (not low-sodium or reduced-sodium). Angie Fava. Olives. Grains Baked goods made with fat, such as croissants, muffins, or some breads. Dry pasta or rice meal packs. Meats and other proteins Fatty cuts of meat. Ribs. Fried meat. Berniece Salines. Bologna, salami, and other precooked or cured meats, such as sausages or meat loaves. Fat from the back of a pig (fatback). Bratwurst. Salted nuts and seeds. Canned beans with added salt. Canned or smoked fish. Whole eggs or egg yolks. Chicken or [place] with skin. Dairy Whole or 2% milk, cream, and half-and-half. Whole or full-fat cream cheese. Whole-fat or sweetened yogurt. Full-fat cheese. Nondairy creamers. Whipped toppings. Processed cheese and cheese spreads. Fats and oils Butter. Stick margarine. Lard. Shortening. Ghee. Bacon fat. Tropical oils, such as coconut, palm kernel, or palm oil. Seasonings and condiments Onion salt, garlic salt, seasoned salt, table salt, and sea salt. Worcestershire sauce. Tartar sauce. Barbecue sauce. Teriyaki sauce. Soy sauce, including reduced-sodium. Steak sauce. Canned and packaged gravies. Fish sauce. Oyster sauce. Cocktail sauce. Store-bought horseradish. Ketchup. Mustard.  Meat flavorings and tenderizers. Bouillon cubes. Hot sauces. Pre-made or packaged marinades. Pre-made or packaged taco seasonings. Relishes. Regular salad dressings. Other foods Salted popcorn and pretzels. The items listed above may not be a complete list of foods and beverages you should avoid. Contact a dietitian for more information. Where to find more information National Heart, Lung, and Blood Institute: https://wilson-eaton.com/ American Heart Association: www.heart.org Academy of Nutrition and Dietetics: www.eatright.[place]: www.kidney.org Summary The DASH eating plan is a healthy eating plan that has been shown to reduce high blood pressure (hypertension). It may also reduce your risk for type 2 diabetes, heart disease, and stroke. When on the DASH eating plan, aim to eat more fresh fruits and vegetables, whole grains, lean proteins, low-fat dairy, and heart-healthy fats. With the DASH eating plan, you should limit salt (sodium) intake to 2,300 mg a day. If you have hypertension, you may need to reduce your sodium intake to 1,500 mg a day. Work with your health care provider or dietitian to adjust your eating plan to your individual calorie needs. This information is not intended to replace advice given to you by your health care provider. Make sure you discuss any questions you have with your health care provider. Document Revised: 05/29/2019 Document Reviewed: 05/29/2019 Elsevier Patient Education  [place].    If you have been instructed to have an in-person evaluation today at a local Urgent Care facility, please use the link below. It will take you to a list of all of our available [facility], including address, phone number and hours of operation. Please do not delay care.  [facility]  If you or a family member do not have a primary care provider, use the link below to schedule a visit and establish care. When you choose a  [facility] primary care physician or advanced practice provider, you gain a long-term partner in health. Find a Primary Care Provider  Learn more about [facility] in-office and virtual care options: [facility]

## 2022-03-20 NOTE — Telephone Encounter (Signed)
Medication Refill - Medication: insulin aspart (NOVOLOG FLEXPEN) 100 UNIT/ML FlexPen  Has the patient contacted their pharmacy? No. Mychart said he had no refills  Preferred Pharmacy (with phone number or street name[facility]  [id number] [place] [place] - [address] Phone:  [phone]  Fax:  [phone]     Has the patient been seen for an appointment in the last year OR does the patient have an upcoming appointment? Yes.    Agent: Please be advised that RX refills may take up to 3 business days. We ask that you follow-up with your pharmacy.

## 2022-03-20 NOTE — Progress Notes (Signed)
Virtual Visit Consent   [name], you are scheduled for a virtual visit with a [facility] provider today. Just as with appointments in the office, your consent must be obtained to participate. Your consent will be active for this visit and any virtual visit you may have with one of our providers in the next 365 days. If you have a MyChart account, a copy of this consent can be sent to you electronically.  As this is a virtual visit, video technology does not allow for your provider to perform a traditional examination. This may limit your provider's ability to fully assess your condition. If your provider identifies any concerns that need to be evaluated in person or the need to arrange testing (such as labs, EKG, etc.), we will make arrangements to do so. Although advances in technology are sophisticated, we cannot ensure that it will always work on either your end or our end. If the connection with a video visit is poor, the visit may have to be switched to a telephone visit. With either a video or telephone visit, we are not always able to ensure that we have a secure connection.  By engaging in this virtual visit, you consent to the provision of healthcare and authorize for your insurance to be billed (if applicable) for the services provided during this visit. Depending on your insurance coverage, you may receive a charge related to this service.  I need to obtain your verbal consent now. Are you willing to proceed with your visit today? [name] has provided verbal consent on 03/20/2022 for a virtual visit (video or telephone). [name], [place]  Date: 03/20/2022 2:57 PM  Virtual Visit via Video Note   I, [name], connected with  [name]  ([id number], Nov 06, 1975) on 03/20/22 at  2:30 PM EDT by a video-enabled telemedicine application and verified that I am speaking with the correct person using two identifiers.  Location: Patient: Virtual Visit Location  Patient: Home Provider: Virtual Visit Location Provider: Home Office   I discussed the limitations of evaluation and management by telemedicine and the availability of in person appointments. The patient expressed understanding and agreed to proceed.    History of Present Illness: [name] is a 46 y.o. who identifies as a male who was assigned male at birth, and is being seen today after being placed on the schedule by triage nurse for uncontrolled hypertension, elevated glucose and unexplainable weight loss in a patient with history of type 1 diabetes, chronic kidney disease, renovascular hypertension.  Patient endorses blood pressures have been consistently ranging in the 180s over 90s over the past several weeks.  [facility].  Has not had checked manually by pharmacist, nor does he note having a home blood pressure cuff.  Patient is currently on a regimen of benazepril 40 mg daily which she endorses taking as directed.  Also has amlodipine 10 mg daily on his medication list, but this is also listed as a drug allergy.  Patient verifies that he had substantial swelling while on the amlodipine, up to the level of his knees, but also experienced hives and itching.  As such was directed to stop amlodipine at recent ER visit.  Nothing was started in its place. Patient denies chest pain, palpitations, lightheadedness, dizziness, vision changes or frequent headaches.  BP Readings from Last 3 Encounters:  02/02/22 (!) 158/87  11/24/21 Marland Kitchen)  178/82  11/22/21 (!) 152/85   Patient also with history of type 1 diabetes mellitus with chronic kidney disease.  Is on a regimen of Basaglar and NovoLog.  States he recently got his Dexcom and has started using as directed.  Notes glucose averaging around 2 50-2 60 mark.  Is currently at 248, nonfasting.  Does note some occasional blurring of vision.  Denies symptoms of neuropathy or difficulty  urinating.  In terms of his insulin, he notes taking 14 units of Basaglar each morning and 12 units of Basaglar each evening.  Is not using his short acting insulin per sliding scale given by PCP.  Lab Results  Component Value Date   HGBA1C 10.0 (H) 11/17/2021   He is overdue for repeat A1C.  Per lab results, his baseline creatinine seems to be around 1.45-1.55.  On last check it ER visit a month ago, creatinine was at 2.08.  No recheck of creatinine since that time.  Patient notes he does have a follow-up with his PCP but is not until the middle of next month.  Was unable to get a sooner appointment.  HPI: HPI  Problems:  Patient Active Problem List   Diagnosis Date Noted   CKD (chronic kidney disease), stage III (HCC) 04/02/2019   Prolonged QT interval 04/02/2019   Microalbuminuria due to type 1 diabetes mellitus (HCC) 01/23/2017   Knee pain 03/16/2016   Scrotal pain 01/13/2016   Seizure disorder (HCC) 03/10/2015   Type 1 diabetes mellitus with hyperlipidemia (HCC) 03/10/2015   DIABETIC PERIPHERAL NEUROPATHY 07/10/2010   UNSPECIFIED VITAMIN D DEFICIENCY 06/16/2009   PTSD (post-traumatic stress disorder) 03/26/2008   Essential hypertension, benign 03/24/2008   SLEEP DISORDER 03/24/2008   SLEEP DEPRIVATION 02/09/2008   DM (diabetes mellitus), type 1 with complications (HCC) 04/04/2007    Allergies:  Allergies  Allergen Reactions   Geodon [Ziprasidone Hcl] Other (See Comments)    Severe muscle spasms (ENTIRE BODY)- had to call EMS   Ziprasidone Mesylate Other (See Comments)    "severe muscle spasms"- Had to call EMS   Amlodipine Swelling   Medications:  Current Outpatient Medications:    benazepril (LOTENSIN) 40 MG tablet, TAKE ONE TABLET BY MOUTH DAILY (Patient taking differently: Take 40 mg by mouth daily.), Disp: 90 tablet, Rfl: 1   Continuous Blood Gluc Receiver (FREESTYLE LIBRE 2 READER) DEVI, Use to check blood sugar three times daily. E10.8, Disp: 1 each, Rfl: 0    Continuous Blood Gluc Sensor (FREESTYLE LIBRE 2 SENSOR) MISC, Use to check blood sugar three times daily. Change sensor every 2 weeks. E10.8, Disp: 2 each, Rfl: 3   FLUoxetine (PROZAC) 10 MG capsule, Take 1 capsule (10 mg total) by mouth daily., Disp: 30 capsule, Rfl: 2   glucose blood test strip, Use as instructed. MUST MAKE APPT FOR FURTHER REFILLS, Disp: 100 each, Rfl: 0   ibuprofen (ADVIL) 800 MG tablet, Take 1 tablet (800 mg total) by mouth every 8 (eight) hours as needed. (Patient taking differently: Take 800 mg by mouth every 8 (eight) hours as needed for mild pain.), Disp: 60 tablet, Rfl: 1   insulin aspart (NOVOLOG FLEXPEN) 100 UNIT/ML FlexPen, Monitor blood glucose 3 times daily. For blood sugars 0-150 give 0 units of insulin, 151-200 give 2 units of insulin, 201-250 give 4 units, 251-300 give 6 units, 301-350 give 8 units, 351-400 give 10 units,> 400 give 12 units and call MD (Patient taking differently: Inject 2-10 Units into the skin 3 (three) times daily with  meals. Monitor blood glucose 3 times daily. For blood sugars 0-150 give 0 units of insulin, 151-200 give 2 units of insulin, 201-250 give 4 units, 251-300 give 6 units, 301-350 give 8 units, 351-400 give 10 units,> 400 give 12 units and call MD), Disp: 15 mL, Rfl: 0   Insulin Glargine (BASAGLAR KWIKPEN) 100 UNIT/ML, Inject 17 Units into the skin 2 (two) times daily. (Patient taking differently: Inject 10 Units into the skin 2 (two) times daily.), Disp: 15 mL, Rfl: 1   Insulin Pen Needle (B-D UF III MINI PEN NEEDLES) 31G X 5 MM MISC, Insert into the skin as prescribed, Disp: 90 each, Rfl: 1   methocarbamol (ROBAXIN) 500 MG tablet, Take 1 tablet (500 mg total) by mouth every 8 (eight) hours as needed for muscle spasms., Disp: 60 tablet, Rfl: 2   pantoprazole (PROTONIX) 40 MG tablet, Take 1 tablet (40 mg total) by mouth daily., Disp: 30 tablet, Rfl: 0   simvastatin (ZOCOR) 20 MG tablet, TAKE ONE TABLET BY MOUTH at bedtime (Patient taking  differently: Take 20 mg by mouth every evening.), Disp: 90 tablet, Rfl: 1   TRUEplus Lancets 28G MISC, use as directed 3 (three) times daily., Disp: 100 each, Rfl: 11  Observations/Objective: Patient is well-developed, well-nourished in no acute distress.  Resting comfortably at home.  Head is normocephalic, atraumatic.  No labored breathing. Speech is clear and coherent with logical content.  Patient is alert and oriented at baseline.   Assessment and Plan: 1. Type 1 DM with CKD stage 3 and hypertension (HCC)  Needs in person evaluation with PCP and likely referral to endocrinology and nephrology given uncontrolled type 1 diabetes with complications.  Unfortunately last creatinine was elevated above baseline without recheck of this at present.  As such we will forego adding on thiazide diuretic.  Unfortunately he cannot tolerate calcium channel blockers giving significant allergic reaction to amlodipine.  Continue benazepril at 40 mg for now.  We will add on metoprolol XL 25 mg once daily.  Recommend home BP checks daily.  We will try to send in as a prescription to get insurance coverage for this.  Direct message sent to his primary care provider to make her aware of the situation and see if she can facilitate a closer follow-up for him than what is currently scheduled.  We will have him increase Basaglar from 14 units a.m. and 12 units p.m. to 16 units a.m. and 14 units p.m.  He is to resume his NovoLog per the sliding scale giving to him by his primary care provider.  Discussed if he cannot get a follow-up within a week, I want him to be evaluated at one of our in person urgent care so he can get a manual BP recheck as well as repeat labs to assess current renal function.  Strict ER precautions reviewed with patient.  Follow Up Instructions: I discussed the assessment and treatment plan with the patient. The patient was provided an opportunity to ask questions and all were answered. The patient  agreed with the plan and demonstrated an understanding of the instructions.  A copy of instructions were sent to the patient via MyChart unless otherwise noted below.   The patient was advised to call back or seek an in-person evaluation if the symptoms worsen or if the condition fails to improve as anticipated.  Time:  I spent 18 minutes with the patient via telehealth technology discussing the above problems/concerns.    Piedad Climes, PA-C

## 2022-03-20 NOTE — Telephone Encounter (Signed)
FYI

## 2022-03-20 NOTE — Telephone Encounter (Signed)
   Chief Complaint: BP high, Glucose elevated, wt loss 15 lbs unintentionally Symptoms: bp 180 systolic Frequency: checks regularly Pertinent Negatives: Patient denies headache, chest pain Disposition: [] ED /[] Urgent Care (no appt availability in office) / [] Appointment(In office/virtual)/ [x]  [facility]/ [] Home Care/ [] Refused Recommended Disposition /[] [facility]/ []  Follow-up with PCP Additional Notes: [facility] visit scheduled. Pt has appt coming up in October, no sooner apppt available in person or virtually with [facility]. MyChart visit today.  Reason for Disposition  Systolic BP  >= 180 OR Diastolic >= 110  Answer Assessment - Initial Assessment Questions 1. BLOOD PRESSURE: "What is the blood pressure?" "Did you take at least two measurements 5 minutes apart?"     180/92, 180's always 2. ONSET: "When did you take your blood pressure?"     [facility] 3. HOW: "How did you take your blood pressure?" (e.g., automatic home BP monitor, visiting nurse)     cuff 4. HISTORY: "Do you have a history of high blood pressure?"     yes 5. MEDICINES: "Are you taking any medicines for blood pressure?" "Have you missed any doses recently?"     Taking bp med but does not work per pt 6. OTHER SYMPTOMS: "Do you have any symptoms?" (e.g., blurred vision, chest pain, difficulty breathing, headache, weakness)     Vision blurred 7. PREGNANCY: "Is there any chance you are pregnant?" "When was your last menstrual period?"     Na  Protocols used: Blood Pressure - High-A-AH

## 2022-03-21 MED ORDER — NOVOLOG FLEXPEN 100 UNIT/ML ~~LOC~~ SOPN: 2.0000 [IU] | PEN_INJECTOR | Freq: Three times a day (TID) | SUBCUTANEOUS | 0 refills | Status: DC

## 2022-03-21 NOTE — Telephone Encounter (Signed)
Requested Prescriptions  Pending Prescriptions Disp Refills  . insulin aspart (NOVOLOG FLEXPEN) 100 UNIT/ML FlexPen 15 mL     Sig: Inject 2-10 Units into the skin 3 (three) times daily with meals. Monitor blood glucose 3 times daily. For blood sugars 0-150 give 0 units of insulin, 151-200 give 2 units of insulin, 201-250 give 4 units, 251-300 give 6 units, 301-350 give 8 units, 351-400 give 10 units,> 400 give 12 units and call MD     Endocrinology:  Diabetes - Insulins Failed - 03/20/2022  3:37 PM      Failed - HBA1C is between 0 and 7.9 and within 180 days    HbA1c, POC (controlled diabetic range)  Date Value Ref Range Status  07/26/2021 9.4 (A) 0.0 - 7.0 % Final   Hgb A1c MFr Bld  Date Value Ref Range Status  11/17/2021 10.0 (H) 4.8 - 5.6 % Final    Comment:             Prediabetes: 5.7 - 6.4          Diabetes: >6.4          Glycemic control for adults with diabetes: <7.0          Passed - Valid encounter within last 6 months    Recent Outpatient Visits          3 months ago Essential hypertension, benign   [facility] [place], [place] W, NP   4 months ago DM (diabetes mellitus), type 1 with complications [facility])   [facility] [place], Shea Stakes, NP   4 months ago Essential hypertension   [facility] Lois Huxley, Cornelius Moras, RPH-CPP   5 months ago Tremor of both hands   [facility] Marcine Matar, MD   7 months ago DM (diabetes mellitus), type 1 with complications [facility])   [place] [facility] [place], Marzella Schlein, [place]      Future Appointments            In 3 weeks Sharon Seller, Marzella Schlein, PA-C [facility]

## 2022-03-23 ENCOUNTER — Other Ambulatory Visit: Payer: Self-pay | Admitting: Nurse Practitioner

## 2022-03-23 DIAGNOSIS — E108 Type 1 diabetes mellitus with unspecified complications: Secondary | ICD-10-CM

## 2022-03-23 NOTE — Telephone Encounter (Signed)
[name],  A gentle reminder that this patient does not want to see me and has requested a new provider. Please schedule him with another provider after you see him in October. He was supposed to see [place] and no showed after discharging me as his PCP.

## 2022-03-26 ENCOUNTER — Encounter: Payer: 59 | Admitting: Dietician

## 2022-03-26 ENCOUNTER — Encounter: Payer: Self-pay | Admitting: Nurse Practitioner

## 2022-03-27 ENCOUNTER — Ambulatory Visit: Payer: Medicaid Other | Admitting: Neurology

## 2022-03-29 NOTE — Telephone Encounter (Signed)
Called and rescheduled appointment with [facility] for new pcp/paperwork
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
## 2022-04-10 ENCOUNTER — Other Ambulatory Visit: Payer: Self-pay | Admitting: Nurse Practitioner

## 2022-04-10 DIAGNOSIS — E108 Type 1 diabetes mellitus with unspecified complications: Secondary | ICD-10-CM

## 2022-04-10 NOTE — Telephone Encounter (Signed)
Requested Prescriptions  Pending Prescriptions Disp Refills  . NOVOLOG FLEXPEN 100 UNIT/ML FlexPen [Pharmacy Med Name: NOVOLOG 100 UNIT/ML FLEXPEN]      Sig: INJECT 2-10 UNITS INTO THE SKIN 3 (THREE) TIMES DAILY WITH MEALS. MONITOR BLOOD GLUCOSE 3 TIMES DAILY. FOR BLOOD SUGARS 0-150 GIVE 0 UNITS OF INSULIN, 151-200 GIVE 2 UNITS OF INSULIN, 201-250 GIVE 4 UNITS, 251-300 GIVE 6 UNITS, 301-350 GIVE 8 UNITS, 351-400 GIVE 10 UNITS,> 400 GIVE 12 UNITS AND CALL MD     Endocrinology:  Diabetes - Insulins Failed - 04/10/2022  9:24 AM      Failed - HBA1C is between 0 and 7.9 and within 180 days    HbA1c, POC (controlled diabetic range)  Date Value Ref Range Status  07/26/2021 9.4 (A) 0.0 - 7.0 % Final   Hgb A1c MFr Bld  Date Value Ref Range Status  11/17/2021 10.0 (H) 4.8 - 5.6 % Final    Comment:             Prediabetes: 5.7 - 6.4          Diabetes: >6.4          Glycemic control for adults with diabetes: <7.0          Passed - Valid encounter within last 6 months    Recent Outpatient Visits          4 months ago Essential hypertension, benign   [place], [place] W, NP   4 months ago DM (diabetes mellitus), type 1 with complications [facility])   [place], Vernia Buff, NP   5 months ago Essential hypertension   [place], Jarome Matin, RPH-CPP   6 months ago Tremor of both hands   [place], Deborah B, MD   8 months ago DM (diabetes mellitus), type 1 with complications [facility])   [place], Dionne Bucy, [place]      Future Appointments            In 3 days Ladell Pier, MD [place]

## 2022-04-11 ENCOUNTER — Ambulatory Visit: Payer: 59 | Admitting: Neurology

## 2022-04-12 ENCOUNTER — Ambulatory Visit: Payer: Managed Care, Other (non HMO) | Admitting: Physician Assistant

## 2022-04-13 ENCOUNTER — Ambulatory Visit: Payer: 59 | Admitting: Internal Medicine

## 2022-04-17 ENCOUNTER — Ambulatory Visit: Payer: Managed Care, Other (non HMO) | Admitting: Dietician

## 2022-04-30 ENCOUNTER — Other Ambulatory Visit: Payer: Self-pay

## 2022-05-22 DIAGNOSIS — Z20828 Contact with and (suspected) exposure to other viral communicable diseases: Secondary | ICD-10-CM | POA: Diagnosis not present

## 2022-05-22 DIAGNOSIS — J00 Acute nasopharyngitis [common cold]: Secondary | ICD-10-CM | POA: Diagnosis not present

## 2022-05-22 DIAGNOSIS — I10 Essential (primary) hypertension: Secondary | ICD-10-CM | POA: Diagnosis not present

## 2022-05-22 DIAGNOSIS — Z6823 Body mass index (BMI) 23.0-23.9, adult: Secondary | ICD-10-CM | POA: Diagnosis not present

## 2022-06-04 ENCOUNTER — Emergency Department (HOSPITAL_BASED_OUTPATIENT_CLINIC_OR_DEPARTMENT_OTHER)
Admission: EM | Admit: 2022-06-04 | Discharge: 2022-06-04 | Disposition: A | Discharge status: Home / Self Care | Payer: 59 | Attending: Emergency Medicine | Admitting: Emergency Medicine

## 2022-06-04 ENCOUNTER — Other Ambulatory Visit: Payer: Self-pay

## 2022-06-04 ENCOUNTER — Ambulatory Visit (HOSPITAL_COMMUNITY)
Admission: EM | Admit: 2022-06-04 | Discharge: 2022-06-04 | Disposition: A | Discharge status: Home / Self Care | Payer: 59 | Attending: Physician Assistant | Admitting: Physician Assistant

## 2022-06-04 ENCOUNTER — Encounter (HOSPITAL_COMMUNITY): Payer: Self-pay

## 2022-06-04 ENCOUNTER — Emergency Department (HOSPITAL_BASED_OUTPATIENT_CLINIC_OR_DEPARTMENT_OTHER): Payer: 59 | Admitting: Radiology

## 2022-06-04 ENCOUNTER — Encounter (HOSPITAL_BASED_OUTPATIENT_CLINIC_OR_DEPARTMENT_OTHER): Payer: Self-pay | Admitting: Emergency Medicine

## 2022-06-04 DIAGNOSIS — Z79899 Other long term (current) drug therapy: Secondary | ICD-10-CM | POA: Insufficient documentation

## 2022-06-04 DIAGNOSIS — I10 Essential (primary) hypertension: Secondary | ICD-10-CM | POA: Diagnosis not present

## 2022-06-04 DIAGNOSIS — R9431 Abnormal electrocardiogram [ECG] [EKG]: Secondary | ICD-10-CM | POA: Diagnosis not present

## 2022-06-04 DIAGNOSIS — E109 Type 1 diabetes mellitus without complications: Secondary | ICD-10-CM | POA: Diagnosis not present

## 2022-06-04 DIAGNOSIS — N183 Chronic kidney disease, stage 3 unspecified: Secondary | ICD-10-CM | POA: Insufficient documentation

## 2022-06-04 DIAGNOSIS — R079 Chest pain, unspecified: Secondary | ICD-10-CM

## 2022-06-04 DIAGNOSIS — B029 Zoster without complications: Secondary | ICD-10-CM | POA: Insufficient documentation

## 2022-06-04 DIAGNOSIS — R0602 Shortness of breath: Secondary | ICD-10-CM | POA: Diagnosis not present

## 2022-06-04 DIAGNOSIS — Z794 Long term (current) use of insulin: Secondary | ICD-10-CM | POA: Diagnosis not present

## 2022-06-04 DIAGNOSIS — I129 Hypertensive chronic kidney disease with stage 1 through stage 4 chronic kidney disease, or unspecified chronic kidney disease: Secondary | ICD-10-CM | POA: Insufficient documentation

## 2022-06-04 DIAGNOSIS — I161 Hypertensive emergency: Secondary | ICD-10-CM

## 2022-06-04 DIAGNOSIS — R0789 Other chest pain: Secondary | ICD-10-CM | POA: Diagnosis not present

## 2022-06-04 LAB — BASIC METABOLIC PANEL
Anion gap: 5 (ref 5–15)
BUN: 17 mg/dL (ref 6–20)
CO2: 32 mmol/L (ref 22–32)
Calcium: 9.2 mg/dL (ref 8.9–10.3)
Chloride: 99 mmol/L (ref 98–111)
Creatinine, Ser: 1.47 mg/dL — ABNORMAL HIGH (ref 0.61–1.24)
GFR, Estimated: 60 mL/min — ABNORMAL LOW (ref >60)
Glucose, Bld: 128 mg/dL — ABNORMAL HIGH (ref 70–99)
Potassium: 4.1 mmol/L (ref 3.5–5.1)
Sodium: 136 mmol/L (ref 135–145)

## 2022-06-04 LAB — CBC
HCT: 43.7 % (ref 39.0–52.0)
Hemoglobin: 14.7 g/dL (ref 13.0–17.0)
MCH: 28.7 pg (ref 26.0–34.0)
MCHC: 33.6 g/dL (ref 30.0–36.0)
MCV: 85.2 fL (ref 80.0–100.0)
Platelets: 264 10*3/uL (ref 150–400)
RBC: 5.13 MIL/uL (ref 4.22–5.81)
RDW: 13.2 % (ref 11.5–15.5)
WBC: 6.6 10*3/uL (ref 4.0–10.5)
nRBC: 0 % (ref 0.0–0.2)

## 2022-06-04 LAB — TROPONIN I (HIGH SENSITIVITY)
Troponin I (High Sensitivity): 4 ng/L (ref <18)
Troponin I (High Sensitivity): 5 ng/L (ref <18)

## 2022-06-04 LAB — CBG MONITORING, ED: Glucose-Capillary: 107 mg/dL — ABNORMAL HIGH (ref 70–99)

## 2022-06-04 MED ORDER — OXYCODONE HCL 5 MG PO TABS: 5.0000 mg | ORAL_TABLET | Freq: Four times a day (QID) | ORAL | 0 refills | Status: DC | PRN

## 2022-06-04 MED ORDER — GABAPENTIN 100 MG PO CAPS: 100.0000 mg | ORAL_CAPSULE | Freq: Three times a day (TID) | ORAL | 0 refills | Status: DC | PRN

## 2022-06-04 MED ORDER — OXYCODONE HCL 5 MG PO TABS
5.0000 mg | ORAL_TABLET | Freq: Once | ORAL | Status: AC
  Administered 2022-06-04: 5 mg via ORAL
  Filled 2022-06-04: qty 1

## 2022-06-04 MED ORDER — LISINOPRIL 10 MG PO TABS
40.0000 mg | ORAL_TABLET | Freq: Once | ORAL | Status: AC
  Administered 2022-06-04: 40 mg via ORAL
  Filled 2022-06-04: qty 4

## 2022-06-04 MED ORDER — BENAZEPRIL HCL 40 MG PO TABS
40.0000 mg | ORAL_TABLET | Freq: Once | ORAL | Status: DC
  Filled 2022-06-04: qty 1

## 2022-06-04 MED ORDER — VALACYCLOVIR HCL 1 G PO TABS: 1000.0000 mg | ORAL_TABLET | Freq: Two times a day (BID) | ORAL | 0 refills | Status: DC

## 2022-06-04 MED ORDER — VALACYCLOVIR HCL 1 G PO TABS: 1000.0000 mg | ORAL_TABLET | Freq: Two times a day (BID) | ORAL | 0 refills | Status: AC

## 2022-06-04 MED ORDER — METOPROLOL SUCCINATE ER 25 MG PO TB24
25.0000 mg | ORAL_TABLET | Freq: Once | ORAL | Status: AC
  Administered 2022-06-04: 25 mg via ORAL
  Filled 2022-06-04: qty 1

## 2022-06-04 MED ORDER — VALACYCLOVIR HCL 500 MG PO TABS
1000.0000 mg | ORAL_TABLET | Freq: Once | ORAL | Status: AC
  Administered 2022-06-04: 1000 mg via ORAL
  Filled 2022-06-04: qty 2

## 2022-06-04 NOTE — ED Notes (Signed)
Pt states that he no longer has a headache and that he feels better

## 2022-06-04 NOTE — ED Triage Notes (Signed)
Seen at [facility] for rash. Dx shingles BP high at [facility] referred for eval 225/96? Also diabetic and concerned about blood sugar

## 2022-06-04 NOTE — ED Notes (Signed)
Patient is being discharged from the Urgent Care and sent to the Emergency Department via pov . Per erin raspet, patient is in need of higher level of care due to higher level of care. Patient is aware and verbalizes understanding of plan of care.  Vitals:   06/04/22 1210 06/04/22 1225  BP: (!) 225/97 (!) 214/96  Pulse: 62   Resp: 16   Temp: 98.1 F (36.7 C)   SpO2: 97%

## 2022-06-04 NOTE — ED Triage Notes (Signed)
Pt is here for a rash on chest , pain in the left arm and back xfew days

## 2022-06-04 NOTE — ED Provider Notes (Signed)
[facility]    CSN: [id number] Arrival date & time: 06/04/22  1157      History   Chief Complaint Chief Complaint  Patient presents with   Herpes Zoster    HPI [name] is a 46 y.o. male.   Patient presents today with a 6-day history of intermittent chest discomfort and shortness of breath.  Reports that pain is rated 10 on a 0-10 pain scale, described as tightness/difficulty breathing, no aggravating or alleviating factors identified.  Over the past several days he has developed a rash on his left trunk (chest wall and back) he reports associated pain to the point that he is having difficulty laying down at night because it hurts his skin.  He does have a history of diabetes and reports that his blood sugar has been elevated with last A1c 10.0% in May 2023.  Denies history of cardiovascular disease but does have several risk factors including diabetes and hypertension.  Denies family history of cardiovascular disease.  He has not tried any over-the-counter medication for symptom management.  Reports pain is intermittent without identifiable trigger.    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) 01/23/2017   Knee pain 03/16/2016   Scrotal pain 01/13/2016   Seizure disorder (HCC) 03/10/2015   Type 1 diabetes mellitus with hyperlipidemia (HCC) 03/10/2015   DIABETIC PERIPHERAL NEUROPATHY 07/10/2010   UNSPECIFIED VITAMIN D DEFICIENCY 06/16/2009   PTSD (post-traumatic stress disorder) 03/26/2008   Essential hypertension, benign 03/24/2008   SLEEP DISORDER 03/24/2008   SLEEP DEPRIVATION 02/09/2008   DM (diabetes mellitus), type 1 with complications (HCC) 04/04/2007    Past Surgical History:  Procedure  Laterality Date   HERNIA REPAIR         Home Medications    Prior to Admission medications   Medication Sig Start Date End Date Taking? Authorizing Provider  benazepril (LOTENSIN) 40 MG tablet TAKE ONE TABLET BY MOUTH DAILY Patient taking differently: Take 40 mg by mouth daily. 10/11/21   Hoy Register, MD  Blood Pressure Monitoring ([employer] ADVANCED BP MONITOR) DEVI 1 Device by Does not apply route daily. 03/20/22   Waldon Merl, PA-C  Continuous Blood Gluc Receiver (FREESTYLE LIBRE 2 READER) DEVI Use to check blood sugar three times daily. E10.8 03/07/22   Claiborne Rigg, NP  Continuous Blood Gluc Sensor (FREESTYLE LIBRE 2 SENSOR) MISC Use to check blood sugar three times daily. Change sensor every 2 weeks. E10.8 03/07/22   Hoy Register, MD  FLUoxetine (PROZAC) 10 MG capsule Take 1 capsule (10 mg total) by mouth daily. 02/02/22 02/02/23  Ajibola, Ene A, NP  glucose blood test strip Use as instructed. MUST MAKE APPT FOR FURTHER REFILLS 07/26/21   [name], PA-C  ibuprofen (ADVIL) 800 MG tablet Take 1 tablet (800 mg total) by mouth every 8 (eight) hours as needed. Patient taking differently: Take 800 mg by mouth every 8 (eight) hours as needed for mild pain. 02/22/21   Claiborne Rigg, NP  insulin aspart (NOVOLOG FLEXPEN) 100 UNIT/ML FlexPen Inject 2-10 Units into the skin 3 (three) times daily with meals. Monitor blood glucose 3 times daily. For blood sugars 0-150 give 0 units of insulin, 151-200  give 2 units of insulin, 201-250 give 4 units, 251-300 give 6 units, 301-350 give 8 units, 351-400 give 10 units,> 400 give 12 units and call MD 03/21/22   Gildardo Pounds, NP  Insulin Glargine (BASAGLAR KWIKPEN) 100 UNIT/ML Inject 17 Units into the skin 2 (two) times daily. Patient taking differently: Inject 10 Units into the skin 2 (two) times daily. 12/26/21   Charlott Rakes, MD  Insulin Pen Needle (B-D UF III MINI PEN NEEDLES) 31G X 5 MM MISC Insert into the skin as prescribed 07/26/21    Argentina Donovan, PA-C  methocarbamol (ROBAXIN) 500 MG tablet Take 1 tablet (500 mg total) by mouth every 8 (eight) hours as needed for muscle spasms. 02/22/21   Gildardo Pounds, NP  metoprolol succinate (TOPROL-XL) 25 MG 24 hr tablet Take 1 tablet (25 mg total) by mouth daily. 03/20/22   Brunetta Jeans, PA-C  pantoprazole (PROTONIX) 40 MG tablet Take 1 tablet (40 mg total) by mouth daily. 02/01/22   Hayden Rasmussen, MD  simvastatin (ZOCOR) 20 MG tablet TAKE ONE TABLET BY MOUTH at bedtime Patient taking differently: Take 20 mg by mouth every evening. 10/11/21   Charlott Rakes, MD  TRUEplus Lancets 28G MISC use as directed 3 (three) times daily. 07/26/21   Argentina Donovan, PA-C  insulin lispro (HUMALOG KWIKPEN) 100 UNIT/ML KwikPen Monitor blood glucose TID. For blood sugars 0-150 give 0 units of insulin, 151-200 give 2 units of insulin, 201-250 give 4 units, 251-300 give 6 units, 301-350 give 8 units, 351-400 give 10 units,> 400 give 12 units and call M.D. Discussed hypoglycemia protocol. 03/22/20 04/08/20  Gildardo Pounds, NP    Family History Family History  Problem Relation Age of Onset   Hypertension Mother    Diabetes Paternal Uncle     Social History Social History   Tobacco Use   Smoking status: Never   Smokeless tobacco: Never  Vaping Use   Vaping Use: Never used  Substance Use Topics   Alcohol use: Not Currently    Alcohol/week: 1.0 standard drink of alcohol    Types: 1 Glasses of wine per week    Comment: rarely   Drug use: No     Allergies   Geodon [ziprasidone hcl], Ziprasidone mesylate, and Amlodipine   Review of Systems Review of Systems  Constitutional:  Positive for activity change. Negative for appetite change, fatigue and fever.  Respiratory:  Positive for chest tightness and shortness of breath. Negative for cough.   Cardiovascular:  Positive for chest pain.  Gastrointestinal:  Negative for abdominal pain, diarrhea, nausea and vomiting.   Musculoskeletal:  Negative for arthralgias and myalgias.  Skin:  Positive for rash.     Physical Exam Triage Vital Signs ED Triage Vitals [06/04/22 1207]  Enc Vitals Group     BP      Pulse      Resp      Temp      Temp src      SpO2      Weight      Height      Head Circumference      Peak Flow      Pain Score 10     Pain Loc      Pain Edu?      Excl. in [place]?    No data found.  Updated Vital Signs BP (!) 214/96 (BP Location: Right Arm)   Pulse 62   Temp 98.1 F (36.7 C) (Oral)  Resp 16   SpO2 97%   Visual Acuity Right Eye Distance:   Left Eye Distance:   Bilateral Distance:    Right Eye Near:   Left Eye Near:    Bilateral Near:     Physical Exam Vitals reviewed.  Constitutional:      General: He is awake.     Appearance: Normal appearance. He is well-developed. He is not ill-appearing.     Comments: Very pleasant male appears stated age in no acute distress sitting comfortably in exam room  HENT:     Head: Normocephalic and atraumatic.  Cardiovascular:     Rate and Rhythm: Normal rate and regular rhythm.     Heart sounds: Normal heart sounds, S1 normal and S2 normal. No murmur heard. Pulmonary:     Effort: Pulmonary effort is normal.     Breath sounds: Normal breath sounds. No stridor. No wheezing, rhonchi or rales.     Comments: Clear to auscultation bilaterally Abdominal:     General: Bowel sounds are normal.     Palpations: Abdomen is soft.     Tenderness: There is no abdominal tenderness. There is no right CVA tenderness, left CVA tenderness, guarding or rebound.  Skin:    Findings: Rash present. Rash is vesicular.     Comments: Vesicular rash in dermatomal pattern noted left anterior chest and thoracic back.  Neurological:     Mental Status: He is alert.  Psychiatric:        Behavior: Behavior is cooperative.      UC Treatments / Results  Labs (all labs ordered are listed, but only abnormal results are displayed) Labs Reviewed - No  data to display  EKG   Radiology No results found.  Procedures Procedures (including critical care time)  Medications Ordered in UC Medications - No data to display  Initial Impression / Assessment and Plan / UC Course  I have reviewed the triage vital signs and the nursing notes.  Pertinent labs & imaging results that were available during my care of the patient were reviewed by me and considered in my medical decision making (see chart for details).     Patient was significantly hypertensive on intake with blood pressure of 225/97 and recheck of 214/96.  He reported several days of intermittent chest pain and EKG was obtained that showed nonspecific elevation in V2 and V3 without reciprocal changes but different compared to 02/01/2022 tracing.  Discussed that given concern for hypertensive emergency with active chest pain and elevated blood pressure he would need to go to the emergency room for further evaluation and management.  He was agreeable and will go directly to [facility], ER.  Offered EMS but he declined and will have person who took him to urgent care take him down to the ER.  I do believe that shingles could be contributing to his pain but he needs complete cardiac workup which we do not have available in urgent care.  Patient was stable at the time of discharge.  Final Clinical Impressions(s) / UC Diagnoses   Final diagnoses:  Hypertensive emergency  Chest pain, unspecified type  Shortness of breath  Nonspecific abnormal electrocardiogram (ECG) (EKG)   Discharge Instructions   None    ED Prescriptions   None    PDMP not reviewed this encounter.   Terrilee Croak, PA-C 06/04/22 1234

## 2022-06-04 NOTE — Discharge Instructions (Addendum)
You were seen in the emergency department today for elevated blood pressure and shingles.  Your exam/workup today does not appear to show any abnormalities that would require admission. It is very important that you take all of your medications as prescribed, every day.   Take the Valtrex as prescribed to treat the shingles.  You can take the oxycodone as needed for severe pain but do not take while drinking alcohol or driving a car.  You can also take the gabapentin as needed for nerve pain associated with the shingles with Tylenol.  Please follow up with your doctor in the next 2-3 days to discuss your elevated blood pressures and your current medication regimen.  Return to the emergency department if you develop any sudden/severe headache, confusion, slurred speech, worsening vision, weakness or numbness of any arm or leg, or for any chest pain, pressure, or trouble breathing.

## 2022-06-04 NOTE — ED Notes (Signed)
Pt agreeable with d/c plan as discussed by provider- this nurse has verbally reinforced d/c instructions and provided pt with written copy.  Pt acknowledges verbal understanding and denies any addl questions concerns needs- pt ambulatory independently with steady gait; vitals at baseline; no distress or acute changes

## 2022-06-04 NOTE — ED Notes (Signed)
Pt awake and alert lying in bed- GCS 15.  RR even and unlabored on RA with symmetrical rise and fall of chest.  Continuous cardiac and pulse ox maintained with Q75min bp checks.  Pt remains hypertensive however denies active HA which was reported earlier - denies cp/sob/dizziness/weakness.  Shingles outbreak to anterior L chest wall distal to L breast region extending to the back otherwise skin warm dry and intact.  Pt now awaits repeat trop results.  Will continue to monitor and maintain plan of care as pt awaits test results and dispo.

## 2022-06-04 NOTE — ED Provider Notes (Signed)
[facility] EMERGENCY DEPT Provider Note   CSN: [id number] Arrival date & time: 06/04/22  1321     History  No chief complaint on file.   [name] is a 46 y.o. male.  With PMH of DM1, HTN, CKD stage III who presents from urgent care for concern for chest pain in the setting of high blood pressure but found to have new shingles.  Patient said he went to the urgent care because of his new rash that is wrapping from his left chest wall around to his back that is painful and skin very tender.  That is what is causing his pain.  He has never had this rash before but it is severely tender to the touch and at rest.  He is currently not on any antiviral medication or treatment for shingles.  He has had no recent fevers or illness.  No cough.  He has not taken his blood pressure medications for the past 2 days because he generally just does not like the way medicines make him feel or taking too many medications at once. No history of heart attack or stroke.  HPI     Home Medications Prior to Admission medications   Medication Sig Start Date End Date Taking? Authorizing Provider  gabapentin (NEURONTIN) 100 MG capsule Take 1 capsule (100 mg total) by mouth 3 (three) times daily as needed for up to 20 days (pain). 06/04/22 06/24/22 Yes Mardene Sayer, MD  oxyCODONE (ROXICODONE) 5 MG immediate release tablet Take 1 tablet (5 mg total) by mouth every 6 (six) hours as needed for up to 10 doses for severe pain. 06/04/22  Yes Mardene Sayer, MD  valACYclovir (VALTREX) 1000 MG tablet Take 1 tablet (1,000 mg total) by mouth 2 (two) times daily for 7 days. 06/04/22 06/11/22 Yes Mardene Sayer, MD  benazepril (LOTENSIN) 40 MG tablet TAKE ONE TABLET BY MOUTH DAILY Patient taking differently: Take 40 mg by mouth daily. 10/11/21   Hoy Register, MD  Blood Pressure Monitoring ([employer] ADVANCED BP MONITOR) DEVI 1 Device by Does not apply route daily. 03/20/22   Waldon Merl, PA-C   Continuous Blood Gluc Receiver (FREESTYLE LIBRE 2 READER) DEVI Use to check blood sugar three times daily. E10.8 03/07/22   Claiborne Rigg, NP  Continuous Blood Gluc Sensor (FREESTYLE LIBRE 2 SENSOR) MISC Use to check blood sugar three times daily. Change sensor every 2 weeks. E10.8 03/07/22   Hoy Register, MD  FLUoxetine (PROZAC) 10 MG capsule Take 1 capsule (10 mg total) by mouth daily. 02/02/22 02/02/23  Ajibola, Ene A, NP  glucose blood test strip Use as instructed. MUST MAKE APPT FOR FURTHER REFILLS 07/26/21   [name], PA-C  ibuprofen (ADVIL) 800 MG tablet Take 1 tablet (800 mg total) by mouth every 8 (eight) hours as needed. Patient taking differently: Take 800 mg by mouth every 8 (eight) hours as needed for mild pain. 02/22/21   Claiborne Rigg, NP  insulin aspart (NOVOLOG FLEXPEN) 100 UNIT/ML FlexPen Inject 2-10 Units into the skin 3 (three) times daily with meals. Monitor blood glucose 3 times daily. For blood sugars 0-150 give 0 units of insulin, 151-200 give 2 units of insulin, 201-250 give 4 units, 251-300 give 6 units, 301-350 give 8 units, 351-400 give 10 units,> 400 give 12 units and call MD 03/21/22   Claiborne Rigg, NP  Insulin Glargine (BASAGLAR KWIKPEN) 100 UNIT/ML Inject 17 Units into the skin 2 (two) times daily.  Patient taking differently: Inject 10 Units into the skin 2 (two) times daily. 12/26/21   Hoy Register, MD  Insulin Pen Needle (B-D UF III MINI PEN NEEDLES) 31G X 5 MM MISC Insert into the skin as prescribed 07/26/21   [name], PA-C  methocarbamol (ROBAXIN) 500 MG tablet Take 1 tablet (500 mg total) by mouth every 8 (eight) hours as needed for muscle spasms. 02/22/21   Claiborne Rigg, NP  metoprolol succinate (TOPROL-XL) 25 MG 24 hr tablet Take 1 tablet (25 mg total) by mouth daily. 03/20/22   Waldon Merl, PA-C  pantoprazole (PROTONIX) 40 MG tablet Take 1 tablet (40 mg total) by mouth daily. 02/01/22   Terrilee Files, MD  simvastatin (ZOCOR)  20 MG tablet TAKE ONE TABLET BY MOUTH at bedtime Patient taking differently: Take 20 mg by mouth every evening. 10/11/21   Hoy Register, MD  TRUEplus Lancets 28G MISC use as directed 3 (three) times daily. 07/26/21   [name], PA-C  insulin lispro (HUMALOG KWIKPEN) 100 UNIT/ML KwikPen Monitor blood glucose TID. For blood sugars 0-150 give 0 units of insulin, 151-200 give 2 units of insulin, 201-250 give 4 units, 251-300 give 6 units, 301-350 give 8 units, 351-400 give 10 units,> 400 give 12 units and call M.D. Discussed hypoglycemia protocol. 03/22/20 04/08/20  Claiborne Rigg, NP      Allergies    Geodon [ziprasidone hcl], Ziprasidone mesylate, and Amlodipine    Review of Systems   Review of Systems  Physical Exam Updated Vital Signs BP (!) 196/85   Pulse 60   Temp 99.1 F (37.3 C) (Oral)   Resp 14   Ht 5\' 10"  (1.778 m)   Wt 77.1 kg   SpO2 98%   BMI 24.39 kg/m  Physical Exam Constitutional: Alert and oriented.  No acute distress and nontoxic Eyes: Conjunctivae are normal. ENT      Head: Normocephalic and atraumatic.      Nose: No congestion.      Mouth/Throat: Mucous membranes are moist.      Neck: No stridor. Cardiovascular: S1, S2,  Normal and symmetric distal pulses are present in all extremities.Warm and well perfused. Respiratory: Normal respiratory effort. Breath sounds are normal.  O2 sat 100 on RA Gastrointestinal: Soft and nondistended  musculoskeletal: Normal range of motion in all extremities. Neurologic: Normal speech and language.  Moving all extremities equally.  Sensation grossly intact.  No facial droop.  Steady ambulatory gait.  No gross focal neurologic deficits are appreciated. Skin: Vesicular nonerythematous lesions extending from left T5-T6 region from the back to the chest not crossing midline Psychiatric: Mood and affect are normal. Speech and behavior are normal.  ED Results / Procedures / Treatments   Labs (all labs ordered are listed, but  only abnormal results are displayed) Labs Reviewed  BASIC METABOLIC PANEL - Abnormal; Notable for the following components:      Result Value   Glucose, Bld 128 (*)    Creatinine, Ser 1.47 (*)    GFR, Estimated 60 (*)    All other components within normal limits  CBG MONITORING, ED - Abnormal; Notable for the following components:   Glucose-Capillary 107 (*)    All other components within normal limits  CBC  TROPONIN I (HIGH SENSITIVITY)  TROPONIN I (HIGH SENSITIVITY)    EKG None  Radiology DG Chest 2 View  Result Date: 06/04/2022 CLINICAL DATA:  Chest pain with some shortness of breath with deep inspiration. Elevated blood  pressure. EXAM: CHEST - 2 VIEW COMPARISON:  Radiographs 02/01/2022 and 03/21/2019. FINDINGS: The heart size and mediastinal contours are normal. The lungs are clear. There is no pleural effusion or pneumothorax. No acute osseous findings are identified. IMPRESSION: Stable chest.  No evidence of active cardiopulmonary process. Electronically Signed   By: Carey Bullocks M.D.   On: 06/04/2022 14:10    Procedures Procedures   Medications Ordered in ED Medications  valACYclovir (VALTREX) tablet 1,000 mg (1,000 mg Oral Given 06/04/22 1818)  oxyCODONE (Oxy IR/ROXICODONE) immediate release tablet 5 mg (5 mg Oral Given 06/04/22 1819)  metoprolol succinate (TOPROL-XL) 24 hr tablet 25 mg (25 mg Oral Given 06/04/22 1818)  lisinopril (ZESTRIL) tablet 40 mg (40 mg Oral Given 06/04/22 1818)    ED Course/ Medical Decision Making/ A&P                           Medical Decision Making Patient's physical exam shows evidence of shingles in the T5-T6 dermatome which is the location of his chest pain.  I am not concerned for ACS or cardiac etiology of his chest pain, his EKG does have nonspecific T wave changes but this is similar to prior as well as evidence of LVH likely due to uncontrolled blood pressure in the setting of noncompliance with medications.  His troponin was  reassuring at 4 and rpt 5.  His creatinine 1.47 down trended from baseline.  His chest x-ray which I personally reviewed showed no consolidation concerning for pneumonia, no pneumothorax, no pulmonary edema.  Regarding his blood pressure it is likely secondary to pain from shingles and noncompliance.  He was given his home blood pressure medications and pain control with shingles treatment with improvement .  He had no evidence of progressive target organ dysfunction to suggest hypertensive emergency and acute lowering of blood pressure at this point.  Specifically,   - No evidence of acute myocardial ischemia based on EKG, troponin,  - No evidence of acute renal failure based on improved Cr from baseline 1.47 - No evidence of pulmonary edema based on pulmonary exam  and chest XR - No evidence of encephalopathy based on normal and stable mental status - No evidence for [facility], ICH, or other CVA based on normal neurologic exam - No evidence for aortic dissection based absent chest pain, normal EKG and CXR, symmetric radial pulses  Will manage as asymptomatic hypertensive urgency in setting of pain 2/2 shingles and anti hypertensive noncompliance.  Advise close follow-up with PCP regarding blood pressure regimen.  Discharged with 7 days of Valtrex renally dosed as well as as needed oxycodone and gabapentin for pain control.  Strict return precaution discussed.  He is safe for discharge.  Amount and/or Complexity of Data Reviewed Labs: ordered. Radiology: ordered.  Risk Prescription drug management.    Final Clinical Impression(s) / ED Diagnoses Final diagnoses:  Hypertension, unspecified type  Herpes zoster without complication    Rx / DC Orders ED Discharge Orders          Ordered    valACYclovir (VALTREX) 1000 MG tablet  2 times daily        06/04/22 2019    oxyCODONE (ROXICODONE) 5 MG immediate release tablet  Every 6 hours PRN        06/04/22 2019    gabapentin (NEURONTIN) 100 MG  capsule  3 times daily PRN        06/04/22 2019  Mardene SayerBranham, Marabelle Cushman C, MD 06/04/22 2022

## 2022-06-22 ENCOUNTER — Encounter (HOSPITAL_COMMUNITY): Payer: Self-pay

## 2022-06-22 ENCOUNTER — Emergency Department (HOSPITAL_COMMUNITY)
Admission: EM | Admit: 2022-06-22 | Discharge: 2022-06-22 | Disposition: A | Discharge status: Home / Self Care | Payer: 59 | Attending: Emergency Medicine | Admitting: Emergency Medicine

## 2022-06-22 ENCOUNTER — Ambulatory Visit: Payer: Self-pay | Admitting: *Deleted

## 2022-06-22 ENCOUNTER — Other Ambulatory Visit: Payer: Self-pay

## 2022-06-22 DIAGNOSIS — Z79899 Other long term (current) drug therapy: Secondary | ICD-10-CM | POA: Diagnosis not present

## 2022-06-22 DIAGNOSIS — B0229 Other postherpetic nervous system involvement: Secondary | ICD-10-CM

## 2022-06-22 DIAGNOSIS — R0789 Other chest pain: Secondary | ICD-10-CM | POA: Diagnosis not present

## 2022-06-22 DIAGNOSIS — B028 Zoster with other complications: Secondary | ICD-10-CM | POA: Diagnosis not present

## 2022-06-22 DIAGNOSIS — I10 Essential (primary) hypertension: Secondary | ICD-10-CM | POA: Insufficient documentation

## 2022-06-22 DIAGNOSIS — E119 Type 2 diabetes mellitus without complications: Secondary | ICD-10-CM | POA: Diagnosis not present

## 2022-06-22 DIAGNOSIS — R079 Chest pain, unspecified: Secondary | ICD-10-CM | POA: Diagnosis not present

## 2022-06-22 DIAGNOSIS — Z794 Long term (current) use of insulin: Secondary | ICD-10-CM | POA: Insufficient documentation

## 2022-06-22 MED ORDER — TRIAMCINOLONE ACETONIDE 0.1 % EX CREA: 1.0000 | TOPICAL_CREAM | Freq: Two times a day (BID) | CUTANEOUS | 0 refills | Status: DC

## 2022-06-22 NOTE — ED Triage Notes (Signed)
Pt came in via POV d/t intermittent CP that started about 2-3 weeks ago after being treated for shingles. When the CP occurs it feels like a "poking or stabbing" in the chest & then it will sometimes radiate in to his Left arm. Plus the area on his Torso with the shingles will feel numb. A/Ox4. Rates pain 8/10 at this time.

## 2022-06-22 NOTE — Discharge Instructions (Signed)
Please follow-up with your primary care doctor, you may want to discuss increasing your dosage of gabapentin if you continue having symptoms, the rash should improve over time as well as the neuropathic pain, if you have significant changing or worsening pain despite treatment as above I recommend returning for further evaluation and workup

## 2022-06-22 NOTE — Telephone Encounter (Signed)
Patient evaluated in ED today.

## 2022-06-22 NOTE — ED Provider Notes (Signed)
[facility] EMERGENCY DEPARTMENT Provider Note   CSN: [id number] Arrival date & time: 06/22/22  1531     History  Chief Complaint  Patient presents with   Chest Pain    [name] is a 46 y.o. male with past medical history significant for diabetes, hypertension, seizure disorder, with recent diagnosis of shingles around 2 weeks ago who presents with concern for ongoing chest pain, chest discomfort, tingling sensation.  Patient continues to have some shingles rash changes although he does report that it seems to be healing.  Patient was sent for further evaluation by his primary care doctor due to elevated blood pressure and ongoing chest pain, however patient reports that the pain is limited to the shingles rash distribution it does not deviate, and is responding to home gabapentin and other medications although he is frustrated by length of pain he is experiencing.   Chest Pain      Home Medications Prior to Admission medications   Medication Sig Start Date End Date Taking? Authorizing Provider  triamcinolone cream (KENALOG) 0.1 % Apply 1 Application topically 2 (two) times daily. Do not use for longer than 2 weeks in a row in any 1 location 06/22/22  Yes Jezel Basto H, PA-C  benazepril (LOTENSIN) 40 MG tablet TAKE ONE TABLET BY MOUTH DAILY Patient taking differently: Take 40 mg by mouth daily. 10/11/21   Hoy Register, MD  Blood Pressure Monitoring ([employer] ADVANCED BP MONITOR) DEVI 1 Device by Does not apply route daily. 03/20/22   Waldon Merl, PA-C  Continuous Blood Gluc Receiver (FREESTYLE LIBRE 2 READER) DEVI Use to check blood sugar three times daily. E10.8 03/07/22   Claiborne Rigg, NP  Continuous Blood Gluc Sensor (FREESTYLE LIBRE 2 SENSOR) MISC Use to check blood sugar three times daily. Change sensor every 2 weeks. E10.8 03/07/22   Hoy Register, MD  FLUoxetine (PROZAC) 10 MG capsule Take 1 capsule (10 mg total) by mouth daily. 02/02/22  02/02/23  [place], Gerrianne Scale A, NP  gabapentin (NEURONTIN) 100 MG capsule Take 1 capsule (100 mg total) by mouth 3 (three) times daily as needed for up to 20 days (pain). 06/04/22 06/24/22  Mardene Sayer, MD  glucose blood test strip Use as instructed. MUST MAKE APPT FOR FURTHER REFILLS 07/26/21   [name], PA-C  ibuprofen (ADVIL) 800 MG tablet Take 1 tablet (800 mg total) by mouth every 8 (eight) hours as needed. Patient taking differently: Take 800 mg by mouth every 8 (eight) hours as needed for mild pain. 02/22/21   Claiborne Rigg, NP  insulin aspart (NOVOLOG FLEXPEN) 100 UNIT/ML FlexPen Inject 2-10 Units into the skin 3 (three) times daily with meals. Monitor blood glucose 3 times daily. For blood sugars 0-150 give 0 units of insulin, 151-200 give 2 units of insulin, 201-250 give 4 units, 251-300 give 6 units, 301-350 give 8 units, 351-400 give 10 units,> 400 give 12 units and call MD 03/21/22   Claiborne Rigg, NP  Insulin Glargine (BASAGLAR KWIKPEN) 100 UNIT/ML Inject 17 Units into the skin 2 (two) times daily. Patient taking differently: Inject 10 Units into the skin 2 (two) times daily. 12/26/21   Hoy Register, MD  Insulin Pen Needle (B-D UF III MINI PEN NEEDLES) 31G X 5 MM MISC Insert into the skin as prescribed 07/26/21   [name], PA-C  methocarbamol (ROBAXIN) 500 MG tablet Take 1 tablet (500 mg total) by mouth every 8 (eight) hours as needed for  muscle spasms. 02/22/21   Claiborne Rigg, NP  metoprolol succinate (TOPROL-XL) 25 MG 24 hr tablet Take 1 tablet (25 mg total) by mouth daily. 03/20/22   Waldon Merl, PA-C  oxyCODONE (ROXICODONE) 5 MG immediate release tablet Take 1 tablet (5 mg total) by mouth every 6 (six) hours as needed for up to 10 doses for severe pain. 06/04/22   Mardene Sayer, MD  pantoprazole (PROTONIX) 40 MG tablet Take 1 tablet (40 mg total) by mouth daily. 02/01/22   Terrilee Files, MD  simvastatin (ZOCOR) 20 MG tablet TAKE ONE TABLET BY  MOUTH at bedtime Patient taking differently: Take 20 mg by mouth every evening. 10/11/21   Hoy Register, MD  TRUEplus Lancets 28G MISC use as directed 3 (three) times daily. 07/26/21   [name], PA-C  insulin lispro (HUMALOG KWIKPEN) 100 UNIT/ML KwikPen Monitor blood glucose TID. For blood sugars 0-150 give 0 units of insulin, 151-200 give 2 units of insulin, 201-250 give 4 units, 251-300 give 6 units, 301-350 give 8 units, 351-400 give 10 units,> 400 give 12 units and call M.D. Discussed hypoglycemia protocol. 03/22/20 04/08/20  Claiborne Rigg, NP      Allergies    Geodon [ziprasidone hcl], Ziprasidone mesylate, and Amlodipine    Review of Systems   Review of Systems  Cardiovascular:  Positive for chest pain.  All other systems reviewed and are negative.   Physical Exam Updated Vital Signs BP (!) 162/93 (BP Location: Right Arm)   Pulse (!) 53   Temp 98.5 F (36.9 C)   Resp 16   SpO2 100%  Physical Exam Vitals and nursing note reviewed.  Constitutional:      General: He is not in acute distress.    Appearance: Normal appearance.  HENT:     Head: Normocephalic and atraumatic.  Eyes:     General:        Right eye: No discharge.        Left eye: No discharge.  Cardiovascular:     Rate and Rhythm: Normal rate and regular rhythm.  Pulmonary:     Effort: Pulmonary effort is normal. No respiratory distress.  Chest:     Comments: No significant anterior to left-sided chest wall pain, patient does have shingles rash that starts just inferior to the pectoralis muscle on the left and extends in dermatomal distribution around to back ending at the midline.  I do not see any evidence of secondary skin infection or active lesions, patient has some purpleish appropriately healing zoster rash noted. Musculoskeletal:        General: No deformity.  Skin:    General: Skin is warm and dry.  Neurological:     Mental Status: He is alert and oriented to person, place, and time.   Psychiatric:        Mood and Affect: Mood normal.        Behavior: Behavior normal.     ED Results / Procedures / Treatments   Labs (all labs ordered are listed, but only abnormal results are displayed) Labs Reviewed - No data to display  EKG None  Radiology No results found.  Procedures Procedures    Medications Ordered in ED Medications - No data to display  ED Course/ Medical Decision Making/ A&P                           Medical Decision Making  This patient is a  46 y.o. male who presents to the ED for concern of left-sided chest pain, chest skin sensation, this involves an extensive number of treatment options, and is a complaint that carries with it a high risk of complications and morbidity. The emergent differential diagnosis prior to evaluation includes, but is not limited to, ongoing shingles rash, postherpetic neuralgia most likely, but considered, ACS, AAS, PE, Mallory-Weiss, Boerhaave's, Pneumonia, acute bronchitis, asthma or COPD exacerbation, anxiety, MSK pain or traumatic injury to the chest, acid reflux versus other  This is not an exhaustive differential.   Past Medical History / Co-morbidities / Social History: Type 1 diabetes, hypertension, diabetic peripheral neuropathy, seizure disorder, known recent diagnosis of shingles  Additional history: Chart reviewed. Pertinent results include: Reviewed outpatient PCP visits, emergency department visits  Physical Exam: Physical exam performed. The pertinent findings include: Patient with shingles rash in dermatomal distribution.  He does have mild hypertension in the emergency department with blood pressure 162/93, but denies any anterior left chest wall pain above distribution of shingles.  He is in no acute distress.  He has mild bradycardia with a pulse of 53.   Cardiac Monitoring:  The patient was maintained on a cardiac monitor.  My attending physician Dr. Lynelle Doctor viewed and interpreted the cardiac  monitored which showed an underlying rhythm of: Sinus bradycardia, nonspecific T wave abnormality, no clear ischemic changes noted. I agree with this interpretation.   Disposition: After consideration of the diagnostic results and the patients response to treatment, I feel that patient stable for distribution, will discharge with triamcinolone to help with skin sensation, encouraged him to continue his gabapentin, and to follow-up with his PCP, discussed extensive return precautions.   emergency department workup does not suggest an emergent condition requiring admission or immediate intervention beyond what has been performed at this time. The plan is: as above. The patient is safe for discharge and has been instructed to return immediately for worsening symptoms, change in symptoms or any other concerns.  Final Clinical Impression(s) / ED Diagnoses Final diagnoses:  Herpes zoster with other complication  Post herpetic neuralgia    Rx / DC Orders ED Discharge Orders          Ordered    triamcinolone cream (KENALOG) 0.1 %  2 times daily        06/22/22 1627              Shakari Qazi, [place], PA-C 06/22/22 1634    Linwood Dibbles, MD 06/22/22 2232

## 2022-06-22 NOTE — Telephone Encounter (Signed)
  Chief Complaint: chest pain, elevated blood glucose elevated BP.  Symptoms: chest pain where shingles rash was located possible neuropathic pain still remains.c/o left arm pain but reports could be from  where dexcom located  comes and goes  lasting greater than 5 minutes at times.C/o fasting blood sugar running 215- 280s. C/o elevated BP while in ED.  Frequency: since 06/04/22 Pertinent Negatives: Patient denies difficulty breathing , no nausea no sweating  Disposition: [x] ED /[] Urgent Care (no appt availability in office) / [] Appointment(In office/virtual)/ []  [place] [facility]/ [] Home Care/ [] Refused Recommended Disposition /[] [place] [facility]/ []  Follow-up with PCP Additional Notes:  Recommended ED due to sx continuing. In review of chart provider Z. , NP no  longer provider for patient  03/26/22. Patient reports he would like to see Z. again . Please advise. If patient can be scheduled appt .    Reason for Disposition  [1] Chest pain lasts > 5 minutes AND [2] occurred > 3 days ago (72 hours) AND [3] NO chest pain or cardiac symptoms now  Answer Assessment - Initial Assessment Questions 1. LOCATION: "Where does it hurt?"       Center of chest above sternum  2. RADIATION: "Does the pain go anywhere else?" (e.g., into neck, jaw, arms, back)     Left arm pain  3. ONSET: "When did the chest pain begin?" (Minutes, hours or days)      Has been coming and going since shingles 06/04/22. 4. PATTERN: "Does the pain come and go, or has it been constant since it started?"  "Does it get worse with exertion?"      Comes and goes  5. DURATION: "How long does it last" (e.g., seconds, minutes, hours)     20 - 30 minutes  6. SEVERITY: "How bad is the pain?"  (e.g., Scale 1-10; mild, moderate, or severe)    - MILD (1-3): doesn't interfere with normal activities     - MODERATE (4-7): interferes with normal activities or awakens from sleep    - SEVERE (8-10): excruciating pain,  unable to do any normal activities       Pain 8 if chest pain occurs.  7. CARDIAC RISK FACTORS: "Do you have any history of heart problems or risk factors for heart disease?" (e.g., angina, prior heart attack; diabetes, high blood pressure, high cholesterol, smoker, or strong family history of heart disease)     Hx diabetes  8. PULMONARY RISK FACTORS: "Do you have any history of lung disease?"  (e.g., blood clots in lung, asthma, emphysema, birth control pills)     Na  9. CAUSE: "What do you think is causing the chest pain?"     From shingles 06/04/22 10. OTHER SYMPTOMS: "Do you have any other symptoms?" (e.g., dizziness, nausea, vomiting, sweating, fever, difficulty breathing, cough)       Elevated blood glucose.215-280 fasting., stressed due to possible divorce / separation. Chest discomfort to area of shingles out break from 06/04/22, elevated BP at times.  11. PREGNANCY: "Is there any chance you are pregnant?" "When was your last menstrual period?"       na  Protocols used: Chest Pain-A-AH

## 2022-07-11 ENCOUNTER — Ambulatory Visit: Payer: Self-pay | Admitting: *Deleted

## 2022-07-11 NOTE — Telephone Encounter (Signed)
Message left to call office

## 2022-07-11 NOTE — Telephone Encounter (Signed)
Per agent: "Summary: post shingles pain   Pt had shingles 06/22/22 and prescribed 3 meds: an abx, gabapentin (NEURONTIN) 100 MG capsule and a pain medicine. Pt would like to know if he can get a refill of his gabapentin and pain med due to ongoing nerve pain.  Pt states he is still having the nerve pain on his side.  Please advise"         Chief Complaint:  POSTHERPETIC NEURALGIA  Symptoms: Had shingles 06/22/22 12/10 pain remains Frequency: 06/22/22 Pertinent Negatives: Patient denies fever Disposition: [] ED /[] Urgent Care (no appt availability in office) / [] Appointment(In office/virtual)/ []  [place] [facility]/ [] Home Care/ [] Refused Recommended Disposition /[] [place] [facility]/ [x]  Follow-up with PCP Additional Notes: Pt requesting refill of gabapentin and pain meds. States pain has not decreased since breakout. Please advise.  Reason for Disposition  Pain persisting > 1 month after rash disappears  Answer Assessment - Initial Assessment Questions 1. APPEARANCE of RASH: "Describe the rash."      Shingles gone , skin discolored 2. LOCATION: "Where is the rash located?"      Had rash in between chest and stomach, left side rib cage 3. ONSET: "When did the rash start?"      Had shingles 06/22/22 4. ITCHING: "Does the rash itch?" If Yes, ask: "How bad is the itch?"  (Scale 1-10; or mild, moderate, severe)     no 5. PAIN: "Does the rash hurt?" If Yes, ask: "How bad is the pain?"  (Scale 0-10; or none, mild, moderate, severe)    - NONE (0): no pain    - MILD (1-3): doesn't interfere with normal activities     - MODERATE (4-7): interferes with normal activities or awakens from sleep     - SEVERE (8-10): excruciating pain, unable to do any normal activities     12/10, constant. 6. OTHER SYMPTOMS: "Do you have any other symptoms?" (e.g., fever)     Hypersensitivity in areas..  Protocols used: Shingles (Zoster)-A-AH

## 2022-07-23 ENCOUNTER — Other Ambulatory Visit: Payer: Self-pay | Admitting: Nurse Practitioner

## 2022-07-23 DIAGNOSIS — E108 Type 1 diabetes mellitus with unspecified complications: Secondary | ICD-10-CM

## 2022-07-23 NOTE — Telephone Encounter (Signed)
Requested medication (s) are due for refill today: yes  Requested medication (s) are on the active medication list: yes  Last refill:  03/21/22 #49ml/0  Future visit scheduled: yes  Notes to clinic:  Unable to refill per protocol due to failed labs, no updated results.    Requested Prescriptions  Pending Prescriptions Disp Refills   NOVOLOG FLEXPEN 100 UNIT/ML FlexPen [Pharmacy Med Name: NOVOLOG 100 UNIT/ML FLEXPEN]      Sig: Inject 2-10 Units into the skin 3 (three) times daily with meals. Monitor blood glucose 3 times daily. For blood sugars 0-150 give 0 units of insulin, 151-200 give 2 units of insulin, 201-250 give 4 units, 251-300 give 6 units, 301-350 give 8 units, 351-400 give 10 units,> 400 give 12 units and call MD     Endocrinology:  Diabetes - Insulins Failed - 07/23/2022  9:23 AM      Failed - HBA1C is between 0 and 7.9 and within 180 days    HbA1c, POC (controlled diabetic range)  Date Value Ref Range Status  07/26/2021 9.4 (A) 0.0 - 7.0 % Final   Hgb A1c MFr Bld  Date Value Ref Range Status  11/17/2021 10.0 (H) 4.8 - 5.6 % Final    Comment:             Prediabetes: 5.7 - 6.4          Diabetes: >6.4          Glycemic control for adults with diabetes: <7.0          Failed - Valid encounter within last 6 months    Recent Outpatient Visits           8 months ago Essential hypertension, benign   [place], [place] W, NP   8 months ago DM (diabetes mellitus), type 1 with complications [facility])   [place], Vernia Buff, NP   8 months ago Essential hypertension   [place], Jarome Matin, RPH-CPP   9 months ago Tremor of both hands   [place], MD   12 months ago DM (diabetes mellitus), type 1 with complications [facility])   [place], Dionne Bucy, [place]       Future  Appointments             In 1 month Wynetta Emery, Dalbert Batman, MD [place]   In 4 months Shamleffer, Melanie Crazier, MD [facility] Endocrinology

## 2022-07-30 ENCOUNTER — Ambulatory Visit: Payer: Self-pay | Admitting: Internal Medicine

## 2022-08-20 ENCOUNTER — Ambulatory Visit: Payer: Self-pay

## 2022-08-20 ENCOUNTER — Telehealth: Payer: Self-pay | Admitting: Internal Medicine

## 2022-08-20 NOTE — Telephone Encounter (Signed)
     Chief Complaint: Continued pain from shingles . Asking to be worked in. Symptoms: Pain. Area on left chest/ribs radiating to back. Frequency: 2 months Pertinent Negatives: Patient denies fever Disposition: [] ED /[] Urgent Care (no appt availability in office) / [] Appointment(In office/virtual)/ []  [place] [facility]/ [] Home Care/ [] Refused Recommended Disposition /[] [place] [facility]/ [x]  Follow-up with PCP Additional Notes: Please advise pt.  Answer Assessment - Initial Assessment Questions 1. APPEARANCE of RASH: "Describe the rash."      Dark brown, no open areas  2. LOCATION: "Where is the rash located?"      Left ribs, chest 3. ONSET: "When did the rash start?"      Last 1-2 months 4. ITCHING: "Does the rash itch?" If Yes, ask: "How bad is the itch?"  (Scale 1-10; or mild, moderate, severe)     No 5. PAIN: "Does the rash hurt?" If Yes, ask: "How bad is the pain?"  (Scale 0-10; or none, mild, moderate, severe)    - NONE (0): no pain    - MILD (1-3): doesn't interfere with normal activities     - MODERATE (4-7): interferes with normal activities or awakens from sleep     - SEVERE (8-10): excruciating pain, unable to do any normal activities     Severe 6. OTHER SYMPTOMS: "Do you have any other symptoms?" (e.g., fever)     No 7. PREGNANCY: "Is there any chance you are pregnant?" "When was your last menstrual period?"     N/a  Protocols used: Shingles (Zoster)-A-AH

## 2022-08-20 NOTE — Telephone Encounter (Signed)
Pt is calling to speak to the financial counselor to receive information on how to qualify for the orange card. [phone]

## 2022-08-21 ENCOUNTER — Ambulatory Visit: Payer: Self-pay | Admitting: *Deleted
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
## 2022-08-22 ENCOUNTER — Other Ambulatory Visit: Payer: Self-pay

## 2022-08-22 ENCOUNTER — Inpatient Hospital Stay (HOSPITAL_COMMUNITY)
Admission: EM | Admit: 2022-08-22 | Discharge: 2022-08-24 | DRG: 638 | Disposition: A | Discharge status: Home / Self Care | Payer: 59 | Attending: Internal Medicine | Admitting: Internal Medicine

## 2022-08-22 DIAGNOSIS — N179 Acute kidney failure, unspecified: Secondary | ICD-10-CM | POA: Diagnosis present

## 2022-08-22 DIAGNOSIS — Z87891 Personal history of nicotine dependence: Secondary | ICD-10-CM | POA: Diagnosis not present

## 2022-08-22 DIAGNOSIS — Z91148 Patient's other noncompliance with medication regimen for other reason: Secondary | ICD-10-CM | POA: Diagnosis not present

## 2022-08-22 DIAGNOSIS — E785 Hyperlipidemia, unspecified: Secondary | ICD-10-CM | POA: Diagnosis present

## 2022-08-22 DIAGNOSIS — Z79899 Other long term (current) drug therapy: Secondary | ICD-10-CM

## 2022-08-22 DIAGNOSIS — R739 Hyperglycemia, unspecified: Principal | ICD-10-CM

## 2022-08-22 DIAGNOSIS — R809 Proteinuria, unspecified: Secondary | ICD-10-CM | POA: Diagnosis present

## 2022-08-22 DIAGNOSIS — Z888 Allergy status to other drugs, medicaments and biological substances status: Secondary | ICD-10-CM

## 2022-08-22 DIAGNOSIS — B0229 Other postherpetic nervous system involvement: Secondary | ICD-10-CM | POA: Diagnosis present

## 2022-08-22 DIAGNOSIS — E860 Dehydration: Secondary | ICD-10-CM | POA: Diagnosis present

## 2022-08-22 DIAGNOSIS — T383X6A Underdosing of insulin and oral hypoglycemic [antidiabetic] drugs, initial encounter: Secondary | ICD-10-CM | POA: Diagnosis present

## 2022-08-22 DIAGNOSIS — Z8249 Family history of ischemic heart disease and other diseases of the circulatory system: Secondary | ICD-10-CM

## 2022-08-22 DIAGNOSIS — I129 Hypertensive chronic kidney disease with stage 1 through stage 4 chronic kidney disease, or unspecified chronic kidney disease: Secondary | ICD-10-CM | POA: Diagnosis present

## 2022-08-22 DIAGNOSIS — N1831 Chronic kidney disease, stage 3a: Secondary | ICD-10-CM | POA: Diagnosis present

## 2022-08-22 DIAGNOSIS — E1022 Type 1 diabetes mellitus with diabetic chronic kidney disease: Secondary | ICD-10-CM | POA: Diagnosis present

## 2022-08-22 DIAGNOSIS — Z794 Long term (current) use of insulin: Secondary | ICD-10-CM

## 2022-08-22 DIAGNOSIS — R823 Hemoglobinuria: Secondary | ICD-10-CM | POA: Diagnosis present

## 2022-08-22 DIAGNOSIS — R001 Bradycardia, unspecified: Secondary | ICD-10-CM | POA: Diagnosis not present

## 2022-08-22 DIAGNOSIS — E10649 Type 1 diabetes mellitus with hypoglycemia without coma: Secondary | ICD-10-CM | POA: Diagnosis not present

## 2022-08-22 DIAGNOSIS — Z833 Family history of diabetes mellitus: Secondary | ICD-10-CM

## 2022-08-22 DIAGNOSIS — Z597 Insufficient social insurance and welfare support: Secondary | ICD-10-CM

## 2022-08-22 DIAGNOSIS — E1110 Type 2 diabetes mellitus with ketoacidosis without coma: Secondary | ICD-10-CM

## 2022-08-22 DIAGNOSIS — E1010 Type 1 diabetes mellitus with ketoacidosis without coma: Secondary | ICD-10-CM | POA: Diagnosis present

## 2022-08-22 DIAGNOSIS — I959 Hypotension, unspecified: Secondary | ICD-10-CM | POA: Diagnosis not present

## 2022-08-22 LAB — I-STAT VENOUS BLOOD GAS, ED
Acid-base deficit: 11 mmol/L — ABNORMAL HIGH (ref 0.0–2.0)
Acid-base deficit: 14 mmol/L — ABNORMAL HIGH (ref 0.0–2.0)
Bicarbonate: 16 mmol/L — ABNORMAL LOW (ref 20.0–28.0)
Bicarbonate: 12.2 mmol/L — ABNORMAL LOW (ref 20.0–28.0)
Calcium, Ion: 1.14 mmol/L — ABNORMAL LOW (ref 1.15–1.40)
Calcium, Ion: 1.11 mmol/L — ABNORMAL LOW (ref 1.15–1.40)
HCT: 42 % (ref 39.0–52.0)
HCT: 41 % (ref 39.0–52.0)
Hemoglobin: 14.3 g/dL (ref 13.0–17.0)
Hemoglobin: 13.9 g/dL (ref 13.0–17.0)
O2 Saturation: 89 %
O2 Saturation: 93 %
Potassium: 5.4 mmol/L — ABNORMAL HIGH (ref 3.5–5.1)
Potassium: 5.6 mmol/L — ABNORMAL HIGH (ref 3.5–5.1)
Sodium: 128 mmol/L — ABNORMAL LOW (ref 135–145)
Sodium: 128 mmol/L — ABNORMAL LOW (ref 135–145)
TCO2: 17 mmol/L — ABNORMAL LOW (ref 22–32)
TCO2: 13 mmol/L — ABNORMAL LOW (ref 22–32)
pCO2, Ven: 39.4 mmHg — ABNORMAL LOW (ref 44–60)
pCO2, Ven: 29 mmHg — ABNORMAL LOW (ref 44–60)
pH, Ven: 7.217 — ABNORMAL LOW (ref 7.25–7.43)
pH, Ven: 7.231 — ABNORMAL LOW (ref 7.25–7.43)
pO2, Ven: 67 mmHg — ABNORMAL HIGH (ref 32–45)
pO2, Ven: 79 mmHg — ABNORMAL HIGH (ref 32–45)

## 2022-08-22 LAB — CBC WITH DIFFERENTIAL/PLATELET
Abs Immature Granulocytes: 0.06 10*3/uL (ref 0.00–0.07)
Abs Immature Granulocytes: 0.06 10*3/uL (ref 0.00–0.07)
Basophils Absolute: 0 10*3/uL (ref 0.0–0.1)
Basophils Absolute: 0 10*3/uL (ref 0.0–0.1)
Basophils Relative: 0 %
Basophils Relative: 0 %
Eosinophils Absolute: 0 10*3/uL (ref 0.0–0.5)
Eosinophils Absolute: 0 10*3/uL (ref 0.0–0.5)
Eosinophils Relative: 0 %
Eosinophils Relative: 0 %
HCT: 42.2 % (ref 39.0–52.0)
HCT: 41.2 % (ref 39.0–52.0)
Hemoglobin: 14.2 g/dL (ref 13.0–17.0)
Hemoglobin: 13.9 g/dL (ref 13.0–17.0)
Immature Granulocytes: 0 %
Immature Granulocytes: 0 %
Lymphocytes Relative: 8 %
Lymphocytes Relative: 8 %
Lymphs Abs: 1.1 10*3/uL (ref 0.7–4.0)
Lymphs Abs: 1.2 10*3/uL (ref 0.7–4.0)
MCH: 28.9 pg (ref 26.0–34.0)
MCH: 29.3 pg (ref 26.0–34.0)
MCHC: 33.6 g/dL (ref 30.0–36.0)
MCHC: 33.7 g/dL (ref 30.0–36.0)
MCV: 85.8 fL (ref 80.0–100.0)
MCV: 86.7 fL (ref 80.0–100.0)
Monocytes Absolute: 0.4 10*3/uL (ref 0.1–1.0)
Monocytes Absolute: 0.5 10*3/uL (ref 0.1–1.0)
Monocytes Relative: 3 %
Monocytes Relative: 3 %
Neutro Abs: 12.4 10*3/uL — ABNORMAL HIGH (ref 1.7–7.7)
Neutro Abs: 14.3 10*3/uL — ABNORMAL HIGH (ref 1.7–7.7)
Neutrophils Relative %: 89 %
Neutrophils Relative %: 89 %
Platelets: 281 10*3/uL (ref 150–400)
Platelets: 287 10*3/uL (ref 150–400)
RBC: 4.92 MIL/uL (ref 4.22–5.81)
RBC: 4.75 MIL/uL (ref 4.22–5.81)
RDW: 12.6 % (ref 11.5–15.5)
RDW: 12.6 % (ref 11.5–15.5)
WBC: 14.1 10*3/uL — ABNORMAL HIGH (ref 4.0–10.5)
WBC: 16.1 10*3/uL — ABNORMAL HIGH (ref 4.0–10.5)
nRBC: 0 % (ref 0.0–0.2)
nRBC: 0 % (ref 0.0–0.2)

## 2022-08-22 LAB — BASIC METABOLIC PANEL
Anion gap: 22 — ABNORMAL HIGH (ref 5–15)
Anion gap: 18 — ABNORMAL HIGH (ref 5–15)
Anion gap: 12 (ref 5–15)
Anion gap: 10 (ref 5–15)
BUN: 47 mg/dL — ABNORMAL HIGH (ref 6–20)
BUN: 47 mg/dL — ABNORMAL HIGH (ref 6–20)
BUN: 45 mg/dL — ABNORMAL HIGH (ref 6–20)
BUN: 43 mg/dL — ABNORMAL HIGH (ref 6–20)
CO2: 12 mmol/L — ABNORMAL LOW (ref 22–32)
CO2: 15 mmol/L — ABNORMAL LOW (ref 22–32)
CO2: 21 mmol/L — ABNORMAL LOW (ref 22–32)
CO2: 24 mmol/L (ref 22–32)
Calcium: 8.8 mg/dL — ABNORMAL LOW (ref 8.9–10.3)
Calcium: 9.1 mg/dL (ref 8.9–10.3)
Calcium: 9.1 mg/dL (ref 8.9–10.3)
Calcium: 9.2 mg/dL (ref 8.9–10.3)
Chloride: 99 mmol/L (ref 98–111)
Chloride: 100 mmol/L (ref 98–111)
Chloride: 102 mmol/L (ref 98–111)
Chloride: 104 mmol/L (ref 98–111)
Creatinine, Ser: 2.94 mg/dL — ABNORMAL HIGH (ref 0.61–1.24)
Creatinine, Ser: 2.82 mg/dL — ABNORMAL HIGH (ref 0.61–1.24)
Creatinine, Ser: 2.57 mg/dL — ABNORMAL HIGH (ref 0.61–1.24)
Creatinine, Ser: 2.46 mg/dL — ABNORMAL HIGH (ref 0.61–1.24)
GFR, Estimated: 26 mL/min — ABNORMAL LOW (ref >60)
GFR, Estimated: 27 mL/min — ABNORMAL LOW (ref >60)
GFR, Estimated: 31 mL/min — ABNORMAL LOW (ref >60)
GFR, Estimated: 32 mL/min — ABNORMAL LOW (ref >60)
Glucose, Bld: 558 mg/dL (ref 70–99)
Glucose, Bld: 472 mg/dL — ABNORMAL HIGH (ref 70–99)
Glucose, Bld: 224 mg/dL — ABNORMAL HIGH (ref 70–99)
Glucose, Bld: 115 mg/dL — ABNORMAL HIGH (ref 70–99)
Potassium: 5 mmol/L (ref 3.5–5.1)
Potassium: 4.2 mmol/L (ref 3.5–5.1)
Potassium: 4.1 mmol/L (ref 3.5–5.1)
Potassium: 4.2 mmol/L (ref 3.5–5.1)
Sodium: 133 mmol/L — ABNORMAL LOW (ref 135–145)
Sodium: 133 mmol/L — ABNORMAL LOW (ref 135–145)
Sodium: 135 mmol/L (ref 135–145)
Sodium: 138 mmol/L (ref 135–145)

## 2022-08-22 LAB — URINALYSIS, ROUTINE W REFLEX MICROSCOPIC
Bacteria, UA: NONE SEEN
Bilirubin Urine: NEGATIVE
Glucose, UA: 150 mg/dL — AB
Ketones, ur: 20 mg/dL — AB
Leukocytes,Ua: NEGATIVE
Nitrite: NEGATIVE
Protein, ur: >=300 mg/dL — AB
Specific Gravity, Urine: 1.017 (ref 1.005–1.030)
pH: 5 (ref 5.0–8.0)

## 2022-08-22 LAB — CBG MONITORING, ED
Glucose-Capillary: 549 mg/dL (ref 70–99)
Glucose-Capillary: 578 mg/dL (ref 70–99)
Glucose-Capillary: >600 mg/dL (ref 70–99)
Glucose-Capillary: 531 mg/dL (ref 70–99)
Glucose-Capillary: 540 mg/dL (ref 70–99)
Glucose-Capillary: 428 mg/dL — ABNORMAL HIGH (ref 70–99)
Glucose-Capillary: 494 mg/dL — ABNORMAL HIGH (ref 70–99)
Glucose-Capillary: 417 mg/dL — ABNORMAL HIGH (ref 70–99)

## 2022-08-22 LAB — GLUCOSE, CAPILLARY
Glucose-Capillary: 160 mg/dL — ABNORMAL HIGH (ref 70–99)
Glucose-Capillary: 183 mg/dL — ABNORMAL HIGH (ref 70–99)
Glucose-Capillary: 185 mg/dL — ABNORMAL HIGH (ref 70–99)
Glucose-Capillary: 187 mg/dL — ABNORMAL HIGH (ref 70–99)
Glucose-Capillary: 263 mg/dL — ABNORMAL HIGH (ref 70–99)
Glucose-Capillary: 358 mg/dL — ABNORMAL HIGH (ref 70–99)
Glucose-Capillary: 70 mg/dL (ref 70–99)
Glucose-Capillary: 75 mg/dL (ref 70–99)

## 2022-08-22 LAB — HEMOGLOBIN A1C
Hgb A1c MFr Bld: 8.9 % — ABNORMAL HIGH (ref 4.8–5.6)
Mean Plasma Glucose: 208.73 mg/dL

## 2022-08-22 LAB — COMPREHENSIVE METABOLIC PANEL
ALT: 34 U/L (ref 0–44)
AST: 45 U/L — ABNORMAL HIGH (ref 15–41)
Albumin: 2.8 g/dL — ABNORMAL LOW (ref 3.5–5.0)
Alkaline Phosphatase: 61 U/L (ref 38–126)
Anion gap: 23 — ABNORMAL HIGH (ref 5–15)
BUN: 44 mg/dL — ABNORMAL HIGH (ref 6–20)
CO2: 12 mmol/L — ABNORMAL LOW (ref 22–32)
Calcium: 9.2 mg/dL (ref 8.9–10.3)
Chloride: 94 mmol/L — ABNORMAL LOW (ref 98–111)
Creatinine, Ser: 3.09 mg/dL — ABNORMAL HIGH (ref 0.61–1.24)
GFR, Estimated: 25 mL/min — ABNORMAL LOW (ref >60)
Glucose, Bld: 616 mg/dL (ref 70–99)
Potassium: 5.5 mmol/L — ABNORMAL HIGH (ref 3.5–5.1)
Sodium: 129 mmol/L — ABNORMAL LOW (ref 135–145)
Total Bilirubin: 1.8 mg/dL — ABNORMAL HIGH (ref 0.3–1.2)
Total Protein: 5.6 g/dL — ABNORMAL LOW (ref 6.5–8.1)

## 2022-08-22 LAB — LIPID PANEL
Cholesterol: 278 mg/dL — ABNORMAL HIGH (ref 0–200)
HDL: 83 mg/dL (ref >40)
LDL Cholesterol: 170 mg/dL — ABNORMAL HIGH (ref 0–99)
Total CHOL/HDL Ratio: 3.3 RATIO
Triglycerides: 127 mg/dL (ref <150)
VLDL: 25 mg/dL (ref 0–40)

## 2022-08-22 LAB — BETA-HYDROXYBUTYRIC ACID
Beta-Hydroxybutyric Acid: 7.43 mmol/L — ABNORMAL HIGH (ref 0.05–0.27)
Beta-Hydroxybutyric Acid: 7.03 mmol/L — ABNORMAL HIGH (ref 0.05–0.27)
Beta-Hydroxybutyric Acid: 1.48 mmol/L — ABNORMAL HIGH (ref 0.05–0.27)

## 2022-08-22 LAB — HIV ANTIBODY (ROUTINE TESTING W REFLEX): HIV Screen 4th Generation wRfx: NONREACTIVE

## 2022-08-22 MED ORDER — LACTATED RINGERS IV BOLUS
1000.0000 mL | Freq: Once | INTRAVENOUS | Status: AC
  Administered 2022-08-22: 1000 mL via INTRAVENOUS

## 2022-08-22 MED ORDER — ACETAMINOPHEN 325 MG PO TABS
650.0000 mg | ORAL_TABLET | Freq: Four times a day (QID) | ORAL | Status: DC | PRN
  Administered 2022-08-22 – 2022-08-23 (×2): 650 mg via ORAL
  Filled 2022-08-22 (×2): qty 2

## 2022-08-22 MED ORDER — ENOXAPARIN SODIUM 40 MG/0.4ML IJ SOSY
40.0000 mg | PREFILLED_SYRINGE | INTRAMUSCULAR | Status: DC
  Administered 2022-08-22 – 2022-08-24 (×3): 40 mg via SUBCUTANEOUS
  Filled 2022-08-22 (×3): qty 0.4

## 2022-08-22 MED ORDER — METOPROLOL SUCCINATE ER 25 MG PO TB24
25.0000 mg | ORAL_TABLET | Freq: Every day | ORAL | Status: DC
  Administered 2022-08-22: 25 mg via ORAL
  Filled 2022-08-22 (×2): qty 1

## 2022-08-22 MED ORDER — DEXTROSE IN LACTATED RINGERS 5 % IV SOLN: INTRAVENOUS | Status: DC

## 2022-08-22 MED ORDER — SENNOSIDES-DOCUSATE SODIUM 8.6-50 MG PO TABS: 1.0000 | ORAL_TABLET | Freq: Every evening | ORAL | Status: DC | PRN

## 2022-08-22 MED ORDER — GABAPENTIN 300 MG PO CAPS
300.0000 mg | ORAL_CAPSULE | Freq: Every day | ORAL | Status: DC
  Administered 2022-08-22 – 2022-08-24 (×3): 300 mg via ORAL
  Filled 2022-08-22 (×3): qty 1

## 2022-08-22 MED ORDER — ONDANSETRON HCL 4 MG/2ML IJ SOLN
4.0000 mg | Freq: Once | INTRAMUSCULAR | Status: AC
  Administered 2022-08-22: 4 mg via INTRAVENOUS
  Filled 2022-08-22: qty 2

## 2022-08-22 MED ORDER — INSULIN REGULAR(HUMAN) IN NACL 100-0.9 UT/100ML-% IV SOLN
INTRAVENOUS | Status: DC
  Administered 2022-08-22: 9.5 [IU]/h via INTRAVENOUS
  Filled 2022-08-22: qty 100

## 2022-08-22 MED ORDER — INSULIN GLARGINE-YFGN 100 UNIT/ML ~~LOC~~ SOLN
20.0000 [IU] | Freq: Every day | SUBCUTANEOUS | Status: DC
  Administered 2022-08-22: 20 [IU] via SUBCUTANEOUS
  Filled 2022-08-22 (×2): qty 0.2

## 2022-08-22 MED ORDER — DEXTROSE 50 % IV SOLN: 0.0000 mL | INTRAVENOUS | Status: DC | PRN

## 2022-08-22 MED ORDER — ACETAMINOPHEN 650 MG RE SUPP: 650.0000 mg | Freq: Four times a day (QID) | RECTAL | Status: DC | PRN

## 2022-08-22 MED ORDER — LACTATED RINGERS IV SOLN: INTRAVENOUS | Status: DC

## 2022-08-22 MED ORDER — SIMVASTATIN 20 MG PO TABS
20.0000 mg | ORAL_TABLET | Freq: Every evening | ORAL | Status: DC
  Administered 2022-08-22 – 2022-08-23 (×2): 20 mg via ORAL
  Filled 2022-08-22 (×3): qty 1

## 2022-08-22 MED ORDER — BENAZEPRIL HCL 5 MG PO TABS
40.0000 mg | ORAL_TABLET | Freq: Every day | ORAL | Status: DC
  Administered 2022-08-22: 40 mg via ORAL
  Filled 2022-08-22: qty 2
  Filled 2022-08-22: qty 8

## 2022-08-22 MED ORDER — LACTATED RINGERS IV BOLUS
20.0000 mL/kg | Freq: Once | INTRAVENOUS | Status: AC
  Administered 2022-08-22: 1478 mL via INTRAVENOUS

## 2022-08-22 NOTE — Progress Notes (Signed)
Patient arrived in the unit,CHG bath given, vitals checked,CCMD notified,patient oriented to the unit

## 2022-08-22 NOTE — Discharge Planning (Signed)
RNCM consulted regarding uninsured pt requiring diabetic Rx.  RNCM suggests sending [facility] who will deliver Rx to patient at bedside prior to discharge from hospital.   [facility] obtained follow-up appointment on (2/20), time (2:30) with Juluis Mire, NP and placed on After Visit Summary paperwork.  No further case management needs communicated at this time. Billijo Dilling J. Clydene Laming, [place], [place], [place]

## 2022-08-22 NOTE — H&P (Cosign Needed Addendum)
Date: 08/22/2022               Patient Name:  [name] MRN: [id number]  DOB: 1976-02-19 Age / Sex: 47 y.o., male   PCP: Gildardo Pounds, NP         Medical Service: Internal Medicine Teaching Service         Attending Physician: Dr. Aldine Contes, MD    First Contact: Dr. Gaylyn Rong, MD Pager: [phone]  Second Contact: Dr. Virl Axe, MD Pager: [phone]       After Hours (After 5p/  First Contact Pager: [phone]  weekends / holidays): Second Contact Pager: [phone]   Chief Complaint: Hyperglycemia, fatigue, N/V  History of Present Illness: Mr. Alm is a 47 year old with a history of T1DM, HTN, CKD IIIA and postherpetic neuralgia who presents via EMS for evaluation of elevated blood sugar, fatigue, nausea and vomiting. States he was diagnosed with type 1 diabetes at the age of 79 previously followed with endocrinologist.  He has been followed by community [facility] and has been managing his diabetes with twice daily insulin with a Dexcom reader and sensor. About 4 months ago, he lost his insurance and has been paying for his insulin out-of-pocket. He has been unable to afford his Dexcom 2 for the last 2 months. He lost the rest of his insulin 3 days ago while moving to a new place. Since then, he has had increasing fatigue, polydipsia and polyuria. He has not been able to tolerate any food for the past 2 days. He tried to eat some solid yesterday evening but vomited at night. States the color of his urine has turned dark yellow with a fruity smell to it.  He endorsed mild headache and palpitations but denies any vision changes, shortness of breath, chest pain, abdominal pain or dysuria.   ED course: Found to be hypertensive with SBP in the 180s and mildly tachycardic to the 90s. Labs show CBG 549, WBC 14.1, BMP with sodium 129, K+ 5.5, bicarb 12, glucose 616, creatinine 3.09, albumin 2.8, bili 1.8.  BHB 7.43.  VBG shows pH 7.22, pCO2 39.4, pO2 67 and bicarb 16. UA  shows glucosuria, proteinuria, ketonuria and small hemoglobinuria. Patient was started on Endo Tool and IMTS was consulted for admission for DKA.   Meds:  Basaglar 16 units in a.m., 14 units in p.m. 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 He was out of the job for the last 4 months securing similar position that started yesterday. He was evicted from his apartment and currently lives with a roommate. He smoked 2 to 3 cigarettes a day briefly for 3 months when his wife left him in October 2023.  Denies EtOH or illicit drug use.  CP: Geryl Rankins, NP at [facility]  Review of Systems: A complete ROS was negative except as per HPI.  Physical Exam:  Blood pressure (!) 166/73, pulse 90, temperature 99 F (37.2 C), temperature source Oral, resp. rate 20, height 5' 11"$  (1.803 m), weight 73.9 kg, SpO2 100 %.  General: Pleasant, well-appearing middle-age man laying in bed. No acute distress. HEENT: PERRLA. Anicteric sclera. Dry mucous membrane. CV: Tachycardic. Regular rhythm. No murmurs, rubs, or gallops. No LE edema Pulmonary: Lungs CTAB. Normal effort. No wheezing or rales. Abdominal: Soft, nontender, nondistended. Normal bowel sounds. Extremities: Palpable pulses. Normal ROM.  Skin: Warm and dry. Multiple hyperpigmented areas in a dermatomal distribution on L chest wall and back in the T5-6 distribution. The  area is slightly tender to touch. 2-3 healed cut marks on left arm.  Neuro: A&Ox3. Moves all extremities. Normal sensation. No focal deficit. Psych: Normal mood and affect  EKG: personally reviewed my interpretation is sinus tach with RAE and peaked T waves  Assessment & Plan by Problem: Principal Problem:   DKA (diabetic ketoacidosis) ([place])  #Diabetic ketoacidosis #Uncontrolled type 1 diabetes Patient with a history of type 1 diabetes presented with increased fatigue, nausea/vomiting, polyuria, and polydipsia for the past 3 days in the setting of running out of his insulin. Labs on admission shows severel hyperglycemia to the 600s, AGMA, and ketonuria/elevated BHB consistent with diabetic ketoacidosis.  K+ elevated to 5.5 on admission, so no potassium supplementation needed.  Patient received IV fluid bolus and started on Endo Tool.  TOC and diabetes coordinator consulted to assist with medication assistance. Patient dry on exam but alert and oriented and at baseline mental status. He is hypertensive but remains hemodynamically stable. -Continue insulin drip -S/p 2.5 LR bolus -LR @ 125 mL/hr until CBG less than 250 -Switch to D5-LR when 1 CBG less than 250 -Keep NPO  -BMP Q4H, CBG Q1H, BHB Q8H -Once anion gap closed 2, start CM diet and if able to eat, administer Lantus 20 units -Continue insulin drip for 1-2 more hours, then discontinue and start SSI-S  -DC fluids if eating, drinking, and off insulin drip -Diabetes coordinator following, appreciate assistance -TOC following, appreciate assistance with medications  #Hypertension Patient found to have severely elevated SBP in the 180s on arrival, improved to the 160s with IV fluid. Home regimen includes benazepril 40 mg daily and metoprolol 25 mg daily.  -Resume benazepril 40 mg daily -Resume metoprolol 25 mg daily  #AKI on CKD IIIA On chart review, baseline creatinine around 1.3-1.5. BMP on arrival showed increasing creatinine to  3.09.  AKI prerenal in the setting of poor p.o. intake and DKA with osmotic diuresis.  -Continue IV fluids -Trend BMP as above -Avoid nephrotoxic agents  #Postherpetic neuralgia Patient reports that he had the chickenpox as a child but does not remember getting the varicella vaccine. He had shingles in November 2023 and has had pain described as burning sensation at the site of the shingles rash. He was prescribed oxycodone and gabapentin for pain for 1 month but has not been able to get any more refills of the gabapentin. On exam, patient has multiple hyperpigmented areas in the T5-6 distribution on L chest wall and back in the that is tender to touch. -Start gabapentin 300 mg daily and titrate up as needed for neuropathic pain  #Hyperlipidemia On simvastatin. Last lipid panel 9 months ago showed LDL of 147.  -Resume Simvastatin 20 mg daily -Check lipid panel  CODE STATUS: Full DIET: N.p.o. PPx: Lovenox  Dispo: Admit patient to Observation with expected length of stay less than 2 midnights.  Signed: Lacinda Axon, MD  08/22/2022, 1:06 PM  Pager: [phone] Internal Medicine Teaching Service After 5pm on weekdays and 1pm on weekends: On Call pager: [phone]

## 2022-08-22 NOTE — ED Provider Notes (Signed)
[facility] Provider Note   CSN: [id number] Arrival date & time: 08/22/22  [id number]     History Chief Complaint  Patient presents with   Hyperglycemia    HPI [name] is a 47 y.o. male presenting for chief complaint of fatigue and nausea vomiting.  He states that 3 days ago, he ran out of all of his medications including his insulin.  States he is a type II diabetic.  Poor p.o. intake, nausea vomiting. He denies fevers or chills, syncope shortness of breath, abdominal pain. Patient's recorded medical, surgical, social, medication list and allergies were reviewed in the Snapshot window as part of the initial history.   Review of Systems   Review of Systems  Constitutional:  Positive for fatigue. Negative for chills and fever.  HENT:  Negative for ear pain and sore throat.   Eyes:  Negative for pain and visual disturbance.  Respiratory:  Negative for cough and shortness of breath.   Cardiovascular:  Negative for chest pain and palpitations.  Gastrointestinal:  Positive for nausea and vomiting. Negative for abdominal pain.  Genitourinary:  Negative for dysuria and hematuria.  Musculoskeletal:  Negative for arthralgias and back pain.  Skin:  Negative for color change and rash.  Neurological:  Negative for seizures and syncope.  All other systems reviewed and are negative.   Physical Exam Updated Vital Signs BP (!) 151/63 (BP Location: Left Arm)   Pulse 94   Temp 99 F (37.2 C) (Oral)   Resp 16   Ht 5' 11"$  (1.803 m)   Wt 73.9 kg   SpO2 97%   BMI 22.73 kg/m  Physical Exam Vitals and nursing note reviewed.  Constitutional:      General: He is not in acute distress.    Appearance: He is well-developed.  HENT:     Head: Normocephalic and atraumatic.  Eyes:     Conjunctiva/sclera: Conjunctivae normal.  Cardiovascular:     Rate and Rhythm: Normal rate and regular rhythm.     Heart sounds: No murmur heard. Pulmonary:      Effort: Pulmonary effort is normal. No respiratory distress.     Breath sounds: Normal breath sounds.  Abdominal:     Palpations: Abdomen is soft.     Tenderness: There is no abdominal tenderness.  Musculoskeletal:        General: No swelling.     Cervical back: Neck supple.  Skin:    General: Skin is warm and dry.     Capillary Refill: Capillary refill takes less than 2 seconds.  Neurological:     Mental Status: He is alert.  Psychiatric:        Mood and Affect: Mood normal.      ED Course/ Medical Decision Making/ A&P Clinical Course as of 08/22/22 0848  Wed Aug 22, 2022  0847 Last QTC WNL [CC]    Clinical Course User Index [CC] Tretha Sciara, MD    Procedures .Critical Care  Performed by: Tretha Sciara, MD Authorized by: Tretha Sciara, MD   Critical care provider statement:    Critical care time (minutes):  45   Critical care was time spent personally by me on the following activities:  Development of treatment plan with patient or surrogate, discussions with consultants, evaluation of patient's response to treatment, examination of patient, ordering and review of laboratory studies, ordering and review of radiographic studies, ordering and performing treatments and interventions, pulse oximetry, re-evaluation of patient's condition and  review of old charts    Medications Ordered in ED Medications - No data to display  Medical Decision Making:    [name] is a 47 y.o. male who presented to the ED today with altered mental status detailed above.     Patient's presentation is complicated by their history of multiple comorbid medical problems.  Patient placed on continuous vitals and telemetry monitoring while in ED which was reviewed periodically.   Complete initial physical exam performed, notably the patient  was slightly tachycardic.      Reviewed and confirmed nursing documentation for past medical history, family history, social history.     Initial Assessment:   Patient's history present on his physical exam findings are most consistent with developing diabetic ketoacidosis.  Likely due to his insulin noncompliance in the setting of insurance difficulties. Extensive evaluation initiated with blood gas, ketone level, screening metabolic panel.  Demonstrates anion gap acidosis with pH 7.2 consistent with diabetic ketoacidosis given the elevated beta hydroxybutyrate.  Patient started on insulin drip after IV fluids.  Serial labs ordered and monitored.  Discussed with hospitalist who agreed with need for admission.  Disposition:   Based on the above findings, I believe this patient is stable for admission.    Patient/family educated about specific findings on our evaluation and explained exact reasons for admission.  Patient/family educated about clinical situation and time was allowed to answer questions.   Admission team communicated with and agreed with need for admission. Patient admitted. Patient ready to move at this time.     Emergency Department Medication Summary:   Medications  insulin regular, human (MYXREDLIN) 100 units/ 100 mL infusion (15 Units/hr Intravenous Rate/Dose Change 08/22/22 1530)  lactated ringers infusion (has no administration in time range)  dextrose 5 % in lactated ringers infusion (has no administration in time range)  dextrose 50 % solution 0-50 mL (has no administration in time range)  enoxaparin (LOVENOX) injection 40 mg (has no administration in time range)  acetaminophen (TYLENOL) tablet 650 mg (has no administration in time range)    Or  acetaminophen (TYLENOL) suppository 650 mg (has no administration in time range)  senna-docusate (Senokot-S) tablet 1 tablet (has no administration in time range)  benazepril (LOTENSIN) tablet 40 mg (40 mg Oral Given 08/22/22 1453)  metoprolol succinate (TOPROL-XL) 24 hr tablet 25 mg (25 mg Oral Given 08/22/22 1453)  simvastatin (ZOCOR) tablet 20 mg (has no  administration in time range)  gabapentin (NEURONTIN) capsule 300 mg (300 mg Oral Given 08/22/22 1453)  lactated ringers bolus 1,000 mL (0 mLs Intravenous Stopped 08/22/22 1044)  ondansetron (ZOFRAN) injection 4 mg (4 mg Intravenous Given 08/22/22 0924)  lactated ringers bolus 1,478 mL (0 mLs Intravenous Stopped 08/22/22 1349)         Clinical Impression: No diagnosis found.   Data Unavailable   Final Clinical Impression(s) / ED Diagnoses Final diagnoses:  None    Rx / DC Orders ED Discharge Orders     None         Tretha Sciara, MD 08/22/22 [phone]
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
## 2022-08-22 NOTE — ED Notes (Signed)
Next CBG check is 1554

## 2022-08-22 NOTE — ED Triage Notes (Signed)
Pt bib ems from home c/o hyperglycemia. Pt hasn't taken insulin in three days d/t insurance problems. Pt endorses n/v

## 2022-08-22 NOTE — ED Notes (Signed)
ED TO INPATIENT HANDOFF REPORT  ED Nurse Name and Phone #: Juel Burrow [phone]  S Name/Age/Gender [name] 47 y.o. male Room/Bed: 027C/027C  Code Status   Code Status: Full Code  Home/SNF/Other Home Patient oriented to: self, place, time, and situation Is this baseline? Yes   Triage Complete: Triage complete  Chief Complaint DKA (diabetic ketoacidosis) ([place]) [E11.10]  Triage Note Pt bib ems from home c/o hyperglycemia. Pt hasn't taken insulin in three days d/t insurance problems. Pt endorses n/v    Allergies Allergies  Allergen Reactions   Geodon [Ziprasidone Hcl] Other (See Comments)    Severe muscle spasms (ENTIRE BODY)- had to call EMS   Ziprasidone Mesylate Other (See Comments)    "severe muscle spasms"- Had to call EMS   Amlodipine Swelling    Level of Care/Admitting Diagnosis ED Disposition     ED Disposition  Admit   Condition  --   [place]: [place] [100100]  Level of Care: Progressive [102]  Admit to Progressive based on following criteria: MULTISYSTEM THREATS such as stable sepsis, metabolic/electrolyte imbalance with or without encephalopathy that is responding to early treatment.  May admit patient to [facility] or [facility] if equivalent level of care is available:: No  Covid Evaluation: Asymptomatic - no recent exposure (last 10 days) testing not required  Diagnosis: DKA (diabetic ketoacidosis) [facility]) [id number]  Admitting Physician: Aldine Contes [id number]  Attending Physician: Aldine Contes [id number]  Certification:: I certify this patient will need inpatient services for at least 2 midnights  Estimated Length of Stay: 2          B Medical/Surgery History Past Medical History:  Diagnosis Date   Diabetes mellitus    Hypertension    Seizures ([place])    Past Surgical History:  Procedure Laterality Date   HERNIA REPAIR       A IV Location/Drains/Wounds Patient Lines/Drains/Airways  Status     Active Line/Drains/Airways     Name Placement date Placement time Site Days   Peripheral IV 08/22/22 20 G 1.88" Anterior;Right Forearm 08/22/22  0923  Forearm  less than 1            Intake/Output Last 24 hours  Intake/Output Summary (Last 24 hours) at 08/22/2022 1442 Last data filed at 08/22/2022 1349 Gross per 24 hour  Intake 2478 ml  Output --  Net 2478 ml    Labs/Imaging Results for orders placed or performed during the hospital encounter of 08/22/22 (from the past 48 hour(s))  CBG monitoring, ED     Status: Abnormal   Collection Time: 08/22/22  8:44 AM  Result Value Ref Range   Glucose-Capillary 549 (HH) 70 - 99 mg/dL    Comment: Glucose reference range applies only to samples taken after fasting for at least 8 hours.   Comment 1 Notify RN    Comment 2 Document in Chart   CBC with Differential     Status: Abnormal   Collection Time: 08/22/22  8:58 AM  Result Value Ref Range   WBC 14.1 (H) 4.0 - 10.5 K/uL   RBC 4.92 4.22 - 5.81 MIL/uL   Hemoglobin 14.2 13.0 - 17.0 g/dL   HCT 42.2 39.0 - 52.0 %   MCV 85.8 80.0 - 100.0 fL   MCH 28.9 26.0 - 34.0 pg   MCHC 33.6 30.0 - 36.0 g/dL   RDW 12.6 11.5 - 15.5 %   Platelets 281 150 - 400 K/uL   nRBC 0.0 0.0 -  0.2 %   Neutrophils Relative % 89 %   Neutro Abs 12.4 (H) 1.7 - 7.7 K/uL   Lymphocytes Relative 8 %   Lymphs Abs 1.1 0.7 - 4.0 K/uL   Monocytes Relative 3 %   Monocytes Absolute 0.4 0.1 - 1.0 K/uL   Eosinophils Relative 0 %   Eosinophils Absolute 0.0 0.0 - 0.5 K/uL   Basophils Relative 0 %   Basophils Absolute 0.0 0.0 - 0.1 K/uL   Immature Granulocytes 0 %   Abs Immature Granulocytes 0.06 0.00 - 0.07 K/uL    Comment: Performed at [facility] [address], [place], [place] [address]  Comprehensive metabolic panel     Status: Abnormal   Collection Time: 08/22/22  8:58 AM  Result Value Ref Range   Sodium 129 (L) 135 - 145 mmol/L   Potassium 5.5 (H) 3.5 - 5.1 mmol/L   Chloride 94 (L) 98 - 111  mmol/L   CO2 12 (L) 22 - 32 mmol/L   Glucose, Bld 616 (HH) 70 - 99 mg/dL    Comment: CRITICAL RESULT CALLED TO, READ BACK BY AND VERIFIED WITH Willis Holquin RN.@0949$  ON 2.14.24 BY TCALDWELL MT. Glucose reference range applies only to samples taken after fasting for at least 8 hours.    BUN 44 (H) 6 - 20 mg/dL   Creatinine, Ser 3.09 (H) 0.61 - 1.24 mg/dL   Calcium 9.2 8.9 - 10.3 mg/dL   Total Protein 5.6 (L) 6.5 - 8.1 g/dL   Albumin 2.8 (L) 3.5 - 5.0 g/dL   AST 45 (H) 15 - 41 U/L   ALT 34 0 - 44 U/L   Alkaline Phosphatase 61 38 - 126 U/L   Total Bilirubin 1.8 (H) 0.3 - 1.2 mg/dL   GFR, Estimated 25 (L) >60 mL/min    Comment: (NOTE) Calculated using the CKD-EPI Creatinine Equation (2021)    Anion gap 23 (H) 5 - 15    Comment: ELECTROLYTES REPEATED TO VERIFY Performed at [facility], [address], [place], [place] [address]   Beta-hydroxybutyric acid     Status: Abnormal   Collection Time: 08/22/22  8:58 AM  Result Value Ref Range   Beta-Hydroxybutyric Acid 7.43 (H) 0.05 - 0.27 mmol/L    Comment: RESULT CONFIRMED BY MANUAL DILUTION Performed at [facility] [address] [place], [place] [address]   I-Stat venous blood gas, ED     Status: Abnormal   Collection Time: 08/22/22  9:15 AM  Result Value Ref Range   pH, Ven 7.217 (L) 7.25 - 7.43   pCO2, Ven 39.4 (L) 44 - 60 mmHg   pO2, Ven 67 (H) 32 - 45 mmHg   Bicarbonate 16.0 (L) 20.0 - 28.0 mmol/L   TCO2 17 (L) 22 - 32 mmol/L   O2 Saturation 89 %   Acid-base deficit 11.0 (H) 0.0 - 2.0 mmol/L   Sodium 128 (L) 135 - 145 mmol/L   Potassium 5.4 (H) 3.5 - 5.1 mmol/L   Calcium, Ion 1.14 (L) 1.15 - 1.40 mmol/L   HCT 42.0 39.0 - 52.0 %   Hemoglobin 14.3 13.0 - 17.0 g/dL   Sample type VENOUS   CBC with Differential (PNL)     Status: Abnormal   Collection Time: 08/22/22 10:24 AM  Result Value Ref Range   WBC 16.1 (H) 4.0 - 10.5 K/uL   RBC 4.75 4.22 - 5.81 MIL/uL   Hemoglobin 13.9 13.0 - 17.0 g/dL   HCT 41.2 39.0  -  52.0 %   MCV 86.7 80.0 - 100.0 fL   MCH 29.3 26.0 - 34.0 pg   MCHC 33.7 30.0 - 36.0 g/dL   RDW 12.6 11.5 - 15.5 %   Platelets 287 150 - 400 K/uL   nRBC 0.0 0.0 - 0.2 %   Neutrophils Relative % 89 %   Neutro Abs 14.3 (H) 1.7 - 7.7 K/uL   Lymphocytes Relative 8 %   Lymphs Abs 1.2 0.7 - 4.0 K/uL   Monocytes Relative 3 %   Monocytes Absolute 0.5 0.1 - 1.0 K/uL   Eosinophils Relative 0 %   Eosinophils Absolute 0.0 0.0 - 0.5 K/uL   Basophils Relative 0 %   Basophils Absolute 0.0 0.0 - 0.1 K/uL   Immature Granulocytes 0 %   Abs Immature Granulocytes 0.06 0.00 - 0.07 K/uL    Comment: Performed at [facility] [address], [place], [place] [address]  CBG monitoring, ED     Status: Abnormal   Collection Time: 08/22/22 10:33 AM  Result Value Ref Range   Glucose-Capillary >600 (HH) 70 - 99 mg/dL    Comment: Glucose reference range applies only to samples taken after fasting for at least 8 hours.  Urinalysis, Routine w reflex microscopic -Urine, Clean Catch     Status: Abnormal   Collection Time: 08/22/22 10:35 AM  Result Value Ref Range   Color, Urine YELLOW YELLOW   APPearance CLEAR CLEAR   Specific Gravity, Urine 1.017 1.005 - 1.030   pH 5.0 5.0 - 8.0   Glucose, UA 150 (A) NEGATIVE mg/dL   Hgb urine dipstick SMALL (A) NEGATIVE   Bilirubin Urine NEGATIVE NEGATIVE   Ketones, ur 20 (A) NEGATIVE mg/dL   Protein, ur >=300 (A) NEGATIVE mg/dL   Nitrite NEGATIVE NEGATIVE   Leukocytes,Ua NEGATIVE NEGATIVE   RBC / HPF 0-5 0 - 5 RBC/hpf   WBC, UA 0-5 0 - 5 WBC/hpf   Bacteria, UA NONE SEEN NONE SEEN   Squamous Epithelial / HPF 0-5 0 - 5 /HPF    Comment: Performed at [facility], [address], [place],  [address]  I-Stat Venous Blood Gas, ED     Status: Abnormal   Collection Time: 08/22/22 10:46 AM  Result Value Ref Range   pH, Ven 7.231 (L) 7.25 - 7.43   pCO2, Ven 29.0 (L) 44 - 60 mmHg   pO2, Ven 79 (H) 32 - 45 mmHg   Bicarbonate 12.2 (L) 20.0 - 28.0 mmol/L    TCO2 13 (L) 22 - 32 mmol/L   O2 Saturation 93 %   Acid-base deficit 14.0 (H) 0.0 - 2.0 mmol/L   Sodium 128 (L) 135 - 145 mmol/L   Potassium 5.6 (H) 3.5 - 5.1 mmol/L   Calcium, Ion 1.11 (L) 1.15 - 1.40 mmol/L   HCT 41.0 39.0 - 52.0 %   Hemoglobin 13.9 13.0 - 17.0 g/dL   Sample type VENOUS   CBG monitoring, ED     Status: Abnormal   Collection Time: 08/22/22 11:09 AM  Result Value Ref Range   Glucose-Capillary 578 (HH) 70 - 99 mg/dL    Comment: Glucose reference range applies only to samples taken after fasting for at least 8 hours.   Comment 1 Notify RN    Comment 2 Document in Chart   CBG monitoring, ED     Status: Abnormal   Collection Time: 08/22/22 11:50 AM  Result Value Ref Range   Glucose-Capillary 531 (HH) 70 - 99  mg/dL    Comment: Glucose reference range applies only to samples taken after fasting for at least 8 hours.   Comment 1 Notify RN    Comment 2 Document in Chart   Beta-hydroxybutyric acid     Status: Abnormal   Collection Time: 08/22/22 11:59 AM  Result Value Ref Range   Beta-Hydroxybutyric Acid 7.03 (H) 0.05 - 0.27 mmol/L    Comment: RESULT CONFIRMED BY MANUAL DILUTION Performed at [facility], [place] [address], [place], [place] [id number]   Basic metabolic panel     Status: Abnormal   Collection Time: 08/22/22 11:59 AM  Result Value Ref Range   Sodium 133 (L) 135 - 145 mmol/L   Potassium 5.0 3.5 - 5.1 mmol/L   Chloride 99 98 - 111 mmol/L   CO2 12 (L) 22 - 32 mmol/L   Glucose, Bld 558 (HH) 70 - 99 mg/dL    Comment: CRITICAL RESULT CALLED TO, READ BACK BY AND VERIFIED WITH Laini Urick RN.@1317$  ON 2.14.24 BY TCALDWELL MT. Glucose reference range applies only to samples taken after fasting for at least 8 hours.    BUN 47 (H) 6 - 20 mg/dL   Creatinine, Ser 2.94 (H) 0.61 - 1.24 mg/dL   Calcium 8.8 (L) 8.9 - 10.3 mg/dL   GFR, Estimated 26 (L) >60 mL/min    Comment: (NOTE) Calculated using the CKD-EPI Creatinine Equation (2021)    Anion gap 22 (H) 5 -  15    Comment: ELECTROLYTES REPEATED TO VERIFY Performed at [facility] [address], [place], [place] [address]   CBG monitoring, ED     Status: Abnormal   Collection Time: 08/22/22 12:45 PM  Result Value Ref Range   Glucose-Capillary 540 (HH) 70 - 99 mg/dL    Comment: Glucose reference range applies only to samples taken after fasting for at least 8 hours.   Comment 1 Document in Chart   CBG monitoring, ED     Status: Abnormal   Collection Time: 08/22/22  1:46 PM  Result Value Ref Range   Glucose-Capillary 494 (H) 70 - 99 mg/dL    Comment: Glucose reference range applies only to samples taken after fasting for at least 8 hours.  CBG monitoring, ED     Status: Abnormal   Collection Time: 08/22/22  2:29 PM  Result Value Ref Range   Glucose-Capillary 428 (H) 70 - 99 mg/dL    Comment: Glucose reference range applies only to samples taken after fasting for at least 8 hours.   Comment 1 Notify RN    Comment 2 Document in Chart    No results found.  Pending Labs Unresulted Labs (From admission, onward)     Start     Ordered   08/22/22 1432  Lipid panel  Once,   R        08/22/22 1431   08/22/22 1431  Hemoglobin A1c  Once,   R        08/22/22 1431   08/22/22 1304  HIV Antibody (routine testing w rflx)  (HIV Antibody (Routine testing w reflex) panel)  Once,   R        08/22/22 1305   08/22/22 0000000  Basic metabolic panel  (Diabetes Ketoacidosis (DKA))  STAT Now then every 4 hours ,   STAT      08/22/22 0951   08/22/22 0952  Beta-hydroxybutyric acid  (Diabetes Ketoacidosis (DKA))  Now then every 8 hours,   STAT (with TIMED, URGENT  occurrences)      08/22/22 0951   08/22/22 0848  Blood gas, venous  Once,   R        08/22/22 0847            Vitals/Pain Today's Vitals   08/22/22 1300 08/22/22 1330 08/22/22 1400 08/22/22 1430  BP: (!) 160/69 (!) 171/72 (!) 166/68 (!) 153/69  Pulse: 84 84 79 86  Resp: 15 11 11 17  $ Temp:      TempSrc:      SpO2: 100% 100% 100% 100%   Weight:      Height:      PainSc:        Isolation Precautions No active isolations  Medications Medications  insulin regular, human (MYXREDLIN) 100 units/ 100 mL infusion (10 Units/hr Intravenous Rate/Dose Change 08/22/22 1111)  lactated ringers infusion (has no administration in time range)  dextrose 5 % in lactated ringers infusion (has no administration in time range)  dextrose 50 % solution 0-50 mL (has no administration in time range)  enoxaparin (LOVENOX) injection 40 mg (has no administration in time range)  acetaminophen (TYLENOL) tablet 650 mg (has no administration in time range)    Or  acetaminophen (TYLENOL) suppository 650 mg (has no administration in time range)  senna-docusate (Senokot-S) tablet 1 tablet (has no administration in time range)  benazepril (LOTENSIN) tablet 40 mg (has no administration in time range)  metoprolol succinate (TOPROL-XL) 24 hr tablet 25 mg (has no administration in time range)  simvastatin (ZOCOR) tablet 20 mg (has no administration in time range)  gabapentin (NEURONTIN) capsule 300 mg (has no administration in time range)  lactated ringers bolus 1,000 mL (0 mLs Intravenous Stopped 08/22/22 1044)  ondansetron (ZOFRAN) injection 4 mg (4 mg Intravenous Given 08/22/22 0924)  lactated ringers bolus 1,478 mL (0 mLs Intravenous Stopped 08/22/22 1349)    Mobility walks     Focused Assessments    R Recommendations: See Admitting Provider Note  Report given to:   Additional Notes:

## 2022-08-22 NOTE — Progress Notes (Signed)
Last CBG was 185,MD made aware

## 2022-08-23 DIAGNOSIS — Z794 Long term (current) use of insulin: Secondary | ICD-10-CM

## 2022-08-23 DIAGNOSIS — E1010 Type 1 diabetes mellitus with ketoacidosis without coma: Secondary | ICD-10-CM

## 2022-08-23 LAB — GLUCOSE, CAPILLARY
Glucose-Capillary: 248 mg/dL — ABNORMAL HIGH (ref 70–99)
Glucose-Capillary: 115 mg/dL — ABNORMAL HIGH (ref 70–99)
Glucose-Capillary: 55 mg/dL — ABNORMAL LOW (ref 70–99)
Glucose-Capillary: 246 mg/dL — ABNORMAL HIGH (ref 70–99)
Glucose-Capillary: 365 mg/dL — ABNORMAL HIGH (ref 70–99)
Glucose-Capillary: 263 mg/dL — ABNORMAL HIGH (ref 70–99)
Glucose-Capillary: 147 mg/dL — ABNORMAL HIGH (ref 70–99)
Glucose-Capillary: 158 mg/dL — ABNORMAL HIGH (ref 70–99)
Glucose-Capillary: 256 mg/dL — ABNORMAL HIGH (ref 70–99)

## 2022-08-23 LAB — BASIC METABOLIC PANEL
Anion gap: 9 (ref 5–15)
BUN: 45 mg/dL — ABNORMAL HIGH (ref 6–20)
CO2: 24 mmol/L (ref 22–32)
Calcium: 8.5 mg/dL — ABNORMAL LOW (ref 8.9–10.3)
Chloride: 101 mmol/L (ref 98–111)
Creatinine, Ser: 2.66 mg/dL — ABNORMAL HIGH (ref 0.61–1.24)
GFR, Estimated: 29 mL/min — ABNORMAL LOW (ref >60)
Glucose, Bld: 242 mg/dL — ABNORMAL HIGH (ref 70–99)
Potassium: 4.2 mmol/L (ref 3.5–5.1)
Sodium: 134 mmol/L — ABNORMAL LOW (ref 135–145)

## 2022-08-23 LAB — BETA-HYDROXYBUTYRIC ACID: Beta-Hydroxybutyric Acid: 0.34 mmol/L — ABNORMAL HIGH (ref 0.05–0.27)

## 2022-08-23 MED ORDER — INSULIN ASPART 100 UNIT/ML IJ SOLN
3.0000 [IU] | Freq: Three times a day (TID) | INTRAMUSCULAR | Status: DC
  Administered 2022-08-23 – 2022-08-24 (×3): 3 [IU] via SUBCUTANEOUS

## 2022-08-23 MED ORDER — INSULIN GLARGINE-YFGN 100 UNIT/ML ~~LOC~~ SOLN
16.0000 [IU] | Freq: Every day | SUBCUTANEOUS | Status: DC
  Administered 2022-08-23: 16 [IU] via SUBCUTANEOUS
  Filled 2022-08-23 (×2): qty 0.16

## 2022-08-23 MED ORDER — LACTATED RINGERS IV SOLN: INTRAVENOUS | Status: AC

## 2022-08-23 MED ORDER — INSULIN ASPART 100 UNIT/ML IJ SOLN
0.0000 [IU] | INTRAMUSCULAR | Status: DC
  Administered 2022-08-23: 15 [IU] via SUBCUTANEOUS
  Administered 2022-08-23: 5 [IU] via SUBCUTANEOUS
  Administered 2022-08-23: 2 [IU] via SUBCUTANEOUS
  Administered 2022-08-23: 3 [IU] via SUBCUTANEOUS
  Administered 2022-08-23: 8 [IU] via SUBCUTANEOUS
  Administered 2022-08-24: 2 [IU] via SUBCUTANEOUS
  Administered 2022-08-24: 3 [IU] via SUBCUTANEOUS

## 2022-08-23 NOTE — Progress Notes (Signed)
Hypoglycemic Event  CBG: 55  Treatment: 8 oz juice/soda  Symptoms: Shaky  Follow-up CBG: [id number] CBG Result:246  Possible Reasons for Event: Unknown    Dominique Ressel M Sally-Anne Wamble

## 2022-08-23 NOTE — Evaluation (Signed)
Physical Therapy Evaluation & Discharge Patient Details Name: [name] MRN: [id number] DOB: [birth year] Today's Date: 08/23/2022  History of Present Illness  Pt is a 47 y.o. male who presented 08/22/22 with hyperglycemia, nausea and vomiting in the setting of being unable to obtain his insulin for the last 2 days. Admitted with DKA and AKI on CKD stage IIIa. PMH: DM1, HTN, CKD stage IIIa, postherpetic neuralgia, seizures   Clinical Impression  Pt presents with condition above. Pt currently lives with a roommate in a 2nd floor apartment. Pt is functioning at his baseline currently, mobilizing independently without overt LOB, assistance, or UE support. He does ambulate at a slightly slowed pace, but this could be his self-selected baseline gait speed. All education completed and questions answered. Pt instructed on how to manage lines to ambulate on his own in the halls, RN made aware. PT will sign off.      Recommendations for follow up therapy are one component of a multi-disciplinary discharge planning process, led by the attending physician.  Recommendations may be updated based on patient status, additional functional criteria and insurance authorization.  Follow Up Recommendations No PT follow up      Assistance Recommended at Discharge None  Patient can return home with the following       Equipment Recommendations None recommended by PT  Recommendations for Other Services       Functional Status Assessment Patient has not had a recent decline in their functional status     Precautions / Restrictions Precautions Precautions: Other (comment) Precaution Comments: bradycardia Restrictions Weight Bearing Restrictions: No      Mobility  Bed Mobility Overal bed mobility: Modified Independent             General bed mobility comments: HOB elevated, no assistance needed    Transfers Overall transfer level: Independent Equipment used: None                General transfer comment: No assistance needed, no LOB    Ambulation/Gait Ambulation/Gait assistance: Modified independent (Device/Increase time) Gait Distance (Feet): 440 Feet Assistive device: None Gait Pattern/deviations: WFL(Within Functional Limits) Gait velocity: reduced slightly Gait velocity interpretation: >2.62 ft/sec, indicative of community ambulatory   General Gait Details: Pt with slightly slowed gait, but no significant gait deviations noted and no overt LOB.  Stairs            Wheelchair Mobility    Modified Rankin (Stroke Patients Only)       Balance Overall balance assessment: No apparent balance deficits (not formally assessed)                                           Pertinent Vitals/Pain Pain Assessment Pain Assessment: Faces Faces Pain Scale: Hurts a little bit Pain Location: L side of abdomen/trunk where pt reports he had shingles virus recently Pain Descriptors / Indicators: Discomfort Pain Intervention(s): Monitored during session    Home Living Family/patient expects to be discharged to:: Private residence Living Arrangements: Non-relatives/Friends (roommate currently, but likely may move to live alone by end of March) Available Help at Discharge: Other (Comment);Available PRN/intermittently (roommate, possibly only until end of March) Type of Home: Apartment Home Access: Stairs to enter Entrance Stairs-Rails: Left (ascending) Entrance Stairs-Number of Steps: 14 (at minimum (2nd floor apartment))   Home Layout: One level Home Equipment: None      Prior  Function Prior Level of Function : Independent/Modified Independent;Driving;Working/employed             Mobility Comments: No AD. No falls. ADLs Comments: Works as an Animator        Extremity/Trunk Assessment   Upper Extremity Assessment Upper Extremity Assessment: Overall WFL for tasks assessed    Lower  Extremity Assessment Lower Extremity Assessment: Overall WFL for tasks assessed    Cervical / Trunk Assessment Cervical / Trunk Assessment: Normal  Communication   Communication: No difficulties  Cognition Arousal/Alertness: Awake/alert Behavior During Therapy: WFL for tasks assessed/performed Overall Cognitive Status: Within Functional Limits for tasks assessed                                          General Comments General comments (skin integrity, edema, etc.): HR as low as 48 bpm at rest, up to 54 with static standing; BP stable with positional changes, ranging 128-132/73; educated pt on how to disconnect pulse ox and manage IV to ambulate independently in halls, RN made aware    Exercises     Assessment/Plan    PT Assessment Patient does not need any further PT services  PT Problem List         PT Treatment Interventions      PT Goals (Current goals can be found in the Care Plan section)  Acute Rehab PT Goals Patient Stated Goal: to get a new home soon PT Goal Formulation: All assessment and education complete, DC therapy Time For Goal Achievement: 08/24/22 Potential to Achieve Goals: Good    Frequency       Co-evaluation               AM-PAC PT "6 Clicks" Mobility  Outcome Measure Help needed turning from your back to your side while in a flat bed without using bedrails?: None Help needed moving from lying on your back to sitting on the side of a flat bed without using bedrails?: None Help needed moving to and from a bed to a chair (including a wheelchair)?: None Help needed standing up from a chair using your arms (e.g., wheelchair or bedside chair)?: None Help needed to walk in hospital room?: None Help needed climbing 3-5 steps with a railing? : None 6 Click Score: 24    End of Session   Activity Tolerance: Patient tolerated treatment well Patient left: in bed;with call bell/phone within reach Nurse Communication: Mobility  status PT Visit Diagnosis: Pain Pain - Right/Left: Left Pain - part of body:  (abdomen/trunk)    [id number] PT Time Calculation (min) (ACUTE ONLY): 19 min   Charges:   PT Evaluation $PT Eval Low Complexity: 1 Low          Moishe Spice, PT, DPT Acute Rehabilitation Services  Office: [phone]   Orvan Falconer 08/23/2022, 4:52 PM

## 2022-08-23 NOTE — Inpatient Diabetes Management (Addendum)
Inpatient Diabetes Program Recommendations  AACE/ADA: New Consensus Statement on Inpatient Glycemic Control (2015)  Target Ranges:  Prepandial:   less than 140 mg/dL      Peak postprandial:   less than 180 mg/dL (1-2 hours)      Critically ill patients:  140 - 180 mg/dL   Lab Results  Component Value Date   GLUCAP 246 (H) 08/23/2022   HGBA1C 8.9 (H) 08/22/2022    Review of Glycemic Control  Latest Reference Range & Units 08/23/22 01:52 08/23/22 04:10 08/23/22 08:02 08/23/22 09:32  Glucose-Capillary 70 - 99 mg/dL 248 (H) 115 (H) 55 (L) 246 (H)  (H): Data is abnormally high (L): Data is abnormally low Diabetes history: DM1 Outpatient Diabetes medications: Basaglar 16 units QAM, Basaglar 14 units QHS, Novolog 1 unit per 15 grams of carbs, Novolog 1 unit per 50 mg/dl  Current orders for Inpatient glycemic control: Semglee 20 units QHS, Novolog 0-15 units Q4H   Inpatient Diabetes Program Recommendations:    Noted hypoglycemia of 55 mg/dL. Patient transitioned without basal overlap.   Would recommend: -Decreasing Semglee 16 units QHS - Changing correction to Novolog 0-9 units TID & HS -Adding Novolog 3 units TID (assuming patient is consuming >50% of meals)  Spoke with patient regarding outpatient diabetes and to reinforce discussion with DM coordinator yesterday. Provided Dexcom sensors and encouraged patient to apply on day of discharge. Reviewed Novolin 70/30 with patient, how to obtain, cost, when to inject and to ensure meal intake. Also, reviewed questions to ask provider regarding dosage if he gets in a bind. No further questions asked, patient appreciative of visit.   Thanks, Bronson Curb, MSN, RNC-OB Diabetes Coordinator [phone] (8a-5p)

## 2022-08-23 NOTE — Progress Notes (Addendum)
Internal Medicine Attending:   I saw and examined the patient. I reviewed the resident's H&P note and I agree with the resident's findings and plan as documented in the resident's note.  In brief, patient is a 47 year old male with past medical history of type 1 diabetes, hypertension, CKD stage IIIa and postherpetic neuralgia who presented to ED with hyperglycemia, nausea and vomiting in the setting of being unable to obtain his insulin for the last 2 days.  Patient states that he lost his insurance approximately 4 months ago and has been paying for his insulin out-of-pocket.  He has been unable to afford his Dexcom sensors for the last 2 months and lost the rest of his insulin 3 days ago when attempting to move to a new place.  Patient since then has had increasing fatigue, polydipsia and polyuria as well as nausea and vomiting since yesterday.  He has not had any oral intake in 2 days.  In the ED, patient was noted to have an elevated anion gap of 22 with a bicarb of 12 and an elevated blood glucose of 549.  He was admitted to the hospital for further management of his DKA.  Today, patient states that he feels much better and denies any new complaints.  Vitals:   08/23/22 0800 08/23/22 1125  BP: (!) 108/52 111/61  Pulse: (!) 55 (!) 51  Resp: 13 15  Temp: 98 F (36.7 C) (!) 97.4 F (36.3 C)  SpO2: 100% 100%    On exam, patient is lying in bed in no apparent distress.  Lungs clear to auscultation bilaterally.  Abdomen is soft, nontender, nondistended with normoactive bowel sounds.  Cardiovascular exam reveals regular and rhythm with normal heart sounds.  Lower extremities are nontender to palpation with no edema noted.  Patient is oriented x 3 with normal mood and affect.  Assessment and Plan:  DKA/diabetes with severe hyperglycemia: -Patient has history of type 1 diabetes and ran out of his insulin 2 to 3 days prior to his admission and presented to the ED with polydipsia, polyuria, nausea  and vomiting as well as increased fatigue and was noted to have severe hyperglycemia with an anion gap metabolic acidosis and elevated beta hydroxybutyric acid up to 7 consistent with DKA.   -Patient was started on insulin drip and his anion gap closed. -He was transition to long-acting and Premeal insulin overnight after his anion gap had closed.  Patient did have an episode of hypoglycemia today (55 mg/dL) likely secondary to an increased dose of Semglee.  Will decrease his Semglee to 16 units and add 3 units NovoLog Premeal. -Patient's beta hydroxybutyric acid level decreased from 7-0.34 and his bicarbonate improved from 12 to 24 -No further workup at this time -Will continue to monitor blood sugars closely -Patient will need assistance obtaining insulin prior to discharge  2.  AKI on CKD stage IIIa: -Patient has baseline creatinine of around 1.3-1.5 and his creatinine on admission was 3.09 likely secondary to dehydration in setting of DKA.  Creatinine initially improved to 2.4 with IV fluids but currently is at 2.6. -Will hold enalapril given his AKI.  Will also hold metoprolol as his blood pressures have been soft -Patient with good appetite today and is eating and drinking appropriately. Will resume IV fluids today given his AKI.  Recheck BMP in a.m. -No further workup at this time

## 2022-08-23 NOTE — Progress Notes (Addendum)
Subjective:   Summary: [name] is a 47 y.o. year old male currently admitted on the IMTS HD#1 for DKA.  Overnight Events: Transitioned off insulin drip after Semglee 20 administration.  He did have one hypoglycemic episode at 0800 this morning.  He feels much better at the time of our assessment.  No chest pain, dyspnea, further nausea or vomiting, diarrhea.  Minimal abdominal pain.  He does endorse continued postherpetic neuralgia.   Objective:  Vital signs in last 24 hours: Vitals:   08/22/22 2300 08/22/22 2348 08/23/22 0000 08/23/22 0425  BP:  (!) 106/52  (!) 92/48  Pulse: (!) 57 61 60 60  Resp: 15 16 15 16  $ Temp:  98.4 F (36.9 C)  98 F (36.7 C)  TempSrc:  Oral  Oral  SpO2: 100% 100% 100% 100%  Weight:      Height:       Supplemental O2: Room Air SpO2: 100 %   Physical Exam:  Constitutional: NAD Cardiovascular: RRR, no murmurs, rubs or gallops Pulmonary/Chest: normal work of breathing on room air, lungs clear to auscultation bilaterally.  Postherpetic scarring over left chest wall/back.  No active lesions. Abdominal: soft, non-tender, non-distended Skin: warm and dry Extremities: upper/lower extremity pulses 2+, no lower extremity edema present  [facility] Weights   08/22/22 0843  Weight: 73.9 kg     Intake/Output Summary (Last 24 hours) at 08/23/2022 0751 Last data filed at 08/23/2022 0000 Gross per 24 hour  Intake 3383.96 ml  Output 850 ml  Net 2533.96 ml   Net IO Since Admission: 2,533.96 mL [08/23/22 0751]  Pertinent Labs:    Latest Ref Rng & Units 08/22/2022   10:46 AM 08/22/2022   10:24 AM 08/22/2022    9:15 AM  CBC  WBC 4.0 - 10.5 K/uL  16.1    Hemoglobin 13.0 - 17.0 g/dL 13.9  13.9  14.3   Hematocrit 39.0 - 52.0 % 41.0  41.2  42.0   Platelets 150 - 400 K/uL  287         Latest Ref Rng & Units 08/23/2022    1:38 AM 08/22/2022    9:30 PM 08/22/2022    5:51 PM  CMP  Glucose 70 - 99 mg/dL 242  115  224   BUN 6 - 20 mg/dL  45  43  45   Creatinine 0.61 - 1.24 mg/dL 2.66  2.46  2.57   Sodium 135 - 145 mmol/L 134  138  135   Potassium 3.5 - 5.1 mmol/L 4.2  4.2  4.1   Chloride 98 - 111 mmol/L 101  104  102   CO2 22 - 32 mmol/L 24  24  21   $ Calcium 8.9 - 10.3 mg/dL 8.5  9.2  9.1       Imaging:   EKG:   Assessment/Plan:   Patient Summary: #Diabetic ketoacidosis #Uncontrolled type 1 diabetes Transition off insulin drip appropriately overnight after administration of Semglee 20.  Tums are significantly improved.  He did have 1 hypoglycemic episode this morning, but otherwise CBGs have been elevated.  Carb-modified diet started. -Will change insulin regimen to Semglee 16, NovoLog 3 3 times daily with meals, SSI ACHS - Will continue with IV fluids for now -Diabetes coordinator following, appreciate assistance.  Will try to get him a Dexcom -TOC following, appreciate assistance with medications   #Hypertension Blood pressure softer overnight.  Will  hold home antihypertensives in the setting of borderline hypotension and AKI as below. -Will hold benazepril and metoprolol for now given soft blood pressures as well as AKI   #AKI on CKD IIIA On chart review, baseline creatinine around 1.3-1.5. BMP on arrival showed increasing creatinine to 3.09.  AKI prerenal in the setting of poor p.o. intake and DKA with osmotic diuresis.  Creatinine this morning 2.66.  Will hold home benazepril as above and continue fluid resuscitation and encourage p.o. intake as well. -Continue IV fluids -Trend BMP as above -Avoid nephrotoxic agents, holding benazepril.   #Postherpetic neuralgia Stable postherpetic neuralgia with some relief from gabapentin which was started yesterday -Continue gabapentin 300 mg daily and titrate up as needed for neuropathic pain   #Hyperlipidemia On simvastatin. Last lipid panel 9 months ago showed LDL of 147.  LDL here 170.  May need titration of statin outpatient. -Resume Simvastatin 20 mg  daily  Diet: Carb-Modified IVF: LR,100cc/hr VTE: Enoxaparin Code: Full PT/OT recs: Pending TOC recs:  Family Update:   Dispo: Anticipated discharge pending resolution of AKI, stabilization of CBGs and insulin PT/OT eval   Linus Galas, MD PGY-1 Internal Medicine Resident Please contact the on call pager after 5 pm and on weekends at [phone].

## 2022-08-24 ENCOUNTER — Other Ambulatory Visit (HOSPITAL_COMMUNITY): Payer: Self-pay

## 2022-08-24 LAB — BASIC METABOLIC PANEL
Anion gap: 7 (ref 5–15)
BUN: 37 mg/dL — ABNORMAL HIGH (ref 6–20)
CO2: 24 mmol/L (ref 22–32)
Calcium: 8.3 mg/dL — ABNORMAL LOW (ref 8.9–10.3)
Chloride: 103 mmol/L (ref 98–111)
Creatinine, Ser: 2.07 mg/dL — ABNORMAL HIGH (ref 0.61–1.24)
GFR, Estimated: 40 mL/min — ABNORMAL LOW (ref >60)
Glucose, Bld: 161 mg/dL — ABNORMAL HIGH (ref 70–99)
Potassium: 3.7 mmol/L (ref 3.5–5.1)
Sodium: 134 mmol/L — ABNORMAL LOW (ref 135–145)

## 2022-08-24 LAB — GLUCOSE, CAPILLARY
Glucose-Capillary: 132 mg/dL — ABNORMAL HIGH (ref 70–99)
Glucose-Capillary: 135 mg/dL — ABNORMAL HIGH (ref 70–99)
Glucose-Capillary: 212 mg/dL — ABNORMAL HIGH (ref 70–99)
Glucose-Capillary: 247 mg/dL — ABNORMAL HIGH (ref 70–99)

## 2022-08-24 MED ORDER — GABAPENTIN 300 MG PO CAPS
300.0000 mg | ORAL_CAPSULE | Freq: Every day | ORAL | 0 refills | Status: DC
  Filled 2022-08-24: qty 30, 30d supply, fill #0

## 2022-08-24 MED ORDER — INSULIN ISOPHANE & REGULAR (HUMAN 70-30)100 UNIT/ML KWIKPEN
15.0000 [IU] | PEN_INJECTOR | Freq: Two times a day (BID) | SUBCUTANEOUS | 11 refills | Status: DC
  Filled 2022-08-24: qty 15, 50d supply, fill #0

## 2022-08-24 MED ORDER — INSULIN PEN NEEDLE 32G X 4 MM MISC
0 refills | Status: DC
  Filled 2022-08-24: qty 100, 30d supply, fill #0

## 2022-08-24 MED ORDER — INSULIN ISOPHANE & REGULAR (HUMAN 70-30)100 UNIT/ML KWIKPEN
15.0000 [IU] | PEN_INJECTOR | Freq: Two times a day (BID) | SUBCUTANEOUS | 1 refills | Status: DC
  Filled 2022-08-24: qty 9, 30d supply, fill #0

## 2022-08-24 NOTE — Progress Notes (Incomplete)
Subjective:   Summary: [name] is a 47 y.o. year old male currently admitted on the IMTS HD#2 for DKA.  Overnight Events:  NAEON.  Received Semglee 16 last night and NovoLog 15, 5, 3, 8 for ACHS coverage yesterday.  Had some elevated glucose during the day yesterday, but fasting this morning is adequately controlled.   Objective:  Vital signs in last 24 hours: Vitals:   08/23/22 2002 08/23/22 2320 08/24/22 0023 08/24/22 0436  BP: 132/76 (!) 168/87 (!) 152/85 (!) 140/76  Pulse: (!) 49 (!) 56 (!) 51 (!) 53  Resp: 16 16 13 15  $ Temp: 98.1 F (36.7 C) 98.1 F (36.7 C) 98.1 F (36.7 C) 97.8 F (36.6 C)  TempSrc: Oral Oral Oral Oral  SpO2: 100% 100% 99% 99%  Weight:      Height:       Supplemental O2: Room Air SpO2: 99 %   Physical Exam:  Constitutional: NAD Cardiovascular: RRR, no murmurs, rubs or gallops Pulmonary/Chest: normal work of breathing on room air, lungs clear to auscultation bilaterally.  Postherpetic scarring over left chest wall/back.  No active lesions. Abdominal: soft, non-tender, non-distended Skin: warm and dry Extremities: upper/lower extremity pulses 2+, no lower extremity edema present  [facility] Weights   08/22/22 0843  Weight: 73.9 kg     Intake/Output Summary (Last 24 hours) at 08/24/2022 0651 Last data filed at 08/23/2022 2350 Gross per 24 hour  Intake 398.98 ml  Output --  Net 398.98 ml    Net IO Since Admission: 2,932.94 mL [08/24/22 0651]  Pertinent Labs:    Latest Ref Rng & Units 08/22/2022   10:46 AM 08/22/2022   10:24 AM 08/22/2022    9:15 AM  CBC  WBC 4.0 - 10.5 K/uL  16.1    Hemoglobin 13.0 - 17.0 g/dL 13.9  13.9  14.3   Hematocrit 39.0 - 52.0 % 41.0  41.2  42.0   Platelets 150 - 400 K/uL  287         Latest Ref Rng & Units 08/24/2022    2:17 AM 08/23/2022    1:38 AM 08/22/2022    9:30 PM  CMP  Glucose 70 - 99 mg/dL 161  242  115   BUN 6 - 20 mg/dL 37  45  43   Creatinine 0.61 - 1.24 mg/dL 2.07  2.66   2.46   Sodium 135 - 145 mmol/L 134  134  138   Potassium 3.5 - 5.1 mmol/L 3.7  4.2  4.2   Chloride 98 - 111 mmol/L 103  101  104   CO2 22 - 32 mmol/L 24  24  24   $ Calcium 8.9 - 10.3 mg/dL 8.3  8.5  9.2       Imaging:   EKG:   Assessment/Plan:   Patient Summary: #Diabetic ketoacidosis #Uncontrolled type 1 diabetes Transition off insulin drip appropriately overnight after administration of Semglee 20.  Tums are significantly improved.  He did have 1 hypoglycemic episode this morning, but otherwise CBGs have been elevated.  Carb-modified diet started. -Will change insulin regimen to Semglee 16, NovoLog 3 3 times daily with meals, SSI ACHS - Will continue with IV fluids for now -Diabetes coordinator following, appreciate assistance.  Will try to get him a Dexcom -TOC following, appreciate assistance with medications   #Hypertension Blood pressure softer overnight.  Will hold home antihypertensives in the setting of  borderline hypotension and AKI as below.  Normotensive to hypertensive overnight.  Continues to be bradycardic despite not receiving metoprolol. -Will hold benazepril and metoprolol for now given soft blood pressures as well as AKI   #AKI on CKD IIIA On chart review, baseline creatinine around 1.3-1.5. BMP on arrival showed increasing creatinine to 3.09.  AKI prerenal in the setting of poor p.o. intake and DKA with osmotic diuresis.  Creatinine this morning approximately 2.  Will hold home benazepril as above and continue fluid resuscitation and encourage p.o. intake as well. -Continue IV fluids -Trend BMP as above -Avoid nephrotoxic agents, holding benazepril.   #Postherpetic neuralgia Stable postherpetic neuralgia with some relief from gabapentin which was started yesterday -Continue gabapentin 300 mg daily and titrate up as needed for neuropathic pain   #Hyperlipidemia On simvastatin. Last lipid panel 9 months ago showed LDL of 147.  LDL here 170.  May need  titration of statin outpatient. -Resume Simvastatin 20 mg daily  Diet: Carb-Modified IVF: LR,100cc/hr VTE: Enoxaparin Code: Full PT/OT recs: Pending TOC recs:  Family Update:   Dispo: Anticipated discharge pending resolution of AKI, stabilization of CBGs and insulin PT/OT eval   Linus Galas, MD PGY-1 Internal Medicine Resident Please contact the on call pager after 5 pm and on weekends at [phone].

## 2022-08-24 NOTE — Progress Notes (Signed)
Discharge instructions provided to patient. IV out. Monitor off. Discharging to home.

## 2022-08-24 NOTE — Hospital Course (Signed)
He is feeling fine today. Has not been having any further nausea or vomiting. We discussed switching to Novolin 70/30 insulin at discharge. He feels ready to go home.

## 2022-08-24 NOTE — Discharge Summary (Signed)
Name: [name] MRN: [id number] DOB: 15-Jan-1976 47 y.o. PCP: No primary care provider on file.  Date of Admission: 08/22/2022  8:36 AM Date of Discharge: 08/24/2022 Attending Physician: Dr. Dareen Piano  Discharge Diagnosis: Principal Problem:   DKA (diabetic ketoacidosis) [facility])    Discharge Medications: Allergies as of 08/24/2022       Reactions   Geodon [ziprasidone Hcl] Other (See Comments)   Severe muscle spasms (ENTIRE BODY)- had to call EMS   Ziprasidone Mesylate Other (See Comments)   "severe muscle spasms"- Had to call EMS   Amlodipine Swelling        Medication List     STOP taking these medications    Basaglar KwikPen 100 UNIT/ML   benazepril 40 MG tablet Commonly known as: LOTENSIN   metoprolol succinate 25 MG 24 hr tablet Commonly known as: TOPROL-XL   NovoLOG FlexPen 100 UNIT/ML FlexPen Generic drug: insulin aspart   oxyCODONE 5 MG immediate release tablet Commonly known as: Roxicodone       TAKE these medications    B-D UF III MINI PEN NEEDLES 31G X 5 MM Misc Generic drug: Insulin Pen Needle Insert into the skin as prescribed What changed: Another medication with the same name was added. Make sure you understand how and when to take each.   TechLite Pen Needles 32G X 4 MM Misc Generic drug: Insulin Pen Needle Use with Humulin 70/30 kwikpen What changed: You were already taking a medication with the same name, and this prescription was added. Make sure you understand how and when to take each.   [employer] Advanced BP Monitor Devi 1 Device by Does not apply route daily.   FLUoxetine 10 MG capsule Commonly known as: PROZAC Take 1 capsule (10 mg total) by mouth daily.   FreeStyle Libre 2 Reader [place] Use to check blood sugar three times daily. E10.8   FreeStyle Libre 2 Sensor Misc Use to check blood sugar three times daily. Change sensor every 2 weeks. E10.8   gabapentin 300 MG capsule Commonly known as: NEURONTIN Take 1 capsule (300 mg  total) by mouth daily. Start taking on: August 25, 2022 What changed:  medication strength how much to take when to take this reasons to take this   HumuLIN 70/30 KwikPen (70-30) 100 UNIT/ML KwikPen Generic drug: insulin isophane & regular human KwikPen Inject 15 Units into the skin 2 (two) times daily with a meal. Take with two large meals daily, ideally breakfast and dinner or lunch and dinner.   ibuprofen 800 MG tablet Commonly known as: ADVIL Take 1 tablet (800 mg total) by mouth every 8 (eight) hours as needed. What changed: reasons to take this   methocarbamol 500 MG tablet Commonly known as: ROBAXIN Take 1 tablet (500 mg total) by mouth every 8 (eight) hours as needed for muscle spasms.   pantoprazole 40 MG tablet Commonly known as: Protonix Take 1 tablet (40 mg total) by mouth daily.   simvastatin 20 MG tablet Commonly known as: ZOCOR TAKE ONE TABLET BY MOUTH at bedtime What changed: when to take this   triamcinolone cream 0.1 % Commonly known as: KENALOG Apply 1 Application topically 2 (two) times daily. Do not use for longer than 2 weeks in a row in any 1 location   True Metrix Blood Glucose Test test strip Generic drug: glucose blood Use as instructed. MUST MAKE APPT FOR FURTHER REFILLS   TRUEplus Lancets 28G Misc use as directed 3 (three) times daily.  Disposition and follow-up:   Mr.[name] was discharged from [facility] in Good condition.  At the hospital follow up visit please address:  1.  Follow-up:  a. Titrate insulin regimen as needed    b. Antihypertensives held on dc. Metoprolol held because of  bradycardia, benazepril held in context of AKI. Of note, he has amlodipine reaction on chart. Can restart benazepril when AKI resolves but he might need additional agents.   c. Please follow up to make sure kidney function normalizes  D. Titrate gabapentin as needed for postherpetic neuralgia  E. Change statin as  needed   2.  Labs / imaging needed at time of follow-up: BMP, CBG  3.  Pending labs/ test needing follow-up: none  Follow-up Appointments:  Follow-up Information     Tiro. Go on 08/29/2022.   Contact information: [address] [place] Hospital Course by problem list: [name] is a 47 y.o. M with PMH T1DM, HTN, CKDIIIA, postherpetic neuralgia who presented with DKA  #Diabetic ketoacidosis #Uncontrolled type 1 diabetes Patient with a history of type 1 diabetes presented with 3 days of increased fatigue, nausea/vomiting, polyuria, and polydipsia after running out of insulin. He lost insurance a few months back after switching jobs and is still waiting getting insurance through his new job. Admission labs c/w DKA. Initially fluid resuscitated and started on insulin drip with resolution of AG on first night of admission. Transitioned off drip with semglee 20, though he did have a hypoglycemic event the morning after. Regimen changed to semglee 16 and novolog 3 meal coverage + ACHS SSI. He felt much better and was tolerating PO intake after starting insulin regimen. Due to cost constraints, opted to discharge on humalin 70/30 15u BID which can be further titrated outpatient. Can possibly return to lantus + novolog in the future if financially feasible.  #Hypertension Home benazepril and metoprolol initially continued on admission but then stopped given AKI and bradycardia. He was normotensive to low hypertensive at time of dc. Regimen will have to be modified at follow up. Can restart ACEi, but he might need other agent as well. Would not recommend continuing beta blocker considering his bradycardia.   #AKI on CKD IIIA On chart review, baseline creatinine around 1.3-1.5. BMP on arrival showed increasing creatinine to 3.09.  AKI prerenal in the setting of poor p.o. intake and DKA with osmotic  diuresis.  Cr improved with fluids, holding ACEi. Cr 2.07 on day of dc, follow up outpatient to ensure resolution.   #Postherpetic neuralgia Stable postherpetic neuralgia with some relief from gabapentin which was started during admission, titrate outpatient as needed.   #Hyperlipidemia On simvastatin 77m. Last lipid panel 9 months ago showed LDL of 147.  LDL here 170.  Will need titration of statin outpatient, unsure if he had been able to take simvastatin consistently the past few months.  Subjective: He is feeling fine today. Has not been having any further nausea or vomiting. We discussed switching to 70/30 insulin at discharge to assist with cost. He feels ready to go home.    Discharge Vitals:   BP (!) 140/76 (BP Location: Left Arm)   Pulse 68   Temp 98.1 F (36.7 C) (Oral)   Resp 18   Ht 5' 11"$  (1.803 m)   Wt 73.9 kg   SpO2 100%  BMI 22.73 kg/m  Discharge exam: Constitutional: NAD Cardiovascular: RRR, no murmurs, rubs or gallops Pulmonary/Chest: normal work of breathing on room air, lungs clear to auscultation bilaterally.   Abdominal: soft, non-tender, non-distended Skin: warm and dry. Postherpetic scarring over left chest wall/back.  No active lesions. Extremities: upper/lower extremity pulses 2+, no lower extremity edema present   Pertinent Labs, Studies, and Procedures:     Latest Ref Rng & Units 08/22/2022   10:46 AM 08/22/2022   10:24 AM 08/22/2022    9:15 AM  CBC  WBC 4.0 - 10.5 K/uL  16.1    Hemoglobin 13.0 - 17.0 g/dL 13.9  13.9  14.3   Hematocrit 39.0 - 52.0 % 41.0  41.2  42.0   Platelets 150 - 400 K/uL  287         Latest Ref Rng & Units 08/24/2022    2:17 AM 08/23/2022    1:38 AM 08/22/2022    9:30 PM  CMP  Glucose 70 - 99 mg/dL 161  242  115   BUN 6 - 20 mg/dL 37  45  43   Creatinine 0.61 - 1.24 mg/dL 2.07  2.66  2.46   Sodium 135 - 145 mmol/L 134  134  138   Potassium 3.5 - 5.1 mmol/L 3.7  4.2  4.2   Chloride 98 - 111 mmol/L 103  101  104   CO2  22 - 32 mmol/L 24  24  24   $ Calcium 8.9 - 10.3 mg/dL 8.3  8.5  9.2       Discharge Instructions: Discharge Instructions     Diet - low sodium heart healthy   Complete by: As directed    Discharge instructions   Complete by: As directed    1.  We will be temporarily changing her insulin regimen to 70/30.  Please take 15 units with 2 large meals every day, ideally breakfast and dinner but lunch and dinner is also okay if you do not eat breakfast.  We could potentially change your regimen in the future back to long-acting insulin and short acting insulin if needed and if the cost is not too expensive. 2.  I will be discharging you with a supply of gabapentin, please ask [place] during your clinic follow-up if you need more.  If you notice any nausea or vomiting, confusion, severe weakness, trouble breathing please come back to the ED to be reevaluated. Otherwise, I have scheduled an appointment for you to follow up with [place] in our clinic on Wednesday 2/21 at 3:45. Our clinic is in the hospital so please arrive at the front and they will bring you to the right location.   Increase activity slowly   Complete by: As directed        Discharge Instructions   None     Signed: Linus Galas, MD 08/24/2022, 11:06 PM   Pager: [phone]

## 2022-08-24 NOTE — TOC Transition Note (Signed)
Transition of Care (TOC) - CM/SW Discharge Note Marvetta Gibbons RN, BSN Transitions of Care Unit 4E- RN Case Manager See Treatment Team for direct phone #   Patient Details  Name: [name] MRN: [id number] Date of Birth: [birth year]  Transition of Care [facility]) CM/SW Contact:  Dawayne Patricia, RN Phone Number: 08/24/2022, 2:43 PM   Clinical Narrative:    Pt stable for transition home today, f/u appointment made for [facility] 2/20 at 2:30pm- Per DM Coordinator - "Pt has been paying $75 for Basalgar and $25 for Novolog at [facility] but he just changed jobs and moved so he did not have the money to pay for it this time. He reports he wants to stay on [place] and Novolog and if he has an issue with cost in the future he plans to talk to [facility] about using 70/30 insulin. He will get insurance in May and he anticipates not having further issues."  CM will assist pt with his medications for discharge using [place] pharmacy to fill meds prior to discharge and deliver to room.   No further TOC needs noted.    Final next level of care: Home/Self Care Barriers to Discharge: No Barriers Identified   Patient Goals and CMS Choice   Choice offered to / list presented to : NA  Discharge Placement               Home          Discharge Plan and Services Additional resources added to the After Visit Summary for     Discharge Planning Services: [place] Program, Medication Assistance, CM Consult Post Acute Care Choice: NA          DME Arranged: N/A DME Agency: NA       HH Arranged: NA HH Agency: NA        Social Determinants of Health (SDOH) Interventions SDOH Screenings   Depression (PHQ2-9): High Risk (10/23/2021)  Tobacco Use: Low Risk  (06/22/2022)     Readmission Risk Interventions    08/24/2022    2:43 PM  Readmission Risk Prevention Plan  Post Dischage Appt Complete  Medication Screening Complete  Transportation Screening Complete

## 2022-08-27 NOTE — Telephone Encounter (Signed)
Is patient establishing care at one of our PCP clinics [facility], [place], [facility], [facility], [facility], [facility])? If so, his new pt appt must be completed and then I can proceed to help him apply for the [facility]. If not, he must apply directly with the [facility].

## 2022-08-28 ENCOUNTER — Ambulatory Visit (INDEPENDENT_AMBULATORY_CARE_PROVIDER_SITE_OTHER): Payer: Self-pay | Admitting: Primary Care

## 2022-08-29 NOTE — Progress Notes (Deleted)
D/c 2/14 for DKA. Lost insurance. Was on semglee 16 and novolog 3 +SSI but cost restraints.  T1DM A1c 8.9 08/2022 Humalin 70/30 15u BID Gabapentin 300 Freestyle libre Simvastatin 20  HTN @ D/C metop held due to brady and benazapril due to AKI -bmp today  Postherpetic neuralgia -gabapentin 347m daily, titrate as needed  CKD 3 Baseline 1.3-1.5  HLD Last LDL 9  170 in hospital Simvastatin 20=> high intensity?  Seizure disorder?  HCM Optho Urine microalbumin Foot exam

## 2022-09-05 ENCOUNTER — Ambulatory Visit (INDEPENDENT_AMBULATORY_CARE_PROVIDER_SITE_OTHER): Payer: Self-pay | Admitting: Primary Care

## 2022-09-11 NOTE — Telephone Encounter (Signed)
Called pt and he requested a CB at 4:40pm

## 2022-09-14 ENCOUNTER — Ambulatory Visit: Payer: Self-pay | Admitting: *Deleted

## 2022-09-14 NOTE — Telephone Encounter (Signed)
  Chief Complaint: bilateral feet/ankle swelling- not getting better with elevation Symptoms: swelling-feet and ankles- left is worse Frequency: 1 week Pertinent Negatives: Patient denies fever, chest pain, difficulty breathing, calf pain Disposition: [] ED /[x] Urgent Care (no appt availability in office) / [] Appointment(In office/virtual)/ []  [facility]/ [] Home Care/ [] Refused Recommended Disposition /[]  Mobile Bus/ []  Follow-up with PCP Additional Notes: Patient has yet to establish care- but does have hospital f/u scheduled- advised patient- UC until he can be seen- they can evaluate his feet/enkle swelling and bridge him with diabetic supplies until he is seen in office.

## 2022-09-14 NOTE — Telephone Encounter (Signed)
Summary: Swelling   Feet and ankles swelling  for the last 2 to 4 days         Reason for Disposition  [1] Thigh, calf, or ankle swelling AND [2] bilateral AND [3] 1 side is more swollen  Answer Assessment - Initial Assessment Questions 1. LOCATION: "Which ankle is swollen?" "Where is the swelling?"     Left ankle larger than Right- feet and ankle 2. ONSET: "When did the swelling start?"     1 week 3. SWELLING: "How bad is the swelling?" Or, "How large is it?" (e.g., mild, moderate, severe; size of localized swelling)    - NONE: No joint swelling.   - LOCALIZED: Localized; small area of puffy or swollen skin (e.g., insect bite, skin irritation).   - MILD: Joint looks or feels mildly swollen or puffy.   - MODERATE: Swollen; interferes with normal activities (e.g., work or school); decreased range of movement; may be limping.   - SEVERE: Very swollen; can't move swollen joint at all; limping a lot or unable to walk.     Mild/moderate 4. PAIN: "Is there any pain?" If Yes, ask: "How bad is it?" (Scale 1-10; or mild, moderate, severe)   - NONE (0): no pain.   - MILD (1-3): doesn't interfere with normal activities.    - MODERATE (4-7): interferes with normal activities (e.g., work or school) or awakens from sleep, limping.    - SEVERE (8-10): excruciating pain, unable to do any normal activities, unable to walk.      none 5. CAUSE: "What do you think caused the ankle swelling?"     Unsure 6. OTHER SYMPTOMS: "Do you have any other symptoms?" (e.g., fever, chest pain, difficulty breathing, calf pain)     no  Protocols used: Ankle Swelling-A-AH

## 2022-09-17 ENCOUNTER — Encounter (HOSPITAL_COMMUNITY): Payer: Self-pay

## 2022-09-17 ENCOUNTER — Ambulatory Visit (HOSPITAL_COMMUNITY)
Admission: EM | Admit: 2022-09-17 | Discharge: 2022-09-17 | Disposition: A | Discharge status: Home / Self Care | Payer: 59 | Attending: Internal Medicine | Admitting: Internal Medicine

## 2022-09-17 DIAGNOSIS — A059 Bacterial foodborne intoxication, unspecified: Secondary | ICD-10-CM | POA: Diagnosis not present

## 2022-09-17 DIAGNOSIS — R600 Localized edema: Secondary | ICD-10-CM

## 2022-09-17 MED ORDER — ONDANSETRON 4 MG PO TBDP: 4.0000 mg | ORAL_TABLET | Freq: Three times a day (TID) | ORAL | 0 refills | Status: DC | PRN

## 2022-09-17 NOTE — ED Triage Notes (Signed)
Pt c/o vomiting x4 since 3am and diarrhea x1. States ate at a different restaurant last night. States able to keep down water.   Pt c/o swelling lt foot for a wk. States hx of DKA. States his cbg was 229 this am prior to insulin.

## 2022-09-17 NOTE — Discharge Instructions (Signed)
Please wear compression stockings to help with leg swelling Please decrease salt intake to less than 3 g a day Will call you with recommendations if labs are abnormal Please continue to stay well-hydrated Avoid ibuprofen and other nonsteroidal anti-inflammatory drugs Return to urgent care if you have any other concerns.

## 2022-09-17 NOTE — ED Provider Notes (Signed)
[facility]    CSN: [id number] Arrival date & time: 09/17/22  1224      History   Chief Complaint Chief Complaint  Patient presents with   Emesis    HPI [name] is a 47 y.o. male comes to the urgent care with 1 day history of vomiting and diarrhea.  Patient recounts that his symptoms started several hours after eating in the restaurant.  Vomiting is associated with nausea.  He has had 4 episodes of nonbloody nonbilious vomiting.  Patient has 1 episode of diarrhea.  No abdominal distention or cramping.  No fever or chills.  Patient complains of lower extremity swelling of 1 week duration.  Patient has a history of diabetes mellitus type 2 currently managed with insulin.  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  No shortness of breath or wheezing. 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 Authorizing Provider  ondansetron (ZOFRAN-ODT) 4 MG disintegrating tablet  Take 1 tablet (4 mg total) by mouth every 8 (eight) hours as needed for nausea or vomiting. 09/17/22  Yes Konor Noren, Myrene Galas, MD  Blood Pressure Monitoring ([employer] ADVANCED BP MONITOR) DEVI 1 Device by Does not apply route daily. 03/20/22   Brunetta Jeans, PA-C  Continuous Blood Gluc Receiver (FREESTYLE LIBRE 2 READER) DEVI Use to check blood sugar three times daily. E10.8 03/07/22   Gildardo Pounds, NP  Continuous Blood Gluc Sensor (FREESTYLE LIBRE 2 SENSOR) MISC Use to check blood sugar three times daily. Change sensor every 2 weeks. E10.8 03/07/22   Charlott Rakes, MD  FLUoxetine (PROZAC) 10 MG capsule Take 1 capsule (10 mg total) by mouth daily. 02/02/22 02/02/23  [place], Rosezella [place] A, NP  gabapentin (NEURONTIN) 300 MG capsule Take 1 capsule (300 mg total) by mouth daily. 08/25/22   Linus Galas, MD  glucose blood test strip Use as instructed. MUST MAKE APPT FOR FURTHER REFILLS 07/26/21   Argentina Donovan, PA-C  ibuprofen (ADVIL) 800 MG tablet Take 1 tablet (800 mg total) by mouth every 8 (eight) hours as needed. Patient taking differently: Take 800 mg by mouth every 8 (eight) hours as needed for mild pain. 02/22/21   Gildardo Pounds, NP  insulin isophane & regular human KwikPen (HUMULIN 70/30 MIX) (70-30) 100 UNIT/ML KwikPen Inject 15 Units into the skin 2 (two) times daily with a meal. Take with two large meals daily, ideally breakfast and dinner or lunch and dinner.  08/24/22   Linus Galas, MD  Insulin Pen Needle (B-D UF III MINI PEN NEEDLES) 31G X 5 MM MISC Insert into the skin as prescribed 07/26/21   Argentina Donovan, PA-C  Insulin Pen Needle 32G X 4 MM MISC Use with Humulin 70/30 kwikpen 08/24/22     methocarbamol (ROBAXIN) 500 MG tablet Take 1 tablet (500 mg total) by mouth every 8 (eight) hours as needed for muscle spasms. Patient not taking: Reported on 08/23/2022 02/22/21   Gildardo Pounds, NP  pantoprazole (PROTONIX) 40 MG tablet Take 1 tablet (40 mg total) by mouth daily. Patient  not taking: Reported on 08/23/2022 02/01/22   Hayden Rasmussen, MD  simvastatin (ZOCOR) 20 MG tablet TAKE ONE TABLET BY MOUTH at bedtime Patient taking differently: Take 20 mg by mouth every evening. 10/11/21   Charlott Rakes, MD  triamcinolone cream (KENALOG) 0.1 % Apply 1 Application topically 2 (two) times daily. Do not use for longer than 2 weeks in a row in any 1 location Patient not taking: Reported on 08/23/2022 06/22/22   Prosperi, Christian H, PA-C  TRUEplus Lancets 28G MISC use as directed 3 (three) times daily. 07/26/21   Argentina Donovan, PA-C  insulin lispro (HUMALOG KWIKPEN) 100 UNIT/ML KwikPen Monitor blood glucose TID. For blood sugars 0-150 give 0 units of insulin, 151-200 give 2 units of insulin, 201-250 give 4 units, 251-300 give 6 units, 301-350 give 8 units, 351-400 give 10 units,> 400 give 12 units and call M.D. Discussed hypoglycemia protocol. 03/22/20 04/08/20  Gildardo Pounds, NP    Family History Family History  Problem Relation Age of Onset   Hypertension Mother    Diabetes Paternal Uncle     Social History Social History   Tobacco Use   Smoking status: Never   Smokeless tobacco: Never  Vaping Use   Vaping Use: Never used  Substance Use Topics   Alcohol use: Not Currently    Alcohol/week: 1.0 standard drink of alcohol    Types: 1 Glasses of wine per week    Comment: rarely   Drug use: No     Allergies   Geodon [ziprasidone hcl], Ziprasidone mesylate, and Amlodipine   Review of Systems Review of Systems  HENT: Negative.    Respiratory: Negative.    Cardiovascular: Negative.   Gastrointestinal:  Positive for diarrhea, nausea and vomiting. Negative for abdominal pain.  Genitourinary: Negative.   Skin: Negative.   Neurological: Negative.      Physical Exam Triage Vital Signs ED Triage Vitals  Enc Vitals Group     BP 09/17/22 1344 (!) 161/81     Pulse Rate 09/17/22 1344 82     Resp 09/17/22 1344 18     Temp 09/17/22 1344 98.1 F (36.7 C)      Temp Source 09/17/22 1344 Oral     SpO2 09/17/22 1344 98 %     Weight --      Height --      Head Circumference --      Peak Flow --      Pain Score 09/17/22 1345 0     Pain Loc --      Pain Edu? --      Excl. in [place]? --    No data found.  Updated Vital Signs BP (!) 161/81 (BP Location: Left Arm)   Pulse 82   Temp 98.1 F (36.7 C) (Oral)   Resp 18   SpO2 98%   Visual Acuity Right Eye Distance:  Left Eye Distance:   Bilateral Distance:    Right Eye Near:   Left Eye Near:    Bilateral Near:     Physical Exam Vitals and nursing note reviewed.  Constitutional:      General: He is not in acute distress.    Appearance: He is not ill-appearing.  HENT:     Right Ear: Tympanic membrane normal.     Left Ear: Tympanic membrane normal.  Cardiovascular:     Rate and Rhythm: Normal rate and regular rhythm.     Pulses: Normal pulses.     Heart sounds: Normal heart sounds.  Pulmonary:     Effort: Pulmonary effort is normal.     Breath sounds: Normal breath sounds.  Abdominal:     General: Bowel sounds are normal.     Palpations: Abdomen is soft.  Neurological:     Mental Status: He is alert.      UC Treatments / Results  Labs (all labs ordered are listed, but only abnormal results are displayed) Labs Reviewed  COMPREHENSIVE METABOLIC PANEL    EKG   Radiology No results found.  Procedures Procedures (including critical care time)  Medications Ordered in UC Medications - No data to display  Initial Impression / Assessment and Plan / UC Course  I have reviewed the triage vital signs and the nursing notes.  Pertinent labs & imaging results that were available during my care of the patient were reviewed by me and considered in my medical decision making (see chart for details).     1.  Food poisoning: Increase oral fluid intake Zofran ODT as needed for nausea/vomiting If symptoms worsens patient is advised to return to urgent care to be reevaluated  2.   Lower extremity swelling: This is likely secondary to chronic kidney disease Complete metabolic panel Return precautions given. Final Clinical Impressions(s) / UC Diagnoses   Final diagnoses:  Food poisoning  Pedal edema     Discharge Instructions      Please wear compression stockings to help with leg swelling Please decrease salt intake to less than 3 g a day Will call you with recommendations if labs are abnormal Please continue to stay well-hydrated Avoid ibuprofen and other nonsteroidal anti-inflammatory drugs Return to urgent care if you have any other concerns.   ED Prescriptions     Medication Sig Dispense Auth. Provider   ondansetron (ZOFRAN-ODT) 4 MG disintegrating tablet Take 1 tablet (4 mg total) by mouth every 8 (eight) hours as needed for nausea or vomiting. 10 tablet Jermond Burkemper, Myrene Galas, MD      PDMP not reviewed this encounter.   Chase Picket, MD 09/17/22 [phone]

## 2022-09-18 ENCOUNTER — Ambulatory Visit: Payer: 59 | Admitting: Internal Medicine

## 2022-09-19 ENCOUNTER — Telehealth: Payer: 59 | Admitting: Physician Assistant

## 2022-09-19 DIAGNOSIS — E860 Dehydration: Secondary | ICD-10-CM

## 2022-09-19 DIAGNOSIS — R600 Localized edema: Secondary | ICD-10-CM | POA: Diagnosis not present

## 2022-09-19 DIAGNOSIS — E1022 Type 1 diabetes mellitus with diabetic chronic kidney disease: Secondary | ICD-10-CM

## 2022-09-19 DIAGNOSIS — N183 Chronic kidney disease, stage 3 unspecified: Secondary | ICD-10-CM

## 2022-09-19 DIAGNOSIS — I129 Hypertensive chronic kidney disease with stage 1 through stage 4 chronic kidney disease, or unspecified chronic kidney disease: Secondary | ICD-10-CM

## 2022-09-19 NOTE — Patient Instructions (Signed)
[name], thank you for joining Mar Daring, PA-C for today's virtual visit.  While this provider is not your primary care provider (PCP), if your PCP is located in our provider database this encounter information will be shared with them immediately following your visit.   [facility] account gives you access to today's visit and all your visits, tests, and labs performed at [facility] " click here if you don't have a [facility] account or go to [email or web address]  Consent: (Patient) [name] provided verbal consent for this virtual visit at the beginning of the encounter.  Current Medications:  Current Outpatient Medications:    Blood Pressure Monitoring ([employer] ADVANCED BP MONITOR) DEVI, 1 Device by Does not apply route daily., Disp: 1 each, Rfl: 0   Continuous Blood Gluc Receiver (FREESTYLE LIBRE 2 READER) DEVI, Use to check blood sugar three times daily. E10.8, Disp: 1 each, Rfl: 0   Continuous Blood Gluc Sensor (FREESTYLE LIBRE 2 SENSOR) MISC, Use to check blood sugar three times daily. Change sensor every 2 weeks. E10.8, Disp: 2 each, Rfl: 3   FLUoxetine (PROZAC) 10 MG capsule, Take 1 capsule (10 mg total) by mouth daily., Disp: 30 capsule, Rfl: 2   gabapentin (NEURONTIN) 300 MG capsule, Take 1 capsule (300 mg total) by mouth daily., Disp: 30 capsule, Rfl: 0   glucose blood test strip, Use as instructed. MUST MAKE APPT FOR FURTHER REFILLS, Disp: 100 each, Rfl: 0   ibuprofen (ADVIL) 800 MG tablet, Take 1 tablet (800 mg total) by mouth every 8 (eight) hours as needed. (Patient taking differently: Take 800 mg by mouth every 8 (eight) hours as needed for mild pain.), Disp: 60 tablet, Rfl: 1   insulin isophane & regular human KwikPen (HUMULIN 70/30 MIX) (70-30) 100 UNIT/ML KwikPen, Inject 15 Units into the skin 2 (two) times daily with a meal. Take with two large meals daily, ideally breakfast and dinner or lunch and dinner., Disp: 15 mL,  Rfl: 1   Insulin Pen Needle (B-D UF III MINI PEN NEEDLES) 31G X 5 MM MISC, Insert into the skin as prescribed, Disp: 90 each, Rfl: 1   Insulin Pen Needle 32G X 4 MM MISC, Use with Humulin 70/30 kwikpen, Disp: 100 each, Rfl: 0   methocarbamol (ROBAXIN) 500 MG tablet, Take 1 tablet (500 mg total) by mouth every 8 (eight) hours as needed for muscle spasms. (Patient not taking: Reported on 08/23/2022), Disp: 60 tablet, Rfl: 2   ondansetron (ZOFRAN-ODT) 4 MG disintegrating tablet, Take 1 tablet (4 mg total) by mouth every 8 (eight) hours as needed for nausea or vomiting., Disp: 10 tablet, Rfl: 0   pantoprazole (PROTONIX) 40 MG tablet, Take 1 tablet (40 mg total) by mouth daily. (Patient not taking: Reported on 08/23/2022), Disp: 30 tablet, Rfl: 0   simvastatin (ZOCOR) 20 MG tablet, TAKE ONE TABLET BY MOUTH at bedtime (Patient taking differently: Take 20 mg by mouth every evening.), Disp: 90 tablet, Rfl: 1   triamcinolone cream (KENALOG) 0.1 %, Apply 1 Application topically 2 (two) times daily. Do not use for longer than 2 weeks in a row in any 1 location (Patient not taking: Reported on 08/23/2022), Disp: 80 g, Rfl: 0   TRUEplus Lancets 28G MISC, use as directed 3 (three) times daily., Disp: 100 each, Rfl: 11   Medications ordered in this encounter:  No orders of the defined types were placed in this encounter.    *If you need refills on  other medications prior to your next appointment, please contact your pharmacy*  Follow-Up: Call back or seek an in-person evaluation if the symptoms worsen or if the condition fails to improve as anticipated.  [facility] [phone]  Other Instructions  Dehydration, Adult Dehydration is a condition in which there is not enough water or other fluids in the body. This happens when a person loses more fluids than they take in. Important organs, such as the kidneys, brain, and heart, cannot function without a proper amount of fluids. Any loss of fluids  from the body can lead to dehydration. Dehydration can be mild, moderate, or severe. It should be treated right away to prevent it from becoming severe. What are the causes? Dehydration may be caused by: Health conditions, such as diarrhea, vomiting, fever, infection, or sweating or urinating a lot. Not drinking enough fluids. Certain medicines, such as medicines that remove excess fluid from the body (diuretics). Lack of safe drinking water. Not being able to get enough water and food. What increases the risk? The following factors may make you more likely to develop this condition: Having a long-term (chronic) illness that has not been treated properly, such as diabetes, heart disease, or kidney disease. Being 56 years of age or older. Having a disability. Living in a place that is high in altitude, where thinner, drier air causes more fluid loss. Doing exercises that put stress on your body for a long time (endurance sports). Being active in a hot climate. What are the signs or symptoms? Symptoms of dehydration depend on how severe it is. Mild or moderate dehydration Thirst. Dry lips or dry mouth. Dizziness or light-headedness. Muscle cramps. Dark urine. Urine may be the color of tea. Less urine or tears produced than usual. Headache. Severe dehydration Changes in skin. Your skin may be cold and clammy, blotchy, or pale. Your skin also may not return to normal after being lightly pinched and released. Little or no tears, urine, or sweat. Rapid breathing and low blood pressure. Your pulse may be weak or may be faster than 100 beats per minute when you are sitting still. Other changes, such as: Feeling very thirsty. Sunken eyes. Cold hands and feet. Confusion. Being very tired (lethargic) or having trouble waking from sleep. Short-term weight loss. Loss of consciousness. How is this diagnosed? This condition is diagnosed based on your symptoms and a physical exam. You may  have blood and urine tests to help confirm the diagnosis. How is this treated? Treatment for this condition depends on how severe it is. Treatment should be started right away. Do not wait until dehydration becomes severe. Severe dehydration is an emergency and needs to be treated in a hospital. Mild or moderate dehydration can be treated at home. You may be asked to: Drink more fluids. Drink an oral rehydration solution (ORS). This drink restores fluids, salts, and minerals in the blood (electrolytes). Stop any activities that caused dehydration, such as exercise. Cool off with cool compresses, cool mist, or cool fluids, if heat or too much sweat caused your condition. Take medicine to treat fever, if fever caused your condition. Take medicine to treat nausea and diarrhea, if vomiting or diarrhea caused your condition. Severe dehydration can be treated: With IV fluids. By correcting abnormal levels of electrolytes in your body. By treating the underlying cause of dehydration. Follow these instructions at home: Oral rehydration solution If told by your health care provider, drink an ORS: Make an ORS by following instructions  on the package. Start by drinking small amounts, about  cup (120 mL) every 5-10 minutes. Slowly increase how much you drink until you have taken the amount recommended by your health care provider.  Eating and drinking  Drink enough clear fluid to keep your urine pale yellow. If you were told to drink an ORS, finish the ORS first and then start slowly drinking other clear fluids. Drink fluids such as: Water. Do not drink only water. Doing that can lead to hyponatremia, which is having too little salt (sodium) in the body. Water from ice chips you suck on. Diluted fruit juice. This is fruit juice that you have added water to. Low-calorie sports drinks. Eat foods that contain a healthy balance of electrolytes, such as bananas, oranges, potatoes, tomatoes, and  spinach. Do not drink alcohol. Avoid the following: Drinks that contain a lot of sugar. These include high-calorie sports drinks, fruit juice that is not diluted, and soda. Caffeine. Foods that are greasy or contain a lot of fat or sugar. General instructions Take over-the-counter and prescription medicines only as told by your health care provider. Do not take sodium tablets. Doing that can lead to having too much sodium in the body (hypernatremia). Return to your normal activities as told by your health care provider. Ask your health care provider what activities are safe for you. Keep all follow-up visits. Your health care provider may need to check your progress and suggest new ways to treat your condition. Contact a health care provider if: You have muscle cramps, pain, or discomfort, such as: Pain in your abdomen and the pain gets worse or stays in one area. Stiff neck. You have a rash. You are more irritable than usual. You are sleepier or have a harder time waking. You feel weak or dizzy. You feel very thirsty. Get help right away if: You have symptoms of severe dehydration. You vomit every time you eat or drink. Your vomiting gets worse, does not go away, or includes blood or green matter (bile). You are getting treatment but symptoms are getting worse. You have a fever. You have a severe headache. You have: Diarrhea that gets worse or does not go away. Blood in your stool. This may cause stool to look black and tarry. Not urinating, or urinating only a small amount of very dark urine, within 6-8 hours. You have trouble breathing. These symptoms may be an emergency. Get help right away. Do not wait to see if the symptoms will go away. Do not drive yourself to the hospital. Call 911. This information is not intended to replace advice given to you by your health care provider. Make sure you discuss any questions you have with your health care provider. Document Revised:  01/22/2022 Document Reviewed: 01/22/2022 Elsevier Patient Education  [place].    If you have been instructed to have an in-person evaluation today at a local Urgent Care facility, please use the link below. It will take you to a list of all of our available [facility], including address, phone number and hours of operation. Please do not delay care.  [facility]  If you or a family member do not have a primary care provider, use the link below to schedule a visit and establish care. When you choose a [facility] primary care physician or advanced practice provider, you gain a long-term partner in health. Find a Primary Care Provider  Learn more about [facility]'s in-office and virtual care  options: [facility]

## 2022-09-19 NOTE — Progress Notes (Signed)
Virtual Visit Consent   [name], you are scheduled for a virtual visit with a [facility] provider today. Just as with appointments in the office, your consent must be obtained to participate. Your consent will be active for this visit and any virtual visit you may have with one of our providers in the next 365 days. If you have a MyChart account, a copy of this consent can be sent to you electronically.  As this is a virtual visit, video technology does not allow for your provider to perform a traditional examination. This may limit your provider's ability to fully assess your condition. If your provider identifies any concerns that need to be evaluated in person or the need to arrange testing (such as labs, EKG, etc.), we will make arrangements to do so. Although advances in technology are sophisticated, we cannot ensure that it will always work on either your end or our end. If the connection with a video visit is poor, the visit may have to be switched to a telephone visit. With either a video or telephone visit, we are not always able to ensure that we have a secure connection.  By engaging in this virtual visit, you consent to the provision of healthcare and authorize for your insurance to be billed (if applicable) for the services provided during this visit. Depending on your insurance coverage, you may receive a charge related to this service.  I need to obtain your verbal consent now. Are you willing to proceed with your visit today? [name] has provided verbal consent on 09/19/2022 for a virtual visit (video or telephone). Donald Daring, PA-C  Date: 09/19/2022 10:03 AM  Virtual Visit via Video Note   I, [name], connected with  [name]  ([id number], [birth year]) on 09/19/22 at  9:45 AM EDT by a video-enabled telemedicine application and verified that I am speaking with the correct person using two identifiers.  Location: Patient: Virtual Visit Location  Patient: Mobile Provider: Virtual Visit Location Provider: Home Office   I discussed the limitations of evaluation and management by telemedicine and the availability of in person appointments. The patient expressed understanding and agreed to proceed.    History of Present Illness: [name] is a 47 y.o. who identifies as a male who was assigned male at birth, and is being seen today for swelling. Continues to have pedal edema bilaterally that is now up above ankles per patient. Also reports having hand swelling to the wrist. Reports he feels dehydrated. He was diagnosed with food poisoning on 09/19/22 at [facility]. Was going to have a CMP ordered, and he reports it was drawn (but took a while and had complications getting it), however, there are no orders or results in the chart. He was also hospitalized in February with DKA. Last Cr was 2.07 on 08/24/22, has not been checked since. He does have an appt with a new PCP coming up on 09/26/22.   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 ([place]) 01/23/2017   Knee pain 03/16/2016   Scrotal pain 01/13/2016   Seizure disorder ([place]) 03/10/2015   Type 1 diabetes mellitus with hyperlipidemia ([place]) 03/10/2015   DIABETIC PERIPHERAL NEUROPATHY 07/10/2010   UNSPECIFIED VITAMIN D DEFICIENCY 06/16/2009   PTSD (post-traumatic stress  disorder) 03/26/2008   Essential hypertension, benign 03/24/2008   SLEEP DISORDER 03/24/2008   SLEEP DEPRIVATION 02/09/2008   DM (diabetes mellitus), type 1 with complications ([place]) [id number]    Allergies:  Allergies  Allergen Reactions   Geodon [Ziprasidone Hcl] Other (See Comments)    Severe muscle spasms (ENTIRE BODY)- had to call EMS   Ziprasidone Mesylate Other (See Comments)    "severe muscle spasms"- Had to  call EMS   Amlodipine Swelling   Medications:  Current Outpatient Medications:    Blood Pressure Monitoring ([employer] ADVANCED BP MONITOR) DEVI, 1 Device by Does not apply route daily., Disp: 1 each, Rfl: 0   Continuous Blood Gluc Receiver (FREESTYLE LIBRE 2 READER) DEVI, Use to check blood sugar three times daily. E10.8, Disp: 1 each, Rfl: 0   Continuous Blood Gluc Sensor (FREESTYLE LIBRE 2 SENSOR) MISC, Use to check blood sugar three times daily. Change sensor every 2 weeks. E10.8, Disp: 2 each, Rfl: 3   FLUoxetine (PROZAC) 10 MG capsule, Take 1 capsule (10 mg total) by mouth daily., Disp: 30 capsule, Rfl: 2   gabapentin (NEURONTIN) 300 MG capsule, Take 1 capsule (300 mg total) by mouth daily., Disp: 30 capsule, Rfl: 0   glucose blood test strip, Use as instructed. MUST MAKE APPT FOR FURTHER REFILLS, Disp: 100 each, Rfl: 0   ibuprofen (ADVIL) 800 MG tablet, Take 1 tablet (800 mg total) by mouth every 8 (eight) hours as needed. (Patient taking differently: Take 800 mg by mouth every 8 (eight) hours as needed for mild pain.), Disp: 60 tablet, Rfl: 1   insulin isophane & regular human KwikPen (HUMULIN 70/30 MIX) (70-30) 100 UNIT/ML KwikPen, Inject 15 Units into the skin 2 (two) times daily with a meal. Take with two large meals daily, ideally breakfast and dinner or lunch and dinner., Disp: 15 mL, Rfl: 1   Insulin Pen Needle (B-D UF III MINI PEN NEEDLES) 31G X 5 MM MISC, Insert into the skin as prescribed, Disp: 90 each, Rfl: 1   Insulin Pen Needle 32G X 4 MM MISC, Use with Humulin 70/30 kwikpen, Disp: 100 each, Rfl: 0   methocarbamol (ROBAXIN) 500 MG tablet, Take 1 tablet (500 mg total) by mouth every 8 (eight) hours as needed for muscle spasms. (Patient not taking: Reported on 08/23/2022), Disp: 60 tablet, Rfl: 2   ondansetron (ZOFRAN-ODT) 4 MG disintegrating tablet, Take 1 tablet (4 mg total) by mouth every 8 (eight) hours as needed for nausea or vomiting., Disp: 10 tablet, Rfl: 0   pantoprazole  (PROTONIX) 40 MG tablet, Take 1 tablet (40 mg total) by mouth daily. (Patient not taking: Reported on 08/23/2022), Disp: 30 tablet, Rfl: 0   simvastatin (ZOCOR) 20 MG tablet, TAKE ONE TABLET BY MOUTH at bedtime (Patient taking differently: Take 20 mg by mouth every evening.), Disp: 90 tablet, Rfl: 1   triamcinolone cream (KENALOG) 0.1 %, Apply 1 Application topically 2 (two) times daily. Do not use for longer than 2 weeks in a row in any 1 location (Patient not taking: Reported on 08/23/2022), Disp: 80 g, Rfl: 0   TRUEplus Lancets 28G MISC, use as directed 3 (three) times daily., Disp: 100 each, Rfl: 11  Observations/Objective: Patient is well-developed, well-nourished in no acute distress.  Resting comfortably  Head is normocephalic, atraumatic.  No labored breathing.  Speech is clear and coherent with logical content.  Patient is alert and oriented at baseline.    Assessment and Plan: 1. Dehydration  2. Localized edema  3.  Type 1 DM with CKD stage 3 and hypertension (HCC)  - High risk for re-hospitalization - Advised to seek in person evaluation at local UC or ER for consideration of labs and IV fluids   Follow Up Instructions: I discussed the assessment and treatment plan with the patient. The patient was provided an opportunity to ask questions and all were answered. The patient agreed with the plan and demonstrated an understanding of the instructions.  A copy of instructions were sent to the patient via MyChart unless otherwise noted below.    The patient was advised to call back or seek an in-person evaluation if the symptoms worsen or if the condition fails to improve as anticipated.  Time:  I spent 15 minutes with the patient via telehealth technology discussing the above problems/concerns.    Donald Daring, PA-C

## 2022-09-20 ENCOUNTER — Ambulatory Visit: Payer: Self-pay

## 2022-09-20 NOTE — Telephone Encounter (Signed)
Second attempt to reach pt. Left message to call back about symptoms. 

## 2022-09-20 NOTE — Telephone Encounter (Signed)
  Chief Complaint: dizziness Symptoms: dizziness like motion sickness, feet swelling that is going up leg now Frequency: 2-3 days  Pertinent Negatives: NA Disposition: [x] ED /[] Urgent Care (no appt availability in office) / [] Appointment(In office/virtual)/ []  [facility]/ [] Home Care/ [] Refused Recommended Disposition /[] [facility]/ []  Follow-up with PCP Additional Notes: pt went to UC on 3/11 for food poisoning, was told he was dehydrated d/t hard time getting blood. Pt was given Zofran that helped with nausea but dizziness isn't any better. No appts available and recommended pt ED if sx dont get better by tonight. Pt states at times he feels like he an insect go across his foot but nothing is there. Advised pt he can try Dramamine OTC for dizziness but really recommended ED if possible for him to go. Pt verbalized understanding.

## 2022-09-20 NOTE — Telephone Encounter (Signed)
ummary: Dizziness, unbalanced   The patient called in stating he recently visited the urgent care for swollen ankles and feet. They said he did have edema and food poisoning at the time. He has a hospital follow up on Wednesday the 20th with the provider but he says he is not used to feeling sick. He says he is off balance and feels like he is on a boat. He did get medicine for nausea which helped with that but not the dizziness. Please assist patient further      Left message to call back about symptoms.

## 2022-09-20 NOTE — Telephone Encounter (Signed)
Reason for Disposition . [1] MODERATE dizziness (e.g., interferes with normal activities) AND [2] has NOT been evaluated by doctor (or NP/PA) for this  (Exception: Dizziness caused by heat exposure, sudden standing, or poor fluid intake.)  Answer Assessment - Initial Assessment Questions 2. LIGHTHEADED: "Do you feel lightheaded?" (e.g., somewhat faint, woozy, weak upon standing)     Feels off balance and dizziness like motion sickness like on a cruise  3. VERTIGO: "Do you feel like either you or the room is spinning or tilting?" (i.e. vertigo)     Kind of  4. SEVERITY: "How bad is it?"  "Do you feel like you are going to faint?" "Can you stand and walk?"   - MILD: Feels slightly dizzy, but walking normally.   - MODERATE: Feels unsteady when walking, but not falling; interferes with normal activities (e.g., school, work).   - SEVERE: Unable to walk without falling, or requires assistance to walk without falling; feels like passing out now.      Moderate  5. ONSET:  "When did the dizziness begin?"     2-3 days  6. AGGRAVATING FACTORS: "Does anything make it worse?" (e.g., standing, change in head position)     Up and moving around  10. OTHER SYMPTOMS: "Do you have any other symptoms?" (e.g., fever, chest pain, vomiting, diarrhea, bleeding)       Feet swelling  Protocols used: Dizziness - Lightheadedness-A-AH

## 2022-09-20 NOTE — Telephone Encounter (Addendum)
3rd attempt to return his call without success.   Left a voicemail to call back.    Looking in his chart he has had multiple cancellations by pt. And No Shows to his appts going back to 11/24/2021 when he saw Geryl Rankins, NP at [facility].  Noted he has an appt. With Juluis Mire, NP for 09/26/2022.  Forwarded his message to [facility] since that was the last place he saw a provider.

## 2022-09-26 ENCOUNTER — Other Ambulatory Visit: Payer: Self-pay

## 2022-09-26 ENCOUNTER — Encounter (INDEPENDENT_AMBULATORY_CARE_PROVIDER_SITE_OTHER): Payer: Self-pay | Admitting: Primary Care

## 2022-09-26 ENCOUNTER — Ambulatory Visit (INDEPENDENT_AMBULATORY_CARE_PROVIDER_SITE_OTHER): Payer: 59 | Admitting: Primary Care

## 2022-09-26 VITALS — BP 188/94 | HR 70 | Resp 16 | Ht 70.0 in | Wt 170.0 lb

## 2022-09-26 DIAGNOSIS — F4323 Adjustment disorder with mixed anxiety and depressed mood: Secondary | ICD-10-CM

## 2022-09-26 DIAGNOSIS — Z7689 Persons encountering health services in other specified circumstances: Secondary | ICD-10-CM

## 2022-09-26 DIAGNOSIS — E1022 Type 1 diabetes mellitus with diabetic chronic kidney disease: Secondary | ICD-10-CM

## 2022-09-26 DIAGNOSIS — I129 Hypertensive chronic kidney disease with stage 1 through stage 4 chronic kidney disease, or unspecified chronic kidney disease: Secondary | ICD-10-CM

## 2022-09-26 DIAGNOSIS — Z09 Encounter for follow-up examination after completed treatment for conditions other than malignant neoplasm: Secondary | ICD-10-CM

## 2022-09-26 DIAGNOSIS — R600 Localized edema: Secondary | ICD-10-CM

## 2022-09-26 DIAGNOSIS — N183 Chronic kidney disease, stage 3 unspecified: Secondary | ICD-10-CM

## 2022-09-26 LAB — GLUCOSE, POCT (MANUAL RESULT ENTRY): POC Glucose: 262 mg/dl — AB (ref 70–99)

## 2022-09-26 MED ORDER — BASAGLAR KWIKPEN 100 UNIT/ML ~~LOC~~ SOPN
20.0000 [IU] | PEN_INJECTOR | Freq: Two times a day (BID) | SUBCUTANEOUS | 3 refills | Status: DC
  Filled 2022-09-26 – 2022-09-27 (×2): qty 15, 38d supply, fill #0
  Filled 2022-10-31: qty 15, 38d supply, fill #1

## 2022-09-26 MED ORDER — LISINOPRIL-HYDROCHLOROTHIAZIDE 20-25 MG PO TABS
1.0000 | ORAL_TABLET | Freq: Every day | ORAL | 1 refills | Status: DC
  Filled 2022-09-26 – 2022-09-27 (×2): qty 90, 90d supply, fill #0

## 2022-09-26 MED ORDER — INSULIN LISPRO 100 UNIT/ML IJ SOLN
8.0000 [IU] | Freq: Three times a day (TID) | INTRAMUSCULAR | 3 refills | Status: DC
  Filled 2022-09-26 – 2022-09-27 (×2): qty 10, 28d supply, fill #0
  Filled 2022-10-31: qty 10, 28d supply, fill #1

## 2022-09-26 NOTE — Progress Notes (Signed)
[facility]   Subjective:   [name] is a 47 y.o. male presents for hospital follow up and establish care.  Patient presented to the urgent care on 09/17/22, with symptoms of history of vomiting and diarrhea.  He also stated he had 4 episodes of nonbloody nonbilious vomiting.  This has been resolved.  Today he presents with uncontrolled type 1 diabetes he endorses polyphasia, polydipsia, polyuria no vision changes.  Blood pressure is elevated he is on no current blood pressure medication. Patient has No headache, No chest pain, No abdominal pain - No Nausea, No new weakness tingling or numbness, No Cough - shortness of breath.  Patient has a history of noncompliance. Past Medical History:  Diagnosis Date   Diabetes mellitus    Hypertension    Seizures (HCC)      Allergies  Allergen Reactions   Geodon [Ziprasidone Hcl] Other (See Comments)    Severe muscle spasms (ENTIRE BODY)- had to call EMS   Ziprasidone Mesylate Other (See Comments)    "severe muscle spasms"- Had to call EMS   Amlodipine Swelling      Current Outpatient Medications on File Prior to Visit  Medication Sig Dispense Refill   Blood Pressure Monitoring ([employer] ADVANCED BP MONITOR) DEVI 1 Device by Does not apply route daily. 1 each 0   Continuous Blood Gluc Receiver (FREESTYLE LIBRE 2 READER) DEVI Use to check blood sugar three times daily. E10.8 1 each 0   Continuous Blood Gluc Sensor (FREESTYLE LIBRE 2 SENSOR) MISC Use to check blood sugar three times daily. Change sensor every 2 weeks. E10.8 2 each 3   FLUoxetine (PROZAC) 10 MG capsule Take 1 capsule (10 mg total) by mouth daily. 30 capsule 2   gabapentin (NEURONTIN) 300 MG capsule Take 1 capsule (300 mg total) by mouth daily. 30 capsule 0   glucose blood test strip Use as instructed. MUST MAKE APPT FOR FURTHER REFILLS 100 each 0   ibuprofen (ADVIL) 800 MG tablet Take 1 tablet (800 mg total) by mouth every 8 (eight) hours as needed. (Patient taking  differently: Take 800 mg by mouth every 8 (eight) hours as needed for mild pain.) 60 tablet 1   insulin isophane & regular human KwikPen (HUMULIN 70/30 MIX) (70-30) 100 UNIT/ML KwikPen Inject 15 Units into the skin 2 (two) times daily with a meal. Take with two large meals daily, ideally breakfast and dinner or lunch and dinner. 15 mL 1   Insulin Pen Needle (B-D UF III MINI PEN NEEDLES) 31G X 5 MM MISC Insert into the skin as prescribed 90 each 1   Insulin Pen Needle 32G X 4 MM MISC Use with Humulin 70/30 kwikpen 100 each 0   methocarbamol (ROBAXIN) 500 MG tablet Take 1 tablet (500 mg total) by mouth every 8 (eight) hours as needed for muscle spasms. (Patient not taking: Reported on 08/23/2022) 60 tablet 2   ondansetron (ZOFRAN-ODT) 4 MG disintegrating tablet Take 1 tablet (4 mg total) by mouth every 8 (eight) hours as needed for nausea or vomiting. 10 tablet 0   pantoprazole (PROTONIX) 40 MG tablet Take 1 tablet (40 mg total) by mouth daily. (Patient not taking: Reported on 08/23/2022) 30 tablet 0   simvastatin (ZOCOR) 20 MG tablet TAKE ONE TABLET BY MOUTH at bedtime (Patient taking differently: Take 20 mg by mouth every evening.) 90 tablet 1   triamcinolone cream (KENALOG) 0.1 % Apply 1 Application topically 2 (two) times daily. Do not use for longer than  2 weeks in a row in any 1 location (Patient not taking: Reported on 08/23/2022) 80 g 0   TRUEplus Lancets 28G MISC use as directed 3 (three) times daily. 100 each 11   [DISCONTINUED] insulin lispro (HUMALOG KWIKPEN) 100 UNIT/ML KwikPen Monitor blood glucose TID. For blood sugars 0-150 give 0 units of insulin, 151-200 give 2 units of insulin, 201-250 give 4 units, 251-300 give 6 units, 301-350 give 8 units, 351-400 give 10 units,> 400 give 12 units and call M.D. Discussed hypoglycemia protocol. 15 mL 6   No current facility-administered medications on file prior to visit.     Review of System: Comprehensive ROS Pertinent positive and negative noted  in HPI    Objective:  Blood Pressure (Abnormal) 188/94 (BP Location: Right Arm, Patient Position: Sitting, Cuff Size: Normal)   Pulse 70   Respiration 16   Height 5\' 10"  (1.778 m)   Weight 170 lb (77.1 kg)   Oxygen Saturation 98%   Body Mass Index 24.39 kg/m   Filed Weights   09/26/22 1418  Weight: 170 lb (77.1 kg)    Physical Exam: General Appearance: Well nourished, in no apparent distress. Eyes: PERRLA, EOMs, conjunctiva no swelling or erythema Sinuses: No Frontal/maxillary tenderness ENT/Mouth: Ext aud canals clear, TMs without erythema, bulging. No erythema, swelling, or exudate on post pharynx.  Tonsils not swollen or erythematous. Hearing normal.  Neck: Supple, thyroid normal.  Respiratory: Respiratory effort normal, BS equal bilaterally without rales, rhonchi, wheezing or stridor.  Cardio: RRR with no MRGs.  Bilateral 2+ pedal edema Abdomen: Soft, + BS.  Non tender, no guarding, rebound, hernias, masses. Lymphatics: Non tender without lymphadenopathy.  Musculoskeletal: Full ROM, 5/5 strength, normal gait.  Skin: Warm, dry without rashes, lesions, ecchymosis.  Neuro: Cranial nerves intact. Normal muscle tone, no cerebellar symptoms. Sensation intact.  Psych: Awake and oriented X 3, normal affect, Insight and Judgment appropriate.    Assessment:  .[name] was seen today for hospitalization follow-up.  Diagnoses and all orders for this visit:  Type 1 DM with CKD stage 3 and hypertension (HCC) -     POCT glucose (manual entry) -     Microalbumin / creatinine urine ratio -     Insulin Glargine (BASAGLAR KWIKPEN) 100 UNIT/ML; Inject 20 Units into the skin 2 (two) times daily. -     insulin lispro (HUMALOG) 100 UNIT/ML injection; Inject 0.08 mLs (8 Units total) into the skin 3 (three) times daily with meals. Added lisinopril/HCTZ will monitor kidney function  Encounter to establish care  Adjustment disorder with mixed anxiety and depressed mood Refer to clinical social  worker  Hospital discharge follow-up Discharge diagnoses Food poisoning & Pedal edema  Other orders -     lisinopril-hydrochlorothiazide (ZESTORETIC) 20-25 MG tablet; Take 1 tablet by mouth daily.  Pedal edema  Would benefit with a diuretic monitor closely and advised for compression hoses will follow-up for BP check in 2 to 3 weeks and clinical pharmacist within 6 weeks This note has been created with Surveyor, quantity. Any transcriptional errors are unintentional.   Kerin Perna, NP 09/26/2022, 2:41 PM

## 2022-09-27 ENCOUNTER — Other Ambulatory Visit: Payer: Self-pay

## 2022-09-27 LAB — MICROALBUMIN / CREATININE URINE RATIO
Creatinine, Urine: 142.9 mg/dL
Microalb/Creat Ratio: 3866 mg/g creat — ABNORMAL HIGH (ref 0–29)
Microalbumin, Urine: 5525.1 ug/mL

## 2022-09-28 ENCOUNTER — Other Ambulatory Visit (INDEPENDENT_AMBULATORY_CARE_PROVIDER_SITE_OTHER): Payer: Self-pay | Admitting: Primary Care

## 2022-09-28 NOTE — Telephone Encounter (Signed)
Medication Refill - Medication: Pt stated his insulin lispro (HUMALOG) is a vial and he does not have the needles. Pt is requesting Rx be sent in for the needles.   Has the patient contacted their pharmacy? No. (Agent: If no, request that the patient contact the pharmacy for the refill. If patient does not wish to contact the pharmacy document the reason why and proceed with request.)   Preferred Pharmacy (with phone number or street name):  [facility] [place] [place] [address]  Phone: [phone] Fax: [phone]  Hours: Not open 24 hours   Has the patient been seen for an appointment in the last year OR does the patient have an upcoming appointment? Yes.    Agent: Please be advised that RX refills may take up to 3 business days. We ask that you follow-up with your pharmacy.

## 2022-10-01 MED ORDER — INSULIN PEN NEEDLE 32G X 4 MM MISC: 0 refills | Status: DC

## 2022-10-01 NOTE — Telephone Encounter (Signed)
Requested Prescriptions  Pending Prescriptions Disp Refills   Insulin Pen Needle 32G X 4 MM MISC 100 each 0    Sig: Use with Humulin 70/30 kwikpen     Endocrinology: Diabetes - Testing Supplies Passed - 09/28/2022  3:24 PM      Passed - Valid encounter within last 12 months    Recent Outpatient Visits           5 days ago Type 1 DM with CKD stage 3 and hypertension ([place])   [place] [facility] Kerin Perna, NP   10 months ago Essential hypertension, benign   [place], [place] W, NP   10 months ago DM (diabetes mellitus), type 1 with complications [facility])   [place], Vernia Buff, NP   11 months ago Essential hypertension   [place], RPH-CPP   12 months ago Tremor of both hands   [place], MD       Future Appointments             In 3 weeks Daisy Blossom, Jarome Matin, [place]   In 2 months [place], Melanie Crazier, MD [facility]   In 2 months Oletta Lamas, Milford Cage, NP [place]

## 2022-10-02 ENCOUNTER — Other Ambulatory Visit: Payer: Self-pay

## 2022-10-02 ENCOUNTER — Telehealth (INDEPENDENT_AMBULATORY_CARE_PROVIDER_SITE_OTHER): Payer: Self-pay

## 2022-10-02 ENCOUNTER — Telehealth (INDEPENDENT_AMBULATORY_CARE_PROVIDER_SITE_OTHER): Payer: Self-pay | Admitting: Licensed Clinical Social Worker

## 2022-10-02 ENCOUNTER — Telehealth: Payer: Self-pay | Admitting: Primary Care

## 2022-10-02 ENCOUNTER — Other Ambulatory Visit (INDEPENDENT_AMBULATORY_CARE_PROVIDER_SITE_OTHER): Payer: Self-pay

## 2022-10-02 ENCOUNTER — Telehealth (INDEPENDENT_AMBULATORY_CARE_PROVIDER_SITE_OTHER): Payer: Self-pay | Admitting: Primary Care

## 2022-10-02 MED ORDER — INSULIN SYRINGES (DISPOSABLE) U-100 0.3 ML MISC: 11 refills | Status: DC

## 2022-10-02 NOTE — Telephone Encounter (Signed)
Dee with [facility] called to let provider know that insurance is requiring the usage instructions on Insulin Syringes, Disposable, U-100 0.3 ML MISC  to be specific. Please advise.

## 2022-10-02 NOTE — Telephone Encounter (Unsigned)
Copied from [place] [phone]. Topic: General - Other >> Oct 02, 2022 12:30 PM Eve[place] C wrote: Reason for CRM: Medication Refill - Medication: insulin syringes   Has the patient contacted their pharmacy? Yes.   (Agent: If no, request that the patient contact the pharmacy for the refill. If patient does not wish to contact the pharmacy document the reason why and proceed with request.) (Agent: If yes, when and what did the pharmacy advise?)  Preferred Pharmacy (with phone number or street name): [facility] [place], [address] [place] [address] Phone: [phone] Fax: [phone] Hours: Not open 24 hours   Has the patient been seen for an appointment in the last year OR does the patient have an upcoming appointment? Yes.    Agent: Please be advised that RX refills may take up to 3 business days. We ask that you follow-up with your pharmacy.

## 2022-10-02 NOTE — Telephone Encounter (Signed)
Copied from [place] [id number]. Topic: General - Inquiry >> Oct 02, 2022 10:46 AM Penni Bombard wrote: Reason for CRM: pt needs a note for social services for assistance.  He was fired for work for being out sick.  CB@  [phone]

## 2022-10-02 NOTE — Telephone Encounter (Signed)
LCSWA called patient today to introduce herself and to assess patients' mental health needs. Patient was referred by PCP for Adjustment disorder with mixed anxiety and depressed mood and other health concerns.Pt stated that he recently lost his job due to his health. LCSWA connect pt with financial coordinator to assistant with Medicaid or other finances # [phone]

## 2022-10-02 NOTE — Telephone Encounter (Signed)
Script has been fixed and sent to pharmacy.

## 2022-10-02 NOTE — Telephone Encounter (Signed)
Pt recently loss job and does not have primary coverage (outside of [employer]) - may be eligible for GCCN/CAFA. Set appt for Fri 10/05/2022 at 9:30 AM at [facility] PCP. Unable to successfully book appt due to block in schedule; however, appt is set. Will reach back out to make pt aware.

## 2022-10-04 ENCOUNTER — Telehealth: Payer: Self-pay | Admitting: Primary Care

## 2022-10-04 ENCOUNTER — Other Ambulatory Visit: Payer: Self-pay | Admitting: Pharmacist

## 2022-10-04 ENCOUNTER — Ambulatory Visit (INDEPENDENT_AMBULATORY_CARE_PROVIDER_SITE_OTHER): Payer: Self-pay | Admitting: *Deleted

## 2022-10-04 MED ORDER — ACCU-CHEK GUIDE W/DEVICE KIT: PACK | 0 refills | Status: DC

## 2022-10-04 MED ORDER — ACCU-CHEK SOFTCLIX LANCETS MISC: 6 refills | Status: DC

## 2022-10-04 MED ORDER — ACCU-CHEK GUIDE VI STRP: ORAL_STRIP | 3 refills | Status: DC

## 2022-10-04 NOTE — Telephone Encounter (Signed)
Will forward to provider in regards to the note   Will forward to CPP in regards to blood sugars

## 2022-10-04 NOTE — Telephone Encounter (Signed)
Copied from [place] [id number]. Topic: General - Other >> Oct 04, 2022  8:58 AM Oley Balm A wrote: Reason for CRM: Pt states that he is needing help with something to check his blood sugar. Pt states that he cannot afford his Dexcom glucose monitor right now and he is out of test strips to check his blood sugar. Pt also states that ever since he has been taking insulin lispro (HUMALOG) 100 UNIT/ML injection  it does not matter what he eats his sugar is dropping in the middle of the night.  Pt states that he is hypoglycemic and this is a log of insulin for him to take.  Pt also states that he went to DSS yesterday to get help with getting food and they advised him that he is needing a note from his PCP stating that he is needing help with food for him to bring to them.  Please call pt back.

## 2022-10-04 NOTE — Telephone Encounter (Signed)
Summary: still havig pain in feet and hands   Pt called saying he is still having problems with his hands and feet swelling and hurting.  CB@ [phone]     Chief Complaint: Left wrist pain Symptoms: Left wrist pain after boxing. States has had for "A while" but didn't notice swelling. After boxing swollen, weak, "Cannot pull on things, hold things." 7/10 pain Frequency: Worsening after boxing 2 weeks ago, continues to increase in swelling Pertinent Negatives: Patient denies  Disposition: [] ED /[x] Urgent Care (no appt availability in office) / [] Appointment(In office/virtual)/ []  [place] [facility]/ [] Home Care/ [] Refused Recommended Disposition /[] [place] [facility]/ []  Follow-up with PCP Additional Notes: No availability, advised [facility]. Pt states will follow disposition. Care advise provided, verbalizes understanding.  Reason for Disposition  Weakness (i.e., loss of strength) of new-onset in hand or fingers  (Exceptions: not truly weak, hand feels weak because of pain; weakness present > 2 weeks)  Answer Assessment - Initial Assessment Questions 1. ONSET: "When did the pain start?"      2. LOCATION: "Where is the pain located?"     Left hand and wrist 3. PAIN: "How bad is the pain?" (Scale 1-10; or mild, moderate, severe)   - MILD (1-3): doesn't interfere with normal activities   - MODERATE (4-7): interferes with normal activities (e.g., work or school) or awakens from sleep   - SEVERE (8-10): excruciating pain, unable to use hand at all     7/10 4. WORK OR EXERCISE: "Has there been any recent work or exercise that involved this part (i.e., hand or wrist) of the body?"     At gym maybe 5. CAUSE: "What do you think is causing the pain?"     HAnd was swollen didn't know and boxed 6. AGGRAVATING FACTORS: "What makes the pain worse?" (e.g., using computer)     movement 7. OTHER SYMPTOMS: "Do you have any other symptoms?" (e.g., neck pain, swelling, rash, numbness,  fever)     Left hand was already swollen when boxing. Wrist painful  Protocols used: Hand and Wrist Pain-A-AH

## 2022-10-04 NOTE — Telephone Encounter (Signed)
Noted  

## 2022-10-04 NOTE — Telephone Encounter (Signed)
[name] can you address low blood sugar and note

## 2022-10-04 NOTE — Telephone Encounter (Signed)
It looks like Accu Chek Guide products are covered. Rxs sent.

## 2022-10-05 ENCOUNTER — Ambulatory Visit: Payer: Self-pay | Attending: Primary Care

## 2022-10-08 ENCOUNTER — Ambulatory Visit: Payer: Medicaid Other

## 2022-10-09 ENCOUNTER — Other Ambulatory Visit: Payer: Self-pay

## 2022-10-09 ENCOUNTER — Emergency Department (HOSPITAL_COMMUNITY): Payer: 59

## 2022-10-09 ENCOUNTER — Telehealth: Payer: Self-pay | Admitting: Primary Care

## 2022-10-09 ENCOUNTER — Ambulatory Visit: Payer: Self-pay | Admitting: Internal Medicine

## 2022-10-09 ENCOUNTER — Emergency Department (HOSPITAL_BASED_OUTPATIENT_CLINIC_OR_DEPARTMENT_OTHER): Payer: 59

## 2022-10-09 ENCOUNTER — Encounter (HOSPITAL_COMMUNITY): Payer: Self-pay | Admitting: Radiology

## 2022-10-09 ENCOUNTER — Emergency Department (HOSPITAL_COMMUNITY)
Admission: EM | Admit: 2022-10-09 | Discharge: 2022-10-09 | Disposition: A | Discharge status: Home / Self Care | Payer: 59 | Attending: Emergency Medicine | Admitting: Emergency Medicine

## 2022-10-09 DIAGNOSIS — M79661 Pain in right lower leg: Secondary | ICD-10-CM | POA: Diagnosis not present

## 2022-10-09 DIAGNOSIS — I10 Essential (primary) hypertension: Secondary | ICD-10-CM | POA: Insufficient documentation

## 2022-10-09 DIAGNOSIS — S63502A Unspecified sprain of left wrist, initial encounter: Secondary | ICD-10-CM | POA: Insufficient documentation

## 2022-10-09 DIAGNOSIS — R55 Syncope and collapse: Secondary | ICD-10-CM | POA: Diagnosis not present

## 2022-10-09 DIAGNOSIS — R252 Cramp and spasm: Secondary | ICD-10-CM | POA: Diagnosis not present

## 2022-10-09 DIAGNOSIS — Z794 Long term (current) use of insulin: Secondary | ICD-10-CM | POA: Diagnosis not present

## 2022-10-09 DIAGNOSIS — S6992XA Unspecified injury of left wrist, hand and finger(s), initial encounter: Secondary | ICD-10-CM | POA: Diagnosis present

## 2022-10-09 DIAGNOSIS — E162 Hypoglycemia, unspecified: Secondary | ICD-10-CM

## 2022-10-09 DIAGNOSIS — Z79899 Other long term (current) drug therapy: Secondary | ICD-10-CM | POA: Insufficient documentation

## 2022-10-09 DIAGNOSIS — X58XXXA Exposure to other specified factors, initial encounter: Secondary | ICD-10-CM | POA: Insufficient documentation

## 2022-10-09 DIAGNOSIS — E10649 Type 1 diabetes mellitus with hypoglycemia without coma: Secondary | ICD-10-CM | POA: Insufficient documentation

## 2022-10-09 LAB — CBC WITH DIFFERENTIAL/PLATELET
Abs Immature Granulocytes: 0.03 10*3/uL (ref 0.00–0.07)
Basophils Absolute: 0 10*3/uL (ref 0.0–0.1)
Basophils Relative: 1 %
Eosinophils Absolute: 0.1 10*3/uL (ref 0.0–0.5)
Eosinophils Relative: 1 %
HCT: 46.9 % (ref 39.0–52.0)
Hemoglobin: 15.2 g/dL (ref 13.0–17.0)
Immature Granulocytes: 1 %
Lymphocytes Relative: 20 %
Lymphs Abs: 1.2 10*3/uL (ref 0.7–4.0)
MCH: 27.7 pg (ref 26.0–34.0)
MCHC: 32.4 g/dL (ref 30.0–36.0)
MCV: 85.6 fL (ref 80.0–100.0)
Monocytes Absolute: 0.3 10*3/uL (ref 0.1–1.0)
Monocytes Relative: 4 %
Neutro Abs: 4.3 10*3/uL (ref 1.7–7.7)
Neutrophils Relative %: 73 %
Platelets: 242 10*3/uL (ref 150–400)
RBC: 5.48 MIL/uL (ref 4.22–5.81)
RDW: 12.5 % (ref 11.5–15.5)
WBC: 5.9 10*3/uL (ref 4.0–10.5)
nRBC: 0 % (ref 0.0–0.2)

## 2022-10-09 LAB — BASIC METABOLIC PANEL
Anion gap: 8 (ref 5–15)
BUN: 27 mg/dL — ABNORMAL HIGH (ref 6–20)
CO2: 25 mmol/L (ref 22–32)
Calcium: 8.6 mg/dL — ABNORMAL LOW (ref 8.9–10.3)
Chloride: 101 mmol/L (ref 98–111)
Creatinine, Ser: 1.74 mg/dL — ABNORMAL HIGH (ref 0.61–1.24)
GFR, Estimated: 49 mL/min — ABNORMAL LOW (ref >60)
Glucose, Bld: 257 mg/dL — ABNORMAL HIGH (ref 70–99)
Potassium: 3.7 mmol/L (ref 3.5–5.1)
Sodium: 134 mmol/L — ABNORMAL LOW (ref 135–145)

## 2022-10-09 LAB — CBG MONITORING, ED
Glucose-Capillary: 226 mg/dL — ABNORMAL HIGH (ref 70–99)
Glucose-Capillary: 258 mg/dL — ABNORMAL HIGH (ref 70–99)

## 2022-10-09 LAB — TROPONIN I (HIGH SENSITIVITY)
Troponin I (High Sensitivity): 23 ng/L — ABNORMAL HIGH (ref <18)
Troponin I (High Sensitivity): 21 ng/L — ABNORMAL HIGH (ref <18)

## 2022-10-09 MED ORDER — ACETAMINOPHEN 500 MG PO TABS
1000.0000 mg | ORAL_TABLET | Freq: Once | ORAL | Status: AC
  Administered 2022-10-09: 1000 mg via ORAL
  Filled 2022-10-09: qty 2

## 2022-10-09 MED ORDER — SODIUM CHLORIDE 0.9 % IV BOLUS
500.0000 mL | Freq: Once | INTRAVENOUS | Status: AC
  Administered 2022-10-09: 500 mL via INTRAVENOUS

## 2022-10-09 NOTE — ED Provider Notes (Cosign Needed Addendum)
[facility] Provider Note   CSN: [id number] Arrival date & time: 10/09/22  [id number]     History  Chief Complaint  Patient presents with   Hypoglycemia    [name] is a 47 y.o. male history of type 1 diabetes, hypertension presented with an episode of syncope this morning.  Patient states he also had an episode of syncope yesterday.  Both times patient lost consciousness for a few seconds.  Both events occurred after patient sugars dropped into the low 40s.  Patient states he was recently placed on a higher dose of insulin by his endocrinologist and since then has been having issues with sugars dropping.  He states this morning his sugar dropped to 45 and he began having convulsions that were witnessed by his family.  Patient did not fall or hit his head or on any antiepileptic medications.  Patient states after he was placed in the ambulance he began having right calf cramping which he believes is related to his convulsions.  Patient states now feels back to baseline after receiving dextrose from EMS.  Patient's blood pressure was elevated for EMS at 185/85.  Patient was recently placed on lisinopril/hydrochlorothiazide on 320 by his primary care provider for his high blood pressure.  Patient denied chest pain, shortness of breath, abdominal pain, nausea/vomiting, visual changes, headache, history of blood clots, change in sensation/motor skills, fever/chills  Home Medications Prior to Admission medications   Medication Sig Start Date End Date Taking? Authorizing Provider  Accu-Chek Softclix Lancets lancets Use to check blood sugar up to 6 times daily. 10/04/22   Charlott Rakes, MD  Blood Glucose Monitoring Suppl (ACCU-CHEK GUIDE) w/Device KIT Use to check blood sugar up to 6 times daily. 10/04/22   Charlott Rakes, MD  Blood Pressure Monitoring ([employer] ADVANCED BP MONITOR) DEVI 1 Device by Does not apply route daily. 03/20/22   Brunetta Jeans,  PA-C  Continuous Blood Gluc Receiver (FREESTYLE LIBRE 2 READER) DEVI Use to check blood sugar three times daily. E10.8 03/07/22   Gildardo Pounds, NP  Continuous Blood Gluc Sensor (FREESTYLE LIBRE 2 SENSOR) MISC Use to check blood sugar three times daily. Change sensor every 2 weeks. E10.8 03/07/22   Charlott Rakes, MD  FLUoxetine (PROZAC) 10 MG capsule Take 1 capsule (10 mg total) by mouth daily. 02/02/22 02/02/23  Ajibola, Rosezella Florida A, NP  gabapentin (NEURONTIN) 300 MG capsule Take 1 capsule (300 mg total) by mouth daily. 08/25/22   Linus Galas, MD  glucose blood (ACCU-CHEK GUIDE) test strip Use to check blood sugar up to 6 times daily. 10/04/22   Charlott Rakes, MD  ibuprofen (ADVIL) 800 MG tablet Take 1 tablet (800 mg total) by mouth every 8 (eight) hours as needed. Patient taking differently: Take 800 mg by mouth every 8 (eight) hours as needed for mild pain. 02/22/21   Gildardo Pounds, NP  Insulin Glargine [facility]) 100 UNIT/ML Inject 20 Units into the skin 2 (two) times daily. 09/26/22   Kerin Perna, NP  insulin lispro (HUMALOG) 100 UNIT/ML injection Inject 0.08 mLs (8 Units total) into the skin 3 (three) times daily with meals. 09/26/22   Kerin Perna, NP  Insulin Pen Needle (B-D UF III MINI PEN NEEDLES) 31G X 5 MM MISC Insert into the skin as prescribed 07/26/21   Argentina Donovan, PA-C  Insulin Pen Needle 32G X 4 MM MISC Use with Humulin 70/30 kwikpen 10/01/22   Kerin Perna,  NP  Insulin Syringes, Disposable, U-100 0.3 ML MISC Use to inject 8 units three times a day 10/02/22   Kerin Perna, NP  lisinopril-hydrochlorothiazide (ZESTORETIC) 20-25 MG tablet Take 1 tablet by mouth daily. 09/26/22   Kerin Perna, NP  methocarbamol (ROBAXIN) 500 MG tablet Take 1 tablet (500 mg total) by mouth every 8 (eight) hours as needed for muscle spasms. Patient not taking: Reported on 08/23/2022 02/22/21   Gildardo Pounds, NP  ondansetron (ZOFRAN-ODT) 4 MG  disintegrating tablet Take 1 tablet (4 mg total) by mouth every 8 (eight) hours as needed for nausea or vomiting. 09/17/22   Lamptey, Myrene Galas, MD  pantoprazole (PROTONIX) 40 MG tablet Take 1 tablet (40 mg total) by mouth daily. Patient not taking: Reported on 08/23/2022 02/01/22   Hayden Rasmussen, MD  simvastatin (ZOCOR) 20 MG tablet TAKE ONE TABLET BY MOUTH at bedtime Patient taking differently: Take 20 mg by mouth every evening. 10/11/21   Charlott Rakes, MD  triamcinolone cream (KENALOG) 0.1 % Apply 1 Application topically 2 (two) times daily. Do not use for longer than 2 weeks in a row in any 1 location Patient not taking: Reported on 08/23/2022 06/22/22   Prosperi, Christian H, PA-C      Allergies    Geodon [ziprasidone hcl], Ziprasidone mesylate, and Amlodipine    Review of Systems   Review of Systems See HPI Physical Exam Updated Vital Signs BP (!) 185/85   Pulse 69   Temp 97.6 F (36.4 C) (Oral)   Resp 16   Ht 5\' 10"  (1.778 m)   Wt 77.1 kg   SpO2 100%   BMI 24.39 kg/m  Physical Exam Vitals reviewed.  Constitutional:      General: He is not in acute distress. HENT:     Head: Normocephalic and atraumatic.  Eyes:     Extraocular Movements: Extraocular movements intact.     Conjunctiva/sclera: Conjunctivae normal.     Pupils: Pupils are equal, round, and reactive to light.  Cardiovascular:     Rate and Rhythm: Normal rate and regular rhythm.     Pulses: Normal pulses.     Heart sounds: Normal heart sounds.     Comments: 2+ bilateral radial/dorsalis pedis pulses with regular rate Pulmonary:     Effort: Pulmonary effort is normal. No respiratory distress.     Breath sounds: Normal breath sounds.  Abdominal:     Palpations: Abdomen is soft.     Tenderness: There is no abdominal tenderness. There is no guarding or rebound.  Musculoskeletal:        General: Normal range of motion.     Cervical back: Normal range of motion and neck supple.     Comments: 5 out of 5  bilateral grip/leg extension strength Left wrist: No scaphoid tenderness, full active range of motion however patient endorses pain when wrist is flexed.  Pain is located in the middle carpal bones.  Patient has good grip, pulses, sensation distally with no overlying skin color changes.  No step-off/crepitus/abnormalities palpated  Skin:    General: Skin is warm and dry.     Capillary Refill: Capillary refill takes less than 2 seconds.  Neurological:     General: No focal deficit present.     Mental Status: He is alert and oriented to person, place, and time.     Sensory: Sensation is intact.     Motor: Motor function is intact.     Coordination: Coordination is intact.  Comments: Sensation intact in all 4 limbs Vision grossly intact Cranial nerves III through XII intact Patient unwilling to ambulate due to right calf ramping  Psychiatric:        Mood and Affect: Mood normal.     ED Results / Procedures / Treatments   Labs (all labs ordered are listed, but only abnormal results are displayed) Labs Reviewed  BASIC METABOLIC PANEL - Abnormal; Notable for the following components:      Result Value   Sodium 134 (*)    Glucose, Bld 257 (*)    BUN 27 (*)    Creatinine, Ser 1.74 (*)    Calcium 8.6 (*)    GFR, Estimated 49 (*)    All other components within normal limits  CBG MONITORING, ED - Abnormal; Notable for the following components:   Glucose-Capillary 226 (*)    All other components within normal limits  TROPONIN I (HIGH SENSITIVITY) - Abnormal; Notable for the following components:   Troponin I (High Sensitivity) 23 (*)    All other components within normal limits  CBC WITH DIFFERENTIAL/PLATELET  TROPONIN I (HIGH SENSITIVITY)    EKG None  Radiology CT Head Wo Contrast  Result Date: 10/09/2022 CLINICAL DATA:  Syncope EXAM: CT HEAD WITHOUT CONTRAST TECHNIQUE: Contiguous axial images were obtained from the base of the skull through the vertex without intravenous  contrast. RADIATION DOSE REDUCTION: This exam was performed according to the departmental dose-optimization program which includes automated exposure control, adjustment of the mA and/or kV according to patient size and/or use of iterative reconstruction technique. COMPARISON:  12/06/2013 FINDINGS: Brain: No acute intracranial findings are seen. There are no signs of bleeding within the cranium. Ventricles are unremarkable. Cortical sulci are prominent. Vascular: Unremarkable. Skull: No acute findings are seen. Sinuses/Orbits: Unremarkable. Other: No significant interval changes are noted. IMPRESSION: No acute intracranial findings are seen in noncontrast CT brain. Electronically Signed   By: Elmer Picker M.D.   On: 10/09/2022 09:31   DG Wrist Complete Left  Result Date: 10/09/2022 CLINICAL DATA:  Fall EXAM: LEFT WRIST - COMPLETE 3+ VIEW COMPARISON:  Hand radiographs 07/28/2016 FINDINGS: There is no acute fracture or dislocation. Bony alignment is normal. The joint spaces are preserved. There is no erosive change. The soft tissues are unremarkable. IMPRESSION: No evidence of acute injury in the wrist. Electronically Signed   By: Valetta Mole M.D.   On: 10/09/2022 08:13    Procedures Procedures    Medications Ordered in ED Medications  sodium chloride 0.9 % bolus 500 mL (500 mLs Intravenous New Bag/Given 10/09/22 0903)    ED Course/ Medical Decision Making/ A&P                             Medical Decision Making Amount and/or Complexity of Data Reviewed Labs: ordered. Radiology: ordered.  Risk OTC drugs.   Zykeem N Toto 47 y.o. presented today for syncopal episodes. Working DDx that I considered at this time includes, but not limited to, hypoglycemia, CVA/TIA, electrolyte abnormalities, dehydration, DVT, ACS, arrhythmia.  R/o DDx: DVT, ACS, arrhythmia, electrolyte imbalance, CVA/TIA, dehydration: These are considered less likely due to history of present illness and physical exam  findings  Review of prior external notes: 09/26/2022 progress Notes  Unique Tests and My Interpretation:  CBG: 226 Troponin: 23, 21 CBC: Unremarkable BMP: Glucose 257 EKG: Biatrial enlargement, sinus rate and rhythm, no ST depression/elevation or blocks noted Left wrist x-ray: No acute  osseous changes  Discussion with Independent Historian: None  Discussion of Management of Tests: None  Risk: Low: based on diagnostic testing/clinical impression and treatment plan  Risk Stratification Score: none  Plan: Patient presented for syncopal episodes. On exam patient was in no acute distress and had stable vitals.  Patient had unremarkable physical exam.  Patient's sugars in the ED via CBG were 226 which is significantly better than 45 and I suspect his jumped up after receiving the dextrose via EMS however his sugars will be monitored. I suspect patient's reported convulsions were due to hypoglycemia as opposed to intracranial or electrolyte abnormalities.  Patient blood pressure is 185/85 however I suspect this is most likely normal for him as his baseline blood pressures in the EMR are around the same.  Labs will be ordered along with a CT scan to rule out any intracranial pathology.  Ultrasound be ordered of patient's right calf as patient endorses right calf cramping to rule out a DVT.  Patient sugars will be monitored and if continuing to rise will be given a little bit of fluid.  I suspect patient will be discharged with endocrine follow-up to have his insulin regimen reevaluated due to episodes of syncope with hypoglycemia on his new regimen.  Patient stable at this time.  Patient told nurse that he did not fact fall on his left wrist and that his left wrist hurts.  I went to evaluate the patient and patient stated he must of fallen on his wrist and that the middle part of his wrist hurts when he flexes his wrist.  Patient had no scaphoid tenderness.  Left wrist x-ray will be ordered.  Patient  had good pulses, motor, sensation in his left hand and wrist.  I tried to contact patient's emergency contact and she stated she had not been in town to witness either the syncopal episodes.  I called patient's mother of the house he lives then and she did not pick up and I left a voicemail.  It is unclear whether the patient fell at this time however CT is already been ordered and patient has had a negative neuroexam up into this point without signs of trauma.  Patient stable at this time.  Patient's x-ray was negative for any acute osseous changes.  Patient's glucose continues to rise from 226 08/11/1953 and so 500 mL of normal saline was ordered.  Patient's troponin was also 23 which is higher than previous results and so delta troponin will be obtained.  Patient stable this time.  Patient's delta troponin was negative.  After the patient on his labs and imaging and patient stated he felt much better with stable glucose of 226.  I suspect patient's glucose went up as he has not taken his insulin today via as opposed to any infectious pathology.  Patient will be discharged with PCP follow-up as I suspect his insulin regiment needs to be reevaluated and is too high at this time.  At time of discharge patient's sugar was 258.  Patient was given Tylenol for his leg pain.  Patient was instructed to follow-up with his primary care provider about recent symptoms and stated he has an appointment in June with an endocrinologist.  In the meantime he is to return to the sliding scale as his new insulin regimen is perhaps too strong.  Patient stable for discharge.  Patient was given return precautions. Patient stable for discharge at this time.  Patient verbalized understanding of plan.  Final Clinical Impression(s) / ED Diagnoses Final diagnoses:  Hypoglycemia  Sprain of left wrist, initial encounter    Rx / DC Orders ED Discharge Orders     None         Elvina Sidle 10/09/22  1117    Chuck Hint, PA-C 10/09/22 1119    Pattricia Boss, MD 10/11/22 1339

## 2022-10-09 NOTE — Telephone Encounter (Signed)
Pt came by the office today and provider has spoken with pt

## 2022-10-09 NOTE — Discharge Instructions (Addendum)
Please follow-up with your primary care provider regarding recent symptoms and ER visit to have your insulin regimen reevaluated as I suspect is too high at this time.  Please return to your sliding scale as we discussed once we are able to see your primary care provider and endocrinologist.  Please take in fluids and eat as you normally would and monitor your symptoms and sugar.  If symptoms worsen please return to the ER.

## 2022-10-09 NOTE — ED Triage Notes (Signed)
BIBA from home c/o hypoglycemic episode. Hx of T1D. Pt was drowsy and diaphoretic upon EMS arrival. Pt has been having issues with hypoglycemia recently. States that he has been having some issues with insurance and getting insulin. Family on scene states that they witnessed some seizure like activity as well. Denies hx of seizures, despite hx of seizures being listed in medical history. Does not take any antiepileptic meds at this time. Pt A&Ox4 upon arrival. Pt c/o some muscle cramping to R calf since getting into ambulance.  CBG 45 initially, 50g IV dextrose administered at 0533 and CBG increased to 140 afterward. Hypertensive for EMS. Recently changed BP meds.

## 2022-10-09 NOTE — Telephone Encounter (Signed)
Copied from [place] [id number] [id number]. Topic: General - Other >> Oct 09, 2022  1:04 PM Dominique A wrote: Reason for CRM: Pt is calling back to see if his note is ready for pick up. Per pt the paramedics came to his house yesterday and today and pt states that he is needing his note as soon as possible. Please call pt back.

## 2022-10-09 NOTE — Progress Notes (Signed)
Lower extremity venous right study completed.  Preliminary results relayed to [place], [place].   See CV Proc for preliminary results report.   Darlin Coco, RDMS, RVT

## 2022-10-26 ENCOUNTER — Ambulatory Visit: Payer: Self-pay | Admitting: Pharmacist

## 2022-10-26 ENCOUNTER — Ambulatory Visit: Payer: Self-pay

## 2022-10-26 ENCOUNTER — Ambulatory Visit (INDEPENDENT_AMBULATORY_CARE_PROVIDER_SITE_OTHER): Payer: Self-pay

## 2022-10-26 ENCOUNTER — Telehealth: Payer: Self-pay | Admitting: Primary Care

## 2022-10-26 ENCOUNTER — Telehealth (INDEPENDENT_AMBULATORY_CARE_PROVIDER_SITE_OTHER): Payer: Self-pay | Admitting: Primary Care

## 2022-10-26 NOTE — Telephone Encounter (Signed)
Called to F/U a/b financial assistance - pt was in the hospital about 2 weeks ago and has been approved for full Medicaid; hasn't recv'd card, etc. Will connect w/ DSS to learn more and mail CAFA app to pt; pt to call me once CAFA app is recv'd

## 2022-10-26 NOTE — Telephone Encounter (Signed)
Will forward to provider  

## 2022-10-26 NOTE — Telephone Encounter (Signed)
  Chief Complaint: PT believes he has high blood sugar Symptoms: Muscle cramps, dizziness Frequency: ongoing - today Pertinent Negatives: Patient denies  Disposition: ED /[x] Urgent Care (no appt availability in office) / Appointment(In office/virtual)/  [place] [facility]/ Home Care/ Refused Recommended Disposition /[] [place] [facility]/  Follow-up with PCP Additional Notes: PT called with leg cramps and dizziness. PT believes his blood sugar is high. PT is out of his testing supplies and is financially unable to obtain them. PT has not had supplies for 2 weeks. Pt states that he cancelled his appts today because as he was leaving the house he started having leg cramps, and could not come into the office until this resolved.    Called [facility] number  - no answer and sent teams message which has not been answered.  Pt will go to UC for care.      Summary: low BS/dizzy   Pt called requesting something to check his blood sugar, preferred pharmacy is [facility]. Says he does not have anything to check his blood sugar.  Pt just cancelled two appts today at [facility] because his "body is cramping so he believes his blood sugar must be high" he also experienced dizziness last night.  ----- Message from Candee Furbish sent at 10/26/2022  8:17 AM EDT ----- Pt stated his BS has been getting really low he is unsure of what his sugar levels are right now .. says when he gets up suddenly he gets dizzy and experiencing body cramping .  Pt disconnected the call while on hold.  Seeking clinical advice.     Reason for Disposition  [1] Symptoms of high blood sugar (e.g., abnormally thirsty, frequent urination, weight loss) AND [2] not able to test blood glucose  Answer Assessment - Initial Assessment Questions 1. BLOOD GLUCOSE: "What is your blood glucose level?"      Unsure 2. ONSET: "When did you check the blood glucose?"     Did not check 3. USUAL RANGE: "What is your glucose level  usually?" (e.g., usual fasting morning value, usual evening value)     unsure 4. KETONES: "Do you check for ketones (urine or blood test strips)?" If Yes, ask: "What does the test show now?"       5. TYPE 1 or 2:  "Do you know what type of diabetes you have?"  (e.g., Type 1, Type 2, Gestational; doesn't know)      Type 1 6. INSULIN: "Do you take insulin?" "What type of insulin(s) do you use? What is the mode of delivery? (syringe, pen; injection or pump)?"      yes 7. DIABETES PILLS: "Do you take any pills for your diabetes?" If Yes, ask: "Have you missed taking any pills recently?"      8. OTHER SYMPTOMS: "Do you have any symptoms?" (e.g., fever, frequent urination, difficulty breathing, dizziness, weakness, vomiting)     Dizziness, muscle cramps  Protocols used: Diabetes - High Blood Sugar-A-AH

## 2022-10-29 ENCOUNTER — Telehealth: Payer: Self-pay | Admitting: Primary Care

## 2022-10-29 NOTE — Telephone Encounter (Signed)
Met w/ pt and discussed req'd docs for [facility] and CAFA; also referred pt to DSS team for Medicaid (pt's Medicaid Review Worker - Arnetha Gula, [phone]) needs further info to complete app and determine eligibility

## 2022-10-29 NOTE — Telephone Encounter (Signed)
Gave pt Medicaid Review Worker # [phone] Arnetha Gula) per DSS Medicaid staff instruction - more info needed regarding pt's employment status

## 2022-10-31 ENCOUNTER — Other Ambulatory Visit: Payer: Self-pay

## 2022-11-01 ENCOUNTER — Other Ambulatory Visit: Payer: Self-pay

## 2022-11-01 ENCOUNTER — Ambulatory Visit: Payer: Self-pay

## 2022-11-05 ENCOUNTER — Telehealth: Payer: Self-pay | Admitting: Primary Care

## 2022-11-05 NOTE — Telephone Encounter (Signed)
Copied from CRM [id number]. Topic: General - Other >> Nov 05, 2022  2:42 PM Dominique A wrote: Reason for CRM: Adalie with [facility] states that the pt is wanting all of his prescriptions transferred to their pharmacy. Please advise. [facility] - [place], [place] - [address]  Phone: [phone] Fax: [phone]

## 2022-11-06 ENCOUNTER — Ambulatory Visit: Payer: 59 | Attending: Pharmacist | Admitting: Pharmacist

## 2022-11-06 ENCOUNTER — Other Ambulatory Visit (INDEPENDENT_AMBULATORY_CARE_PROVIDER_SITE_OTHER): Payer: Self-pay

## 2022-11-06 DIAGNOSIS — N183 Chronic kidney disease, stage 3 unspecified: Secondary | ICD-10-CM

## 2022-11-06 DIAGNOSIS — E1022 Type 1 diabetes mellitus with diabetic chronic kidney disease: Secondary | ICD-10-CM

## 2022-11-06 MED ORDER — INSULIN LISPRO 100 UNIT/ML IJ SOLN
8.0000 [IU] | Freq: Three times a day (TID) | INTRAMUSCULAR | 3 refills | Status: DC
Start: 2022-11-06 — End: 2022-11-07

## 2022-11-06 MED ORDER — LISINOPRIL-HYDROCHLOROTHIAZIDE 20-25 MG PO TABS: 1.0000 | ORAL_TABLET | Freq: Every day | ORAL | 1 refills | Status: DC

## 2022-11-06 MED ORDER — ACCU-CHEK SOFTCLIX LANCETS MISC: 6 refills | Status: AC

## 2022-11-06 MED ORDER — ACCU-CHEK GUIDE W/DEVICE KIT: PACK | 0 refills | Status: AC

## 2022-11-06 MED ORDER — BASAGLAR KWIKPEN 100 UNIT/ML ~~LOC~~ SOPN
20.0000 [IU] | PEN_INJECTOR | Freq: Two times a day (BID) | SUBCUTANEOUS | 3 refills | Status: DC
Start: 2022-11-06 — End: 2022-11-16

## 2022-11-06 MED ORDER — INSULIN SYRINGES (DISPOSABLE) U-100 0.3 ML MISC: 11 refills | Status: DC

## 2022-11-06 NOTE — Telephone Encounter (Signed)
All rxs that [name] fill are sent to [facility]

## 2022-11-07 ENCOUNTER — Other Ambulatory Visit: Payer: Self-pay | Admitting: Pharmacist

## 2022-11-07 ENCOUNTER — Other Ambulatory Visit: Payer: Self-pay

## 2022-11-07 MED ORDER — NOVOLOG FLEXPEN 100 UNIT/ML ~~LOC~~ SOPN: 8.0000 [IU] | PEN_INJECTOR | Freq: Three times a day (TID) | SUBCUTANEOUS | 0 refills | Status: DC

## 2022-11-07 NOTE — Telephone Encounter (Signed)
Luke would you be able to take care of this  

## 2022-11-07 NOTE — Telephone Encounter (Signed)
Copied from CRM [id number]. Topic: General - Other >> Nov 07, 2022  1:01 PM Ja-Kwan M wrote: Reason for CRM: Adalee with [facility] reports that pt insurance will not cover Humalog so she would like to request a new Rx for Novolog Pen. Adalee also stated that Basaglar costs $304 and she is not sure that pt can afford it so she would like to request a Rx for an alternate medication. Cb# [phone]

## 2022-11-08 ENCOUNTER — Ambulatory Visit (HOSPITAL_COMMUNITY)
Admission: EM | Admit: 2022-11-08 | Discharge: 2022-11-08 | Disposition: A | Discharge status: Home / Self Care | Payer: 59 | Attending: Nurse Practitioner | Admitting: Nurse Practitioner

## 2022-11-08 ENCOUNTER — Ambulatory Visit (INDEPENDENT_AMBULATORY_CARE_PROVIDER_SITE_OTHER): Payer: 59

## 2022-11-08 ENCOUNTER — Encounter (HOSPITAL_COMMUNITY): Payer: Self-pay

## 2022-11-08 ENCOUNTER — Other Ambulatory Visit: Payer: Self-pay

## 2022-11-08 DIAGNOSIS — S63602A Unspecified sprain of left thumb, initial encounter: Secondary | ICD-10-CM

## 2022-11-08 NOTE — Discharge Instructions (Signed)
The x-ray today does not show any broken bones in your thumb Please use ice 15 minutes on, 45 minutes off every hour while awake to help with pain and swelling You can also keep your thumb elevated above the level of your heart to help with pain You can take Tylenol alternating with ibuprofen as needed for pain

## 2022-11-08 NOTE — ED Triage Notes (Signed)
Pt states left thumb pain states 2 nights ago his blood sugar was low and he thinks he hyperextended it. Left thumb swollen.

## 2022-11-08 NOTE — ED Provider Notes (Signed)
[facility]    CSN: [id number] Arrival date & time: 11/08/22  1748      History   Chief Complaint Chief Complaint  Patient presents with   Hand Pain    HPI [name] is a 47 y.o. male.   Patient presents today with left thumb pain and swelling that began a couple of days ago.  Reports he woke up in the middle of the night a couple nights ago disorientated on the floor.  Reports he checked his blood sugar and it was low; once the blood sugar got "squared away" he noticed his thumb was painful and swollen.  Thinks he may have rolled off his bed while he was sleeping.  Thinks he may have hyperextended the thumb, however does not recall any injury to the thumb.  No numbness or tingling in the tip of the thumb.  Patient denies decreased sensation in the thumb and reports it is difficult to move it because of pain.    Past Medical History:  Diagnosis Date   Diabetes mellitus    Hypertension    Seizures (HCC)     Patient Active Problem List   Diagnosis Date Noted   DKA (diabetic ketoacidosis) (HCC) 08/22/2022   CKD (chronic kidney disease), stage III (HCC) 04/02/2019   Prolonged QT interval 04/02/2019   Microalbuminuria due to type 1 diabetes mellitus (HCC) 01/23/2017   Knee pain 03/16/2016   Scrotal pain 01/13/2016   Seizure disorder (HCC) 03/10/2015   Type 1 diabetes mellitus with hyperlipidemia (HCC) 03/10/2015   DIABETIC PERIPHERAL NEUROPATHY 07/10/2010   UNSPECIFIED VITAMIN D DEFICIENCY 06/16/2009   PTSD (post-traumatic stress disorder) 03/26/2008   Essential hypertension, benign 03/24/2008   SLEEP DISORDER 03/24/2008   SLEEP DEPRIVATION 02/09/2008   DM (diabetes mellitus), type 1 with complications (HCC) 04/04/2007    Past Surgical History:  Procedure Laterality Date   HERNIA REPAIR         Home Medications    Prior to Admission medications   Medication Sig Start Date End Date Taking? Authorizing Provider  Accu-Chek Softclix Lancets lancets  Use to check blood sugar up to 6 times daily. 11/06/22   [name], NP  Blood Glucose Monitoring Suppl (ACCU-CHEK GUIDE) w/Device KIT Use to check blood sugar up to 6 times daily. 11/06/22   [name], NP  Blood Pressure Monitoring ([employer] ADVANCED BP MONITOR) DEVI 1 Device by Does not apply route daily. 03/20/22   Waldon Merl, PA-C  Continuous Blood Gluc Receiver (FREESTYLE LIBRE 2 READER) DEVI Use to check blood sugar three times daily. E10.8 03/07/22   Claiborne Rigg, NP  Continuous Blood Gluc Sensor (FREESTYLE LIBRE 2 SENSOR) MISC Use to check blood sugar three times daily. Change sensor every 2 weeks. E10.8 03/07/22   Hoy Register, MD  FLUoxetine (PROZAC) 10 MG capsule Take 1 capsule (10 mg total) by mouth daily. 02/02/22 02/02/23  Ajibola, Gerrianne Scale A, NP  gabapentin (NEURONTIN) 300 MG capsule Take 1 capsule (300 mg total) by mouth daily. 08/25/22   Lyndle Herrlich, MD  glucose blood (ACCU-CHEK GUIDE) test strip Use to check blood sugar up to 6 times daily. 10/04/22   Hoy Register, MD  ibuprofen (ADVIL) 800 MG tablet Take 1 tablet (800 mg total) by mouth every 8 (eight) hours as needed. Patient taking differently: Take 800 mg by mouth every 8 (eight) hours as needed for mild pain. 02/22/21   Claiborne Rigg, NP  insulin aspart (NOVOLOG FLEXPEN) 100 UNIT/ML  FlexPen Inject 8 Units into the skin 3 (three) times daily with meals. 11/07/22   Hoy Register, MD  Insulin Glargine (BASAGLAR KWIKPEN) 100 UNIT/ML Inject 20 Units into the skin 2 (two) times daily. 11/06/22   [name], NP  Insulin Pen Needle (B-D UF III MINI PEN NEEDLES) 31G X 5 MM MISC Insert into the skin as prescribed 07/26/21   [name], PA-C  Insulin Pen Needle 32G X 4 MM MISC Use with Humulin 70/30 kwikpen 10/01/22   [name], NP  Insulin Syringes, Disposable, U-100 0.3 ML MISC Use to inject 8 units three times a day 11/06/22   [name], NP  lisinopril-hydrochlorothiazide  (ZESTORETIC) 20-25 MG tablet Take 1 tablet by mouth daily. 11/06/22   [name], NP  methocarbamol (ROBAXIN) 500 MG tablet Take 1 tablet (500 mg total) by mouth every 8 (eight) hours as needed for muscle spasms. Patient not taking: Reported on 08/23/2022 02/22/21   Claiborne Rigg, NP  ondansetron (ZOFRAN-ODT) 4 MG disintegrating tablet Take 1 tablet (4 mg total) by mouth every 8 (eight) hours as needed for nausea or vomiting. 09/17/22   Lamptey, Britta Mccreedy, MD  pantoprazole (PROTONIX) 40 MG tablet Take 1 tablet (40 mg total) by mouth daily. Patient not taking: Reported on 08/23/2022 02/01/22   Terrilee Files, MD  simvastatin (ZOCOR) 20 MG tablet TAKE ONE TABLET BY MOUTH at bedtime Patient taking differently: Take 20 mg by mouth every evening. 10/11/21   Hoy Register, MD  triamcinolone cream (KENALOG) 0.1 % Apply 1 Application topically 2 (two) times daily. Do not use for longer than 2 weeks in a row in any 1 location Patient not taking: Reported on 08/23/2022 06/22/22   Prosperi, Harrel Carina, PA-C    Family History Family History  Problem Relation Age of Onset   Hypertension Mother    Diabetes Paternal Uncle     Social History Social History   Tobacco Use   Smoking status: Never   Smokeless tobacco: Never  Vaping Use   Vaping Use: Never used  Substance Use Topics   Alcohol use: Not Currently    Alcohol/week: 1.0 standard drink of alcohol    Types: 1 Glasses of wine per week    Comment: rarely   Drug use: No     Allergies   Geodon [ziprasidone hcl], Ziprasidone mesylate, and Amlodipine   Review of Systems Review of Systems Per HPI  Physical Exam Triage Vital Signs ED Triage Vitals  Enc Vitals Group     BP 11/08/22 1802 (!) 170/82     Pulse Rate 11/08/22 1802 70     Resp 11/08/22 1802 16     Temp 11/08/22 1802 98.6 F (37 C)     Temp Source 11/08/22 1802 Oral     SpO2 11/08/22 1802 97 %     Weight --      Height --      Head Circumference --      Peak  Flow --      Pain Score 11/08/22 1803 5     Pain Loc --      Pain Edu? --      Excl. in GC? --    No data found.  Updated Vital Signs BP (!) 170/82 (BP Location: Left Arm)   Pulse 70   Temp 98.6 F (37 C) (Oral)   Resp 16   SpO2 97%   Visual Acuity Right Eye Distance:   Left Eye  Distance:   Bilateral Distance:    Right Eye Near:   Left Eye Near:    Bilateral Near:     Physical Exam Vitals and nursing note reviewed.  Constitutional:      General: He is not in acute distress.    Appearance: Normal appearance. He is not toxic-appearing.  Pulmonary:     Effort: Pulmonary effort is normal. No respiratory distress.  Musculoskeletal:       Hands:     Comments: Inspection: Swelling diffusely to the left thumb; no obvious deformity, redness, or bruising Palpation: Left thumb tender to palpation diffusely; no obvious deformities palpated ROM: Full ROM to left thumb, although painful Neurovascular: neurovascularly intact in left upper extremity patient is mildly hyper  Skin:    General: Skin is warm and dry.     Capillary Refill: Capillary refill takes less than 2 seconds.     Coloration: Skin is not jaundiced or pale.     Findings: No erythema.  Neurological:     Mental Status: He is alert and oriented to person, place, and time.  Psychiatric:        Behavior: Behavior is cooperative.      UC Treatments / Results  Labs (all labs ordered are listed, but only abnormal results are displayed) Labs Reviewed - No data to display  EKG   Radiology DG Finger Thumb Left  Result Date: 11/08/2022 CLINICAL DATA:  Pain EXAM: LEFT THUMB 2+V COMPARISON:  None Available. FINDINGS: There is no evidence of fracture or dislocation. There is no evidence of arthropathy or other focal bone abnormality. Soft tissues are unremarkable. IMPRESSION: Negative. Electronically Signed   By: Darliss Cheney M.D.   On: 11/08/2022 18:42    Procedures Procedures (including critical care  time)  Medications Ordered in UC Medications - No data to display  Initial Impression / Assessment and Plan / UC Course  I have reviewed the triage vital signs and the nursing notes.  Pertinent labs & imaging results that were available during my care of the patient were reviewed by me and considered in my medical decision making (see chart for details).   Patient is well-appearing, afebrile, not tachycardic, not tachypneic, oxygenating well on room air.  Patient is mildly hypertensive today in urgent care.  1. Sprain of left thumb, unspecified site of digit, initial encounter Discussed with patient that x-ray imaging is negative for bony abnormalities today Recommended rest, ice, elevation, Tylenol/ibuprofen as needed for pain Follow-up with persistent or worsening symptoms despite treatment  The patient was given the opportunity to ask questions.  All questions answered to their satisfaction.  The patient is in agreement to this plan.   Final Clinical Impressions(s) / UC Diagnoses   Final diagnoses:  Sprain of left thumb, unspecified site of digit, initial encounter     Discharge Instructions      The x-ray today does not show any broken bones in your thumb Please use ice 15 minutes on, 45 minutes off every hour while awake to help with pain and swelling You can also keep your thumb elevated above the level of your heart to help with pain You can take Tylenol alternating with ibuprofen as needed for pain    ED Prescriptions   None    PDMP not reviewed this encounter.   Valentino Nose, NP 11/08/22 1859

## 2022-11-13 ENCOUNTER — Telehealth: Payer: Self-pay | Admitting: Primary Care

## 2022-11-13 NOTE — Telephone Encounter (Signed)
Copied from CRM [id number]. Topic: General - Other >> Nov 13, 2022  4:48 PM Everette C wrote: Reason for CRM: The patient has called on behalf of [employer] to follow up on a request for an insulin pump   Please contact the patient further when possible

## 2022-11-14 NOTE — Telephone Encounter (Signed)
Tried returning pt call was unable to reach pt due to call can't be completed as dailed

## 2022-11-15 ENCOUNTER — Telehealth: Payer: Self-pay

## 2022-11-15 ENCOUNTER — Ambulatory Visit (INDEPENDENT_AMBULATORY_CARE_PROVIDER_SITE_OTHER): Payer: 59 | Admitting: Primary Care

## 2022-11-15 VITALS — BP 121/73 | HR 72 | Resp 16 | Wt 168.0 lb

## 2022-11-15 DIAGNOSIS — N183 Chronic kidney disease, stage 3 unspecified: Secondary | ICD-10-CM | POA: Diagnosis not present

## 2022-11-15 DIAGNOSIS — M79645 Pain in left finger(s): Secondary | ICD-10-CM

## 2022-11-15 DIAGNOSIS — I129 Hypertensive chronic kidney disease with stage 1 through stage 4 chronic kidney disease, or unspecified chronic kidney disease: Secondary | ICD-10-CM | POA: Diagnosis not present

## 2022-11-15 DIAGNOSIS — Z794 Long term (current) use of insulin: Secondary | ICD-10-CM | POA: Diagnosis not present

## 2022-11-15 DIAGNOSIS — E1022 Type 1 diabetes mellitus with diabetic chronic kidney disease: Secondary | ICD-10-CM | POA: Diagnosis not present

## 2022-11-15 DIAGNOSIS — Z23 Encounter for immunization: Secondary | ICD-10-CM | POA: Diagnosis not present

## 2022-11-15 DIAGNOSIS — E109 Type 1 diabetes mellitus without complications: Secondary | ICD-10-CM

## 2022-11-15 DIAGNOSIS — Z1211 Encounter for screening for malignant neoplasm of colon: Secondary | ICD-10-CM

## 2022-11-15 LAB — POCT GLYCOSYLATED HEMOGLOBIN (HGB A1C): HbA1c, POC (controlled diabetic range): 9.4 % — AB (ref 0.0–7.0)

## 2022-11-15 MED ORDER — INSULIN SYRINGES (DISPOSABLE) U-100 0.3 ML MISC
11 refills | Status: DC
Start: 2022-11-15 — End: 2023-09-25

## 2022-11-15 MED ORDER — NOVOLOG FLEXPEN 100 UNIT/ML ~~LOC~~ SOPN
8.0000 [IU] | PEN_INJECTOR | Freq: Three times a day (TID) | SUBCUTANEOUS | 0 refills | Status: DC
Start: 2022-11-15 — End: 2022-11-19

## 2022-11-15 NOTE — Progress Notes (Signed)
[facility]  [name], is a 47 y.o. male  ZOX:[id number]  [id number]  DOB - Jan 10, 1976  DM      Subjective:   [name] is a 47 y.o. male here today for a follow up visit for T2D. Denies polyuria, polydipsia , polyphagia or vision changes. Patient has No headache, No chest pain, No abdominal pain - No Nausea, No new weakness tingling or numbness, No Cough - shortness of breath  No problems updated.  Allergies  Allergen Reactions   Geodon [Ziprasidone Hcl] Other (See Comments)    Severe muscle spasms (ENTIRE BODY)- had to call EMS   Ziprasidone Mesylate Other (See Comments)    "severe muscle spasms"- Had to call EMS   Amlodipine Swelling    Past Medical History:  Diagnosis Date   Diabetes mellitus    Hypertension    Seizures (HCC)     Current Outpatient Medications on File Prior to Visit  Medication Sig Dispense Refill   lisinopril-hydrochlorothiazide (ZESTORETIC) 20-25 MG tablet Take 1 tablet by mouth daily. 90 tablet 1   Accu-Chek Softclix Lancets lancets Use to check blood sugar up to 6 times daily. 100 each 6   Blood Glucose Monitoring Suppl (ACCU-CHEK GUIDE) w/Device KIT Use to check blood sugar up to 6 times daily. 1 kit 0   Blood Pressure Monitoring ([employer]S ADVANCED BP MONITOR) DEVI 1 Device by Does not apply route daily. 1 each 0   Continuous Blood Gluc Receiver (FREESTYLE LIBRE 2 READER) DEVI Use to check blood sugar three times daily. E10.8 1 each 0   Continuous Blood Gluc Sensor (FREESTYLE LIBRE 2 SENSOR) MISC Use to check blood sugar three times daily. Change sensor every 2 weeks. E10.8 2 each 3   FLUoxetine (PROZAC) 10 MG capsule Take 1 capsule (10 mg total) by mouth daily. 30 capsule 2   gabapentin (NEURONTIN) 300 MG capsule Take 1 capsule (300 mg total) by mouth daily. 30 capsule 0   glucose blood (ACCU-CHEK GUIDE) test strip Use to check blood sugar up to 6 times daily. 100 each 3   ibuprofen (ADVIL) 800 MG tablet Take 1 tablet  (800 mg total) by mouth every 8 (eight) hours as needed. (Patient taking differently: Take 800 mg by mouth every 8 (eight) hours as needed for mild pain.) 60 tablet 1   Insulin Glargine (BASAGLAR KWIKPEN) 100 UNIT/ML Inject 20 Units into the skin 2 (two) times daily. 15 mL 3   Insulin Pen Needle (B-D UF III MINI PEN NEEDLES) 31G X 5 MM MISC Insert into the skin as prescribed 90 each 1   Insulin Pen Needle 32G X 4 MM MISC Use with Humulin 70/30 kwikpen 100 each 0   simvastatin (ZOCOR) 20 MG tablet TAKE ONE TABLET BY MOUTH at bedtime (Patient not taking: Reported on 11/15/2022) 90 tablet 1   triamcinolone cream (KENALOG) 0.1 % Apply 1 Application topically 2 (two) times daily. Do not use for longer than 2 weeks in a row in any 1 location (Patient not taking: Reported on 11/15/2022) 80 g 0   No current facility-administered medications on file prior to visit.    Objective:   Vitals:   11/15/22 1519  BP: 121/73  Pulse: 72  Resp: 16  SpO2: 97%  Weight: 168 lb (76.2 kg)    Comprehensive ROS Pertinent positive and negative noted in HPI   Exam General appearance : Awake, alert, not in any distress. Speech Clear. Not toxic looking HEENT: Atraumatic and Normocephalic, pupils equally  reactive to light and accomodation Neck: Supple, no JVD. No cervical lymphadenopathy.  Chest: Good air entry bilaterally, no added sounds  CVS: S1 S2 regular, no murmurs.  Abdomen: Bowel sounds present, Non tender and not distended with no gaurding, rigidity or rebound. Extremities: B/L Lower Ext shows no edema, both legs are warm to touch Neurology: Awake alert, and oriented X 3, CN II-XII intact, Non focal Skin: No Rash  Data Review Lab Results  Component Value Date   HGBA1C 9.4 (A) 11/15/2022   HGBA1C 8.9 (H) 08/22/2022   HGBA1C 10.0 (H) 11/17/2021    Assessment & Plan  Diagnoses and all orders for this visit:  Type 1 DM with CKD stage 3 and hypertension (HCC) -     HgB A1c 9.4 -     Ambulatory  referral to Ophthalmology -     insulin aspart (NOVOLOG FLEXPEN) 100 UNIT/ML FlexPen; Inject 8 Units into the skin 3 (three) times daily with meals. -     Insulin Syringes, Disposable, U-100 0.3 ML MISC; Use to inject 8 units three times a day  Colon cancer screening -     Ambulatory referral to Gastroenterology  Comprehensive diabetic foot examination, type 1 DM, encounter for [facility]) COMPLETED     Thumb pain, left  S/P fall use his hands to break fall- left thumb swollen inflammatory process  ROM with pain ice   Patient have been counseled extensively about nutrition and exercise. Other issues discussed during this visit include: low cholesterol diet, weight control and daily exercise, foot care, annual eye examinations at Ophthalmology, importance of adherence with medications and regular follow-up. We also discussed long term complications of uncontrolled diabetes and hypertension.   Return in about 3 months (around 02/15/2023).  The patient was given clear instructions to go to ER or return to medical center if symptoms don't improve, worsen or new problems develop. The patient verbalized understanding. The patient was told to call to get lab results if they haven't heard anything in the next week.   This note has been created with Education officer, environmental. Any transcriptional errors are unintentional.   [name], NP 11/15/2022, 4:07 PM

## 2022-11-15 NOTE — Patient Instructions (Addendum)
Type 1 Diabetes Mellitus, Diagnosis, Adult  Type 1 diabetes mellitus, or type 1 diabetes, is a long-term (chronic) disease. It happens when the pancreas does not make enough of a substance called insulin. Insulin lets blood sugar (glucose) get into cells in your body. This gives you energy. If you do not have enough insulin, the blood sugar cannot get into the cells. This causes high blood sugar (hyperglycemia). There is no cure for type 1 diabetes. But you can manage it with treatment. What are the causes? The exact cause of type 1 diabetes is not known. What increases the risk? Having someone in your family with type 1 diabetes. Having a disease that makes your body's disease-fighting system (immune system) attack your body. Being around some kinds of germs (viruses). Living in a place that has cold weather. What are the signs or symptoms? More thirst or hunger than normal. Peeing more often than normal. Peeing more often at night. Sudden weight change. Weight change that you cannot explain. Feeling tired or weak. Seeing things blurry. Symptoms may begin slowly over days or weeks. They may also start all of a sudden. How is this treated? You may need to work with a diabetes expert. Your doctor may also tell you to: Take insulin every day. Take medicines to help prevent problems. Check your blood sugar often. Change your diet. You may need to have a food plan made for you by an expert (dietitian). Get regular exercise. Find ways to lower your stress. You will have goals for your treatment. These will be based on your age, health, and how you do with treatment. Your A1C hemoglobin (A1C) level should be less than 7%. Your blood sugar should be: 80-130 mg/dL (1.6-1.0 mmol/L) before meals. Below 180 mg/dL (10 mmol/L) after meals. Follow these instructions at home: Questions to ask your doctor Do I need to meet with a diabetes educator? Should I join a support group for people with  diabetes? What equipment will I need to care for myself at home? What medicines do I need? When should I take them? How often do I need to check my blood sugar? What number can I call if I have questions? When is my next doctor visit? General instructions Take over-the-counter and prescription medicines only as told by your doctor. Keep all follow-up visits. You will need to get blood tests from time to time to make sure treatment is working. Your doctor will change your medicines as needed. Where to find more information American Diabetes Association (ADA): diabetes.org Association of Diabetes Care and Education Specialists (ADCES): diabeteseducator.org International Diabetes Federation (IDF): http://hill.biz/ Contact a doctor if: Your blood sugar is higher than 240 mg/dL (96.0 mmol/L) for 2 days in a row. You have been sick for 2 days or more, and you are not getting better. You have had a fever for 2 days or more, and you are not getting better. For more than 6 hours, you: Cannot eat or drink. Feel like you may vomit. Vomit. Have watery poop (diarrhea). Get help right away if: Your blood sugar is less than 54 mg/dL (3 mmol/L). You get mixed up (confused). You cannot think clearly. You have trouble breathing. These symptoms may be an emergency. Get help right away. Call 911. Do not wait to see if the symptoms will go away. Do not drive yourself to the hospital. This information is not intended to replace advice given to you by your health care provider. Make sure you discuss any questions you  have with your health care provider. Document Revised: 02/26/2022 Document Reviewed: 02/26/2022 Elsevier Patient Education  2023 [employer].

## 2022-11-15 NOTE — Progress Notes (Signed)
Left swollen thumb x 2 weeks Ice and heat 7/10   Discuss omnipod for DM A1C 9.4

## 2022-11-15 NOTE — Telephone Encounter (Signed)
I spoke to the patient while he was with Gwinda Passe, NP at this appointment this afternoon.  He is not pleased with his current living situation but does have a place to stay.  He is unemployed at the moment but stated that he starts a new job at [employer] on Mon , 11/19/2022.  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   He has insurance for assistance with medication costs and medical care.  He has an upcoming appointment with endocrinology next month. He experiences fluctuations with his blood sugars and has frequent drops in blood sugar when he is not able to eat.  He receives food stamps but stated that others who live at his residence take his food.   He has transportation and we provided him with a listing of food pantries in [facility] as well as the May calendar for the [employer].

## 2022-11-16 ENCOUNTER — Other Ambulatory Visit (INDEPENDENT_AMBULATORY_CARE_PROVIDER_SITE_OTHER): Payer: Self-pay | Admitting: Primary Care

## 2022-11-16 ENCOUNTER — Telehealth (INDEPENDENT_AMBULATORY_CARE_PROVIDER_SITE_OTHER): Payer: Self-pay | Admitting: Primary Care

## 2022-11-16 ENCOUNTER — Other Ambulatory Visit: Payer: Self-pay

## 2022-11-16 ENCOUNTER — Other Ambulatory Visit: Payer: Self-pay | Admitting: Pharmacist

## 2022-11-16 DIAGNOSIS — N183 Chronic kidney disease, stage 3 unspecified: Secondary | ICD-10-CM

## 2022-11-16 MED ORDER — LANTUS SOLOSTAR 100 UNIT/ML ~~LOC~~ SOPN: 20.0000 [IU] | PEN_INJECTOR | Freq: Two times a day (BID) | SUBCUTANEOUS | 0 refills | Status: DC

## 2022-11-16 NOTE — Telephone Encounter (Signed)
Pharmacy states he has 2 insurances.  Medicaid is secondary and it does not cover Lantus. His primary will not cover Basaglar.  He needs a PA for [employer].  Please advise.   Please advise

## 2022-11-16 NOTE — Telephone Encounter (Signed)
Unable to refill per protocol, Rx request is too soon. Last refill 11/06/22, duplicate request.  Requested Prescriptions  Pending Prescriptions Disp Refills   LANTUS SOLOSTAR 100 UNIT/ML Solostar Pen [Pharmacy Med Name: Lantus Solostar U-100 Insulin 100 unit/mL (3 mL) subcutaneous pen] 15 mL 0    Sig: inject SUBCUTANEOUSLY 20 units TWICE DAILY AS DIRECTED     Endocrinology:  Diabetes - Insulins Failed - 11/16/2022  1:24 PM      Failed - HBA1C is between 0 and 7.9 and within 180 days    HbA1c, POC (controlled diabetic range)  Date Value Ref Range Status  11/15/2022 9.4 (A) 0.0 - 7.0 % Final         Passed - Valid encounter within last 6 months    Recent Outpatient Visits           Yesterday Type 1 DM with CKD stage 3 and hypertension (HCC)   [place] [facility] [name], NP   1 month ago Type 1 DM with CKD stage 3 and hypertension (HCC)   [place] [facility] [name], NP   11 months ago Essential hypertension, benign   [place] [facility] [place], [place] W, NP   12 months ago DM (diabetes mellitus), type 1 with complications [facility])   [place] [facility] [place], Shea Stakes, NP   1 year ago Essential hypertension   [place] [facility] [place], Cornelius Moras, RPH-CPP       Future Appointments             In 3 weeks Shamleffer, Konrad Dolores, MD [facility] Endocrinology   In 1 month Randa Evens, Kinnie Scales, NP [place] [facility]

## 2022-11-16 NOTE — Telephone Encounter (Signed)
Pt called in states can't afford the med, basaglar. Please cb for further assistance.

## 2022-11-17 ENCOUNTER — Other Ambulatory Visit: Payer: Self-pay

## 2022-11-17 ENCOUNTER — Inpatient Hospital Stay (HOSPITAL_COMMUNITY)
Admission: EM | Admit: 2022-11-17 | Discharge: 2022-11-19 | DRG: 638 | Disposition: A | Discharge status: Home / Self Care | Payer: 59 | Attending: Internal Medicine | Admitting: Internal Medicine

## 2022-11-17 ENCOUNTER — Encounter (HOSPITAL_COMMUNITY): Payer: Self-pay | Admitting: *Deleted

## 2022-11-17 DIAGNOSIS — R7989 Other specified abnormal findings of blood chemistry: Secondary | ICD-10-CM

## 2022-11-17 DIAGNOSIS — Z888 Allergy status to other drugs, medicaments and biological substances status: Secondary | ICD-10-CM

## 2022-11-17 DIAGNOSIS — N183 Chronic kidney disease, stage 3 unspecified: Secondary | ICD-10-CM

## 2022-11-17 DIAGNOSIS — Z597 Insufficient social insurance and welfare support: Secondary | ICD-10-CM

## 2022-11-17 DIAGNOSIS — Z79899 Other long term (current) drug therapy: Secondary | ICD-10-CM

## 2022-11-17 DIAGNOSIS — E860 Dehydration: Secondary | ICD-10-CM | POA: Diagnosis present

## 2022-11-17 DIAGNOSIS — N1831 Chronic kidney disease, stage 3a: Secondary | ICD-10-CM | POA: Diagnosis present

## 2022-11-17 DIAGNOSIS — N179 Acute kidney failure, unspecified: Secondary | ICD-10-CM

## 2022-11-17 DIAGNOSIS — I129 Hypertensive chronic kidney disease with stage 1 through stage 4 chronic kidney disease, or unspecified chronic kidney disease: Secondary | ICD-10-CM | POA: Diagnosis present

## 2022-11-17 DIAGNOSIS — N189 Chronic kidney disease, unspecified: Secondary | ICD-10-CM

## 2022-11-17 DIAGNOSIS — M79645 Pain in left finger(s): Secondary | ICD-10-CM | POA: Diagnosis present

## 2022-11-17 DIAGNOSIS — Z833 Family history of diabetes mellitus: Secondary | ICD-10-CM

## 2022-11-17 DIAGNOSIS — T383X6A Underdosing of insulin and oral hypoglycemic [antidiabetic] drugs, initial encounter: Secondary | ICD-10-CM | POA: Diagnosis present

## 2022-11-17 DIAGNOSIS — Z8249 Family history of ischemic heart disease and other diseases of the circulatory system: Secondary | ICD-10-CM

## 2022-11-17 DIAGNOSIS — W19XXXA Unspecified fall, initial encounter: Secondary | ICD-10-CM | POA: Diagnosis present

## 2022-11-17 DIAGNOSIS — E785 Hyperlipidemia, unspecified: Secondary | ICD-10-CM | POA: Diagnosis present

## 2022-11-17 DIAGNOSIS — I160 Hypertensive urgency: Secondary | ICD-10-CM

## 2022-11-17 DIAGNOSIS — Z794 Long term (current) use of insulin: Secondary | ICD-10-CM

## 2022-11-17 DIAGNOSIS — E1022 Type 1 diabetes mellitus with diabetic chronic kidney disease: Secondary | ICD-10-CM | POA: Diagnosis present

## 2022-11-17 DIAGNOSIS — E875 Hyperkalemia: Secondary | ICD-10-CM

## 2022-11-17 DIAGNOSIS — E1010 Type 1 diabetes mellitus with ketoacidosis without coma: Principal | ICD-10-CM | POA: Diagnosis present

## 2022-11-17 DIAGNOSIS — S6992XA Unspecified injury of left wrist, hand and finger(s), initial encounter: Secondary | ICD-10-CM | POA: Diagnosis present

## 2022-11-17 DIAGNOSIS — Z560 Unemployment, unspecified: Secondary | ICD-10-CM

## 2022-11-17 DIAGNOSIS — Z91141 Patient's other noncompliance with medication regimen due to financial hardship: Secondary | ICD-10-CM

## 2022-11-17 LAB — BASIC METABOLIC PANEL
Anion gap: 8 (ref 5–15)
BUN: 31 mg/dL — ABNORMAL HIGH (ref 6–20)
CO2: 25 mmol/L (ref 22–32)
Calcium: 9.1 mg/dL (ref 8.9–10.3)
Chloride: 96 mmol/L — ABNORMAL LOW (ref 98–111)
Creatinine, Ser: 1.96 mg/dL — ABNORMAL HIGH (ref 0.61–1.24)
GFR, Estimated: 42 mL/min — ABNORMAL LOW (ref >60)
Glucose, Bld: 375 mg/dL — ABNORMAL HIGH (ref 70–99)
Potassium: 5.3 mmol/L — ABNORMAL HIGH (ref 3.5–5.1)
Sodium: 129 mmol/L — ABNORMAL LOW (ref 135–145)

## 2022-11-17 LAB — COMPREHENSIVE METABOLIC PANEL
ALT: 22 U/L (ref 0–44)
AST: 25 U/L (ref 15–41)
Albumin: 3.6 g/dL (ref 3.5–5.0)
Alkaline Phosphatase: 63 U/L (ref 38–126)
Anion gap: 12 (ref 5–15)
BUN: 31 mg/dL — ABNORMAL HIGH (ref 6–20)
CO2: 21 mmol/L — ABNORMAL LOW (ref 22–32)
Calcium: 9.4 mg/dL (ref 8.9–10.3)
Chloride: 96 mmol/L — ABNORMAL LOW (ref 98–111)
Creatinine, Ser: 1.93 mg/dL — ABNORMAL HIGH (ref 0.61–1.24)
GFR, Estimated: 43 mL/min — ABNORMAL LOW (ref >60)
Glucose, Bld: 390 mg/dL — ABNORMAL HIGH (ref 70–99)
Potassium: 5.6 mmol/L — ABNORMAL HIGH (ref 3.5–5.1)
Sodium: 129 mmol/L — ABNORMAL LOW (ref 135–145)
Total Bilirubin: 1.3 mg/dL — ABNORMAL HIGH (ref 0.3–1.2)
Total Protein: 6.9 g/dL (ref 6.5–8.1)

## 2022-11-17 LAB — URINALYSIS, ROUTINE W REFLEX MICROSCOPIC
Bacteria, UA: NONE SEEN
Bilirubin Urine: NEGATIVE
Glucose, UA: >=500 mg/dL — AB
Ketones, ur: 5 mg/dL — AB
Leukocytes,Ua: NEGATIVE
Nitrite: NEGATIVE
Protein, ur: >=300 mg/dL — AB
Specific Gravity, Urine: 1.01 (ref 1.005–1.030)
pH: 5 (ref 5.0–8.0)

## 2022-11-17 LAB — I-STAT VENOUS BLOOD GAS, ED
Acid-base deficit: 2 mmol/L (ref 0.0–2.0)
Bicarbonate: 25.9 mmol/L (ref 20.0–28.0)
Calcium, Ion: 1.22 mmol/L (ref 1.15–1.40)
HCT: 47 % (ref 39.0–52.0)
Hemoglobin: 16 g/dL (ref 13.0–17.0)
O2 Saturation: 71 %
Potassium: 5.6 mmol/L — ABNORMAL HIGH (ref 3.5–5.1)
Sodium: 129 mmol/L — ABNORMAL LOW (ref 135–145)
TCO2: 28 mmol/L (ref 22–32)
pCO2, Ven: 55 mmHg (ref 44–60)
pH, Ven: 7.281 (ref 7.25–7.43)
pO2, Ven: 42 mmHg (ref 32–45)

## 2022-11-17 LAB — CBG MONITORING, ED
Glucose-Capillary: 347 mg/dL — ABNORMAL HIGH (ref 70–99)
Glucose-Capillary: 354 mg/dL — ABNORMAL HIGH (ref 70–99)
Glucose-Capillary: 352 mg/dL — ABNORMAL HIGH (ref 70–99)

## 2022-11-17 LAB — CBC
HCT: 46.2 % (ref 39.0–52.0)
Hemoglobin: 15.2 g/dL (ref 13.0–17.0)
MCH: 27.9 pg (ref 26.0–34.0)
MCHC: 32.9 g/dL (ref 30.0–36.0)
MCV: 84.9 fL (ref 80.0–100.0)
Platelets: 262 10*3/uL (ref 150–400)
RBC: 5.44 MIL/uL (ref 4.22–5.81)
RDW: 12.1 % (ref 11.5–15.5)
WBC: 6 10*3/uL (ref 4.0–10.5)
nRBC: 0 % (ref 0.0–0.2)

## 2022-11-17 LAB — GLUCOSE, CAPILLARY: Glucose-Capillary: 338 mg/dL — ABNORMAL HIGH (ref 70–99)

## 2022-11-17 LAB — BETA-HYDROXYBUTYRIC ACID: Beta-Hydroxybutyric Acid: 1.91 mmol/L — ABNORMAL HIGH (ref 0.05–0.27)

## 2022-11-17 MED ORDER — DEXTROSE IN LACTATED RINGERS 5 % IV SOLN: INTRAVENOUS | Status: DC

## 2022-11-17 MED ORDER — HYDRALAZINE HCL 20 MG/ML IJ SOLN
5.0000 mg | INTRAMUSCULAR | Status: DC | PRN
  Administered 2022-11-17 – 2022-11-19 (×2): 5 mg via INTRAVENOUS
  Filled 2022-11-17 (×2): qty 1

## 2022-11-17 MED ORDER — GABAPENTIN 300 MG PO CAPS: 300.0000 mg | ORAL_CAPSULE | Freq: Every day | ORAL | Status: DC

## 2022-11-17 MED ORDER — INSULIN GLARGINE-YFGN 100 UNIT/ML ~~LOC~~ SOLN
12.0000 [IU] | Freq: Once | SUBCUTANEOUS | Status: AC
  Administered 2022-11-17: 12 [IU] via SUBCUTANEOUS
  Filled 2022-11-17: qty 0.12

## 2022-11-17 MED ORDER — INSULIN REGULAR(HUMAN) IN NACL 100-0.9 UT/100ML-% IV SOLN
INTRAVENOUS | Status: DC
  Filled 2022-11-17: qty 100

## 2022-11-17 MED ORDER — LANTUS SOLOSTAR 100 UNIT/ML ~~LOC~~ SOPN: 20.0000 [IU] | PEN_INJECTOR | Freq: Two times a day (BID) | SUBCUTANEOUS | 0 refills | Status: DC

## 2022-11-17 MED ORDER — ENOXAPARIN SODIUM 40 MG/0.4ML IJ SOSY
40.0000 mg | PREFILLED_SYRINGE | INTRAMUSCULAR | Status: DC
  Administered 2022-11-18 – 2022-11-19 (×2): 40 mg via SUBCUTANEOUS
  Filled 2022-11-17 (×2): qty 0.4

## 2022-11-17 MED ORDER — HYDRALAZINE HCL 20 MG/ML IJ SOLN: 5.0000 mg | Freq: Four times a day (QID) | INTRAMUSCULAR | Status: DC | PRN

## 2022-11-17 MED ORDER — LACTATED RINGERS IV BOLUS
20.0000 mL/kg | Freq: Once | INTRAVENOUS | Status: AC
  Administered 2022-11-17: 1524 mL via INTRAVENOUS

## 2022-11-17 MED ORDER — INSULIN REGULAR(HUMAN) IN NACL 100-0.9 UT/100ML-% IV SOLN
INTRAVENOUS | Status: DC
  Administered 2022-11-17: 11.5 [IU]/h via INTRAVENOUS
  Filled 2022-11-17: qty 100

## 2022-11-17 MED ORDER — LACTATED RINGERS IV BOLUS
1000.0000 mL | Freq: Once | INTRAVENOUS | Status: AC
  Administered 2022-11-17: 1000 mL via INTRAVENOUS

## 2022-11-17 MED ORDER — DEXTROSE 50 % IV SOLN: 0.0000 mL | INTRAVENOUS | Status: DC | PRN

## 2022-11-17 MED ORDER — ACETAMINOPHEN 325 MG PO TABS: 650.0000 mg | ORAL_TABLET | Freq: Four times a day (QID) | ORAL | Status: DC | PRN

## 2022-11-17 MED ORDER — DEXTROSE 50 % IV SOLN
0.0000 mL | INTRAVENOUS | Status: DC | PRN
  Administered 2022-11-18: 35 mL via INTRAVENOUS
  Filled 2022-11-17: qty 50

## 2022-11-17 MED ORDER — SODIUM CHLORIDE 0.9 % IV SOLN: INTRAVENOUS | Status: DC

## 2022-11-17 MED ORDER — DEXTROSE-NACL 5-0.45 % IV SOLN: INTRAVENOUS | Status: DC

## 2022-11-17 MED ORDER — LACTATED RINGERS IV SOLN: INTRAVENOUS | Status: DC

## 2022-11-17 MED ORDER — ACETAMINOPHEN 650 MG RE SUPP: 650.0000 mg | Freq: Four times a day (QID) | RECTAL | Status: DC | PRN

## 2022-11-17 NOTE — ED Notes (Signed)
ED TO INPATIENT HANDOFF REPORT  ED Nurse Name and Phone #: Grant Fontana Pm [phone]  S Name/Age/Gender [name] 47 y.o. male Room/Bed: 007C/007C  Code Status   Code Status: Prior  Home/SNF/Other Home Patient oriented to: self, time, situation and place Is this baseline? Yes   Triage Complete: Triage complete  Chief Complaint DKA, type 1 (HCC) [E10.10]  Triage Note The pt is a diabetic and he is on 2 types of insulin  he took his last regular insulin 2 days ago.  He has lost his insurance and cannot afford the meds any more  he also has a pain in his lt hand and wrist he injured 2 weeks ago  he was xrayed ay urgent care a week ago no fracture   Allergies Allergies  Allergen Reactions   Geodon [Ziprasidone Hcl] Other (See Comments)    Severe muscle spasms (ENTIRE BODY)- had to call EMS   Amlodipine Hives and Swelling    Lower extremities    Level of Care/Admitting Diagnosis ED Disposition     ED Disposition  Admit   Condition  --   Comment  Hospital Area: [facility] [[id number]]  Level of Care: Progressive [102]  Admit to Progressive based on following criteria: GI, ENDOCRINE disease patients with GI bleeding, acute liver failure or pancreatitis, stable with diabetic ketoacidosis or thyrotoxicosis (hypothyroid) state.  May place patient in observation at [facility] or [facility] if equivalent level of care is available:: Yes  Covid Evaluation: Asymptomatic - no recent exposure (last 10 days) testing not required  Diagnosis: DKA, type 1 [facility]) [[id number]]  Admitting Physician: John Giovanni [[id number]]  Attending Physician: John Giovanni [[id number]]          B Medical/Surgery History Past Medical History:  Diagnosis Date   Diabetes mellitus    Hypertension    Seizures (HCC)    Past Surgical History:  Procedure Laterality Date   HERNIA REPAIR       A IV Location/Drains/Wounds Patient Lines/Drains/Airways Status     Active  Line/Drains/Airways     Name Placement date Placement time Site Days   Peripheral IV 11/17/22 20 G 1.88" Anterior;Right Forearm 11/17/22  2143  Forearm  less than 1            Intake/Output Last 24 hours No intake or output data in the 24 hours ending 11/17/22 2205  Labs/Imaging Results for orders placed or performed during the hospital encounter of 11/17/22 (from the past 48 hour(s))  Comprehensive metabolic panel     Status: Abnormal   Collection Time: 11/17/22  4:43 PM  Result Value Ref Range   Sodium 129 (L) 135 - 145 mmol/L   Potassium 5.6 (H) 3.5 - 5.1 mmol/L   Chloride 96 (L) 98 - 111 mmol/L   CO2 21 (L) 22 - 32 mmol/L   Glucose, Bld 390 (H) 70 - 99 mg/dL    Comment: Glucose reference range applies only to samples taken after fasting for at least 8 hours.   BUN 31 (H) 6 - 20 mg/dL   Creatinine, Ser 0.86 (H) 0.61 - 1.24 mg/dL   Calcium 9.4 8.9 - 57.8 mg/dL   Total Protein 6.9 6.5 - 8.1 g/dL   Albumin 3.6 3.5 - 5.0 g/dL   AST 25 15 - 41 U/L   ALT 22 0 - 44 U/L   Alkaline Phosphatase 63 38 - 126 U/L   Total Bilirubin 1.3 (H) 0.3 - 1.2 mg/dL   GFR,  Estimated 43 (L) >60 mL/min    Comment: (NOTE) Calculated using the CKD-EPI Creatinine Equation (2021)    Anion gap 12 5 - 15    Comment: Performed at [facility], [address] [place], [place] [address]  CBC     Status: None   Collection Time: 11/17/22  4:43 PM  Result Value Ref Range   WBC 6.0 4.0 - 10.5 K/uL   RBC 5.44 4.22 - 5.81 MIL/uL   Hemoglobin 15.2 13.0 - 17.0 g/dL   HCT 64.4 03.4 - 74.2 %   MCV 84.9 80.0 - 100.0 fL   MCH 27.9 26.0 - 34.0 pg   MCHC 32.9 30.0 - 36.0 g/dL   RDW 59.5 63.8 - 75.6 %   Platelets 262 150 - 400 K/uL   nRBC 0.0 0.0 - 0.2 %    Comment: Performed at [facility], [address], [place], [place] [address]  POC CBG, ED     Status: Abnormal   Collection Time: 11/17/22  5:03 PM  Result Value Ref Range   Glucose-Capillary 347 (H) 70 - 99 mg/dL    Comment: Glucose  reference range applies only to samples taken after fasting for at least 8 hours.  Beta-hydroxybutyric acid     Status: Abnormal   Collection Time: 11/17/22  5:30 PM  Result Value Ref Range   Beta-Hydroxybutyric Acid 1.91 (H) 0.05 - 0.27 mmol/L    Comment: Performed at [facility], [address] [place], [place] [address]  I-Stat venous blood gas, [facility], [facility], [facility])     Status: Abnormal   Collection Time: 11/17/22  6:50 PM  Result Value Ref Range   pH, Ven 7.281 7.25 - 7.43   pCO2, Ven 55.0 44 - 60 mmHg   pO2, Ven 42 32 - 45 mmHg   Bicarbonate 25.9 20.0 - 28.0 mmol/L   TCO2 28 22 - 32 mmol/L   O2 Saturation 71 %   Acid-base deficit 2.0 0.0 - 2.0 mmol/L   Sodium 129 (L) 135 - 145 mmol/L   Potassium 5.6 (H) 3.5 - 5.1 mmol/L   Calcium, Ion 1.22 1.15 - 1.40 mmol/L   HCT 47.0 39.0 - 52.0 %   Hemoglobin 16.0 13.0 - 17.0 g/dL   Sample type VENOUS   POC CBG, ED     Status: Abnormal   Collection Time: 11/17/22  8:38 PM  Result Value Ref Range   Glucose-Capillary 354 (H) 70 - 99 mg/dL    Comment: Glucose reference range applies only to samples taken after fasting for at least 8 hours.  Urinalysis, Routine w reflex microscopic -Urine, Clean Catch     Status: Abnormal   Collection Time: 11/17/22  8:40 PM  Result Value Ref Range   Color, Urine YELLOW YELLOW   APPearance CLEAR CLEAR   Specific Gravity, Urine 1.010 1.005 - 1.030   pH 5.0 5.0 - 8.0   Glucose, UA >=500 (A) NEGATIVE mg/dL   Hgb urine dipstick SMALL (A) NEGATIVE   Bilirubin Urine NEGATIVE NEGATIVE   Ketones, ur 5 (A) NEGATIVE mg/dL   Protein, ur >=166 (A) NEGATIVE mg/dL   Nitrite NEGATIVE NEGATIVE   Leukocytes,Ua NEGATIVE NEGATIVE   RBC / HPF 0-5 0 - 5 RBC/hpf   WBC, UA 0-5 0 - 5 WBC/hpf   Bacteria, UA NONE SEEN NONE SEEN   Squamous Epithelial / HPF 0-5 0 - 5 /HPF   Hyaline Casts, UA PRESENT     Comment: Performed  at [facility], [address] [place], [place] [address]  CBG monitoring, ED     Status:  Abnormal   Collection Time: 11/17/22  9:50 PM  Result Value Ref Range   Glucose-Capillary 352 (H) 70 - 99 mg/dL    Comment: Glucose reference range applies only to samples taken after fasting for at least 8 hours.   No results found.  Pending Labs [employer] (From admission, onward)     Start     Ordered   11/17/22 2114  Basic metabolic panel  (Diabetes Ketoacidosis (DKA))  STAT Now then every 4 hours ,   STAT      11/17/22 2114            Vitals/Pain Today's Vitals   11/17/22 2000 11/17/22 2030 11/17/22 2041 11/17/22 2154  BP: (!) 175/95 (!) 168/89    Pulse: 60 (!) 59    Resp: 18 17    Temp:   98.2 F (36.8 C) 98 F (36.7 C)  TempSrc:   Oral Oral  SpO2: 100% 100%    Weight:      Height:      PainSc:        Isolation Precautions No active isolations  Medications Medications  insulin regular, human (MYXREDLIN) 100 units/ 100 mL infusion (has no administration in time range)  lactated ringers infusion (has no administration in time range)  dextrose 5 % in lactated ringers infusion (has no administration in time range)  dextrose 50 % solution 0-50 mL (has no administration in time range)  insulin glargine-yfgn (SEMGLEE) injection 12 Units (12 Units Subcutaneous Given 11/17/22 1926)  lactated ringers bolus 1,000 mL (1,000 mLs Intravenous New Bag/Given 11/17/22 2158)  lactated ringers bolus 1,524 mL (1,524 mLs Intravenous New Bag/Given 11/17/22 2200)    Mobility walks     Focused Assessments Hyperglycemia with Insulin drip   R Recommendations: See Admitting Provider Note  Report given to:   Additional Notes:

## 2022-11-17 NOTE — ED Notes (Signed)
Due to difficult access, patient has only one IV. I inquired with Micromedex to see if LR and Insulin were compatible, it showed "no results". I called main Pharmacy and they advised not to run LR with insulin. I elected to give LR bolus first, then initiate insulin drip.

## 2022-11-17 NOTE — ED Triage Notes (Signed)
The pt is a diabetic and he is on 2 types of insulin  he took his last regular insulin 2 days ago.  He has lost his insurance and cannot afford the meds any more  he also has a pain in his lt hand and wrist he injured 2 weeks ago  he was xrayed ay urgent care a week ago no fracture

## 2022-11-17 NOTE — ED Notes (Signed)
IV attempted with [place] by this RN x1, unsuccessful. IV team now to place, at bedside currently

## 2022-11-17 NOTE — ED Provider Notes (Signed)
[place] EMERGENCY DEPARTMENT AT [facility] Provider Note   CSN: [id number] Arrival date & time: 11/17/22  1619     History Chief Complaint  Patient presents with   out of meds    [name] is a 47 y.o. male with Type 1 Diabetes, CKD stage 3, hypertension presents the emergency room today for evaluation of elevated blood sugar.  Patient reports has been out of his long-acting insulin for the past 2 days.  His last dose to himself was over 2 days ago and was only 6 units.  He reports he is having some trouble with his insurance and that he lost his job but his insurance was able to fill his long-acting insulin.  He does have a few doses of the short acting left.  He denies any chest pain, shortness of breath, abdominal pain, nausea, vomiting, fever, recent illness.  Does report he just feels fatigued which she knows his sugars being elevated.  He was recently seen at his PCP office for refills of his insulin.  Unfortunately, his pharmacy is also closed the weekend and he is unsure of his PCP office is able to clear up while his insurance was not approving the long-acting insulin.  Additionally, the patient complaining of pain to his left thumb that he has had for the past 1 to 2 weeks after falling.  Reports the swelling is still persistent.  Does not want me to x-ray.  HPI     Home Medications Prior to Admission medications   Medication Sig Start Date End Date Taking? Authorizing Provider  Accu-Chek Softclix Lancets lancets Use to check blood sugar up to 6 times daily. 11/06/22   [name], NP  Blood Glucose Monitoring Suppl (ACCU-CHEK GUIDE) w/Device KIT Use to check blood sugar up to 6 times daily. 11/06/22   [name], NP  Blood Pressure Monitoring ([employer] ADVANCED BP MONITOR) DEVI 1 Device by Does not apply route daily. 03/20/22   Waldon Merl, PA-C  Continuous Blood Gluc Receiver (FREESTYLE LIBRE 2 READER) DEVI Use to check blood sugar three times  daily. E10.8 03/07/22   Claiborne Rigg, NP  Continuous Blood Gluc Sensor (FREESTYLE LIBRE 2 SENSOR) MISC Use to check blood sugar three times daily. Change sensor every 2 weeks. E10.8 03/07/22   Hoy Register, MD  FLUoxetine (PROZAC) 10 MG capsule Take 1 capsule (10 mg total) by mouth daily. 02/02/22 02/02/23  Ajibola, Gerrianne Scale A, NP  gabapentin (NEURONTIN) 300 MG capsule Take 1 capsule (300 mg total) by mouth daily. 08/25/22   Lyndle Herrlich, MD  glucose blood (ACCU-CHEK GUIDE) test strip Use to check blood sugar up to 6 times daily. 10/04/22   Hoy Register, MD  ibuprofen (ADVIL) 800 MG tablet Take 1 tablet (800 mg total) by mouth every 8 (eight) hours as needed. Patient taking differently: Take 800 mg by mouth every 8 (eight) hours as needed for mild pain. 02/22/21   Claiborne Rigg, NP  insulin aspart (NOVOLOG FLEXPEN) 100 UNIT/ML FlexPen Inject 8 Units into the skin 3 (three) times daily with meals. 11/15/22   [name], NP  insulin glargine (LANTUS SOLOSTAR) 100 UNIT/ML Solostar Pen Inject 20 Units into the skin 2 (two) times daily. 11/17/22   [name], PA-C  Insulin Pen Needle (B-D UF III MINI PEN NEEDLES) 31G X 5 MM MISC Insert into the skin as prescribed 07/26/21   [name], PA-C  Insulin Pen Needle 32G X 4 MM  MISC Use with Humulin 70/30 kwikpen 10/01/22   [name], NP  Insulin Syringes, Disposable, U-100 0.3 ML MISC Use to inject 8 units three times a day 11/15/22   [name], NP  lisinopril-hydrochlorothiazide (ZESTORETIC) 20-25 MG tablet Take 1 tablet by mouth daily. 11/06/22   [name], NP  simvastatin (ZOCOR) 20 MG tablet TAKE ONE TABLET BY MOUTH at bedtime Patient not taking: Reported on 11/15/2022 10/11/21   Hoy Register, MD  triamcinolone cream (KENALOG) 0.1 % Apply 1 Application topically 2 (two) times daily. Do not use for longer than 2 weeks in a row in any 1 location Patient not taking: Reported on 11/15/2022 06/22/22   Prosperi,  Christian H, PA-C      Allergies    Geodon [ziprasidone hcl], Ziprasidone mesylate, and Amlodipine    Review of Systems   Review of Systems  Constitutional:  Positive for fatigue. Negative for chills and fever.  Respiratory:  Negative for shortness of breath.   Cardiovascular:  Negative for chest pain.  Gastrointestinal:  Negative for abdominal pain, constipation, diarrhea, nausea and vomiting.  Genitourinary:  Negative for dysuria and hematuria.    Physical Exam Updated Vital Signs BP (!) 168/89   Pulse (!) 59   Temp 98.2 F (36.8 C) (Oral)   Resp 17   Ht 5\' 10"  (1.778 m)   Wt 76.2 kg   SpO2 100%   BMI 24.10 kg/m  Physical Exam Vitals and nursing note reviewed.  Constitutional:      General: He is not in acute distress.    Appearance: Normal appearance. He is not toxic-appearing.  Eyes:     General: No scleral icterus. Cardiovascular:     Rate and Rhythm: Normal rate.  Pulmonary:     Effort: Pulmonary effort is normal. No respiratory distress.     Breath sounds: Normal breath sounds.  Abdominal:     Palpations: Abdomen is soft.     Tenderness: There is no abdominal tenderness. There is no guarding or rebound.  Musculoskeletal:     Comments: Left Hand -pain on AB and adduction of the thumb.  Some swelling noted but no overlying erythema.  No proximal tenderness.  Palpable radial pulse.  Cap refill brisk.  Patient can still flex and extend thumb as well but does have some pain.  Skin:    General: Skin is dry.     Findings: No rash.  Neurological:     General: No focal deficit present.     Mental Status: He is alert. Mental status is at baseline.  Psychiatric:        Mood and Affect: Mood normal.     ED Results / Procedures / Treatments   Labs (all labs ordered are listed, but only abnormal results are displayed) Labs Reviewed  COMPREHENSIVE METABOLIC PANEL - Abnormal; Notable for the following components:      Result Value   Sodium 129 (*)    Potassium 5.6  (*)    Chloride 96 (*)    CO2 21 (*)    Glucose, Bld 390 (*)    BUN 31 (*)    Creatinine, Ser 1.93 (*)    Total Bilirubin 1.3 (*)    GFR, Estimated 43 (*)    All other components within normal limits  URINALYSIS, ROUTINE W REFLEX MICROSCOPIC - Abnormal; Notable for the following components:   Glucose, UA >=500 (*)    Hgb urine dipstick SMALL (*)    Ketones, ur 5 (*)  Protein, ur >=300 (*)    All other components within normal limits  BETA-HYDROXYBUTYRIC ACID - Abnormal; Notable for the following components:   Beta-Hydroxybutyric Acid 1.91 (*)    All other components within normal limits  CBG MONITORING, ED - Abnormal; Notable for the following components:   Glucose-Capillary 347 (*)    All other components within normal limits  I-STAT VENOUS BLOOD GAS, ED - Abnormal; Notable for the following components:   Sodium 129 (*)    Potassium 5.6 (*)    All other components within normal limits  CBG MONITORING, ED - Abnormal; Notable for the following components:   Glucose-Capillary 354 (*)    All other components within normal limits  CBC  BASIC METABOLIC PANEL  BASIC METABOLIC PANEL  BASIC METABOLIC PANEL  BASIC METABOLIC PANEL  CBG MONITORING, ED    EKG None  Radiology No results found.  Procedures .Critical Care  Performed by: [name], PA-C Authorized by: [name], PA-C   Critical care provider statement:    Critical care time (minutes):  30   Critical care was necessary to treat or prevent imminent or life-threatening deterioration of the following conditions:  Endocrine crisis (DKA)   Critical care was time spent personally by me on the following activities:  Development of treatment plan with patient or surrogate, discussions with consultants, evaluation of patient's response to treatment, examination of patient, ordering and review of laboratory studies, ordering and review of radiographic studies, ordering and performing treatments and interventions,  pulse oximetry, re-evaluation of patient's condition, review of old charts and obtaining history from patient or surrogate   Care discussed with: admitting provider      Medications Ordered in ED Medications  lactated ringers bolus 1,000 mL (has no administration in time range)  lactated ringers bolus 1,524 mL (has no administration in time range)  insulin regular, human (MYXREDLIN) 100 units/ 100 mL infusion (has no administration in time range)  lactated ringers infusion (has no administration in time range)  dextrose 5 % in lactated ringers infusion (has no administration in time range)  dextrose 50 % solution 0-50 mL (has no administration in time range)  insulin glargine-yfgn (SEMGLEE) injection 12 Units (12 Units Subcutaneous Given 11/17/22 1926)    ED Course/ Medical Decision Making/ A&P                            Medical Decision Making Amount and/or Complexity of Data Reviewed Labs: ordered.  Risk Prescription drug management.   47 y.o. male presents to the ER for evaluation of elevated blood sugar. Differential diagnosis includes but is not limited to DKA versus hyperglycemia versus HHS. Vital signs mildly elevated blood pressure, mild tachycardia, otherwise afebrile, satting well room air without increased work of breathing. Physical exam as noted above.   I ordered 12 units of the patient's long-acting insulin as well as some IV fluids.  Patient was difficult IV access.  IV consultation was placed.  I independently reviewed and interpreted the patient's labs.  I-STAT VBG does show lower pH is 7.28.  Beta-hydroxybutyrate acid is also elevated at 1.91.  CBC with leukocytosis or anemia.  CMP does show pseudohyponatremia at 129 is his glucose is at 390.  He does have an elevated creatinine but it is at his baseline.  Elevated potassium at 5.6.  Chloride and bicarb slightly low however patient is an anion gap of 12.  Total bilirubin also slightly elevated at 1.3.  Urinalysis  shows greater than 500 glucose as well as small but hemoglobin.  There is 5 ketones present as well as greater than 300 protein.  After the units of insulin, his blood sugar only improved to 354.  Given the pH of less than 7.3, presence of ketones, decreased bicarb, and elevated sugar, will order DKA order set.  Hyperkalemia should resolve with some of the insulin, will keep monitoring with repeat BMPs.  DKA order set with fluids and insulin ordered.  I discussed the labs with patient and need for admission.  Patient is agreeable. Will admit to hospitalist.   I discussed this case with my attending physician who cosigned this note including patient's presenting symptoms, physical exam, and planned diagnostics and interventions. Attending physician stated agreement with plan or made changes to plan which were implemented.   Final Clinical Impression(s) / ED Diagnoses Final diagnoses:  Type 1 diabetes mellitus with ketoacidosis without coma (HCC)  Chronic kidney disease, unspecified CKD stage  Pseudohyponatremia    Rx / DC Orders ED Discharge Orders          Ordered    insulin glargine (LANTUS SOLOSTAR) 100 UNIT/ML Solostar Pen  2 times daily       Note to Pharmacy: Must have office visit for more refills.   11/17/22 2044              [name], PA-C 11/17/22 2133    Gloris Manchester, MD 11/17/22 [id number]

## 2022-11-17 NOTE — H&P (Signed)
History and Physical    [name] VWU:[id number] DOB: 08/05/1975 DOA: 11/17/2022  PCP: [name], NP  Patient coming from: Home  Chief Complaint: Ran out of insulin  HPI: Donald KRITIKOS is a 47 y.o. male with medical history significant of type 1 diabetes, hypertension, CKD stage IIIa, hyperlipidemia, seizures came into the ED as he ran out of his home long-acting insulin 2 days ago.  Hypertensive but vital signs otherwise stable.  Labs showing WBC 6.0, hemoglobin 15.2, platelet count 262k, sodium 129, potassium 5.6, chloride 96, bicarb 21, anion gap 12, BUN 31, creatinine 1.9, glucose 390, beta hydroxybutyric acid 1.91, VBG with pH 7.28, UA with 5 ketones.  Patient received Semglee 20 units in the ED and was subsequently started on IV insulin and IV fluids per DKA protocol.  [facility] called to admit.  Patient states he used to be on Lantus a year and a half ago but his insurance [employer] stopped paying for it so he was switched to [employer].  States dose of Basaglar was eventually increased to 20 units twice daily and he was also put on Humalog 8 units 3 times a day with meals to control his blood sugars.  However, this resulted in his sugar being too low and EMS had to be called twice last month.  He subsequently reduced the dose of his home Basaglar to 15 units twice daily.  He is now on Medicaid and ran out of Basaglar 2 days ago.  When he contacted his pharmacy, he was told that his new insurance is no longer paying for this medication and that he will have to be switched to Lantus but he has not heard back from the pharmacy yet.  Patient states he was switched from Humalog to NovoLog and he has been taking it based on sliding scale.  He has been checking his blood sugars constantly at home and they have been high in the 200s.  Denies any change to his diet/increased carbohydrate intake.  Patient states his PCP had referred him to endocrinology back in October last year and he  has his first visit scheduled with them next month.  He has no other complaints.  Denies fevers, cough, shortness of breath, chest pain, nausea, vomiting, or abdominal pain.  Review of Systems:  Review of Systems  All other systems reviewed and are negative.   Past Medical History:  Diagnosis Date   Diabetes mellitus    Hypertension    Seizures (HCC)     Past Surgical History:  Procedure Laterality Date   HERNIA REPAIR       reports that he has never smoked. He has never used smokeless tobacco. He reports that he does not currently use alcohol after a past usage of about 1.0 standard drink of alcohol per week. He reports that he does not use drugs.  Allergies  Allergen Reactions   Geodon [Ziprasidone Hcl] Other (See Comments)    Severe muscle spasms (ENTIRE BODY)- had to call EMS   Ziprasidone Mesylate Other (See Comments)    "severe muscle spasms"- Had to call EMS   Amlodipine Swelling    Family History  Problem Relation Age of Onset   Hypertension Mother    Diabetes Paternal Uncle     Prior to Admission medications   Medication Sig Start Date End Date Taking? Authorizing Provider  Accu-Chek Softclix Lancets lancets Use to check blood sugar up to 6 times daily. 11/06/22   [name],  NP  Blood Glucose Monitoring Suppl (ACCU-CHEK GUIDE) w/Device KIT Use to check blood sugar up to 6 times daily. 11/06/22   [name], NP  Blood Pressure Monitoring ([employer] ADVANCED BP MONITOR) DEVI 1 Device by Does not apply route daily. 03/20/22   Waldon Merl, PA-C  Continuous Blood Gluc Receiver (FREESTYLE LIBRE 2 READER) DEVI Use to check blood sugar three times daily. E10.8 03/07/22   Claiborne Rigg, NP  Continuous Blood Gluc Sensor (FREESTYLE LIBRE 2 SENSOR) MISC Use to check blood sugar three times daily. Change sensor every 2 weeks. E10.8 03/07/22   Hoy Register, MD  FLUoxetine (PROZAC) 10 MG capsule Take 1 capsule (10 mg total) by mouth daily. 02/02/22 02/02/23   A[place], Gerrianne Scale A, NP  gabapentin (NEURONTIN) 300 MG capsule Take 1 capsule (300 mg total) by mouth daily. 08/25/22   Lyndle Herrlich, MD  glucose blood (ACCU-CHEK GUIDE) test strip Use to check blood sugar up to 6 times daily. 10/04/22   Hoy Register, MD  ibuprofen (ADVIL) 800 MG tablet Take 1 tablet (800 mg total) by mouth every 8 (eight) hours as needed. Patient taking differently: Take 800 mg by mouth every 8 (eight) hours as needed for mild pain. 02/22/21   Claiborne Rigg, NP  insulin aspart (NOVOLOG FLEXPEN) 100 UNIT/ML FlexPen Inject 8 Units into the skin 3 (three) times daily with meals. 11/15/22   [name], NP  insulin glargine (LANTUS SOLOSTAR) 100 UNIT/ML Solostar Pen Inject 20 Units into the skin 2 (two) times daily. 11/17/22   [name], PA-C  Insulin Pen Needle (B-D UF III MINI PEN NEEDLES) 31G X 5 MM MISC Insert into the skin as prescribed 07/26/21   [name], PA-C  Insulin Pen Needle 32G X 4 MM MISC Use with Humulin 70/30 kwikpen 10/01/22   [name], NP  Insulin Syringes, Disposable, U-100 0.3 ML MISC Use to inject 8 units three times a day 11/15/22   [name], NP  lisinopril-hydrochlorothiazide (ZESTORETIC) 20-25 MG tablet Take 1 tablet by mouth daily. 11/06/22   [name], NP  simvastatin (ZOCOR) 20 MG tablet TAKE ONE TABLET BY MOUTH at bedtime Patient not taking: Reported on 11/15/2022 10/11/21   Hoy Register, MD  triamcinolone cream (KENALOG) 0.1 % Apply 1 Application topically 2 (two) times daily. Do not use for longer than 2 weeks in a row in any 1 location Patient not taking: Reported on 11/15/2022 06/22/22   Olene Floss, PA-C    Physical Exam: Vitals:   11/17/22 1930 11/17/22 2000 11/17/22 2030 11/17/22 2041  BP: (!) 170/88 (!) 175/95 (!) 168/89   Pulse: 62 60 (!) 59   Resp:  18 17   Temp:    98.2 F (36.8 C)  TempSrc:    Oral  SpO2: 100% 100% 100%   Weight:      Height:        Physical Exam Vitals  reviewed.  Constitutional:      General: He is not in acute distress. HENT:     Head: Normocephalic and atraumatic.  Eyes:     Extraocular Movements: Extraocular movements intact.  Cardiovascular:     Rate and Rhythm: Normal rate and regular rhythm.     Pulses: Normal pulses.  Pulmonary:     Effort: Pulmonary effort is normal. No respiratory distress.     Breath sounds: Normal breath sounds. No wheezing or rales.  Abdominal:     General: Bowel sounds are  normal. There is no distension.     Palpations: Abdomen is soft.     Tenderness: There is no abdominal tenderness.  Musculoskeletal:     Cervical back: Normal range of motion.     Right lower leg: No edema.     Left lower leg: No edema.  Skin:    General: Skin is warm and dry.  Neurological:     General: No focal deficit present.     Mental Status: He is alert and oriented to person, place, and time.     Labs on Admission: I have personally reviewed following labs and imaging studies  CBC: Recent Labs  Lab 11/17/22 1643 11/17/22 1850  WBC 6.0  --   HGB 15.2 16.0  HCT 46.2 47.0  MCV 84.9  --   PLT 262  --    Basic Metabolic Panel: Recent Labs  Lab 11/17/22 1643 11/17/22 1850  NA 129* 129*  K 5.6* 5.6*  CL 96*  --   CO2 21*  --   GLUCOSE 390*  --   BUN 31*  --   CREATININE 1.93*  --   CALCIUM 9.4  --    GFR: Estimated Creatinine Clearance: 49.9 mL/min (A) (by C-G formula based on SCr of 1.93 mg/dL (H)). Liver Function Tests: Recent Labs  Lab 11/17/22 1643  AST 25  ALT 22  ALKPHOS 63  BILITOT 1.3*  PROT 6.9  ALBUMIN 3.6   No results for input(s): "LIPASE", "AMYLASE" in the last 168 hours. No results for input(s): "AMMONIA" in the last 168 hours. Coagulation Profile: No results for input(s): "INR", "PROTIME" in the last 168 hours. Cardiac Enzymes: No results for input(s): "CKTOTAL", "CKMB", "CKMBINDEX", "TROPONINI" in the last 168 hours. BNP (last 3 results) No results for input(s): "PROBNP" in  the last 8760 hours. HbA1C: Recent Labs    11/15/22 1542  HGBA1C 9.4*   CBG: Recent Labs  Lab 11/17/22 1703 11/17/22 2038  GLUCAP 347* 354*   Lipid Profile: No results for input(s): "CHOL", "HDL", "LDLCALC", "TRIG", "CHOLHDL", "LDLDIRECT" in the last 72 hours. Thyroid Function Tests: No results for input(s): "TSH", "T4TOTAL", "FREET4", "T3FREE", "THYROIDAB" in the last 72 hours. Anemia Panel: No results for input(s): "VITAMINB12", "FOLATE", "FERRITIN", "TIBC", "IRON", "RETICCTPCT" in the last 72 hours. Urine analysis:    Component Value Date/Time   COLORURINE YELLOW 11/17/2022 2040   APPEARANCEUR CLEAR 11/17/2022 2040   APPEARANCEUR Clear 02/26/2020 1647   LABSPEC 1.010 11/17/2022 2040   PHURINE 5.0 11/17/2022 2040   GLUCOSEU >=500 (A) 11/17/2022 2040   HGBUR SMALL (A) 11/17/2022 2040   BILIRUBINUR NEGATIVE 11/17/2022 2040   BILIRUBINUR negative 02/26/2020 1651   BILIRUBINUR Negative 02/26/2020 1647   KETONESUR 5 (A) 11/17/2022 2040   PROTEINUR >=300 (A) 11/17/2022 2040   UROBILINOGEN 1.0 04/16/2021 1923   NITRITE NEGATIVE 11/17/2022 2040   LEUKOCYTESUR NEGATIVE 11/17/2022 2040    Radiological Exams on Admission: No results found.  EKG: Independently reviewed.  Sinus bradycardia, LVH, ST abnormality in V2 and V3.  No significant change since prior tracing.  Assessment and Plan  Poorly controlled type 1 diabetes with ?early/mild DKA A1c 9.4 on labs done 2 days ago.  Due to change in job/switching insurance he has had difficulty getting his long-acting insulin from the pharmacy due to cost issues.  Ran out of his home Basaglar 2 days ago.  Labs showing glucose 390, bicarb 21, anion gap 12, VBG with normal pH.  However, beta hydroxybutyric acid elevated at 1.91 and  UA with some ketones.  Patient was given Semglee 20 units in the ED and subsequently started on IV insulin and IV fluids.  Keep n.p.o. and continue DKA protocol for now.  Frequent CBG checks per Endo tool.   Monitor BMP every 4 hours and repeat beta hydroxybutyric acid.  He can likely be switched to basal/bolus regimen soon based on labs.  Diabetes coordinator consulted.  Patient states he was referred to endocrinology by his PCP and his first appointment is next month.  Hypertensive urgency Blood pressure elevated with systolic in the 180-190s.  Holding lisinopril and hydrochlorothiazide due to mild AKI.  IV hydralazine PRN.  Pseudohyponatremia Sodium 134 when corrected for hyperglycemia, continue to monitor.  Mild hyperkalemia Should improve with insulin, continue to monitor.  Mild AKI on CKD stage IIIa Creatinine currently 1.9, baseline 1.4-1.7.  Patient is receiving IV fluids, continue to monitor labs.  Avoid nephrotoxic agents/hold home lisinopril and hydrochlorothiazide at this time.  DVT prophylaxis: Lovenox Code Status: Full Code (discussed with the patient) Level of care: Progressive Care Unit Admission status: It is my clinical opinion that referral for OBSERVATION is reasonable and necessary in this patient based on the above information provided. The aforementioned taken together are felt to place the patient at high risk for further clinical deterioration. However, it is anticipated that the patient may be medically stable for discharge from the hospital within 24 to 48 hours.   John Giovanni MD [facility]  If 7PM-7AM, please contact night-coverage [email or web address]  11/17/2022, 9:44 PM

## 2022-11-18 DIAGNOSIS — Z597 Insufficient social insurance and welfare support: Secondary | ICD-10-CM | POA: Diagnosis not present

## 2022-11-18 DIAGNOSIS — I129 Hypertensive chronic kidney disease with stage 1 through stage 4 chronic kidney disease, or unspecified chronic kidney disease: Secondary | ICD-10-CM | POA: Diagnosis present

## 2022-11-18 DIAGNOSIS — S6992XA Unspecified injury of left wrist, hand and finger(s), initial encounter: Secondary | ICD-10-CM | POA: Diagnosis present

## 2022-11-18 DIAGNOSIS — Z8249 Family history of ischemic heart disease and other diseases of the circulatory system: Secondary | ICD-10-CM | POA: Diagnosis not present

## 2022-11-18 DIAGNOSIS — E785 Hyperlipidemia, unspecified: Secondary | ICD-10-CM | POA: Diagnosis present

## 2022-11-18 DIAGNOSIS — E875 Hyperkalemia: Secondary | ICD-10-CM | POA: Diagnosis present

## 2022-11-18 DIAGNOSIS — Z79899 Other long term (current) drug therapy: Secondary | ICD-10-CM | POA: Diagnosis not present

## 2022-11-18 DIAGNOSIS — Z794 Long term (current) use of insulin: Secondary | ICD-10-CM | POA: Diagnosis not present

## 2022-11-18 DIAGNOSIS — N1831 Chronic kidney disease, stage 3a: Secondary | ICD-10-CM | POA: Diagnosis present

## 2022-11-18 DIAGNOSIS — Z888 Allergy status to other drugs, medicaments and biological substances status: Secondary | ICD-10-CM | POA: Diagnosis not present

## 2022-11-18 DIAGNOSIS — Z560 Unemployment, unspecified: Secondary | ICD-10-CM | POA: Diagnosis not present

## 2022-11-18 DIAGNOSIS — Z91141 Patient's other noncompliance with medication regimen due to financial hardship: Secondary | ICD-10-CM | POA: Diagnosis not present

## 2022-11-18 DIAGNOSIS — W19XXXA Unspecified fall, initial encounter: Secondary | ICD-10-CM | POA: Diagnosis present

## 2022-11-18 DIAGNOSIS — N179 Acute kidney failure, unspecified: Secondary | ICD-10-CM | POA: Diagnosis present

## 2022-11-18 DIAGNOSIS — M79645 Pain in left finger(s): Secondary | ICD-10-CM | POA: Diagnosis present

## 2022-11-18 DIAGNOSIS — T383X6A Underdosing of insulin and oral hypoglycemic [antidiabetic] drugs, initial encounter: Secondary | ICD-10-CM | POA: Diagnosis present

## 2022-11-18 DIAGNOSIS — E1022 Type 1 diabetes mellitus with diabetic chronic kidney disease: Secondary | ICD-10-CM | POA: Diagnosis present

## 2022-11-18 DIAGNOSIS — E860 Dehydration: Secondary | ICD-10-CM | POA: Diagnosis present

## 2022-11-18 DIAGNOSIS — I160 Hypertensive urgency: Secondary | ICD-10-CM | POA: Diagnosis present

## 2022-11-18 DIAGNOSIS — E1010 Type 1 diabetes mellitus with ketoacidosis without coma: Secondary | ICD-10-CM | POA: Diagnosis present

## 2022-11-18 DIAGNOSIS — Z833 Family history of diabetes mellitus: Secondary | ICD-10-CM | POA: Diagnosis not present

## 2022-11-18 LAB — BASIC METABOLIC PANEL
Anion gap: 9 (ref 5–15)
Anion gap: 7 (ref 5–15)
Anion gap: 8 (ref 5–15)
Anion gap: 9 (ref 5–15)
Anion gap: 6 (ref 5–15)
BUN: 30 mg/dL — ABNORMAL HIGH (ref 6–20)
BUN: 29 mg/dL — ABNORMAL HIGH (ref 6–20)
BUN: 26 mg/dL — ABNORMAL HIGH (ref 6–20)
BUN: 26 mg/dL — ABNORMAL HIGH (ref 6–20)
BUN: 28 mg/dL — ABNORMAL HIGH (ref 6–20)
CO2: 22 mmol/L (ref 22–32)
CO2: 24 mmol/L (ref 22–32)
CO2: 25 mmol/L (ref 22–32)
CO2: 24 mmol/L (ref 22–32)
CO2: 24 mmol/L (ref 22–32)
Calcium: 8.8 mg/dL — ABNORMAL LOW (ref 8.9–10.3)
Calcium: 8.7 mg/dL — ABNORMAL LOW (ref 8.9–10.3)
Calcium: 8.9 mg/dL (ref 8.9–10.3)
Calcium: 8.5 mg/dL — ABNORMAL LOW (ref 8.9–10.3)
Calcium: 8.6 mg/dL — ABNORMAL LOW (ref 8.9–10.3)
Chloride: 102 mmol/L (ref 98–111)
Chloride: 107 mmol/L (ref 98–111)
Chloride: 104 mmol/L (ref 98–111)
Chloride: 100 mmol/L (ref 98–111)
Chloride: 103 mmol/L (ref 98–111)
Creatinine, Ser: 1.8 mg/dL — ABNORMAL HIGH (ref 0.61–1.24)
Creatinine, Ser: 1.68 mg/dL — ABNORMAL HIGH (ref 0.61–1.24)
Creatinine, Ser: 1.61 mg/dL — ABNORMAL HIGH (ref 0.61–1.24)
Creatinine, Ser: 1.71 mg/dL — ABNORMAL HIGH (ref 0.61–1.24)
Creatinine, Ser: 1.76 mg/dL — ABNORMAL HIGH (ref 0.61–1.24)
GFR, Estimated: 47 mL/min — ABNORMAL LOW (ref >60)
GFR, Estimated: 51 mL/min — ABNORMAL LOW (ref >60)
GFR, Estimated: 53 mL/min — ABNORMAL LOW (ref >60)
GFR, Estimated: 50 mL/min — ABNORMAL LOW (ref >60)
GFR, Estimated: 48 mL/min — ABNORMAL LOW (ref >60)
Glucose, Bld: 371 mg/dL — ABNORMAL HIGH (ref 70–99)
Glucose, Bld: 70 mg/dL (ref 70–99)
Glucose, Bld: 96 mg/dL (ref 70–99)
Glucose, Bld: 311 mg/dL — ABNORMAL HIGH (ref 70–99)
Glucose, Bld: 312 mg/dL — ABNORMAL HIGH (ref 70–99)
Potassium: 3.6 mmol/L (ref 3.5–5.1)
Potassium: 3.7 mmol/L (ref 3.5–5.1)
Potassium: 4.2 mmol/L (ref 3.5–5.1)
Potassium: 4.7 mmol/L (ref 3.5–5.1)
Potassium: 4.2 mmol/L (ref 3.5–5.1)
Sodium: 133 mmol/L — ABNORMAL LOW (ref 135–145)
Sodium: 138 mmol/L (ref 135–145)
Sodium: 137 mmol/L (ref 135–145)
Sodium: 133 mmol/L — ABNORMAL LOW (ref 135–145)
Sodium: 133 mmol/L — ABNORMAL LOW (ref 135–145)

## 2022-11-18 LAB — BETA-HYDROXYBUTYRIC ACID
Beta-Hydroxybutyric Acid: 0.44 mmol/L — ABNORMAL HIGH (ref 0.05–0.27)
Beta-Hydroxybutyric Acid: 0.06 mmol/L (ref 0.05–0.27)
Beta-Hydroxybutyric Acid: 0.47 mmol/L — ABNORMAL HIGH (ref 0.05–0.27)

## 2022-11-18 LAB — GLUCOSE, CAPILLARY
Glucose-Capillary: 117 mg/dL — ABNORMAL HIGH (ref 70–99)
Glucose-Capillary: 131 mg/dL — ABNORMAL HIGH (ref 70–99)
Glucose-Capillary: 142 mg/dL — ABNORMAL HIGH (ref 70–99)
Glucose-Capillary: 184 mg/dL — ABNORMAL HIGH (ref 70–99)
Glucose-Capillary: 185 mg/dL — ABNORMAL HIGH (ref 70–99)
Glucose-Capillary: 251 mg/dL — ABNORMAL HIGH (ref 70–99)
Glucose-Capillary: 323 mg/dL — ABNORMAL HIGH (ref 70–99)
Glucose-Capillary: 340 mg/dL — ABNORMAL HIGH (ref 70–99)
Glucose-Capillary: 365 mg/dL — ABNORMAL HIGH (ref 70–99)
Glucose-Capillary: 420 mg/dL — ABNORMAL HIGH (ref 70–99)
Glucose-Capillary: 58 mg/dL — ABNORMAL LOW (ref 70–99)
Glucose-Capillary: 72 mg/dL (ref 70–99)

## 2022-11-18 MED ORDER — INSULIN GLARGINE-YFGN 100 UNIT/ML ~~LOC~~ SOLN
10.0000 [IU] | Freq: Every day | SUBCUTANEOUS | Status: DC
  Administered 2022-11-18: 10 [IU] via SUBCUTANEOUS
  Filled 2022-11-18 (×2): qty 0.1

## 2022-11-18 MED ORDER — INSULIN ASPART 100 UNIT/ML IJ SOLN
2.0000 [IU] | Freq: Three times a day (TID) | INTRAMUSCULAR | Status: DC
  Administered 2022-11-18: 2 [IU] via SUBCUTANEOUS

## 2022-11-18 MED ORDER — INSULIN GLARGINE-YFGN 100 UNIT/ML ~~LOC~~ SOLN
5.0000 [IU] | Freq: Every day | SUBCUTANEOUS | Status: DC
  Administered 2022-11-18: 5 [IU] via SUBCUTANEOUS
  Filled 2022-11-18: qty 0.05

## 2022-11-18 MED ORDER — SODIUM CHLORIDE 0.9 % IV SOLN: INTRAVENOUS | Status: AC

## 2022-11-18 MED ORDER — INSULIN ASPART 100 UNIT/ML IJ SOLN
3.0000 [IU] | Freq: Three times a day (TID) | INTRAMUSCULAR | Status: DC
  Administered 2022-11-18 – 2022-11-19 (×4): 3 [IU] via SUBCUTANEOUS

## 2022-11-18 MED ORDER — INSULIN ASPART 100 UNIT/ML IJ SOLN
0.0000 [IU] | Freq: Every day | INTRAMUSCULAR | Status: DC
  Administered 2022-11-18: 4 [IU] via SUBCUTANEOUS

## 2022-11-18 MED ORDER — INSULIN ASPART 100 UNIT/ML IJ SOLN
0.0000 [IU] | Freq: Three times a day (TID) | INTRAMUSCULAR | Status: DC
  Administered 2022-11-18: 9 [IU] via SUBCUTANEOUS
  Administered 2022-11-18: 1 [IU] via SUBCUTANEOUS
  Administered 2022-11-19: 3 [IU] via SUBCUTANEOUS
  Administered 2022-11-19 (×2): 2 [IU] via SUBCUTANEOUS

## 2022-11-18 NOTE — Progress Notes (Signed)
Charge nurse on [address] made aware of order for transfer to Medsurg floor. Awaiting bed placement.

## 2022-11-18 NOTE — Progress Notes (Signed)
Dr. Margo Aye messaged via secure chat during change of shift report. Was informed by night shift that EndoTool and Insulin drip was stopped due to blood sugar being 117. Awaiting recommendations.

## 2022-11-18 NOTE — Plan of Care (Signed)
Problem: Education: Goal: Ability to describe self-care measures that may prevent or decrease complications (Diabetes Survival Skills Education) will improve 11/18/2022 1351 by Hattie Perch, RN Outcome: Progressing 11/18/2022 1351 by Hattie Perch, RN Outcome: Progressing Goal: Individualized Educational Video(s) 11/18/2022 1351 by Hattie Perch, RN Outcome: Progressing 11/18/2022 1351 by Hattie Perch, RN Outcome: Progressing   Problem: Coping: Goal: Ability to adjust to condition or change in health will improve 11/18/2022 1351 by Hattie Perch, RN Outcome: Progressing 11/18/2022 1351 by Hattie Perch, RN Outcome: Progressing   Problem: Fluid Volume: Goal: Ability to maintain a balanced intake and output will improve 11/18/2022 1351 by Hattie Perch, RN Outcome: Progressing 11/18/2022 1351 by Hattie Perch, RN Outcome: Progressing   Problem: Health Behavior/Discharge Planning: Goal: Ability to identify and utilize available resources and services will improve 11/18/2022 1351 by Hattie Perch, RN Outcome: Progressing 11/18/2022 1351 by Hattie Perch, RN Outcome: Progressing Goal: Ability to manage health-related needs will improve 11/18/2022 1351 by Hattie Perch, RN Outcome: Progressing 11/18/2022 1351 by Hattie Perch, RN Outcome: Progressing   Problem: Metabolic: Goal: Ability to maintain appropriate glucose levels will improve 11/18/2022 1351 by Hattie Perch, RN Outcome: Progressing 11/18/2022 1351 by Hattie Perch, RN Outcome: Progressing   Problem: Nutritional: Goal: Maintenance of adequate nutrition will improve 11/18/2022 1351 by Hattie Perch, RN Outcome: Progressing 11/18/2022 1351 by Hattie Perch, RN Outcome: Progressing Goal: Progress toward achieving an optimal weight will improve 11/18/2022 1351 by Hattie Perch, RN Outcome: Progressing 11/18/2022 1351 by Hattie Perch, RN Outcome:  Progressing   Problem: Skin Integrity: Goal: Risk for impaired skin integrity will decrease 11/18/2022 1351 by Hattie Perch, RN Outcome: Progressing 11/18/2022 1351 by Hattie Perch, RN Outcome: Progressing   Problem: Tissue Perfusion: Goal: Adequacy of tissue perfusion will improve 11/18/2022 1351 by Hattie Perch, RN Outcome: Progressing 11/18/2022 1351 by Hattie Perch, RN Outcome: Progressing   Problem: Education: Goal: Ability to describe self-care measures that may prevent or decrease complications (Diabetes Survival Skills Education) will improve 11/18/2022 1351 by Hattie Perch, RN Outcome: Progressing 11/18/2022 1351 by Hattie Perch, RN Outcome: Progressing Goal: Individualized Educational Video(s) 11/18/2022 1351 by Hattie Perch, RN Outcome: Progressing 11/18/2022 1351 by Hattie Perch, RN Outcome: Progressing   Problem: Cardiac: Goal: Ability to maintain an adequate cardiac output will improve 11/18/2022 1351 by Hattie Perch, RN Outcome: Progressing 11/18/2022 1351 by Hattie Perch, RN Outcome: Progressing   Problem: Health Behavior/Discharge Planning: Goal: Ability to identify and utilize available resources and services will improve 11/18/2022 1351 by Hattie Perch, RN Outcome: Progressing 11/18/2022 1351 by Hattie Perch, RN Outcome: Progressing Goal: Ability to manage health-related needs will improve 11/18/2022 1351 by Hattie Perch, RN Outcome: Progressing 11/18/2022 1351 by Hattie Perch, RN Outcome: Progressing   Problem: Fluid Volume: Goal: Ability to achieve a balanced intake and output will improve 11/18/2022 1351 by Hattie Perch, RN Outcome: Progressing 11/18/2022 1351 by Hattie Perch, RN Outcome: Progressing   Problem: Metabolic: Goal: Ability to maintain appropriate glucose levels will improve 11/18/2022 1351 by Hattie Perch, RN Outcome: Progressing 11/18/2022 1351 by Hattie Perch, RN Outcome: Progressing   Problem: Nutritional: Goal: Maintenance of adequate nutrition will improve 11/18/2022 1351 by Hattie Perch, RN Outcome: Progressing 11/18/2022 1351 by Hattie Perch, RN Outcome: Progressing Goal: Maintenance of adequate weight for body size and type  will improve 11/18/2022 1351 by Hattie Perch, RN Outcome: Progressing 11/18/2022 1351 by Hattie Perch, RN Outcome: Progressing   Problem: Respiratory: Goal: Will regain and/or maintain adequate ventilation 11/18/2022 1351 by Hattie Perch, RN Outcome: Progressing 11/18/2022 1351 by Hattie Perch, RN Outcome: Progressing   Problem: Urinary Elimination: Goal: Ability to achieve and maintain adequate renal perfusion and functioning will improve 11/18/2022 1351 by Hattie Perch, RN Outcome: Progressing 11/18/2022 1351 by Hattie Perch, RN Outcome: Progressing   Problem: Education: Goal: Knowledge of General Education information will improve Description: Including pain rating scale, medication(s)/side effects and non-pharmacologic comfort measures 11/18/2022 1351 by Hattie Perch, RN Outcome: Progressing 11/18/2022 1351 by Hattie Perch, RN Outcome: Progressing   Problem: Health Behavior/Discharge Planning: Goal: Ability to manage health-related needs will improve 11/18/2022 1351 by Hattie Perch, RN Outcome: Progressing 11/18/2022 1351 by Hattie Perch, RN Outcome: Progressing   Problem: Clinical Measurements: Goal: Ability to maintain clinical measurements within normal limits will improve 11/18/2022 1351 by Hattie Perch, RN Outcome: Progressing 11/18/2022 1351 by Hattie Perch, RN Outcome: Progressing Goal: Will remain free from infection 11/18/2022 1351 by Hattie Perch, RN Outcome: Progressing 11/18/2022 1351 by Hattie Perch, RN Outcome: Progressing Goal: Diagnostic test results will improve 11/18/2022 1351 by Hattie Perch,  RN Outcome: Progressing 11/18/2022 1351 by Hattie Perch, RN Outcome: Progressing Goal: Respiratory complications will improve 11/18/2022 1351 by Hattie Perch, RN Outcome: Progressing 11/18/2022 1351 by Hattie Perch, RN Outcome: Progressing Goal: Cardiovascular complication will be avoided 11/18/2022 1351 by Hattie Perch, RN Outcome: Progressing 11/18/2022 1351 by Hattie Perch, RN Outcome: Progressing   Problem: Activity: Goal: Risk for activity intolerance will decrease 11/18/2022 1351 by Hattie Perch, RN Outcome: Progressing 11/18/2022 1351 by Hattie Perch, RN Outcome: Progressing   Problem: Nutrition: Goal: Adequate nutrition will be maintained 11/18/2022 1351 by Hattie Perch, RN Outcome: Progressing 11/18/2022 1351 by Hattie Perch, RN Outcome: Progressing   Problem: Coping: Goal: Level of anxiety will decrease 11/18/2022 1351 by Hattie Perch, RN Outcome: Progressing 11/18/2022 1351 by Hattie Perch, RN Outcome: Progressing   Problem: Elimination: Goal: Will not experience complications related to bowel motility 11/18/2022 1351 by Hattie Perch, RN Outcome: Progressing 11/18/2022 1351 by Hattie Perch, RN Outcome: Progressing Goal: Will not experience complications related to urinary retention 11/18/2022 1351 by Hattie Perch, RN Outcome: Progressing 11/18/2022 1351 by Hattie Perch, RN Outcome: Progressing   Problem: Pain Managment: Goal: General experience of comfort will improve 11/18/2022 1351 by Hattie Perch, RN Outcome: Progressing 11/18/2022 1351 by Hattie Perch, RN Outcome: Progressing   Problem: Safety: Goal: Ability to remain free from injury will improve 11/18/2022 1351 by Hattie Perch, RN Outcome: Progressing 11/18/2022 1351 by Hattie Perch, RN Outcome: Progressing   Problem: Skin Integrity: Goal: Risk for impaired skin integrity will decrease 11/18/2022 1351 by Hattie Perch, RN Outcome: Progressing 11/18/2022 1351 by Hattie Perch, RN Outcome: Progressing

## 2022-11-18 NOTE — Progress Notes (Signed)
6:34AM CBG went  down to 58. D50W given based on EndoTool. IV insulin been clamped since 6:02AM. Checked CBG again at 6:57AM result was 117. EndoTool stopped at the mean time, MD notified both on call and attending but no response received.

## 2022-11-18 NOTE — Inpatient Diabetes Management (Signed)
Inpatient Diabetes Program Recommendations  AACE/ADA: New Consensus Statement on Inpatient Glycemic Control (2015)  Target Ranges:  Prepandial:   less than 140 mg/dL      Peak postprandial:   less than 180 mg/dL (1-2 hours)      Critically ill patients:  140 - 180 mg/dL   Lab Results  Component Value Date   GLUCAP 117 (H) 11/18/2022   HGBA1C 9.4 (A) 11/15/2022    Review of Glycemic Control  Latest Reference Range & Units 11/18/22 06:57  Glucose-Capillary 70 - 99 mg/dL 119 (H)   Diabetes history: DM Outpatient Diabetes medications:  Basaglar 15 units bid (ran out) Novolog 8 units tid with meals Current orders for Inpatient glycemic control:  Novolog 0-9 units tid with meals and HS Semglee 5 units daily  Inpatient Diabetes Program Recommendations:    Note patient received Semglee 12 units prior to starting IV insulin.  This may have contributed to lower blood sugars this morning.  Consider changing Semglee to 14 units q HS (starting tonight).  Also consider adding Novolog meal coverage 3 units tid with meals (hold if patient eats less than 50% or NPO).  DM coordinator will see patient on 5/13 and assist with finding out what medications are covered by his insurance.   Thanks Beryl Meager, RN, BC-ADM Inpatient Diabetes Coordinator Pager [phone]  (8a-5p)

## 2022-11-18 NOTE — Progress Notes (Signed)
PROGRESS NOTE  [name] [id number]:[id number] DOB: [birth year] DOA: 11/17/2022 PCP: [name], NP  HPI/Recap of past 24 hours:  [name] is a 47 y.o. male with medical history significant of type 1 diabetes, hypertension, CKD stage IIIa, hyperlipidemia, came into the ED as he ran out of his home long-acting insulin 2 days ago.  Hypertensive but vital signs otherwise stable.  Labs showing WBC 6.0, hemoglobin 15.2, platelet count 262k, sodium 129, potassium 5.6, chloride 96, bicarb 21, anion gap 12, BUN 31, creatinine 1.9, glucose 390, beta hydroxybutyric acid 1.91, VBG with pH 7.28, UA with 5 ketones.  Patient received Semglee 20 units in the ED and was subsequently started on IV insulin and IV fluids per DKA protocol.  Admitted by [facility], hospitalist service.   11/18/2022: The patient was seen and examined at the bedside.  No acute events overnight.  He was transitioned to subcu insulin.  He has no new complaints.    Assessment/Plan: Principal Problem:   DKA, type 1 (HCC) Active Problems:   Pseudohyponatremia   Hyperkalemia   AKI (acute kidney injury) (HCC)   Hypertensive urgency  Mild DKA type I secondary to missing home dose of insulin Transitioned to subcu insulin on 11/10/2022 Hemoglobin A1c 8.9 on 08/22/2022 Avoid hypoglycemia, Semglee 10 units nightly, NovoLog 2 unit 3 times daily, insulin sign scale. Diabetes coordinator following, appreciate assistance. TOC consulted to assist with insulin medication  AKI on CKD 3A, likely prerenal in the setting of dehydration Presented with creatinine of 1.9 Creatinine improved and close to baseline creatinine 1.6 with GFR 53. Avoid nephrotoxic agents, dehydration and hypotension.  Isolated elevated T. bili T. bili 1.3 Avoid hepatotoxic agents. Repeat chemistry panel in the morning   Code Status: Full code  Family Communication: None at bedside  Disposition Plan: Admitted to progressive care unit, transferred to MedSurg  unit   Consultants: Diabetes coordinator  Procedures: None.  Antimicrobials: None.  DVT prophylaxis: Subcu Lovenox daily  Status is: Observation     Objective: Vitals:   11/18/22 0015 11/18/22 0355 11/18/22 0736 11/18/22 1122  BP: (!) 143/70 (!) 119/53 136/71 (!) 157/87  Pulse:  70 (!) 56 77  Resp:  18 18 18   Temp:  97.9 F (36.6 C) 97.9 F (36.6 C) 97.9 F (36.6 C)  TempSrc:  Oral    SpO2:  100%  100%  Weight:      Height:        Intake/Output Summary (Last 24 hours) at 11/18/2022 1319 Last data filed at 11/18/2022 1300 Gross per 24 hour  Intake 120 ml  Output 2250 ml  Net -2130 ml   Filed Weights   11/17/22 1639 11/17/22 2320  Weight: 76.2 kg 76.6 kg    Exam:  General: 47 y.o. year-old male well developed well nourished in no acute distress.  Alert and oriented x3. Cardiovascular: Regular rate and rhythm with no rubs or gallops.  No thyromegaly or JVD noted.   Respiratory: Clear to auscultation with no wheezes or rales. Good inspiratory effort. Abdomen: Soft nontender nondistended with normal bowel sounds x4 quadrants. Musculoskeletal: No lower extremity edema. 2/4 pulses in all 4 extremities. Skin: No ulcerative lesions noted or rashes, Psychiatry: Mood is appropriate for condition and setting   Data Reviewed: CBC: Recent Labs  Lab 11/17/22 1643 11/17/22 1850  WBC 6.0  --   HGB 15.2 16.0  HCT 46.2 47.0  MCV 84.9  --   PLT 262  --    Basic  Metabolic Panel: Recent Labs  Lab 11/17/22 1643 11/17/22 1850 11/17/22 2130 11/18/22 0051 11/18/22 0605 11/18/22 1012  NA 129* 129* 129* 133* 138 137  K 5.6* 5.6* 5.3* 3.6 3.7 4.2  CL 96*  --  96* 102 107 104  CO2 21*  --  25 22 24 25   GLUCOSE 390*  --  375* 371* 70 96  BUN 31*  --  31* 30* 29* 26*  CREATININE 1.93*  --  1.96* 1.80* 1.68* 1.61*  CALCIUM 9.4  --  9.1 8.8* 8.7* 8.9   GFR: Estimated Creatinine Clearance: 61.7 mL/min (A) (by C-G formula based on SCr of 1.61 mg/dL (H)). Liver  Function Tests: Recent Labs  Lab 11/17/22 1643  AST 25  ALT 22  ALKPHOS 63  BILITOT 1.3*  PROT 6.9  ALBUMIN 3.6   No results for input(s): "LIPASE", "AMYLASE" in the last 168 hours. No results for input(s): "AMMONIA" in the last 168 hours. Coagulation Profile: No results for input(s): "INR", "PROTIME" in the last 168 hours. Cardiac Enzymes: No results for input(s): "CKTOTAL", "CKMB", "CKMBINDEX", "TROPONINI" in the last 168 hours. BNP (last 3 results) No results for input(s): "PROBNP" in the last 8760 hours. HbA1C: Recent Labs    11/15/22 1542  HGBA1C 9.4*   CBG: Recent Labs  Lab 11/18/22 0500 11/18/22 0602 11/18/22 0634 11/18/22 0657 11/18/22 1120  GLUCAP 131* 72 58* 117* 142*   Lipid Profile: No results for input(s): "CHOL", "HDL", "LDLCALC", "TRIG", "CHOLHDL", "LDLDIRECT" in the last 72 hours. Thyroid Function Tests: No results for input(s): "TSH", "T4TOTAL", "FREET4", "T3FREE", "THYROIDAB" in the last 72 hours. Anemia Panel: No results for input(s): "VITAMINB12", "FOLATE", "FERRITIN", "TIBC", "IRON", "RETICCTPCT" in the last 72 hours. Urine analysis:    Component Value Date/Time   COLORURINE YELLOW 11/17/2022 2040   APPEARANCEUR CLEAR 11/17/2022 2040   APPEARANCEUR Clear 02/26/2020 1647   LABSPEC 1.010 11/17/2022 2040   PHURINE 5.0 11/17/2022 2040   GLUCOSEU >=500 (A) 11/17/2022 2040   HGBUR SMALL (A) 11/17/2022 2040   BILIRUBINUR NEGATIVE 11/17/2022 2040   BILIRUBINUR negative 02/26/2020 1651   BILIRUBINUR Negative 02/26/2020 1647   KETONESUR 5 (A) 11/17/2022 2040   PROTEINUR >=300 (A) 11/17/2022 2040   UROBILINOGEN 1.0 04/16/2021 1923   NITRITE NEGATIVE 11/17/2022 2040   LEUKOCYTESUR NEGATIVE 11/17/2022 2040   Sepsis Labs: @LABRCNTIP (procalcitonin:4,lacticidven:4)  )No results found for this or any previous visit (from the past 240 hour(s)).    Studies: No results found.  Scheduled Meds:  enoxaparin (LOVENOX) injection  40 mg Subcutaneous  Q24H   insulin aspart  0-5 Units Subcutaneous QHS   insulin aspart  0-9 Units Subcutaneous TID WC   insulin aspart  2 Units Subcutaneous TID WC   insulin glargine-yfgn  10 Units Subcutaneous QHS    Continuous Infusions:  sodium chloride 50 mL/hr at 11/18/22 0833     LOS: 0 days     Darlin Drop, MD [facility] Pager [phone]  If 7PM-7AM, please contact night-coverage [email or web address] Password [email or web address] 11/18/2022, 1:19 PM

## 2022-11-19 ENCOUNTER — Other Ambulatory Visit (HOSPITAL_COMMUNITY): Payer: Self-pay

## 2022-11-19 ENCOUNTER — Other Ambulatory Visit: Payer: Self-pay

## 2022-11-19 ENCOUNTER — Telehealth: Payer: Self-pay | Admitting: Pharmacy Technician

## 2022-11-19 DIAGNOSIS — E1010 Type 1 diabetes mellitus with ketoacidosis without coma: Secondary | ICD-10-CM | POA: Diagnosis not present

## 2022-11-19 LAB — BASIC METABOLIC PANEL
Anion gap: 3 — ABNORMAL LOW (ref 5–15)
BUN: 25 mg/dL — ABNORMAL HIGH (ref 6–20)
CO2: 25 mmol/L (ref 22–32)
Calcium: 8.5 mg/dL — ABNORMAL LOW (ref 8.9–10.3)
Chloride: 106 mmol/L (ref 98–111)
Creatinine, Ser: 1.64 mg/dL — ABNORMAL HIGH (ref 0.61–1.24)
GFR, Estimated: 52 mL/min — ABNORMAL LOW (ref >60)
Glucose, Bld: 174 mg/dL — ABNORMAL HIGH (ref 70–99)
Potassium: 3.8 mmol/L (ref 3.5–5.1)
Sodium: 134 mmol/L — ABNORMAL LOW (ref 135–145)

## 2022-11-19 LAB — GLUCOSE, CAPILLARY
Glucose-Capillary: 168 mg/dL — ABNORMAL HIGH (ref 70–99)
Glucose-Capillary: 194 mg/dL — ABNORMAL HIGH (ref 70–99)
Glucose-Capillary: 245 mg/dL — ABNORMAL HIGH (ref 70–99)

## 2022-11-19 MED ORDER — NOVOLOG FLEXPEN 100 UNIT/ML ~~LOC~~ SOPN
5.0000 [IU] | PEN_INJECTOR | Freq: Three times a day (TID) | SUBCUTANEOUS | 0 refills | Status: DC
Start: 2022-11-19 — End: 2022-11-19

## 2022-11-19 MED ORDER — HYDRALAZINE HCL 10 MG PO TABS: 10.0000 mg | ORAL_TABLET | Freq: Three times a day (TID) | ORAL | 0 refills | Status: DC

## 2022-11-19 MED ORDER — LANTUS SOLOSTAR 100 UNIT/ML ~~LOC~~ SOPN: 10.0000 [IU] | PEN_INJECTOR | Freq: Every day | SUBCUTANEOUS | 0 refills | Status: DC

## 2022-11-19 MED ORDER — HYDRALAZINE HCL 10 MG PO TABS
10.0000 mg | ORAL_TABLET | Freq: Three times a day (TID) | ORAL | 0 refills | Status: DC
  Filled 2022-11-19: qty 180, 60d supply, fill #0

## 2022-11-19 MED ORDER — LANTUS SOLOSTAR 100 UNIT/ML ~~LOC~~ SOPN
10.0000 [IU] | PEN_INJECTOR | Freq: Every day | SUBCUTANEOUS | 0 refills | Status: DC
  Filled 2022-11-19: qty 3, 30d supply, fill #0
  Filled 2022-11-29: qty 3, 30d supply, fill #1

## 2022-11-19 MED ORDER — HYDRALAZINE HCL 10 MG PO TABS
10.0000 mg | ORAL_TABLET | Freq: Three times a day (TID) | ORAL | Status: DC
  Administered 2022-11-19: 10 mg via ORAL
  Filled 2022-11-19: qty 1

## 2022-11-19 MED ORDER — NOVOLOG FLEXPEN 100 UNIT/ML ~~LOC~~ SOPN
5.0000 [IU] | PEN_INJECTOR | Freq: Three times a day (TID) | SUBCUTANEOUS | 0 refills | Status: DC
Start: 2022-11-19 — End: 2023-01-18
  Filled 2022-11-19: qty 15, fill #0
  Filled 2022-11-29: qty 15, 90d supply, fill #0

## 2022-11-19 NOTE — Progress Notes (Signed)
Discharge instructions given to patient at the bedside. PIV removed, site is clean, dry and intact. CCMD notified of d/c. TOC meds at bedside. Work note given. Bus voucher at the bedside.

## 2022-11-19 NOTE — TOC Benefit Eligibility Note (Signed)
Patient Product/process development scientist completed.    The patient is currently admitted and upon discharge could be taking Basaglar Pen.  The current 30 day co-pay is $200.00.   The patient is insured through [employer]   This test claim was processed through [facility]- copay amounts may vary at other pharmacies due to pharmacy/plan contracts, or as the patient moves through the different stages of their insurance plan.  Roland Earl, CPHT Pharmacy Patient Advocate Specialist [facility] Patient Advocate Team Direct Number: [phone]  Fax: [phone]

## 2022-11-19 NOTE — Discharge Summary (Signed)
Discharge Summary  [name] VHQ:[id number] DOB: 1975-08-24  PCP: Donald Sessions, NP  Admit date: 11/17/2022 Discharge date: 11/19/2022  Time spent: 35 minutes   Recommendations for Outpatient Follow-up:  Follow-up with your endocrinologist within 1 to 2 weeks. Follow-up with your primary care provider within a week Take your medications as prescribed  Discharge Diagnoses:  Active Hospital Problems   Diagnosis Date Noted   DKA, type 1 (HCC) 11/17/2022   Pseudohyponatremia 11/17/2022   Hyperkalemia 11/17/2022   AKI (acute kidney injury) (HCC) 11/17/2022   Hypertensive urgency 11/17/2022    Resolved Hospital Problems  No resolved problems to display.    Discharge Condition: Stable  Diet recommendation: Heart healthy carb modified diet  Vitals:   11/19/22 1350 11/19/22 1517  BP: (!) 173/94 (!) 165/77  Pulse:  63  Resp:  18  Temp:  98.2 F (36.8 C)  SpO2:  100%    History of present illness:  [name] is a 47 y.o. male with medical history significant of type 1 diabetes, hypertension, CKD stage IIIa, hyperlipidemia, came into the ED as he ran out of his home long-acting insulin 2 days ago.  Hypertensive but vital signs otherwise stable.  Labs showing WBC 6.0, hemoglobin 15.2, platelet count 262k, sodium 129, potassium 5.6, chloride 96, bicarb 21, anion gap 12, BUN 31, creatinine 1.9, glucose 390, beta hydroxybutyric acid 1.91, VBG with pH 7.28, UA with 5 ketones.  Patient received Semglee 20 units in the ED and was subsequently started on IV insulin and IV fluids per DKA protocol.  Admitted by [facility], hospitalist service.  Transitioned to subcu insulin.  Followed by diabetes coordinator.   11/19/2022: No acute events overnight.  No new complaints.  Hospital Course:  Principal Problem:   DKA, type 1 (HCC) Active Problems:   Pseudohyponatremia   Hyperkalemia   AKI (acute kidney injury) (HCC)   Hypertensive urgency  Resolved mild DKA type I secondary to  missing home dose of insulin Transitioned to subcu insulin on 11/10/2022 Hemoglobin A1c 8.9 on 08/22/2022 Avoid hypoglycemia, Semglee 10 units nightly, NovoLog 5 units 3 times daily Seen by diabetes coordinator.  Appreciate TOC case management assistance.   Resolved AKI on CKD 3A, likely prerenal in the setting of dehydration Presented with creatinine of 1.9 Creatinine is back to baseline. Continue to avoid nephrotoxic agents and dehydration. Follow-up with your PCP within a week      Code Status: Full code        Consultants: Diabetes coordinator TOC, case management.   Procedures: None.   Antimicrobials: None.   Discharge Exam: BP (!) 165/77 (BP Location: Left Arm)   Pulse 63   Temp 98.2 F (36.8 C) (Oral)   Resp 18   Ht 5\' 11"  (1.803 m)   Wt 76.6 kg   SpO2 100%   BMI 23.56 kg/m  General: 47 y.o. year-old male well developed well nourished in no acute distress.  Alert and oriented x3. Cardiovascular: Regular rate and rhythm with no rubs or gallops.  No thyromegaly or JVD noted.   Respiratory: Clear to auscultation with no wheezes or rales. Good inspiratory effort. Abdomen: Soft nontender nondistended with normal bowel sounds x4 quadrants. Musculoskeletal: No lower extremity edema. 2/4 pulses in all 4 extremities. Skin: No ulcerative lesions noted or rashes, Psychiatry: Mood is appropriate for condition and setting  Discharge Instructions You were cared for by a hospitalist during your hospital stay. If you have any questions about your discharge medications or the  care you received while you were in the hospital after you are discharged, you can call the unit and asked to speak with the hospitalist on call if the hospitalist that took care of you is not available. Once you are discharged, your primary care physician will handle any further medical issues. Please note that NO REFILLS for any discharge medications will be authorized once you are discharged, as it is  imperative that you return to your primary care physician (or establish a relationship with a primary care physician if you do not have one) for your aftercare needs so that they can reassess your need for medications and monitor your lab values.   Allergies as of 11/19/2022       Reactions   Geodon [ziprasidone Hcl] Other (See Comments)   Severe muscle spasms (ENTIRE BODY)- had to call EMS   Amlodipine Hives, Swelling   Lower extremities        Medication List     STOP taking these medications    lisinopril-hydrochlorothiazide 20-25 MG tablet Commonly known as: ZESTORETIC       TAKE these medications    Accu-Chek Guide test strip Generic drug: glucose blood Use to check blood sugar up to 6 times daily.   Accu-Chek Guide w/Device Kit Use to check blood sugar up to 6 times daily.   Accu-Chek Softclix Lancets lancets Use to check blood sugar up to 6 times daily.   [facility] Advanced BP Monitor Devi 1 Device by Does not apply route daily.   FreeStyle Libre 2 Reader [place] Use to check blood sugar three times daily. E10.8   FreeStyle Libre 2 Sensor Misc Use to check blood sugar three times daily. Change sensor every 2 weeks. E10.8   gabapentin 300 MG capsule Commonly known as: NEURONTIN Take 1 capsule (300 mg total) by mouth daily.   hydrALAZINE 10 MG tablet Commonly known as: APRESOLINE Take 1 tablet (10 mg total) by mouth every 8 (eight) hours.   Insulin Syringes (Disposable) U-100 0.3 ML Misc Use to inject 8 units three times a day   Lantus SoloStar 100 UNIT/ML Solostar Pen Generic drug: insulin glargine Inject 10 Units into the skin at bedtime. What changed:  how much to take when to take this   NovoLOG FlexPen 100 UNIT/ML FlexPen Generic drug: insulin aspart Inject 5 Units into the skin 3 (three) times daily with meals. SSI What changed:  how much to take additional instructions       Allergies  Allergen Reactions   Geodon [Ziprasidone Hcl] Other  (See Comments)    Severe muscle spasms (ENTIRE BODY)- had to call EMS   Amlodipine Hives and Swelling    Lower extremities    Follow-up Information     Donald Sessions, NP. Call today.   Specialty: Internal Medicine Why: Please call for a post hospital follow up appointment Contact information: [address] [place] [address] [phone]                  The results of significant diagnostics from this hospitalization (including imaging, microbiology, ancillary and laboratory) are listed below for reference.    Significant Diagnostic Studies: DG Finger Thumb Left  Result Date: 11/08/2022 CLINICAL DATA:  Pain EXAM: LEFT THUMB 2+V COMPARISON:  None Available. FINDINGS: There is no evidence of fracture or dislocation. There is no evidence of arthropathy or other focal bone abnormality. Soft tissues are unremarkable. IMPRESSION: Negative. Electronically Signed   By: Mcneil Sober.D.  On: 11/08/2022 18:42    Microbiology: No results found for this or any previous visit (from the past 240 hour(s)).   Labs: Basic Metabolic Panel: Recent Labs  Lab 11/18/22 0605 11/18/22 1012 11/18/22 1433 11/18/22 1734 11/19/22 0043  NA 138 137 133* 133* 134*  K 3.7 4.2 4.7 4.2 3.8  CL 107 104 100 103 106  CO2 24 25 24 24 25   GLUCOSE 70 96 311* 312* 174*  BUN 29* 26* 26* 28* 25*  CREATININE 1.68* 1.61* 1.71* 1.76* 1.64*  CALCIUM 8.7* 8.9 8.5* 8.6* 8.5*   Liver Function Tests: Recent Labs  Lab 11/17/22 1643  AST 25  ALT 22  ALKPHOS 63  BILITOT 1.3*  PROT 6.9  ALBUMIN 3.6   No results for input(s): "LIPASE", "AMYLASE" in the last 168 hours. No results for input(s): "AMMONIA" in the last 168 hours. CBC: Recent Labs  Lab 11/17/22 1643 11/17/22 1850  WBC 6.0  --   HGB 15.2 16.0  HCT 46.2 47.0  MCV 84.9  --   PLT 262  --    Cardiac Enzymes: No results for input(s): "CKTOTAL", "CKMB", "CKMBINDEX", "TROPONINI" in the last 168 hours. BNP: BNP (last 3  results) No results for input(s): "BNP" in the last 8760 hours.  ProBNP (last 3 results) No results for input(s): "PROBNP" in the last 8760 hours.  CBG: Recent Labs  Lab 11/18/22 1120 11/18/22 1553 11/18/22 2108 11/19/22 0627 11/19/22 1158  GLUCAP 142* 365* 340* 168* 194*       Signed:  Darlin Drop, MD [facility] 11/19/2022, 3:25 PM

## 2022-11-19 NOTE — TOC Progression Note (Signed)
Transition of Care [facility]) - Progression Note    Patient Details  Name: [name] MRN: [id number] Date of Birth: 09-19-1975  Transition of [facility]) CM/SW Contact  Leander Rams, LCSW Phone Number: 11/19/2022, 4:13 PM  Clinical Narrative:    CM notified CSW that pt is in need of a bus pass. CSW provided pt with bus pass for dc.   TOC will remain available should any additional needs arise.    Barriers to Discharge: No Barriers Identified  Expected Discharge Plan and Services                                               Social Determinants of Health (SDOH) Interventions SDOH Screenings   Food Insecurity: Food Insecurity Present (11/17/2022)  Housing: High Risk (11/17/2022)  Transportation Needs: Unmet Transportation Needs (11/17/2022)  Utilities: Not At Risk (11/17/2022)  Depression (PHQ2-9): Medium Risk (11/15/2022)  Financial Resource Strain: Medium Risk (11/15/2022)  Physical Activity: Sufficiently Active (11/15/2022)  Social Connections: Unknown (11/15/2022)  Stress: Stress Concern Present (11/15/2022)  Tobacco Use: Low Risk  (11/17/2022)    Readmission Risk Interventions    08/24/2022    2:43 PM  Readmission Risk Prevention Plan  Post Dischage Appt Complete  Medication Screening Complete  Transportation Screening Complete  Oletta Lamas, MSW, LCSWA, LCASA Transitions of Care  Clinical Social Worker I

## 2022-11-19 NOTE — Telephone Encounter (Signed)
Patient Advocate Encounter   Received notification  that prior authorization for Basaglar Pen is required.   PA submitted on 11/19/2022 Confirmation # [id number] W Insurance [place] Medicaid Status is pending       Roland Earl, CPhT Pharmacy Patient Advocate Specialist [facility] Patient Advocate Team Direct Number: [phone]  Fax: [phone]

## 2022-11-19 NOTE — TOC Transition Note (Signed)
Transition of Care [facility]) - CM/SW Discharge Note   Patient Details  Name: [name] MRN: [id number] Date of Birth: [birth year]  [facility]) CM/SW Contact:  Epifanio Lesches, RN Phone Number: 11/19/2022, 3:42 PM   Clinical Narrative:    Patient will DC to: home Anticipated DC date: 11/19/2022 Family notified:yes Transport by: car   Per MD patient ready for DC today . RN, patient aware of DC. Pt unable to afford copay for meds. TOC pharmacy to deliver RX meds to bedside prior to d/c. Pharmacy to bill pt for copay. Post hospital f/u noted on AVS and reinforced with pt by NCM. Pt without transportation to home . Bus pass will be provided to pt by CSW.Marland Kitchen Pt without DME/ home health needs. RNCM will sign off for now as intervention is no longer needed. Please consult [place] again if new needs arise.    Final next level of care: Home/Self Care Barriers to Discharge: No Barriers Identified   Patient Goals and CMS Choice      Discharge Placement                         Discharge Plan and Services Additional resources added to the After Visit Summary for                                       Social Determinants of Health (SDOH) Interventions SDOH Screenings   Food Insecurity: Food Insecurity Present (11/17/2022)  Housing: High Risk (11/17/2022)  Transportation Needs: Unmet Transportation Needs (11/17/2022)  Utilities: Not At Risk (11/17/2022)  Depression (PHQ2-9): Medium Risk (11/15/2022)  Financial Resource Strain: Medium Risk (11/15/2022)  Physical Activity: Sufficiently Active (11/15/2022)  Social Connections: Unknown (11/15/2022)  Stress: Stress Concern Present (11/15/2022)  Tobacco Use: Low Risk  (11/17/2022)     Readmission Risk Interventions    08/24/2022    2:43 PM  Readmission Risk Prevention Plan  Post Dischage Appt Complete  Medication Screening Complete  Transportation Screening Complete

## 2022-11-19 NOTE — Inpatient Diabetes Management (Signed)
Inpatient Diabetes Program Recommendations  AACE/ADA: New Consensus Statement on Inpatient Glycemic Control (2015)  Target Ranges:  Prepandial:   less than 140 mg/dL      Peak postprandial:   less than 180 mg/dL (1-2 hours)      Critically ill patients:  140 - 180 mg/dL   Lab Results  Component Value Date   GLUCAP 194 (H) 11/19/2022   HGBA1C 9.4 (A) 11/15/2022    Review of Glycemic Control  Latest Reference Range & Units 11/18/22 21:08 11/19/22 06:27 11/19/22 11:58  Glucose-Capillary 70 - 99 mg/dL 562 (H) 130 (H) 865 (H)  (H): Data is abnormally high Diabetes history: DM Outpatient Diabetes medications:  Basaglar 15 units bid (ran out) Novolog 8 units tid with meals Current orders for Inpatient glycemic control:  Novolog 0-9 units tid with meals and HS Semglee 10 units daily Novolog 3 units TID   Inpatient Diabetes Program Recommendations:     Consider increasing Semglee to 12 units QD.  Also, at discharge: consider reducing insulin to current inpatient dosing.   Spoke with patient regarding outpatient diabetes management. Ran out of Basaglar and had received a prescription for Lantus from [facility] but couldn't have it filled over the weekend. Patient can afford insulin.  Reviewed patient's current A1c of 8.9% Explained what a A1c is and what it measures. Also reviewed goal A1c with patient, importance of good glucose control @ home, and blood sugar goals. Reviewed patho of DM, current dosing, survival skills, 70/30 as back up which he has done before, vascular changes, and commorbidities.  Patient in need of Dexcom; provided. Has a plan to follow up with referral for endocrinology in June at [place].  Admits to having frequent lows with outpatient regimen that included two separate EMS encounters. Patient feels doses need reducing.  Encouraged to make follow up with [facility].  Confusion over listed insurances; patient states he has attempted to cancel [facility] several times. If successful,  he will be able to better afford medications and prefers Lantus as it is currently ordered. However, can afford Basaglar if necessary using copay card.  No further questions at this time.   Thanks, Lujean Rave, MSN, RNC-OB Diabetes Coordinator [phone] (8a-5p)

## 2022-11-20 ENCOUNTER — Other Ambulatory Visit: Payer: Self-pay

## 2022-11-20 ENCOUNTER — Other Ambulatory Visit (HOSPITAL_COMMUNITY): Payer: Self-pay

## 2022-11-20 ENCOUNTER — Telehealth (INDEPENDENT_AMBULATORY_CARE_PROVIDER_SITE_OTHER): Payer: Self-pay

## 2022-11-20 NOTE — Telephone Encounter (Signed)
Patient Advocate Encounter  Prior Authorization for [employer] Pen  has been approved.    PA# [id number]  Insurance [place] Medicaid Effective dates: 11/19/2022 through 01/06/2023      Roland Earl, CPhT Pharmacy Patient Advocate Specialist [facility] Patient Advocate Team Direct Number: [phone]  Fax: [phone]

## 2022-11-20 NOTE — Transitions of Care (Post Inpatient/ED Visit) (Unsigned)
   11/20/2022  Name: [name] MRN: [id number] DOB: 1976/04/02  Today's TOC FU Call Status: Today's TOC FU Call Status:: Unsuccessul Call (1st Attempt) Unsuccessful Call (1st Attempt) Date: 11/20/22  Attempted to reach the patient regarding the most recent Inpatient/ED visit.  Follow Up Plan: Additional outreach attempts will be made to reach the patient to complete the Transitions of Care (Post Inpatient/ED visit) call.   Signature Agnes Lawrence, CMA (AAMA)  [facility] [phone]

## 2022-11-21 NOTE — Transitions of Care (Post Inpatient/ED Visit) (Unsigned)
   11/21/2022  Name: [name] MRN: [id number] DOB: 10/28/1975  Today's TOC FU Call Status: Today's TOC FU Call Status:: Unsuccessful Call (2nd Attempt) Unsuccessful Call (1st Attempt) Date: 11/20/22 Unsuccessful Call (2nd Attempt) Date: 11/21/22  Attempted to reach the patient regarding the most recent Inpatient/ED visit.  Follow Up Plan: Additional outreach attempts will be made to reach the patient to complete the Transitions of Care (Post Inpatient/ED visit) call.   Signature Agnes Lawrence, CMA (AAMA)  [facility] [phone]

## 2022-11-22 NOTE — Transitions of Care (Post Inpatient/ED Visit) (Signed)
   11/22/2022  Name: [name] MRN: [id number] DOB: 06/18/1976  Today's TOC FU Call Status: Today's TOC FU Call Status:: Unsuccessful Call (3rd Attempt) Unsuccessful Call (1st Attempt) Date: 11/20/22 Unsuccessful Call (2nd Attempt) Date: 11/21/22 Unsuccessful Call (3rd Attempt) Date: 11/22/22  Attempted to reach the patient regarding the most recent Inpatient/ED visit.  Follow Up Plan: No further outreach attempts will be made at this time. We have been unable to contact the patient.  Signature  Agnes Lawrence, CMA (AAMA)  [facility] [phone]

## 2022-11-26 ENCOUNTER — Ambulatory Visit (INDEPENDENT_AMBULATORY_CARE_PROVIDER_SITE_OTHER): Payer: Self-pay | Admitting: *Deleted

## 2022-11-26 NOTE — Telephone Encounter (Signed)
Summary: swelling in left arm/hand   Pt states that he was seen in the ER on 11/17/22 and they poked both his arms 7 times and had to call the IV team to get an IV in his left arm and they hit his nerve ending. Pt states that his arm is still swollen and has some tingling and this is his dominate hand and he cannot do anything with this hand.   Reached out to NT per nurse Paulette it is ok for me to schedule an appt for pt 11/27/22 due to only one appt being available. Still send clinical call.      Called patient [phone] to review sx of left arm/ hand pain , tingling , swelling. No answer, line busy signal. Unable to leave message .

## 2022-11-26 NOTE — Telephone Encounter (Signed)
Called patient emergency contact listed in chart , [place] [phone] and left message for patient to call back to review sx of left hand/ arm swelling, tingling.  Ms. [name] verbalized understanding.

## 2022-11-26 NOTE — Telephone Encounter (Signed)
2nd attempt to contact patient to review sx of [phone]. Busy signal, unable to leave message.

## 2022-11-27 ENCOUNTER — Telehealth: Payer: Self-pay | Admitting: Primary Care

## 2022-11-27 ENCOUNTER — Ambulatory Visit (INDEPENDENT_AMBULATORY_CARE_PROVIDER_SITE_OTHER): Payer: 59 | Admitting: Primary Care

## 2022-11-27 ENCOUNTER — Encounter (INDEPENDENT_AMBULATORY_CARE_PROVIDER_SITE_OTHER): Payer: Self-pay | Admitting: Primary Care

## 2022-11-27 VITALS — BP 162/94 | HR 65 | Resp 16 | Wt 176.6 lb

## 2022-11-27 DIAGNOSIS — F4323 Adjustment disorder with mixed anxiety and depressed mood: Secondary | ICD-10-CM

## 2022-11-27 DIAGNOSIS — I152 Hypertension secondary to endocrine disorders: Secondary | ICD-10-CM

## 2022-11-27 DIAGNOSIS — N183 Chronic kidney disease, stage 3 unspecified: Secondary | ICD-10-CM | POA: Diagnosis not present

## 2022-11-27 DIAGNOSIS — I160 Hypertensive urgency: Secondary | ICD-10-CM | POA: Diagnosis not present

## 2022-11-27 DIAGNOSIS — R202 Paresthesia of skin: Secondary | ICD-10-CM

## 2022-11-27 DIAGNOSIS — E1022 Type 1 diabetes mellitus with diabetic chronic kidney disease: Secondary | ICD-10-CM

## 2022-11-27 DIAGNOSIS — I129 Hypertensive chronic kidney disease with stage 1 through stage 4 chronic kidney disease, or unspecified chronic kidney disease: Secondary | ICD-10-CM | POA: Diagnosis not present

## 2022-11-27 DIAGNOSIS — R200 Anesthesia of skin: Secondary | ICD-10-CM

## 2022-11-27 LAB — GLUCOSE, POCT (MANUAL RESULT ENTRY): POC Glucose: 204 mg/dl — AB (ref 70–99)

## 2022-11-27 MED ORDER — CLONIDINE HCL 0.2 MG PO TABS: 0.2000 mg | ORAL_TABLET | Freq: Every day | ORAL | 0 refills | Status: DC

## 2022-11-27 NOTE — Telephone Encounter (Signed)
Will forward to provider  

## 2022-11-27 NOTE — Progress Notes (Signed)
[facility]  [name], is a 47 y.o. male  WUJ:[id number]  [id number]:[id number]  DOB - 07/26/1975  Chief Complaint  Patient presents with   Hospitalization Follow-up       Subjective:   [name] is a 47 y.o. male here today for a follow up visit. Patient has No headache, No chest pain, No abdominal pain - No Nausea, No new weakness tingling or numbness, No Cough - shortness of breath. He is depression just lost another job for uncontrolled diabetes lows and highs - insurance stop paying for his insulin and Bp elevated. He voiced concerns with numbness and tingling in his left arm states from trying to draw blood and IV in the hospital. There is minor bruising present.  No problems updated.  Allergies  Allergen Reactions   Geodon [Ziprasidone Hcl] Other (See Comments)    Severe muscle spasms (ENTIRE BODY)- had to call EMS   Amlodipine Hives and Swelling    Lower extremities    Past Medical History:  Diagnosis Date   Diabetes mellitus    Hypertension    Seizures (HCC)     Current Outpatient Medications on File Prior to Visit  Medication Sig Dispense Refill   Accu-Chek Softclix Lancets lancets Use to check blood sugar up to 6 times daily. 100 each 6   Blood Glucose Monitoring Suppl (ACCU-CHEK GUIDE) w/Device KIT Use to check blood sugar up to 6 times daily. 1 kit 0   Blood Pressure Monitoring (CVS ADVANCED BP MONITOR) DEVI 1 Device by Does not apply route daily. 1 each 0   Continuous Blood Gluc Receiver (FREESTYLE LIBRE 2 READER) DEVI Use to check blood sugar three times daily. E10.8 1 each 0   Continuous Blood Gluc Sensor (FREESTYLE LIBRE 2 SENSOR) MISC Use to check blood sugar three times daily. Change sensor every 2 weeks. E10.8 2 each 3   gabapentin (NEURONTIN) 300 MG capsule Take 1 capsule (300 mg total) by mouth daily. 30 capsule 0   glucose blood (ACCU-CHEK GUIDE) test strip Use to check blood sugar up to 6 times daily. 100 each 3   hydrALAZINE  (APRESOLINE) 10 MG tablet Take 1 tablet (10 mg total) by mouth every 8 (eight) hours. 180 tablet 0   insulin aspart (NOVOLOG FLEXPEN) 100 UNIT/ML FlexPen Inject 5 Units into the skin 3 (three) times daily with meals. SSI 15 mL 0   insulin glargine (LANTUS SOLOSTAR) 100 UNIT/ML Solostar Pen Inject 10 Units into the skin at bedtime. 15 mL 0   Insulin Syringes, Disposable, U-100 0.3 ML MISC Use to inject 8 units three times a day 100 each 11   No current facility-administered medications on file prior to visit.    Objective:   Vitals:   11/27/22 1002 11/27/22 1057  BP: (Abnormal) 180/95 (Abnormal) 162/94  Pulse: 65   Resp: 16   SpO2: 100%   Weight: 176 lb 9.6 oz (80.1 kg)     Comprehensive ROS Pertinent positive and negative noted in HPI   Exam General appearance : Awake, alert, not in any distress. Speech Clear. Not toxic looking HEENT: Atraumatic and Normocephalic, pupils equally reactive to light and accomodation Neck: Supple, no JVD. No cervical lymphadenopathy.  Chest: Good air entry bilaterally, no added sounds  CVS: S1 S2 regular, no murmurs.  Abdomen: Bowel sounds present, Non tender and not distended with no gaurding, rigidity or rebound. Extremities: B/L Lower Ext shows no edema, both legs are warm to touch Neurology: Awake alert, and oriented X  3, CN II-XII intact, Non focal Skin: No Rash  Data Review Lab Results  Component Value Date   HGBA1C 9.4 (A) 11/15/2022   HGBA1C 8.9 (H) 08/22/2022   HGBA1C 10.0 (H) 11/17/2021    Assessment & Plan   [name] was seen today for hospitalization follow-up.  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 DIET: Limit salt intake, read nutrition labels to check salt content, limit fried and high fatty foods  Avoid using multisymptom OTC cold preparations that generally contain sudafed which can rise BP. Consult with pharmacist on best cold relief products to use for persons with HTN EXERCISE Discussed incorporating exercise such as walking - 30 minutes most days of the week and can do in 10 minute intervals    Patient have been counseled extensively about nutrition and exercise. Other issues discussed during this visit include: low cholesterol diet, weight control and daily exercise, foot care, annual eye examinations at Ophthalmology, importance of adherence with medications and regular follow-up. We also discussed long term complications of uncontrolled diabetes and hypertension.   Numbness and tingling of right arm  Patient requested to be referred to neurology  Return in about 3 weeks (around 12/18/2022) for BP ck.  The patient was given clear instructions to go to ER or return to medical center if symptoms don't improve, worsen or new problems develop. The patient verbalized understanding. The patient was told to call to get lab results if they haven't heard anything in the next week.   This note has been created with Education officer, environmental. Any transcriptional errors are unintentional.   [name], NP 11/30/2022, 1:02 PM

## 2022-11-27 NOTE — Telephone Encounter (Signed)
Copied from CRM [id number]. Topic: Referral - Question >> Nov 27, 2022  2:48 PM Epimenio Foot F wrote: Reason for CRM: Pt is calling in because he had an appointment with [name] and he is requesting a referral for someone to look at his arm as well as a Warden/ranger.

## 2022-11-27 NOTE — Telephone Encounter (Signed)
Pt has an appt schedule for 5/21 at 1030am

## 2022-11-29 ENCOUNTER — Other Ambulatory Visit: Payer: Self-pay

## 2022-11-29 ENCOUNTER — Inpatient Hospital Stay (INDEPENDENT_AMBULATORY_CARE_PROVIDER_SITE_OTHER): Payer: 59 | Admitting: Primary Care

## 2022-11-30 ENCOUNTER — Telehealth (INDEPENDENT_AMBULATORY_CARE_PROVIDER_SITE_OTHER): Payer: Self-pay

## 2022-11-30 NOTE — Telephone Encounter (Signed)
Copied from CRM [id number]. Topic: Referral - Request for Referral >> Nov 29, 2022  3:30 PM Dondra Prader A wrote: Has patient seen PCP for this complaint? Yes.   *If NO, is insurance requiring patient see PCP for this issue before PCP can refer them? Referral for which specialty: neurologist Preferred provider/office: pt would like for PCP to send referral.  Reason for referral: Pt has nerve damage in his arm from where they were trying to draw blood at the hospital two weeks ago. Pt states that it is in his left arm.  Pt phone number:  [phone]

## 2022-11-30 NOTE — Telephone Encounter (Signed)
Will forward to provider  

## 2022-12-04 ENCOUNTER — Telehealth: Payer: Self-pay | Admitting: Primary Care

## 2022-12-04 ENCOUNTER — Telehealth (INDEPENDENT_AMBULATORY_CARE_PROVIDER_SITE_OTHER): Payer: Self-pay | Admitting: Primary Care

## 2022-12-04 NOTE — Telephone Encounter (Signed)
Will forward to provider  

## 2022-12-04 NOTE — Telephone Encounter (Signed)
[facility] from [facility] is needing a new prescription sent over for the pt. Current prescription:insulin glargine (LANTUS SOLOSTAR) 100 UNIT/ML Solostar Pen   Pt states that he is only taking 14 units twice a day instead of 20 units twice a day. Per pt the 20 units was too much for him.  Please advise.   [facility] - [place], [place] - [address] Phone: [phone]  Fax: [phone]

## 2022-12-04 NOTE — Telephone Encounter (Signed)
Copied from CRM [id number]. Topic: Referral - Request for Referral >> Dec 04, 2022  9:48 AM De Blanch wrote: Has patient seen PCP for this complaint? No. *If NO, is insurance requiring patient see PCP for this issue before PCP can refer them? Referral for which specialty: Dentist  Preferred provider/office: N/A Reason for referral:Pt stated he needs a referral to a dentist. Stated he was told in the past that he had a split tooth, and he needs to see a dentist but has been unable to find someone that can help him. Stated that he now has [employer]. He has tasted blood in the past, and he just can't eat on his right side.  Please advise.

## 2022-12-05 ENCOUNTER — Other Ambulatory Visit (INDEPENDENT_AMBULATORY_CARE_PROVIDER_SITE_OTHER): Payer: Self-pay | Admitting: Primary Care

## 2022-12-05 DIAGNOSIS — K029 Dental caries, unspecified: Secondary | ICD-10-CM

## 2022-12-05 NOTE — Telephone Encounter (Signed)
noted 

## 2022-12-05 NOTE — Telephone Encounter (Signed)
Understood. Will update and send rxn once I see him again.

## 2022-12-06 ENCOUNTER — Other Ambulatory Visit (INDEPENDENT_AMBULATORY_CARE_PROVIDER_SITE_OTHER): Payer: Self-pay | Admitting: Primary Care

## 2022-12-09 MED ORDER — CLONIDINE HCL 0.2 MG PO TABS: 0.2000 mg | ORAL_TABLET | Freq: Every day | ORAL | 0 refills | Status: DC

## 2022-12-11 ENCOUNTER — Encounter: Payer: Self-pay | Admitting: Internal Medicine

## 2022-12-11 ENCOUNTER — Ambulatory Visit (INDEPENDENT_AMBULATORY_CARE_PROVIDER_SITE_OTHER): Payer: Self-pay | Admitting: Internal Medicine

## 2022-12-11 VITALS — BP 148/84 | HR 96 | Ht 71.0 in | Wt 178.0 lb

## 2022-12-11 DIAGNOSIS — E1022 Type 1 diabetes mellitus with diabetic chronic kidney disease: Secondary | ICD-10-CM

## 2022-12-11 DIAGNOSIS — N1831 Chronic kidney disease, stage 3a: Secondary | ICD-10-CM

## 2022-12-11 DIAGNOSIS — E1065 Type 1 diabetes mellitus with hyperglycemia: Secondary | ICD-10-CM

## 2022-12-11 DIAGNOSIS — E1040 Type 1 diabetes mellitus with diabetic neuropathy, unspecified: Secondary | ICD-10-CM

## 2022-12-11 MED ORDER — DEXCOM G7 SENSOR MISC: 1.0000 | 3 refills | Status: DC

## 2022-12-11 MED ORDER — OMNIPOD 5 DEXG7G6 PODS GEN 5 MISC: 1.0000 | 2 refills | Status: DC

## 2022-12-11 MED ORDER — OMNIPOD 5 DEXG7G6 INTRO GEN 5 KIT: 1.0000 | PACK | 0 refills | Status: DC

## 2022-12-11 MED ORDER — ROSUVASTATIN CALCIUM 10 MG PO TABS: 10.0000 mg | ORAL_TABLET | Freq: Every day | ORAL | 3 refills | Status: DC

## 2022-12-11 MED ORDER — DEXCOM G6 SENSOR MISC: 1.0000 | 3 refills | Status: DC

## 2022-12-11 MED ORDER — DEXCOM G6 TRANSMITTER MISC: 1.0000 | 3 refills | Status: DC

## 2022-12-11 NOTE — Progress Notes (Signed)
Name: [name]  MRN/ DOB: [id number], 07-09-1976   Age/ Sex: 47 y.o., male    PCP: [name], NP   Reason for Endocrinology Evaluation: Type 1 Diabetes Mellitus     Date of Initial Endocrinology Visit: 12/11/2022     PATIENT IDENTIFIER: [name] is a 47 y.o. male with a past medical history of HTN, DM. The patient presented for initial endocrinology clinic visit on 12/11/2022 for consultative assistance with his diabetes management.    HPI: [name] was    Diagnosed with DM at age 37 Prior Medications tried/Intolerance: insulin .  He was on the pump as a child Currently checking blood sugars multiple  x / day Hypoglycemia episodes : yes               Symptoms: yes                 Frequency: 2/day Hemoglobin A1c has ranged from 7.8% in 2010, peaking at 12.7 % in 2021.   Ed visit 11/17/2022  In terms of diet, the patient eats 4 meals, snacks occasionally.   Had nausea in the morning but this has resolved  Denies constipation or diarrhea     HOME DIABETES REGIMEN: Lantus 14 units daily  NovoLog 1:15 units TIDQAC    Statin: No ACE-I/ARB: No    CONTINUOUS GLUCOSE MONITORING RECORD INTERPRETATION    Dates of Recording: 5/7-12/11/2022  Sensor description: dexcom   Results statistics:   CGM use % of time 7  Average and SD 166  Time in range 54     %  % Time Above 180 41  % Time above 250 2  % Time Below target 2   Glycemic patterns summary: BG is high overnight, fluctuate during the day  Hyperglycemic episodes postprandial  Hypoglycemic episodes occurred post bolus  Overnight periods: High    DIABETIC COMPLICATIONS: Microvascular complications:  CKD, neuropathy Denies:  Last eye exam: Completed 05/10/2021  Macrovascular complications:   Denies: CAD, PVD, CVA   PAST HISTORY: Past Medical History:  Past Medical History:  Diagnosis Date   Diabetes mellitus    Hypertension    Seizures (HCC)    Past Surgical History:  Past  Surgical History:  Procedure Laterality Date   HERNIA REPAIR      Social History:  reports that he has never smoked. He has never used smokeless tobacco. He reports that he does not currently use alcohol after a past usage of about 1.0 standard drink of alcohol per week. He reports that he does not use drugs. Family History:  Family History  Problem Relation Age of Onset   Hypertension Mother    Diabetes Paternal Uncle      HOME MEDICATIONS: Allergies as of 12/11/2022       Reactions   Geodon [ziprasidone Hcl] Other (See Comments)   Severe muscle spasms (ENTIRE BODY)- had to call EMS   Amlodipine Hives, Swelling   Lower extremities        Medication List        Accurate as of December 11, 2022  1:37 PM. If you have any questions, ask your nurse or doctor.          Accu-Chek Guide test strip Generic drug: glucose blood Use to check blood sugar up to 6 times daily.   Accu-Chek Guide w/Device Kit Use to check blood sugar up to 6 times daily.   Accu-Chek Softclix Lancets lancets Use to check blood sugar  up to 6 times daily.   Basaglar KwikPen 100 UNIT/ML Inject 10 Units into the skin at bedtime. What changed: how much to take   cloNIDine 0.2 MG tablet Commonly known as: Catapres Take 1 tablet (0.2 mg total) by mouth daily.   CVS Advanced BP Monitor Devi 1 Device by Does not apply route daily.   FreeStyle Libre 2 Reader Jane Lew Use to check blood sugar three times daily. E10.8   FreeStyle Libre 2 Sensor Misc Use to check blood sugar three times daily. Change sensor every 2 weeks. E10.8   gabapentin 300 MG capsule Commonly known as: NEURONTIN Take 1 capsule (300 mg total) by mouth daily.   Global Ease Inject Pen Needles 32G X 4 MM Misc Generic drug: Insulin Pen Needle Use as directed with novolog AND lantus   hydrALAZINE 10 MG tablet Commonly known as: APRESOLINE Take 1 tablet (10 mg total) by mouth every 8 (eight) hours.   Insulin Syringes (Disposable) U-100  0.3 ML Misc Use to inject 8 units three times a day   NovoLOG FlexPen 100 UNIT/ML FlexPen Generic drug: insulin aspart Inject 5 Units into the skin 3 (three) times daily with meals. SSI         ALLERGIES: Allergies  Allergen Reactions   Geodon [Ziprasidone Hcl] Other (See Comments)    Severe muscle spasms (ENTIRE BODY)- had to call EMS   Amlodipine Hives and Swelling    Lower extremities     REVIEW OF SYSTEMS: A comprehensive ROS was conducted with the patient and is negative except as per HPI    OBJECTIVE:   VITAL SIGNS: BP (!) 148/84 (BP Location: Left Arm, Patient Position: Sitting, Cuff Size: Large)   Pulse 96   Ht 5\' 11"  (1.803 m)   Wt 178 lb (80.7 kg)   SpO2 99%   BMI 24.83 kg/m    PHYSICAL EXAM:  General: Pt appears well and is in NAD  Neck: General: Supple without adenopathy or carotid bruits. Thyroid: Thyroid size normal.  No goiter or nodules appreciated.   Lungs: Clear with good BS bilat   Heart: RRR   Abdomen:  soft, nontender  Extremities:  Lower extremities - No pretibial edema.   Skin:  No rash noted.  Neuro: MS is good with appropriate affect, pt is alert and Ox3    DM foot exam: 11/15/2022 per PCP    DATA REVIEWED:  Lab Results  Component Value Date   HGBA1C 9.4 (A) 11/15/2022   HGBA1C 8.9 (H) 08/22/2022   HGBA1C 10.0 (H) 11/17/2021    Latest Reference Range & Units 11/19/22 00:43  Sodium 135 - 145 mmol/L 134 (L)  Potassium 3.5 - 5.1 mmol/L 3.8  Chloride 98 - 111 mmol/L 106  CO2 22 - 32 mmol/L 25  Glucose 70 - 99 mg/dL 161 (H)  BUN 6 - 20 mg/dL 25 (H)  Creatinine 0.96 - 1.24 mg/dL 0.45 (H)  Calcium 8.9 - 10.3 mg/dL 8.5 (L)  Anion gap 5 - 15  3 (L)  GFR, Estimated >60 mL/min 52 (L)  (L): Data is abnormally low (H): Data is abnormally high   Old records , labs and images have been reviewed.   ASSESSMENT / PLAN / RECOMMENDATIONS:   1) Type 1 Diabetes Mellitus, poorly controlled, With CKD III , and neuropathic complications -  Most recent A1c of 9.4 %. Goal A1c < 7.0 %.    -Discussed pharmacokinetics of basal/bolus insulin and the importance of taking prandial insulin with meals.  We also discussed avoiding sugar-sweetened beverages and snacks, when possible.  -Patient advised to take NovoLog before the meal rather than after the meal to reduce the risk of hypoglycemia and improve glycemic control -He has been noted with glycemic excursions ranging from 50 >250 mg/dL -He would like to count his carbohydrates -I will reduce his basal insulin, his fasting this morning was 75 Mg/DL and he drank juice -We discussed the proper way for correcting hypoglycemia BG <70 mg/dL -I will also provide him with a correction scale -He is interested in an insulin pump, prescription for OmniPod 5, Dexcom G6, and a referral to our CDE has been placed -He was also provided with a pamphlet to register his pump   MEDICATIONS: Decrease Lantus 12 units daily Change NovoLog I:C ratio 1:14 TIDQAC CF : Novolog (BG-130/50) TID  EDUCATION / INSTRUCTIONS: BG monitoring instructions: Patient is instructed to check his blood sugars 3 times a day, before meals. Call [facility] if: BG persistently < 70  I reviewed the Rule of 15 for the treatment of hypoglycemia in detail with the patient. Literature supplied.   2) Diabetic complications:  Eye: Does not have known diabetic retinopathy.  Neuro/ Feet: Does  have known diabetic peripheral neuropathy. Renal: Patient does  have known baseline CKD. He is not on an ACEI/ARB at present.  3) Dyslipidemia :    -He was on Simvastatin that was disconitnued for no known reason during hospitalization -He denies any side effects to simvastatin -LDL extremely high at 170 mg/DL on 1/61/0960, I have recommended starting statin therapy to reduce risk of CVA and CAD    Medication  Start Rosuvastatin 10 mg daily   Follow-up in 3 months  Signed electronically by: Lyndle Herrlich, MD  [facility] Endocrinology  [facility] [address] [place], [place] [address] Phone: [phone] FAX: [phone]   CC: [name], NP [address] [place] [address] Phone: [phone]  Fax: [phone]    Return to Endocrinology clinic as below: Future Appointments  Date Time Provider Department Center  12/27/2022  9:50 AM [name], NP [facility] None

## 2022-12-11 NOTE — Patient Instructions (Addendum)
Decrease Lantus 12 units daily  Change Novolog to 1 units for every 14 grams of carbohydrates  Novolog correctional insulin: ADD extra units on insulin to your meal-time Novolog dose if your blood sugars are higher than 180. Use the scale below to help guide you:   Blood sugar before meal Number of units to inject  Less than 180 0 unit  181 - 230 1 units  231 - 280 2 units  281 - 330 3 units  331 -  380 4 units  381 -  430 5 units     HOW TO TREAT LOW BLOOD SUGARS (Blood sugar LESS THAN 70 MG/DL) Please follow the RULE OF 15 for the treatment of hypoglycemia treatment (when your (blood sugars are less than 70 mg/dL)   STEP 1: Take 15 grams of carbohydrates when your blood sugar is low, which includes:  3-4 GLUCOSE TABS  OR 3-4 OZ OF JUICE OR REGULAR SODA OR ONE TUBE OF GLUCOSE GEL    STEP 2: RECHECK blood sugar in 15 MINUTES STEP 3: If your blood sugar is still low at the 15 minute recheck --> then, go back to STEP 1 and treat AGAIN with another 15 grams of carbohydrates.

## 2022-12-12 ENCOUNTER — Encounter: Payer: Self-pay | Admitting: Internal Medicine

## 2022-12-13 ENCOUNTER — Other Ambulatory Visit (HOSPITAL_COMMUNITY): Payer: Self-pay

## 2022-12-13 ENCOUNTER — Telehealth: Payer: Self-pay | Admitting: Pharmacy Technician

## 2022-12-13 NOTE — Telephone Encounter (Signed)
Received fax via [facility] for PA request for dexcom G6 sensor & transmitter.  The primary ins that was in [facility] has been termed, but his traditional [place] [employer] says submit to primary. I don't see anything in his media.

## 2022-12-17 ENCOUNTER — Other Ambulatory Visit (HOSPITAL_COMMUNITY): Payer: Self-pay

## 2022-12-17 ENCOUNTER — Telehealth: Payer: Self-pay

## 2022-12-17 NOTE — Telephone Encounter (Signed)
Patient Advocate Encounter   Received notification from pt msgs that prior authorization is required for Dexcom G6 sensor  Submitted: 12/17/22 via [facility] [employer] [phone] [id number]  Status is pending

## 2022-12-17 NOTE — Telephone Encounter (Signed)
Any update on this PA?

## 2022-12-18 ENCOUNTER — Other Ambulatory Visit (INDEPENDENT_AMBULATORY_CARE_PROVIDER_SITE_OTHER): Payer: Self-pay | Admitting: Primary Care

## 2022-12-18 ENCOUNTER — Other Ambulatory Visit (HOSPITAL_COMMUNITY): Payer: Self-pay

## 2022-12-18 NOTE — Telephone Encounter (Signed)
PA was not done for the Transmitters

## 2022-12-18 NOTE — Telephone Encounter (Signed)
Key for transmitters: [id number]

## 2022-12-21 ENCOUNTER — Encounter: Payer: Self-pay | Admitting: Neurology

## 2022-12-21 ENCOUNTER — Other Ambulatory Visit (INDEPENDENT_AMBULATORY_CARE_PROVIDER_SITE_OTHER): Payer: Self-pay | Admitting: Primary Care

## 2022-12-21 ENCOUNTER — Ambulatory Visit (INDEPENDENT_AMBULATORY_CARE_PROVIDER_SITE_OTHER): Payer: 59 | Admitting: Neurology

## 2022-12-21 VITALS — BP 214/113 | HR 72 | Ht 71.0 in | Wt 174.0 lb

## 2022-12-21 DIAGNOSIS — R569 Unspecified convulsions: Secondary | ICD-10-CM

## 2022-12-21 DIAGNOSIS — R200 Anesthesia of skin: Secondary | ICD-10-CM

## 2022-12-21 DIAGNOSIS — M79602 Pain in left arm: Secondary | ICD-10-CM | POA: Diagnosis not present

## 2022-12-21 DIAGNOSIS — R202 Paresthesia of skin: Secondary | ICD-10-CM

## 2022-12-21 DIAGNOSIS — M7989 Other specified soft tissue disorders: Secondary | ICD-10-CM

## 2022-12-21 NOTE — Progress Notes (Unsigned)
NEUROLOGY FOLLOW UP OFFICE NOTE  [name] [id number] Nov 28, 1975  HISTORY OF PRESENT ILLNESS: I had the pleasure of seeing [name] in follow-up in the neurology clinic on 12/21/2022.  The patient was last seen a year ago for seizures. He is alone in the office today. Records and images were personally reviewed where available. He presents for a different symptom. He denies any seizures since August 2020 when he was significantly hypoglycemic. He has been off Depakote for over a year. He notes an episode 2 months ago of leg cramping of the right calf lasting 3-4 hours waking him from sleep, finding he was again hypoglycemic and calling EMS. He presents today for left arm swelling, pain, and paresthesias that started after injury from IV insertion last 11/17/2022 during admission for DKA. He states staff kept trying to get blood from his left arm, then "IV team came in and went too deep." He states "they hit a nerve" and a shock went down his arm, his hand involuntarily went into a fist, and it has been "messed up since then." He has left thumb swelling and pain after he fell off his air mattress on 5/2, but states that the pain started after IV insertion 5/11. There is constant little prickles of sharp pain. If he lifts his arm above his head or moves it down, there is a huge explosion down his left forearm followed by "pricklies" then numbness. This can last 5-7 minutes. He denies any neck pain, no headaches, dizziness. Right side and left leg are unaffected. BP today significantly elevated, he has not picked up clonidine yet. He reports he has not started his insulin pump so glucose is "ridiculous." He has not been sleeping, he has been very stressed out due to family issues. He has now been staying with his son. He had shingles on his chest and back and still has some pain.   Lab Results  Component Value Date   HGBA1C 9.4 (A) 11/15/2022     History on Initial Assessment 06/23/2018: This is a  pleasant 47 year old left-handed man with a history of type I diabetes, hypertension, presenting to establish care for seizures. He reports that seizures started around age 67. All his seizures appear nocturnal, usually occurring early in the morning where he wakes up with a tongue bite and incontinence, or his mother wakes him up. He recalls the worst seizure at age 56 when he bit his tongue so bad and woke up in the ER with right shoulder ?subluxation/dislocation. He states he was told it was not fractured. He has been taking Depakote ER 1500mg  qhs for several years without side effects and does not recall trying other AEDs. His longest seizure-free interval has been 3 years. Last seizure was 04/07/18, he woke up with bedsheets wet on the floor, lightheaded. Previous to this, he recalls a seizure 2 months prior. He lives alone and has not noticed any recent episodes of gaps in time, but in the past would be "out of space" when his sugars would go to 30-40s. Seizure triggers include exhaustion, sleep deprivation, heat, and low blood sugar (30-40). He reports glucose levels have been okay recently. He noticed some hand jerking when younger, none recently. He tastes blood after seizures. He has not been told of any staring/unresponsive episodes. He does not usually have headaches, but recently has been having headaches once a week with diffuse thumping where he has to focus his eyes. No nausea/vomiting, visual obscurations. He denies  any dizziness, diplopia, dysarthria/dysphagia, neck/back pain, bowel/bladder dysfunction. He reinjured his right shoulder a few months ago and now has a sharp sensation and tingling when he pulls his right shoulder back or lifts his arms. He started to notice memory changes a few months ago, he has to write things down or put them on a calendar. MMSE 24/30 at PCP office in October 2019. He states his stress level has been up since April, worse with the shoulder injury. He denies missing  medications or bills. He is planning to go back to school. He has some sleep issues which Trazodone helps with.   Epilepsy Risk Factors:  He is unsure if his mother and a paternal cousin have seizures (he has not personally witnessed any). He had a head injury at age 61 where he lost consciousness and has a scar on the right brow. Otherwise he had a normal birth and early development.  There is no history of febrile convulsions, CNS infections such as meningitis/encephalitis, neurosurgical procedures.    PAST MEDICAL HISTORY: Past Medical History:  Diagnosis Date   Diabetes mellitus    Hypertension    Seizures (HCC)     MEDICATIONS: Current Outpatient Medications on File Prior to Visit  Medication Sig Dispense Refill   Accu-Chek Softclix Lancets lancets Use to check blood sugar up to 6 times daily. 100 each 6   Blood Glucose Monitoring Suppl (ACCU-CHEK GUIDE) w/Device KIT Use to check blood sugar up to 6 times daily. 1 kit 0   Blood Pressure Monitoring ([employer] ADVANCED BP MONITOR) DEVI 1 Device by Does not apply route daily. 1 each 0   Continuous Blood Gluc Receiver (FREESTYLE LIBRE 2 READER) DEVI Use to check blood sugar three times daily. E10.8 1 each 0   Continuous Glucose Sensor (DEXCOM G6 SENSOR) MISC 1 Device by Does not apply route as directed. 9 each 3   Continuous Glucose Transmitter (DEXCOM G6 TRANSMITTER) MISC 1 Device by Does not apply route as directed. 1 each 3   GLOBAL EASE INJECT PEN NEEDLES 32G X 4 MM MISC Use as directed with novolog AND lantus     glucose blood (ACCU-CHEK GUIDE) test strip Use to check blood sugar up to 6 times daily. 100 each 3   hydrALAZINE (APRESOLINE) 10 MG tablet Take 1 tablet (10 mg total) by mouth every 8 (eight) hours. 180 tablet 0   insulin aspart (NOVOLOG FLEXPEN) 100 UNIT/ML FlexPen Inject 5 Units into the skin 3 (three) times daily with meals. SSI 15 mL 0   Insulin Disposable Pump (OMNIPOD 5 G6 INTRO, GEN 5,) KIT 1 Device by Does not apply route  every other day. 1 kit 0   Insulin Disposable Pump (OMNIPOD 5 G6 PODS, GEN 5,) MISC 1 Device by Does not apply route every other day. 45 each 2   insulin glargine (LANTUS SOLOSTAR) 100 UNIT/ML Solostar Pen Inject 10 Units into the skin at bedtime. (Patient taking differently: Inject 10-14 Units into the skin at bedtime.) 15 mL 0   Insulin Syringes, Disposable, U-100 0.3 ML MISC Use to inject 8 units three times a day 100 each 11   rosuvastatin (CRESTOR) 10 MG tablet Take 1 tablet (10 mg total) by mouth daily. 90 tablet 3   cloNIDine (CATAPRES) 0.2 MG tablet Take 1 tablet (0.2 mg total) by mouth daily. (Patient not taking: Reported on 12/21/2022) 90 tablet 0   gabapentin (NEURONTIN) 300 MG capsule Take 1 capsule (300 mg total) by mouth daily. (Patient not taking:  Reported on 12/11/2022) 30 capsule 0   No current facility-administered medications on file prior to visit.    ALLERGIES: Allergies  Allergen Reactions   Geodon [Ziprasidone Hcl] Other (See Comments)    Severe muscle spasms (ENTIRE BODY)- had to call EMS   Amlodipine Hives and Swelling    Lower extremities    FAMILY HISTORY: Family History  Problem Relation Age of Onset   Hypertension Mother    Diabetes Paternal Uncle     SOCIAL HISTORY: Social History   Socioeconomic History   Marital status: Single    Spouse name: Not on file   Number of children: 3   Years of education: Not on file   Highest education level: Some college, no degree  Occupational History   Not on file  Tobacco Use   Smoking status: Never   Smokeless tobacco: Never  Vaping Use   Vaping Use: Never used  Substance and Sexual Activity   Alcohol use: Not Currently    Alcohol/week: 1.0 standard drink of alcohol    Types: 1 Glasses of wine per week    Comment: rarely   Drug use: No   Sexual activity: Not Currently  Other Topics Concern   Not on file  Social History Narrative   Pt is left handed   Lives alone in 1 story home   Has 3 children    Some college education   Worked as Herbalist   Social Determinants of Health   Financial Resource Strain: Medium Risk (11/15/2022)   Overall Financial Resource Strain (CARDIA)    Difficulty of Paying Living Expenses: Somewhat hard  Food Insecurity: Food Insecurity Present (11/17/2022)   Hunger Vital Sign    Worried About Programme researcher, broadcasting/film/video in the Last Year: Often true    Ran Out of Food in the Last Year: Often true  Transportation Needs: Unmet Transportation Needs (11/17/2022)   PRAPARE - Transportation    Lack of Transportation (Medical): Yes    Lack of Transportation (Non-Medical): Yes  Physical Activity: Sufficiently Active (11/15/2022)   Exercise Vital Sign    Days of Exercise per Week: 4 days    Minutes of Exercise per Session: 80 min  Stress: Stress Concern Present (11/15/2022)   [employer] of Occupational Health - Occupational Stress Questionnaire    Feeling of Stress : Very much  Social Connections: Unknown (11/15/2022)   Social Connection and Isolation Panel [NHANES]    Frequency of Communication with Friends and Family: Never    Frequency of Social Gatherings with Friends and Family: Never    Attends Religious Services: Patient declined    Active Member of Clubs or Organizations: No    Attends Engineer, structural: Not on file    Marital Status: Separated  Intimate Partner Violence: Not At Risk (11/17/2022)   Humiliation, Afraid, Rape, and Kick questionnaire    Fear of Current or Ex-Partner: No    Emotionally Abused: No    Physically Abused: No    Sexually Abused: No     PHYSICAL EXAM: Vitals:   12/21/22 1237 12/21/22 1244  BP: (!) 211/98 (!) 214/113  Pulse: 72   SpO2: 99%    General: No acute distress Head:  Normocephalic/atraumatic Skin/Extremities: No rash. Swelling around base of left thumb and slight swelling around left forearm Neurological Exam: alert and awake. No aphasia or dysarthria. Fund of knowledge is appropriate. Attention and  concentration are normal.   Cranial nerves: Pupils equal, round. Extraocular movements intact with no  nystagmus. Visual fields full.  No facial asymmetry.  Motor: Bulk and tone normal, muscle strength 5/5 throughout with pain elicited with certain movements of left arm. Sensation intact to all modalities. Finger to nose testing intact.  Gait narrow-based and steady, able to tandem walk adequately.  Romberg negative.   IMPRESSION: This is a pleasant 47 yo LH man with a history of type I diabetes, hypertension, and seizures that have occurred in the setting of hypoglycemia. He was previously on Depakote but stopped it over a year ago since he felt that all his seizures were related to glucose levels. He continues to have issues with glucose, as well as BP control. Continue follow-up with PCP and Endo. His main concern today is pain and paresthesias on the left arm since repeated attempts at IV insertion last month. EMG/NCV of the left upper extremity will be ordered to further assess symptoms. There is also some swelling in his arm, doppler will be ordered. He had left thumb injury preceding this event and continues to have swelling of his left thumb, refer to Hand Surgery. Follow-up in 3 months, call for any changes.    Thank you for allowing me to participate in his care.  Please do not hesitate to call for any questions or concerns.    Patrcia Dolly, M.D.   CC: Gwinda Passe, NP

## 2022-12-21 NOTE — Telephone Encounter (Signed)
Requested medication (s) are due for refill today: yes  Requested medication (s) are on the active medication list: yes  Last refill:  12/11/22  Future visit scheduled: yes  Notes to clinic:  Unable to refill per protocol, last refill by another provider.      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

## 2022-12-21 NOTE — Telephone Encounter (Signed)
Both were APPROVED but only through 01/06/2023

## 2022-12-21 NOTE — Patient Instructions (Signed)
Good to see you.  Schedule EMG/NCV of the left upper extremity  2. Schedule vascular ultrasound of left arm  3. Referral will be sent to Hand Surgeon for left thumb swelling  4. Continue working on blood pressure and glucose control  5. Follow-up in 3 months, call for any changes

## 2022-12-24 ENCOUNTER — Ambulatory Visit (INDEPENDENT_AMBULATORY_CARE_PROVIDER_SITE_OTHER): Payer: Self-pay | Admitting: *Deleted

## 2022-12-24 ENCOUNTER — Ambulatory Visit: Payer: Medicaid Other | Admitting: Nutrition

## 2022-12-24 ENCOUNTER — Telehealth: Payer: Self-pay | Admitting: Nutrition

## 2022-12-24 ENCOUNTER — Encounter (INDEPENDENT_AMBULATORY_CARE_PROVIDER_SITE_OTHER): Payer: Self-pay

## 2022-12-24 NOTE — Telephone Encounter (Signed)
This encounter was created in error - please disregard.

## 2022-12-24 NOTE — Telephone Encounter (Signed)
  Chief Complaint: chest pain/ HTN Symptoms: mid chest pain (cramping), intermittent, lasts few secs and goes away, had HA fri and sat and BP elevated last checked 12/21/22 214/113 Frequency: several days Pertinent Negatives: Patient denies any current chest pain Disposition: [] ED /[] Urgent Care (no appt availability in office) / [x] Appointment(In office/virtual)/ []  [facility]/ [] Home Care/ [] Refused Recommended Disposition /[] [facility]/ []  Follow-up with PCP  Additional Notes: pt hasn't checked BP since 12/21/22, denies HA or chest pain. Has been taking medications except for clonidine cause he hasn't had money to pick up yet. Pt has OV scheduled for 12/27/22, advised ok to keep OV if chest pain doesn't get worse or more frequent, recommended he go to ED if chest pain gets worse or severe HA or SOB occurs. Pt verbalized understanding.

## 2022-12-24 NOTE — Telephone Encounter (Signed)
Reason for Disposition . [1] Chest pain lasts < 5 minutes AND [2] NO chest pain or cardiac symptoms (e.g., breathing difficulty, sweating) now  (Exception: Chest pains that last only a few seconds.)  Answer Assessment - Initial Assessment Questions 1. LOCATION: "Where does it hurt?"       Mid chest  2. RADIATION: "Does the pain go anywhere else?" (e.g., into neck, jaw, arms, back)     No  3. ONSET: "When did the chest pain begin?" (Minutes, hours or days)      This weekend  4. PATTERN: "Does the pain come and go, or has it been constant since it started?"  "Does it get worse with exertion?"      Comes and goes  5. DURATION: "How long does it last" (e.g., seconds, minutes, hours)     2 secs, comes frequent in 1.5 hr happened 2x 6. SEVERITY: "How bad is the pain?"  (e.g., Scale 1-10; mild, moderate, or severe)    - MILD (1-3): doesn't interfere with normal activities     - MODERATE (4-7): interferes with normal activities or awakens from sleep    - SEVERE (8-10): excruciating pain, unable to do any normal activities       4/5 7. CARDIAC RISK FACTORS: "Do you have any history of heart problems or risk factors for heart disease?" (e.g., angina, prior heart attack; diabetes, high blood pressure, high cholesterol, smoker, or strong family history of heart disease)     HTN HDL 10. OTHER SYMPTOMS: "Do you have any other symptoms?" (e.g., dizziness, nausea, vomiting, sweating, fever, difficulty breathing, cough)       BP 214/113, HA (fri/ sat)  Protocols used: Chest Pain-A-AH

## 2022-12-24 NOTE — Telephone Encounter (Signed)
LVM to call me to schedule pump training 

## 2022-12-24 NOTE — Telephone Encounter (Signed)
Patient called regarding elevated BP. During transfer from agent to NT call disconnected. NT attempted to contact patient back [phone], no answer, LVMTCB [phone].

## 2022-12-25 ENCOUNTER — Encounter: Payer: Self-pay | Admitting: Neurology

## 2022-12-25 ENCOUNTER — Other Ambulatory Visit: Payer: Self-pay

## 2022-12-25 MED ORDER — DEXCOM G6 RECEIVER DEVI: 0 refills | Status: DC

## 2022-12-25 NOTE — Telephone Encounter (Signed)
PA was done and approved in separate encounter.

## 2022-12-26 ENCOUNTER — Other Ambulatory Visit: Payer: Self-pay

## 2022-12-26 ENCOUNTER — Telehealth: Payer: Self-pay | Admitting: Nutrition

## 2022-12-26 NOTE — Progress Notes (Signed)
   [name] [birth year] [id number]  Patient outreached by Alesia Banda , PharmD Candidate on 12/26/22.  Patient is interested in obtaining a BP cuff.  Blood Pressure Readings: Last documented ambulatory systolic blood pressure: 214 Last documented ambulatory diastolic blood pressure: 113 Does the patient have a validated home blood pressure machine?: No They report home readings at [facility] systolics > 180s  Medication review was performed. Is the patient taking their medications as prescribed?: No Differences from their prescribed list include: Patient reports not taking clonidine  The following barriers to adherence were noted: Does the patient have cost concerns?: No Does the patient have transportation concerns?: No Does the patient need assistance obtaining refills?: No Does the patient occassionally forget to take some of their prescribed medications?: Yes Does the patient feel like one/some of their medications make them feel poorly?: Yes Does the patient have questions or concerns about their medications?: Yes Does the patient have a follow up scheduled with their primary care provider/cardiologist?: Yes   Interventions: Interventions Completed: Medications were reviewed, Patient was educated on goal blood pressures and long term health implications of elevated blood pressure, Patient was educated on medications, including indication and administration, Patient was educated on how to access home blood pressure machine, Patient was counseled on lifestyle modifications to improve blood pressure, including  The patient has follow up scheduled:  PCP: [name], NP   Alesia Banda, Student-PharmD

## 2022-12-27 ENCOUNTER — Encounter (INDEPENDENT_AMBULATORY_CARE_PROVIDER_SITE_OTHER): Payer: Self-pay | Admitting: Primary Care

## 2022-12-27 ENCOUNTER — Ambulatory Visit (INDEPENDENT_AMBULATORY_CARE_PROVIDER_SITE_OTHER): Payer: Medicaid Other | Admitting: Primary Care

## 2022-12-27 VITALS — BP 172/83 | HR 94 | Resp 16 | Wt 171.4 lb

## 2022-12-27 DIAGNOSIS — E1022 Type 1 diabetes mellitus with diabetic chronic kidney disease: Secondary | ICD-10-CM

## 2022-12-27 DIAGNOSIS — I129 Hypertensive chronic kidney disease with stage 1 through stage 4 chronic kidney disease, or unspecified chronic kidney disease: Secondary | ICD-10-CM | POA: Diagnosis not present

## 2022-12-27 DIAGNOSIS — N183 Chronic kidney disease, stage 3 unspecified: Secondary | ICD-10-CM | POA: Diagnosis not present

## 2022-12-27 MED ORDER — LISINOPRIL-HYDROCHLOROTHIAZIDE 20-25 MG PO TABS
1.0000 | ORAL_TABLET | Freq: Every day | ORAL | 0 refills | Status: DC
Start: 2022-12-27 — End: 2023-04-01

## 2022-12-30 NOTE — Progress Notes (Signed)
[facility]  [name], is a 47 y.o. male  [id number]  [id number]  DOB - April 27, 1976  Chief Complaint  Patient presents with   Blood Pressure Check       Subjective:   [name] is a 47 y.o. male here today for a follow up visit for a blood pressure check to determine if medication is needed.  After discussion of how he has changed his diet exercise eliminated processed foods and fast foods and blood pressure still elevated went a little bit deeper both parents have hypertension.  Genetics plays a key role in disease processes.  Patient has No headache, No chest pain, No abdominal pain - No Nausea, No new weakness tingling or numbness, No Cough - shortness of breath  No problems updated.  Allergies  Allergen Reactions   Geodon [Ziprasidone Hcl] Other (See Comments)    Severe muscle spasms (ENTIRE BODY)- had to call EMS   Amlodipine Hives and Swelling    Lower extremities    Past Medical History:  Diagnosis Date   Diabetes mellitus    Hypertension    Seizures (HCC)     Current Outpatient Medications on File Prior to Visit  Medication Sig Dispense Refill   Continuous Glucose Receiver (DEXCOM G6 RECEIVER) DEVI Use to monitor blood sugars 1 each 0   Accu-Chek Softclix Lancets lancets Use to check blood sugar up to 6 times daily. 100 each 6   Blood Glucose Monitoring Suppl (ACCU-CHEK GUIDE) w/Device KIT Use to check blood sugar up to 6 times daily. 1 kit 0   Blood Pressure Monitoring ([employer]S ADVANCED BP MONITOR) DEVI 1 Device by Does not apply route daily. 1 each 0   cloNIDine (CATAPRES) 0.2 MG tablet Take 1 tablet (0.2 mg total) by mouth daily. (Patient not taking: Reported on 12/21/2022) 90 tablet 0   Continuous Blood Gluc Receiver (FREESTYLE LIBRE 2 READER) DEVI Use to check blood sugar three times daily. E10.8 1 each 0   Continuous Glucose Sensor (DEXCOM G6 SENSOR) MISC 1 Device by Does not apply route as directed. 9 each 3   Continuous Glucose  Transmitter (DEXCOM G6 TRANSMITTER) MISC 1 Device by Does not apply route as directed. 1 each 3   gabapentin (NEURONTIN) 300 MG capsule Take 1 capsule (300 mg total) by mouth daily. (Patient not taking: Reported on 12/11/2022) 30 capsule 0   GLOBAL EASE INJECT PEN NEEDLES 32G X 4 MM MISC Use as directed with novolog AND lantus 100 each 0   glucose blood (ACCU-CHEK GUIDE) test strip Use to check blood sugar up to 6 times daily. 100 each 3   hydrALAZINE (APRESOLINE) 10 MG tablet Take 1 tablet (10 mg total) by mouth every 8 (eight) hours. 180 tablet 0   insulin aspart (NOVOLOG FLEXPEN) 100 UNIT/ML FlexPen Inject 5 Units into the skin 3 (three) times daily with meals. SSI 15 mL 0   Insulin Disposable Pump (OMNIPOD 5 G6 INTRO, GEN 5,) KIT 1 Device by Does not apply route every other day. 1 kit 0   Insulin Disposable Pump (OMNIPOD 5 G6 PODS, GEN 5,) MISC 1 Device by Does not apply route every other day. 45 each 2   insulin glargine (LANTUS SOLOSTAR) 100 UNIT/ML Solostar Pen Inject 10 Units into the skin at bedtime. (Patient taking differently: Inject 10-14 Units into the skin at bedtime.) 15 mL 0   Insulin Syringes, Disposable, U-100 0.3 ML MISC Use to inject 8 units three times a day 100 each 11  rosuvastatin (CRESTOR) 10 MG tablet Take 1 tablet (10 mg total) by mouth daily. 90 tablet 3   No current facility-administered medications on file prior to visit.    Objective:   Vitals:   12/27/22 0918  BP: (Abnormal) 172/83  Pulse: 94  Resp: 16  SpO2: 97%  Weight: 171 lb 6.4 oz (77.7 kg)    Comprehensive ROS Pertinent positive and negative noted in HPI   Exam General appearance : Awake, alert, not in any distress. Speech Clear. Not toxic looking HEENT: Atraumatic and Normocephalic, pupils equally reactive to light and accomodation Neck: Supple, no JVD. No cervical lymphadenopathy.  Chest: Good air entry bilaterally, no added sounds  CVS: S1 S2 regular, no murmurs.  Abdomen: Bowel sounds  present, Non tender and not distended with no gaurding, rigidity or rebound. Extremities: B/L Lower Ext shows no edema, both legs are warm to touch Neurology: Awake alert, and oriented X 3, CN II-XII intact, Non focal Skin: No Rash  Data Review Lab Results  Component Value Date   HGBA1C 9.4 (A) 11/15/2022   HGBA1C 8.9 (H) 08/22/2022   HGBA1C 10.0 (H) 11/17/2021    Assessment & Plan  [name] was seen today for blood pressure check.  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 BP goal - < 130/80 Explained that having normal blood pressure is the goal and medications are helping to get to goal and maintain normal blood pressure. DIET: Limit salt intake, read nutrition labels to check salt content, limit fried and high fatty foods  Avoid using multisymptom OTC cold preparations that generally contain sudafed which can rise BP. Consult with pharmacist on best cold relief products to use for persons with HTN EXERCISE Discussed incorporating exercise such as walking - 30 minutes most days of the week and can do in 10 minute intervals         Patient have been counseled extensively about nutrition and exercise. Other issues discussed during this visit include: low cholesterol diet, weight control and daily exercise, foot care, annual eye examinations at Ophthalmology, importance of adherence with medications and regular follow-up. We also discussed long term complications of uncontrolled diabetes and hypertension.   No follow-ups on file.  The patient was given clear instructions to go to ER or return to medical center if symptoms don't improve, worsen or new problems develop. The patient verbalized understanding. The patient was told to call to get lab results if they haven't heard anything in the next week.   This note has been created with Biomedical engineer. Any transcriptional errors are unintentional.   [name], NP 12/30/2022, 11:04 PM

## 2023-01-03 ENCOUNTER — Ambulatory Visit (INDEPENDENT_AMBULATORY_CARE_PROVIDER_SITE_OTHER): Payer: Medicaid Other | Admitting: Neurology

## 2023-01-03 DIAGNOSIS — E1042 Type 1 diabetes mellitus with diabetic polyneuropathy: Secondary | ICD-10-CM

## 2023-01-03 DIAGNOSIS — R202 Paresthesia of skin: Secondary | ICD-10-CM | POA: Diagnosis not present

## 2023-01-03 DIAGNOSIS — M79602 Pain in left arm: Secondary | ICD-10-CM

## 2023-01-03 DIAGNOSIS — R200 Anesthesia of skin: Secondary | ICD-10-CM

## 2023-01-03 NOTE — Procedures (Signed)
[facility]  [address] [place], [address]  [place], [place] [address] Tel: [phone] Fax: [phone] Test Date:  01/03/2023  Patient: [name] DOB: 1975/12/09 Physician: Nita Sickle, DO  Sex: Male Height: 5\' 11"  Ref Phys: Patrcia Dolly, MD  ID#: [id number]   Technician:    History: This is a 47 year old man referred for evaluation of left arm paresthesias and pain.  NCV & EMG Findings: Extensive electrodiagnostic testing of the left upper extremity and additional studies of the right shows:  Bilateral median and ulnar sensory responses show prolonged latency (R4.1, L4.1, L3.5 ms) and reduced amplitude (R19.6, L15.1, L11.2 V).  Right ulnar sensory response shows prolonged latency (R3.4 ms) and normal amplitude.  Left radial sensory response shows reduced amplitude (14.9 V).  Right radial sensory response is within normal limits. Left median motor response shows conduction velocity in the forearm.  Left ulnar motor response shows prolonged latency (3.2 ms) and decreased conduction velocity (A Elbow-B Elbow, 37 m/s).  Right median and ulnar motor responses are within normal limits.   Chronic motor axon loss changes are seen affecting the distal muscles left and flexor carpi ulnaris, without accompanying active denervation.    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  Consider nerve ultrasound to further evaluate.   ___________________________ Nita Sickle, DO    Nerve Conduction Studies   Stim Site NR Peak (ms) Norm Peak (ms) O-P Amp (V) Norm O-P Amp  Left Median Anti Sensory (2nd Digit)  32 C  Wrist    *4.1 <3.4 *15.1 >20  Right Median Anti Sensory (2nd Digit)  32 C  Wrist    *4.1 <3.4 *19.6 >20  Left Radial Anti Sensory (Base 1st Digit)  32 C   Wrist    2.3 <2.7 *14.9 >18  Right Radial Anti Sensory (Base 1st Digit)  32 C  Wrist    2.6 <2.7 18.2 >18  Left Ulnar Anti Sensory (5th Digit)  32 C  Wrist    *3.5 <3.1 *11.2 >12  Right Ulnar Anti Sensory (5th Digit)  32 C  Wrist    *3.4 <3.1 24.8 >12     Stim Site NR Onset (ms) Norm Onset (ms) O-P Amp (mV) Norm O-P Amp Site1 Site2 Delta-0 (ms) Dist (cm) Vel (m/s) Norm Vel (m/s)  Left Median Motor (Abd Poll Brev)  32 C  Wrist    3.8 <3.9 7.4 >6 Elbow Wrist 6.6 30.0 *45 >50  Elbow    10.4  6.5         Right Median Motor (Abd Poll Brev)  32 C  Wrist    3.8 <3.9 8.7 >6 Elbow Wrist 5.8 29.0 50 >50  Elbow    9.6  8.0         Left Ulnar Motor (Abd Dig Minimi)  32 C  Wrist    *3.2 <3.1 8.0 >7 B Elbow Wrist 4.3 26.0 60 >50  B Elbow    7.5  4.8  A Elbow B Elbow 2.7 10.0 *37 >50  A Elbow    10.2  1.9         Right Ulnar Motor (Abd Dig Minimi)  32 C  Wrist    3.1 <3.1 7.8 >7 B Elbow Wrist 4.8 24.0 50 >50  B Elbow    7.9  6.7  A Elbow B Elbow 1.8 10.0  56 >50  A Elbow    9.7  6.5          Electromyography   Side Muscle Ins.Act Fibs Fasc Recrt Amp Dur Poly Activation Comment  Left 1stDorInt Nml Nml Nml *3- *1+ *1+ *1+ Nml N/A  Left Abd Poll Brev Nml Nml Nml *1- *1+ *1+ *1+ Nml N/A  Left ABD Dig Min Nml Nml Nml *3- *1+ *1+ *1+ Nml N/A  Left Ext Indicis Nml Nml Nml *1- *1+ *1+ *1+ Nml N/A  Left PronatorTeres Nml Nml Nml Nml Nml Nml Nml Nml N/A  Left Biceps Nml Nml Nml Nml Nml Nml Nml Nml N/A  Left Triceps Nml Nml Nml Nml Nml Nml Nml Nml N/A  Left Deltoid Nml Nml Nml Nml Nml Nml Nml Nml N/A  Left FlexCarpiUln Nml Nml Nml *2- *1+ *1+ *1+ Nml N/A      Waveforms:

## 2023-01-07 ENCOUNTER — Telehealth: Payer: Self-pay | Admitting: Neurology

## 2023-01-07 DIAGNOSIS — R200 Anesthesia of skin: Secondary | ICD-10-CM

## 2023-01-07 NOTE — Telephone Encounter (Signed)
Pt had a EMG done on his upper ext and now the  left hand is numb and tingling. He state that is all of the finger tips that are numb and tingling

## 2023-01-07 NOTE — Telephone Encounter (Signed)
Called patient and discussed EMG results. 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 Discussed that there is no indication of IV stick injury. Discussed doing nerve ultrasound to further evaluate. Heather, pls order left ulnar nerve ultrasound for Dr. Loleta Chance to perform, thanks

## 2023-01-08 NOTE — Addendum Note (Signed)
Addended by: Dimas Chyle on: 01/08/2023 09:15 AM   Modules accepted: Orders

## 2023-01-11 ENCOUNTER — Other Ambulatory Visit (INDEPENDENT_AMBULATORY_CARE_PROVIDER_SITE_OTHER): Payer: Self-pay | Admitting: Primary Care

## 2023-01-14 ENCOUNTER — Encounter: Payer: MEDICAID | Admitting: Nutrition

## 2023-01-14 ENCOUNTER — Other Ambulatory Visit (INDEPENDENT_AMBULATORY_CARE_PROVIDER_SITE_OTHER): Payer: Self-pay | Admitting: Primary Care

## 2023-01-14 NOTE — Telephone Encounter (Signed)
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

## 2023-01-15 ENCOUNTER — Ambulatory Visit: Payer: 59 | Admitting: Nutrition

## 2023-01-15 ENCOUNTER — Telehealth: Payer: Self-pay

## 2023-01-15 NOTE — Telephone Encounter (Signed)
Patient calling stating that pharmacy will not have his Omnipod  until tomorrow or Thursday.

## 2023-01-16 ENCOUNTER — Other Ambulatory Visit (INDEPENDENT_AMBULATORY_CARE_PROVIDER_SITE_OTHER): Payer: Self-pay | Admitting: Primary Care

## 2023-01-16 NOTE — Telephone Encounter (Signed)
Thank you.  Will call him on Monday to schedule pump training

## 2023-01-17 ENCOUNTER — Telehealth: Payer: Self-pay | Admitting: Dietician

## 2023-01-17 ENCOUNTER — Telehealth: Payer: Self-pay

## 2023-01-17 NOTE — Telephone Encounter (Signed)
Patient called to get an appointment for pump training for the Omnipod 5.  He states that he had training arranged earlier but did not have the starter kit.  Appointment made for his training on 01/22/23 at 10 am.  Patient of Donald Penman, MD  Request for Insulin in a vial sent to team.  [name], RD, LDN, CDCES

## 2023-01-18 ENCOUNTER — Other Ambulatory Visit: Payer: Self-pay | Admitting: Internal Medicine

## 2023-01-18 ENCOUNTER — Other Ambulatory Visit (INDEPENDENT_AMBULATORY_CARE_PROVIDER_SITE_OTHER): Payer: Self-pay | Admitting: Internal Medicine

## 2023-01-18 MED ORDER — "INSULIN SYRINGE 28G X 1/2"" 0.5 ML MISC": 1.0000 | Freq: Every day | 3 refills | Status: DC

## 2023-01-18 MED ORDER — INSULIN ASPART 100 UNIT/ML IJ SOLN: INTRAMUSCULAR | 3 refills | Status: DC

## 2023-01-18 NOTE — Telephone Encounter (Signed)
Shayda Kalka Ann Barry [place], CMA  ?

## 2023-01-22 ENCOUNTER — Encounter: Payer: MEDICAID | Attending: "Endocrinology | Admitting: Nutrition

## 2023-01-22 DIAGNOSIS — E785 Hyperlipidemia, unspecified: Secondary | ICD-10-CM | POA: Insufficient documentation

## 2023-01-22 DIAGNOSIS — E1069 Type 1 diabetes mellitus with other specified complication: Secondary | ICD-10-CM | POA: Insufficient documentation

## 2023-01-22 NOTE — Telephone Encounter (Signed)
Appointment scheduled for pump training

## 2023-01-23 ENCOUNTER — Telehealth: Payer: Self-pay | Admitting: Nutrition

## 2023-01-23 NOTE — Telephone Encounter (Signed)
Patient told to call office tomorrow if blood sugars remain over 240.  He agreed to do this and had no final questions for me.

## 2023-01-23 NOTE — Telephone Encounter (Signed)
Patient reported that his blood sugars were running high yesterday (day one) because he forgot to bolus before eating.  and he did a correction dose at [place].  FBS today was about 190.  His blood sugar 2hr. pcL is 234 with 2u of IOB.  Said he had no difficulty giving bolus acL and acB today.  Appt. Scheduled for Tuesday new week to review readings and finish training.

## 2023-01-23 NOTE — Patient Instructions (Signed)
Change dexcom using your phone Change transmitter on both your phone and PDM Read over starter booklet given REad manual on line Call OmniPod if questions or problems with the pump. Call Dexcom if questions about the sensor.

## 2023-01-23 NOTE — Progress Notes (Signed)
[name] was trained on the use of the OmniPod 5 insulin pump.  Settings were put in by the patient per Dr. Harvel Ricks orders. Basal rate: 0.40 (pump does not deliver 0.375), I/C14, ISF: 50, timing 4 hours, max bolus 15, max basal: .8u/hr, target: 120 with correction over 140.  We discussed how this pump delivers insulin.  He is currently wearing the dexcom G6 sensor, and this was linked ot his PDM.  We set up an OmniPod/Podder's central account, and a glooko account and this was linked to [facility]. His dexcom is currently linked to [place] through clarity app. We discussed how this pump delivers insulin and how to give a bolus, making sure to put in blood sugar readings with every bolus, and the need to bolus for all meals and snacks.  He reported good understanding of this and redemonstrated X2 how to give a bolus correctly. We discussed all other topics on the checklist and he had no final questions.  His carb counting appears to be good, but recommended he download the clarity app for fast food places he frequents.  He agreed to do this.  He signed the checklist of topics as understanding with no final questions .

## 2023-01-29 ENCOUNTER — Encounter: Payer: Self-pay | Admitting: Neurology

## 2023-01-29 ENCOUNTER — Other Ambulatory Visit: Payer: MEDICAID | Admitting: Neurology

## 2023-01-29 ENCOUNTER — Ambulatory Visit: Payer: MEDICAID | Admitting: Nutrition

## 2023-01-29 ENCOUNTER — Telehealth: Payer: Self-pay | Admitting: Neurology

## 2023-01-29 NOTE — Telephone Encounter (Signed)
[facility] and V[facility] called needing a PA for ultra sound.  [phone] Nicole Cella.

## 2023-01-29 NOTE — Telephone Encounter (Signed)
Called and spoke to [name] and she stated that a order was placed on 12/21/22 for a Vascular Upper Extremity Duplex Ultrasound. Ordered by Dr. Karel Jarvis. A prior Authorization is needed.

## 2023-01-29 NOTE — Telephone Encounter (Signed)
Found order needing prior authorization. Will update once completed and contact Dorothy once done.

## 2023-01-30 NOTE — Telephone Encounter (Signed)
No PA required for Vascular U/S [id number].  Caryl Pina at [phone] and left a detailed message informed her that patient did not require a prior authorization. Left patient info and our contact information incase she had any questions or concerns.

## 2023-02-05 ENCOUNTER — Ambulatory Visit (HOSPITAL_COMMUNITY)
Admission: RE | Admit: 2023-02-05 | Discharge: 2023-02-05 | Disposition: A | Discharge status: Home / Self Care | Payer: MEDICAID | Source: Ambulatory Visit | Attending: Neurology | Admitting: Neurology

## 2023-02-05 DIAGNOSIS — M79602 Pain in left arm: Secondary | ICD-10-CM | POA: Diagnosis present

## 2023-02-05 DIAGNOSIS — M7989 Other specified soft tissue disorders: Secondary | ICD-10-CM | POA: Insufficient documentation

## 2023-02-05 DIAGNOSIS — R202 Paresthesia of skin: Secondary | ICD-10-CM | POA: Diagnosis not present

## 2023-02-05 DIAGNOSIS — R200 Anesthesia of skin: Secondary | ICD-10-CM | POA: Insufficient documentation

## 2023-02-05 NOTE — Progress Notes (Signed)
Left upper extremity venous duplex completed. Please see CV Procedures for preliminary results.  Shona Simpson, RVT 02/05/23 3:46 PM

## 2023-02-07 ENCOUNTER — Other Ambulatory Visit: Payer: Self-pay | Admitting: Family Medicine

## 2023-02-13 ENCOUNTER — Telehealth: Payer: Self-pay

## 2023-02-13 NOTE — Telephone Encounter (Signed)
Pt called informed  ultrasound did not show any blood vessel abnormality. Proceed with nerve ultrasound as scheduled with Dr. Loleta Chance,

## 2023-02-13 NOTE — Telephone Encounter (Signed)
-----   Message from Van Clines sent at 02/13/2023  9:39 AM EDT ----- Pls let him know the ultrasound did not show any blood vessel abnormality. Proceed with nerve ultrasound as scheduled with Dr. Loleta Chance, thanks

## 2023-02-19 ENCOUNTER — Other Ambulatory Visit: Payer: MEDICAID | Admitting: Neurology

## 2023-02-19 ENCOUNTER — Encounter: Payer: Self-pay | Admitting: Neurology

## 2023-02-25 ENCOUNTER — Other Ambulatory Visit: Payer: Self-pay

## 2023-03-04 ENCOUNTER — Telehealth: Payer: Self-pay | Admitting: Neurology

## 2023-03-04 NOTE — Telephone Encounter (Signed)
Patient called back stating that he gets a rash everytime he is in the sunlight all over his body, patient also states he has pain in his arm and leg

## 2023-03-05 ENCOUNTER — Telehealth: Payer: Self-pay

## 2023-03-05 ENCOUNTER — Other Ambulatory Visit: Payer: MEDICAID | Admitting: Neurology

## 2023-03-05 NOTE — Telephone Encounter (Signed)
Patient having some trouble with his PDM and needs some assistance. Patient also advised to contact Omnipod as well.

## 2023-03-05 NOTE — Telephone Encounter (Signed)
Yes pls, thanks! 

## 2023-03-06 ENCOUNTER — Telehealth: Payer: Self-pay | Admitting: Dietician

## 2023-03-06 NOTE — Telephone Encounter (Signed)
Pt called an advised that he needs to call his PCP about he gets a rash everytime he is in the sunlight all over his body, patient also states he has pain in his arm and leg

## 2023-03-06 NOTE — Telephone Encounter (Signed)
Called patient.  His PDM is not working after he tried to update this.  His POD has expired and he is unable to start a new POD. He has called Omnipod Support and was not able to connect with a person.  I have called Omnipod and we have had a 3 way call.   They will be sending him a new pdm in 48 hours. Patient states that he has the information he needs to restart his new pdm. He is currently on his previous injection schedule with Lantus and Novolog. Patient to call for further questions.  Reminded patient of the 24/7 Omnipod support number as well.  [name], RD, LDN, CDCES

## 2023-03-25 ENCOUNTER — Ambulatory Visit (INDEPENDENT_AMBULATORY_CARE_PROVIDER_SITE_OTHER): Payer: Self-pay | Admitting: *Deleted

## 2023-03-25 ENCOUNTER — Encounter: Payer: Self-pay | Admitting: Neurology

## 2023-03-25 ENCOUNTER — Ambulatory Visit: Payer: MEDICAID | Admitting: Neurology

## 2023-03-25 NOTE — Telephone Encounter (Signed)
  Chief Complaint: Breaks out in a rash/hives when exposed to the heat. Symptoms: Does not ever sweat. Frequency: For the last couple of months Pertinent Negatives: Patient denies having this problem before Disposition: [] ED /[] Urgent Care (no appt availability in office) / [x] Appointment(In office/virtual)/ []  [place] [facility]/ [] Home Care/ [] Refused Recommended Disposition /[] [place] [facility]/ []  Follow-up with PCP Additional Notes: Appt made for 04/01/2023 at 3:10 with Gwinda Passe, NP

## 2023-03-25 NOTE — Telephone Encounter (Signed)
Reason for Disposition  [1] Hives has occurred 3 or more times in the last year AND [2] the cause was not found  Answer Assessment - Initial Assessment Questions 1. APPEARANCE: "What does the rash look like?"      I'm broke out in hives all over and I think it's from the heat.   I get like this when I'm hot.   My scalp and face even itches. I went to [employer] yesterday and there was heat on in places.   I was under the duct and I felt the heat and my face was red and I started itching.  I could feel the hives on my neck.   I was itching was everywhere. The last couple of months this has started happening.   2. LOCATION: "Where is the rash located?"      I don't have any hives right now.   No rash 3. NUMBER: "How many hives are there?"      None right now 4. SIZE: "How big are the hives?" (inches, cm, compare to coins) "Do they all look the same or is there lots of variation in shape and size?"      None 5. ONSET: "When did the hives begin?" (Hours or days ago)      Past couple of months 6. ITCHING: "Does it itch?" If Yes, ask: "How bad is the itch?"    - MILD: doesn't interfere with normal activities   - MODERATE-SEVERE: interferes with work, school, sleep, or other activities      I itch all over when it happens. 7. RECURRENT PROBLEM: "Have you had hives before?" If Yes, ask: "When was the last time?" and "What happened that time?"      No.    8. TRIGGERS: "Were you exposed to any new food, plant, cosmetic product or animal just before the hives began?"     The heat causes this.   When the heat hits me it bothers me.     I never sweat no matter how hot I get.   9. OTHER SYMPTOMS: "Do you have any other symptoms?" (e.g., fever, tongue swelling, difficulty breathing, abdomen pain)     No sweating when I'm hot and I get dizzy then the bumps came up and I was itching. 10. PREGNANCY: "Is there any chance you are pregnant?" "When was your last menstrual period?"       N/A  Protocols used:  Hives-A-AH

## 2023-04-01 ENCOUNTER — Encounter (INDEPENDENT_AMBULATORY_CARE_PROVIDER_SITE_OTHER): Payer: Self-pay | Admitting: Primary Care

## 2023-04-01 ENCOUNTER — Ambulatory Visit (INDEPENDENT_AMBULATORY_CARE_PROVIDER_SITE_OTHER): Payer: MEDICAID | Admitting: Primary Care

## 2023-04-01 VITALS — BP 181/92 | HR 73 | Resp 16 | Wt 178.4 lb

## 2023-04-01 DIAGNOSIS — M79602 Pain in left arm: Secondary | ICD-10-CM | POA: Diagnosis not present

## 2023-04-01 DIAGNOSIS — T679XXA Effect of heat and light, unspecified, initial encounter: Secondary | ICD-10-CM

## 2023-04-01 DIAGNOSIS — I152 Hypertension secondary to endocrine disorders: Secondary | ICD-10-CM

## 2023-04-01 MED ORDER — LISINOPRIL-HYDROCHLOROTHIAZIDE 20-25 MG PO TABS
1.0000 | ORAL_TABLET | Freq: Every day | ORAL | 1 refills | Status: DC
Start: 2023-04-01 — End: 2023-12-11

## 2023-04-01 NOTE — Progress Notes (Signed)
[facility]  [name], is a 47 y.o. male  MVH:[id number]  [id number]:[id number]  DOB - April 17, 1976  Chief Complaint  Patient presents with   Rash       Subjective:   [name] is a 47 y.o. male here today for a acute visit.  Patient explains it was hot out side however he has never experience this before.- 2-3 o clock in the morning started sweating and itching. Takes a shower cools off feels better as if never happen. Patient has No headache, No chest pain, No abdominal pain - No Nausea, No new weakness tingling or numbness, No Cough - shortness of breath  No problems updated.  Allergies  Allergen Reactions   Geodon [Ziprasidone Hcl] Other (See Comments)    Severe muscle spasms (ENTIRE BODY)- had to call EMS   Amlodipine Hives and Swelling    Lower extremities    Past Medical History:  Diagnosis Date   Diabetes mellitus    Hypertension    Seizures (HCC)     Current Outpatient Medications on File Prior to Visit  Medication Sig Dispense Refill   Continuous Glucose Receiver (DEXCOM G6 RECEIVER) DEVI Use to monitor blood sugars 1 each 0   insulin aspart (NOVOLOG) 100 UNIT/ML injection Max daily 50 units 50 mL 3   Insulin Syringe-Needle U-100 (INSULIN SYRINGE .5CC/28G) 28G X 1/2" 0.5 ML MISC 1 Device by Does not apply route daily in the afternoon. 50 each 3   Accu-Chek Softclix Lancets lancets Use to check blood sugar up to 6 times daily. 100 each 6   Blood Glucose Monitoring Suppl (ACCU-CHEK GUIDE) w/Device KIT Use to check blood sugar up to 6 times daily. 1 kit 0   Blood Pressure Monitoring ([employer]S ADVANCED BP MONITOR) DEVI 1 Device by Does not apply route daily. 1 each 0   cloNIDine (CATAPRES) 0.2 MG tablet Take 1 tablet (0.2 mg total) by mouth daily. (Patient not taking: Reported on 12/21/2022) 90 tablet 0   Continuous Blood Gluc Receiver (FREESTYLE LIBRE 2 READER) DEVI Use to check blood sugar three times daily. E10.8 1 each 0   Continuous Glucose Sensor  (DEXCOM G6 SENSOR) MISC 1 Device by Does not apply route as directed. 9 each 3   Continuous Glucose Transmitter (DEXCOM G6 TRANSMITTER) MISC 1 Device by Does not apply route as directed. 1 each 3   gabapentin (NEURONTIN) 300 MG capsule Take 1 capsule (300 mg total) by mouth daily. (Patient not taking: Reported on 12/11/2022) 30 capsule 0   glucose blood (ACCU-CHEK GUIDE) test strip USE TO test blood sugar six times EVERY DAY AS DIRECTED 100 strip 3   hydrALAZINE (APRESOLINE) 10 MG tablet Take 1 tablet (10 mg total) by mouth every 8 (eight) hours. 180 tablet 0   Insulin Disposable Pump (OMNIPOD 5 G6 INTRO, GEN 5,) KIT 1 Device by Does not apply route every other day. 1 kit 0   Insulin Disposable Pump (OMNIPOD 5 G6 PODS, GEN 5,) MISC 1 Device by Does not apply route every other day. 45 each 2   insulin glargine (LANTUS SOLOSTAR) 100 UNIT/ML Solostar Pen Inject 10 Units into the skin at bedtime. (Patient taking differently: Inject 10-14 Units into the skin at bedtime.) 15 mL 0   Insulin Pen Needle (GLOBAL EASE INJECT PEN NEEDLES) 32G X 4 MM MISC Use as directed with novolog AND lantus 100 each 3   Insulin Syringes, Disposable, U-100 0.3 ML MISC Use to inject 8 units three times  a day 100 each 11   rosuvastatin (CRESTOR) 10 MG tablet Take 1 tablet (10 mg total) by mouth daily. 90 tablet 3   No current facility-administered medications on file prior to visit.    Objective:  BP (!) 181/92 (BP Location: Right Arm, Patient Position: Sitting, Cuff Size: Normal)   Pulse 73   Resp 16   Wt 178 lb 6.4 oz (80.9 kg)   SpO2 100%   BMI 24.88 kg/m     Comprehensive ROS Pertinent positive and negative noted in HPI   Exam General appearance : Awake, alert, not in any distress. Speech Clear. Not toxic looking HEENT: Atraumatic and Normocephalic, pupils equally reactive to light and accomodation Neck: Supple, no JVD. No cervical lymphadenopathy.  Chest: Good air entry bilaterally, no added sounds  CVS: S1 S2  regular, no murmurs.  Abdomen: Bowel sounds present, Non tender and not distended with no gaurding, rigidity or rebound. Extremities: B/L Lower Ext shows no edema, both legs are warm to touch Neurology: Awake alert, and oriented X 3, CN II-XII intact, Non focal Skin: No Rash  Data Review Lab Results  Component Value Date   HGBA1C 9.4 (A) 11/15/2022   HGBA1C 8.9 (H) 08/22/2022   HGBA1C 10.0 (H) 11/17/2021    Assessment & Plan   [name] was seen today for rash.  Diagnoses and all orders for this visit:  Left arm pain During ED visit to admission per patient 7 nurse tried to start an IV. The IV team was called she made # 7 and was not successful. Another nurse started IV in his left hand. 2 days after d/c he noticed swelling and pain. He went to work unable to use the Scientist, forensic sent him home.Due to elbow to hand being swollen. He has been followed and imaged next appt scheduled with Dr. Oleta Mouse.  Left arm pain   Hypertension due to endocrine disorder BP goal - < 130/80-  Did not take medications Explained that having normal blood pressure is the goal and medications are helping to get to goal and maintain normal blood pressure. DIET: Limit salt intake, read nutrition labels to check salt content, limit fried and high fatty foods  Avoid using multisymptom OTC cold preparations that generally contain sudafed which can rise BP. Consult with pharmacist on best cold relief products to use for persons with HTN EXERCISE Discussed incorporating exercise such as walking - 30 minutes most days of the week and can do in 10 minute intervals     Heat effects, initial encounter Heat intolerance when over heated becomes dizzy and breaks out in hives- asked to read bottles of medication that warn about sun exposure.      Patient have been counseled extensively about nutrition and exercise. Other issues discussed during this visit include: low cholesterol diet, weight control and daily exercise,  foot care, annual eye examinations at Ophthalmology, importance of adherence with medications and regular follow-up. We also discussed long term complications of uncontrolled diabetes and hypertension.   No follow-ups on file.  The patient was given clear instructions to go to ER or return to medical center if symptoms don't improve, worsen or new problems develop. The patient verbalized understanding. The patient was told to call to get lab results if they haven't heard anything in the next week.   This note has been created with Education officer, environmental. Any transcriptional errors are unintentional.   [name], NP 04/06/2023, 10:24 AM

## 2023-04-10 ENCOUNTER — Ambulatory Visit (INDEPENDENT_AMBULATORY_CARE_PROVIDER_SITE_OTHER): Payer: Self-pay

## 2023-04-10 ENCOUNTER — Telehealth (INDEPENDENT_AMBULATORY_CARE_PROVIDER_SITE_OTHER): Payer: Self-pay

## 2023-04-10 NOTE — Telephone Encounter (Signed)
Will forward to provider  

## 2023-04-10 NOTE — Telephone Encounter (Signed)
Copied from CRM [id number]. Topic: Referral - Request for Referral >> Apr 10, 2023  2:16 PM Franchot Heidelberg wrote: Reason for CRM: Pt called to report that he needs a new referral for neurology, he has missed appts because of his symptoms. Transferred to NT

## 2023-04-10 NOTE — Telephone Encounter (Signed)
Chief Complaint: Hives Symptoms: severe itching  Frequency: comes and goes  Pertinent Negatives: Patient denies visible rash Disposition: [] ED /[] Urgent Care (no appt availability in office) / [] Appointment(In office/virtual)/ []  [place] [facility]/ [x] Home Care/ [] Refused Recommended Disposition /[] [place] [facility]/ []  Follow-up with PCP Additional Notes: Patient states he continues to experience severe itching when exposed to high temperatures. Patient denies current symptoms but states when it happens it is hard to get under control. Care advice was given. Patient also stated that he has miss multiple appointments with the Neurologist due to left arm swelling/pain and severe itching. Patient was advised that the Neurology office has dismissed him from the practice at this time. Patient requesting a referral to see a new Neurologist. PCP is aware of severe itching and arm swelling, patient seen by PCP on 04/01/23 where both issues were addressed. Sending message to PCP to request new referral. Reason for Disposition  Widespread hives  Answer Assessment - Initial Assessment Questions 1. APPEARANCE: "What does the rash look like?"      No bumps seen 2. LOCATION: "Where is the rash located?"      In my head 3. NUMBER: "How many hives are there?"      I'm not sure 4. SIZE: "How big are the hives?" (inches, cm, compare to coins) "Do they all look the same or is there lots of variation in shape and size?"      Not noticeable 5. ONSET: "When did the hives begin?" (Hours or days ago)      On Going over 3 months 6. ITCHING: "Does it itch?" If Yes, ask: "How bad is the itch?"    - MILD: doesn't interfere with normal activities   - MODERATE-SEVERE: interferes with work, school, sleep, or other activities      Severe 7. RECURRENT PROBLEM: "Have you had hives before?" If Yes, ask: "When was the last time?" and "What happened that time?"      Yes,  8. TRIGGERS: "Were you exposed to any new  food, plant, cosmetic product or animal just before the hives began?"     Heat  9. OTHER SYMPTOMS: "Do you have any other symptoms?" (e.g., fever, tongue swelling, difficulty breathing, abdomen pain)     Dizzy sometimes  Protocols used: Hives-A-AH

## 2023-04-11 ENCOUNTER — Encounter (INDEPENDENT_AMBULATORY_CARE_PROVIDER_SITE_OTHER): Payer: Self-pay | Admitting: Primary Care

## 2023-04-11 ENCOUNTER — Other Ambulatory Visit (INDEPENDENT_AMBULATORY_CARE_PROVIDER_SITE_OTHER): Payer: Self-pay | Admitting: Primary Care

## 2023-04-11 DIAGNOSIS — R200 Anesthesia of skin: Secondary | ICD-10-CM

## 2023-04-11 DIAGNOSIS — M79602 Pain in left arm: Secondary | ICD-10-CM

## 2023-04-11 DIAGNOSIS — G40909 Epilepsy, unspecified, not intractable, without status epilepticus: Secondary | ICD-10-CM

## 2023-04-15 ENCOUNTER — Ambulatory Visit (INDEPENDENT_AMBULATORY_CARE_PROVIDER_SITE_OTHER): Payer: MEDICAID

## 2023-04-15 ENCOUNTER — Telehealth: Payer: Self-pay | Admitting: Internal Medicine

## 2023-04-15 ENCOUNTER — Other Ambulatory Visit: Payer: Self-pay

## 2023-04-15 ENCOUNTER — Other Ambulatory Visit: Payer: Self-pay | Admitting: Endocrinology

## 2023-04-15 MED ORDER — INSULIN ASPART 100 UNIT/ML IJ SOLN
INTRAMUSCULAR | 0 refills | Status: DC
  Filled 2023-04-15: qty 50, 30d supply, fill #0
  Filled 2023-04-15: qty 40, 80d supply, fill #0

## 2023-04-15 NOTE — Telephone Encounter (Signed)
Patient would like to use the [employer] [facility] downstairs [address] [place])

## 2023-04-15 NOTE — Telephone Encounter (Signed)
MEDICATION:  insulin aspart insulin aspart (NOVOLOG) 100 UNIT/ML injection  PHARMACY:  [facility] - [place], [place] - [address] (Ph: [phone]   HAS THE PATIENT CONTACTED THEIR PHARMACY?  Yes  IS THIS A 90 DAY SUPPLY : Yes  IS PATIENT OUT OF MEDICATION: Yes  IF NOT; HOW MUCH IS LEFT:   LAST APPOINTMENT DATE: @6 /10/2022  NEXT APPOINTMENT DATE:@11 /19/2024  DO WE HAVE YOUR PERMISSION TO LEAVE A DETAILED MESSAGE?:Yes  OTHER COMMENTS: Patient states that he is out of Novolog & he went to the pharmacy & they are closed today & he wants to know what he should do.   **Let patient know to contact pharmacy at the end of the day to make sure medication is ready. **  ** Please notify patient to allow 48-72 hours to process**  **Encourage patient to contact the pharmacy for refills or they can request refills through [facility]**

## 2023-04-15 NOTE — Telephone Encounter (Signed)
Patient would like to pick up sample of Novolog

## 2023-04-16 ENCOUNTER — Ambulatory Visit (INDEPENDENT_AMBULATORY_CARE_PROVIDER_SITE_OTHER): Payer: MEDICAID

## 2023-04-16 VITALS — BP 111/71

## 2023-04-16 DIAGNOSIS — Z0130 Encounter for examination of blood pressure without abnormal findings: Secondary | ICD-10-CM

## 2023-04-16 NOTE — Progress Notes (Unsigned)
Blood Pressure Recheck Visit  Name: [name] MRN: [id number] Date of Birth: 28-Jun-1976  [name] presents today for Blood Pressure recheck with clinical support staff.    BP Readings from Last 3 Encounters:  04/16/23 111/71  04/01/23 (!) 181/92  12/27/22 (!) 172/83    Current Outpatient Medications  Medication Sig Dispense Refill   Continuous Glucose Receiver (DEXCOM G6 RECEIVER) DEVI Use to monitor blood sugars 1 each 0   Insulin Syringe-Needle U-100 (INSULIN SYRINGE .5CC/28G) 28G X 1/2" 0.5 ML MISC 1 Device by Does not apply route daily in the afternoon. 50 each 3   Accu-Chek Softclix Lancets lancets Use to check blood sugar up to 6 times daily. 100 each 6   Blood Glucose Monitoring Suppl (ACCU-CHEK GUIDE) w/Device KIT Use to check blood sugar up to 6 times daily. 1 kit 0   Blood Pressure Monitoring ([employer]S ADVANCED BP MONITOR) DEVI 1 Device by Does not apply route daily. 1 each 0   cloNIDine (CATAPRES) 0.2 MG tablet Take 1 tablet (0.2 mg total) by mouth daily. (Patient not taking: Reported on 12/21/2022) 90 tablet 0   Continuous Blood Gluc Receiver (FREESTYLE LIBRE 2 READER) DEVI Use to check blood sugar three times daily. E10.8 1 each 0   Continuous Glucose Sensor (DEXCOM G6 SENSOR) MISC 1 Device by Does not apply route as directed. 9 each 3   Continuous Glucose Transmitter (DEXCOM G6 TRANSMITTER) MISC 1 Device by Does not apply route as directed. 1 each 3   gabapentin (NEURONTIN) 300 MG capsule Take 1 capsule (300 mg total) by mouth daily. (Patient not taking: Reported on 12/11/2022) 30 capsule 0   glucose blood (ACCU-CHEK GUIDE) test strip USE TO test blood sugar six times EVERY DAY AS DIRECTED 100 strip 3   hydrALAZINE (APRESOLINE) 10 MG tablet Take 1 tablet (10 mg total) by mouth every 8 (eight) hours. 180 tablet 0   insulin aspart (NOVOLOG) 100 UNIT/ML injection Max daily 50 units 50 mL 0   Insulin Disposable Pump (OMNIPOD 5 G6 INTRO, GEN 5,) KIT 1 Device by Does not apply  route every other day. 1 kit 0   Insulin Disposable Pump (OMNIPOD 5 G6 PODS, GEN 5,) MISC 1 Device by Does not apply route every other day. 45 each 2   insulin glargine (LANTUS SOLOSTAR) 100 UNIT/ML Solostar Pen Inject 10 Units into the skin at bedtime. (Patient taking differently: Inject 10-14 Units into the skin at bedtime.) 15 mL 0   Insulin Pen Needle (GLOBAL EASE INJECT PEN NEEDLES) 32G X 4 MM MISC Use as directed with novolog AND lantus 100 each 3   Insulin Syringes, Disposable, U-100 0.3 ML MISC Use to inject 8 units three times a day 100 each 11   lisinopril-hydrochlorothiazide (ZESTORETIC) 20-25 MG tablet Take 1 tablet by mouth daily. 90 tablet 1   rosuvastatin (CRESTOR) 10 MG tablet Take 1 tablet (10 mg total) by mouth daily. 90 tablet 3   No current facility-administered medications for this visit.    Hypertensive Medication Review: Patient states that they are taking all their hypertensive medications as prescribed and their last dose of hypertensive medications was this morning   Documentation of any medication adherence discrepancies: No  Provider Recommendation:  Spoke to [facility] and they stated: great job. Keep taking all medications and will see you at follow up appointment  Patient has been scheduled to follow up with michelle   Patient has been given provider's recommendations and does not have any  questions or concerns at this time. Patient will contact the office for any future questions or concerns.

## 2023-04-19 ENCOUNTER — Telehealth (INDEPENDENT_AMBULATORY_CARE_PROVIDER_SITE_OTHER): Payer: Self-pay

## 2023-04-19 NOTE — Telephone Encounter (Signed)
Copied from CRM [id number]. Topic: Referral - Request for Referral >> Apr 19, 2023 11:11 AM Franchot Heidelberg wrote: Has patient seen PCP for this complaint? Yes.   *If NO, is insurance requiring patient see PCP for this issue before PCP can refer them? Referral for which specialty: Nephrology  Preferred provider/office: Highest recommended  Reason for referral: Recently made aware of his Chronic Kidney Disease.

## 2023-04-23 ENCOUNTER — Other Ambulatory Visit (INDEPENDENT_AMBULATORY_CARE_PROVIDER_SITE_OTHER): Payer: Self-pay | Admitting: Primary Care

## 2023-04-23 DIAGNOSIS — N1831 Chronic kidney disease, stage 3a: Secondary | ICD-10-CM

## 2023-05-07 ENCOUNTER — Telehealth: Payer: Self-pay | Admitting: Internal Medicine

## 2023-05-07 MED ORDER — OMNIPOD 5 DEXG7G6 PODS GEN 5 MISC: 1.0000 | 2 refills | Status: DC

## 2023-05-07 NOTE — Telephone Encounter (Signed)
Refill sent to [facility] per request.   Requested Prescriptions   Signed Prescriptions Disp Refills   Insulin Disposable Pump (OMNIPOD 5 DEXG7G6 PODS GEN 5) MISC 45 each 2    Sig: 1 Device by Does not apply route every other day.    Authorizing Provider: Orland Penman    Ordering User: Pollie Meyer

## 2023-05-07 NOTE — Telephone Encounter (Signed)
MEDICATION: Omnipod 5 DexG7G6 Pods   PHARMACY:  [facility] Address: [address], [place], [place] [address]  Phone: [phone]  HAS THE PATIENT CONTACTED THEIR PHARMACY?  Yes  IS THIS A 90 DAY SUPPLY : Yes  IS PATIENT OUT OF MEDICATION: Yes  IF NOT; HOW MUCH IS LEFT:   LAST APPOINTMENT DATE: @6 /10/2022  NEXT APPOINTMENT DATE:@11 /19/2024  DO WE HAVE YOUR PERMISSION TO LEAVE A DETAILED MESSAGE?: Yes  OTHER COMMENTS: Patient states that he needs a new prescription   **Let patient know to contact pharmacy at the end of the day to make sure medication is ready. **  ** Please notify patient to allow 48-72 hours to process**  **Encourage patient to contact the pharmacy for refills or they can request refills through [facility]**

## 2023-05-08 ENCOUNTER — Ambulatory Visit (INDEPENDENT_AMBULATORY_CARE_PROVIDER_SITE_OTHER): Payer: Self-pay

## 2023-05-08 NOTE — Telephone Encounter (Signed)
Chief Complaint: Hives Symptoms: red, bumps, itching, dry skin  Frequency: comes and goes Pertinent Negatives: Patient denies tongue swelling, fever, nausea, vomiting Disposition: [] ED /[] Urgent Care (no appt availability in office) / [x] Appointment(In office/virtual)/ []  [facility]/ [] Home Care/ [] Refused Recommended Disposition /[] [facility]/ []  Follow-up with PCP Additional Notes: Patient states when ever his body temperature is slightly elevated he breaks out in hives that is red, bumpy and very itchy. Patient stated it happened yesterday after working out, he was able to take a cool shower to relieve the symptoms but he is not sure why this continues to happen to him when it is exposed to warmer temperatures. Patient states he has not antihistamines yet. Patient denies a rash or hives currently. Care advice was given and patient has an appointment scheduled 05/28/23 with PCP. Called disconnected before I could ask if patient would like an earlier appointment. Called patient back no answer. Voicemail left if he has any additional questions advised to call the office back.   Reason for Disposition  [1] Hives has occurred 3 or more times in the last year AND [2] the cause was not found  Answer Assessment - Initial Assessment Questions 1. APPEARANCE: "What does the rash look like?"      Red and bumps  2. LOCATION: "Where is the rash located?"      It was all over but it is gone now 3. NUMBER: "How many hives are there?"      Covering most of my body  4. SIZE: "How big are the hives?" (inches, cm, compare to coins) "Do they all look the same or is there lots of variation in shape and size?"      Variation in size  5. ONSET: "When did the hives begin?" (Hours or days ago)      Yesterday morning  6. ITCHING: "Does it itch?" If Yes, ask: "How bad is the itch?"    - MILD: doesn't interfere with normal activities   - MODERATE-SEVERE: interferes with work, school, sleep, or  other activities      Moderate  7. RECURRENT PROBLEM: "Have you had hives before?" If Yes, ask: "When was the last time?" and "What happened that time?"      Yes, it happens when my body over heats  8. TRIGGERS: "Were you exposed to any new food, plant, cosmetic product or animal just before the hives began?"     Increased body temperature  9. OTHER SYMPTOMS: "Do you have any other symptoms?" (e.g., fever, tongue swelling, difficulty breathing, abdomen pain)     My skin feels really hot everywhere, dizzy  Protocols used: Hives-A-AH

## 2023-05-09 NOTE — Telephone Encounter (Signed)
Will forward to provider  

## 2023-05-10 ENCOUNTER — Other Ambulatory Visit (INDEPENDENT_AMBULATORY_CARE_PROVIDER_SITE_OTHER): Payer: Self-pay | Admitting: Primary Care

## 2023-05-10 ENCOUNTER — Telehealth (INDEPENDENT_AMBULATORY_CARE_PROVIDER_SITE_OTHER): Payer: Self-pay | Admitting: Primary Care

## 2023-05-10 DIAGNOSIS — L500 Allergic urticaria: Secondary | ICD-10-CM

## 2023-05-10 MED ORDER — HYDROXYZINE HCL 10 MG PO TABS
10.0000 mg | ORAL_TABLET | Freq: Three times a day (TID) | ORAL | 0 refills | Status: AC | PRN
Start: 2023-05-10 — End: ?

## 2023-05-10 NOTE — Telephone Encounter (Signed)
Referral Request - Has patient seen PCP for this complaint? No  *If NO, is insurance requiring patient see PCP for this issue before PCP can refer them? Patient is not sure, he has Medicaid / [employer]  Referral for which specialty: Dental Office  Preferred provider/office: No preference, just one that accepts his insurance.   Reason for referral: Patient states his tooth before wisdom tooth is split  Patient's callback # [phone]

## 2023-05-10 NOTE — Telephone Encounter (Signed)
Will forward to provider  

## 2023-05-13 ENCOUNTER — Telehealth: Payer: Self-pay | Admitting: Internal Medicine

## 2023-05-13 MED ORDER — OMNIPOD 5 DEXG7G6 PODS GEN 5 MISC: 1.0000 | 2 refills | Status: DC

## 2023-05-13 MED ORDER — OMNIPOD 5 DEXG7G6 INTRO GEN 5 KIT: PACK | 0 refills | Status: DC

## 2023-05-13 MED ORDER — DEXCOM G7 SENSOR MISC: 3 refills | Status: DC

## 2023-05-13 NOTE — Telephone Encounter (Signed)
Done

## 2023-05-13 NOTE — Telephone Encounter (Signed)
Patient is calling to say that his insurance will no longer cover the Dexcom G6 or the Omnipod.  Patient states that he needs a new prescription for everything relating to the Dexcom G7-sensors and everything.  Patient uses   [facility] [address] - [place], [place] - [address] (Ph: [phone])

## 2023-05-18 ENCOUNTER — Encounter (INDEPENDENT_AMBULATORY_CARE_PROVIDER_SITE_OTHER): Payer: Self-pay | Admitting: Primary Care

## 2023-05-19 ENCOUNTER — Encounter (INDEPENDENT_AMBULATORY_CARE_PROVIDER_SITE_OTHER): Payer: Self-pay | Admitting: Primary Care

## 2023-05-19 DIAGNOSIS — R5381 Other malaise: Secondary | ICD-10-CM

## 2023-05-27 ENCOUNTER — Telehealth: Payer: Self-pay | Admitting: Nutrition

## 2023-05-27 MED ORDER — GLOBAL EASE INJECT PEN NEEDLES 32G X 4 MM MISC: 1.0000 | Freq: Every day | 0 refills | Status: DC

## 2023-05-27 MED ORDER — OMNIPOD 5 G7 INTRO (GEN 5) KIT: 1.0000 | PACK | 0 refills | Status: DC

## 2023-05-27 MED ORDER — LANTUS SOLOSTAR 100 UNIT/ML ~~LOC~~ SOPN: 10.0000 [IU] | PEN_INJECTOR | Freq: Every day | SUBCUTANEOUS | 0 refills | Status: DC

## 2023-05-27 MED ORDER — OMNIPOD 5 G7 PODS (GEN 5) MISC: 1.0000 | 2 refills | Status: DC
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
## 2023-05-27 NOTE — Telephone Encounter (Signed)
Patient aware and verbalized understanding. °

## 2023-05-28 ENCOUNTER — Ambulatory Visit (INDEPENDENT_AMBULATORY_CARE_PROVIDER_SITE_OTHER): Payer: MEDICAID | Admitting: Primary Care

## 2023-05-28 ENCOUNTER — Encounter: Payer: Self-pay | Admitting: Internal Medicine

## 2023-05-28 ENCOUNTER — Telehealth: Payer: Self-pay | Admitting: Internal Medicine

## 2023-05-28 ENCOUNTER — Ambulatory Visit (INDEPENDENT_AMBULATORY_CARE_PROVIDER_SITE_OTHER): Payer: MEDICAID | Admitting: Internal Medicine

## 2023-05-28 ENCOUNTER — Encounter (INDEPENDENT_AMBULATORY_CARE_PROVIDER_SITE_OTHER): Payer: Self-pay | Admitting: Primary Care

## 2023-05-28 VITALS — BP 126/84 | HR 80 | Ht 71.0 in | Wt 174.0 lb

## 2023-05-28 VITALS — BP 127/76 | HR 72 | Resp 16 | Wt 174.2 lb

## 2023-05-28 DIAGNOSIS — H43399 Other vitreous opacities, unspecified eye: Secondary | ICD-10-CM | POA: Diagnosis not present

## 2023-05-28 DIAGNOSIS — F4323 Adjustment disorder with mixed anxiety and depressed mood: Secondary | ICD-10-CM

## 2023-05-28 DIAGNOSIS — E1040 Type 1 diabetes mellitus with diabetic neuropathy, unspecified: Secondary | ICD-10-CM | POA: Insufficient documentation

## 2023-05-28 DIAGNOSIS — E1065 Type 1 diabetes mellitus with hyperglycemia: Secondary | ICD-10-CM | POA: Insufficient documentation

## 2023-05-28 DIAGNOSIS — N1831 Chronic kidney disease, stage 3a: Secondary | ICD-10-CM | POA: Insufficient documentation

## 2023-05-28 DIAGNOSIS — E785 Hyperlipidemia, unspecified: Secondary | ICD-10-CM | POA: Insufficient documentation

## 2023-05-28 DIAGNOSIS — E1022 Type 1 diabetes mellitus with diabetic chronic kidney disease: Secondary | ICD-10-CM

## 2023-05-28 LAB — POCT GLYCOSYLATED HEMOGLOBIN (HGB A1C): Hemoglobin A1C: 8.2 % — AB (ref 4.0–5.6)

## 2023-05-28 LAB — LAB REPORT - SCANNED: EGFR: 30
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
## 2023-05-28 NOTE — Telephone Encounter (Signed)
Patient and pharmacy are aware and will contact insurance and file report

## 2023-05-28 NOTE — Patient Instructions (Addendum)
Lantus 10 units daily  Novolog 1 units for every 14 grams of carbohydrates  Novolog correctional insulin: ADD extra units on insulin to your meal-time Novolog dose if your blood sugars are higher than 180. Use the scale below to help guide you before each meal and bedtime   Blood sugar before meal Number of units to inject  Less than 180 0 unit  181 - 230 1 units  231 - 280 2 units  281 - 330 3 units  331 -  380 4 units  381 -  430 5 units     HOW TO TREAT LOW BLOOD SUGARS (Blood sugar LESS THAN 70 MG/DL) Please follow the RULE OF 15 for the treatment of hypoglycemia treatment (when your (blood sugars are less than 70 mg/dL)   STEP 1: Take 15 grams of carbohydrates when your blood sugar is low, which includes:  3-4 GLUCOSE TABS  OR 3-4 OZ OF JUICE OR REGULAR SODA OR ONE TUBE OF GLUCOSE GEL    STEP 2: RECHECK blood sugar in 15 MINUTES STEP 3: If your blood sugar is still low at the 15 minute recheck --> then, go back to STEP 1 and treat AGAIN with another 15 grams of carbohydrates.
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
## 2023-05-29 ENCOUNTER — Other Ambulatory Visit: Payer: Self-pay | Admitting: Nephrology

## 2023-05-29 DIAGNOSIS — E1022 Type 1 diabetes mellitus with diabetic chronic kidney disease: Secondary | ICD-10-CM

## 2023-05-29 DIAGNOSIS — N1832 Chronic kidney disease, stage 3b: Secondary | ICD-10-CM

## 2023-05-29 DIAGNOSIS — E1029 Type 1 diabetes mellitus with other diabetic kidney complication: Secondary | ICD-10-CM

## 2023-05-30 ENCOUNTER — Ambulatory Visit
Admission: RE | Admit: 2023-05-30 | Discharge: 2023-05-30 | Disposition: A | Discharge status: Home / Self Care | Payer: MEDICAID | Source: Ambulatory Visit | Attending: Nephrology | Admitting: Nephrology

## 2023-05-30 DIAGNOSIS — E1029 Type 1 diabetes mellitus with other diabetic kidney complication: Secondary | ICD-10-CM

## 2023-05-30 DIAGNOSIS — N1832 Chronic kidney disease, stage 3b: Secondary | ICD-10-CM

## 2023-05-30 DIAGNOSIS — E1022 Type 1 diabetes mellitus with diabetic chronic kidney disease: Secondary | ICD-10-CM

## 2023-06-02 ENCOUNTER — Encounter (INDEPENDENT_AMBULATORY_CARE_PROVIDER_SITE_OTHER): Payer: Self-pay | Admitting: Primary Care

## 2023-06-10 ENCOUNTER — Other Ambulatory Visit: Payer: Self-pay

## 2023-06-10 ENCOUNTER — Telehealth: Payer: Self-pay

## 2023-06-10 MED ORDER — DEXCOM G7 SENSOR MISC: 3 refills | Status: DC

## 2023-06-10 NOTE — Telephone Encounter (Signed)
Patient would like to know if  pump can be linked to my phone instead of the data center device I used with the 6 and omnipod 5 as he upgraded.
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
## 2023-06-11 NOTE — Telephone Encounter (Signed)
It was explained to patient that [employer] has the app for the phone, but only if patient is using the G6 sensor.  It may take several weeks for their new app to be available.  Patient chooses to remain with the G7 sensor.

## 2023-06-12 ENCOUNTER — Telehealth: Payer: Self-pay | Admitting: Nutrition

## 2023-06-12 ENCOUNTER — Ambulatory Visit: Payer: MEDICAID | Attending: Primary Care

## 2023-06-12 ENCOUNTER — Ambulatory Visit: Payer: MEDICAID | Admitting: Nutrition

## 2023-06-12 ENCOUNTER — Other Ambulatory Visit: Payer: Self-pay

## 2023-06-12 DIAGNOSIS — M6281 Muscle weakness (generalized): Secondary | ICD-10-CM | POA: Insufficient documentation

## 2023-06-12 DIAGNOSIS — R5381 Other malaise: Secondary | ICD-10-CM | POA: Insufficient documentation

## 2023-06-12 DIAGNOSIS — G629 Polyneuropathy, unspecified: Secondary | ICD-10-CM | POA: Insufficient documentation

## 2023-06-12 NOTE — Telephone Encounter (Signed)
Patient is requesting a copy of his pump settings. This was texted to him.

## 2023-06-12 NOTE — Telephone Encounter (Signed)
This was texted to him.11

## 2023-07-22 ENCOUNTER — Telehealth: Payer: Self-pay | Admitting: Neurology

## 2023-07-22 NOTE — Telephone Encounter (Signed)
 Pt cx appt. Will call back to r/s

## 2023-07-24 ENCOUNTER — Ambulatory Visit (INDEPENDENT_AMBULATORY_CARE_PROVIDER_SITE_OTHER): Payer: 59 | Admitting: Primary Care

## 2023-07-24 ENCOUNTER — Ambulatory Visit: Payer: MEDICAID | Admitting: Neurology

## 2023-09-02 ENCOUNTER — Telehealth: Payer: Self-pay | Admitting: Internal Medicine

## 2023-09-02 ENCOUNTER — Ambulatory Visit (INDEPENDENT_AMBULATORY_CARE_PROVIDER_SITE_OTHER): Payer: Self-pay | Admitting: Primary Care

## 2023-09-02 NOTE — Telephone Encounter (Signed)
 Patient called and said he has been trying to switch his medications to his new pharmacy [id number] - , [place] - [address]. He said he has been trying to get the pharmacy to transfer his meds but he can't get anyone to do it. He asked if we could send in new script to his pharmacy.  Insulin Disposable Pump (OMNIPOD 5 G7 PODS, GEN 5,) MISC  Continuous Glucose Sensor (DEXCOM G7 SENSOR) MISC  NOVOLOG bottles for the pump

## 2023-09-02 NOTE — Telephone Encounter (Signed)
 noted

## 2023-09-02 NOTE — Telephone Encounter (Signed)
 Copied from CRM [id number]. Topic: Clinical - Red Word Triage >> Sep 02, 2023 10:15 AM [name] wrote: [facility] that prompted transfer to Nurse Triage: Pt is not in a good mental state causing him to be unable to eat, and he's worried for his blood sugar   Chief Complaint: Anxiety Symptoms: Racing thoughts, difficulty sleeping and eating  Frequency: Constant  Pertinent Negatives: Patient denies suicidal or homicidal ideations  Disposition: [] ED /[] Urgent Care (no appt availability in office) / [x] Appointment(In office/virtual)/ []  [place] [facility]/ [] Home Care/ [] Refused Recommended Disposition /[] [place] [facility]/ []  Follow-up with PCP Additional Notes: Patient reports ongoing problem with racing thoughts that has been worsening. Patient states that his anxiety has been causing him difficulty eating and sleeping, which has also led to periods of low blood sugar that then increases his anxiety more. Patient denies any suicidal or homicidal ideations. Appointment made for tomorrow for the patient.     Reason for Disposition  MODERATE anxiety (e.g., persistent or frequent anxiety symptoms; interferes with sleep, school, or work)  Answer Assessment - Initial Assessment Questions 1. CONCERN: "Did anything happen that prompted you to call today?"      Difficulty sleeping and eating, concerned about blood sugar 2. ANXIETY SYMPTOMS: "Can you describe how you (your loved one; patient) have been feeling?" (e.g., tense, restless, panicky, anxious, keyed up, overwhelmed, sense of impending doom).      Racing thoughts  3. ONSET: "How long have you been feeling this way?" (e.g., hours, days, weeks)     Ongoing problem  4. SEVERITY: "How would you rate the level of anxiety?" (e.g., 0 - 10; or mild, moderate, severe).     Moderate to severe  5. FUNCTIONAL IMPAIRMENT: "How have these feelings affected your ability to do daily activities?" "Have you had more difficulty than usual doing your  normal daily activities?" (e.g., getting better, same, worse; self-care, school, work, interactions)     Yes, difficulty sleeping, eating less 6. HISTORY: "Have you felt this way before?" "Have you ever been diagnosed with an anxiety problem in the past?" (e.g., generalized anxiety disorder, panic attacks, PTSD). If Yes, ask: "How was this problem treated?" (e.g., medicines, counseling, etc.)     PTSD 7. RISK OF HARM - SUICIDAL IDEATION: "Do you ever have thoughts of hurting or killing yourself?" If Yes, ask:  "Do you have these feelings now?" "Do you have a plan on how you would do this?"     No 8. TREATMENT:  "What has been done so far to treat this anxiety?" (e.g., medicines, relaxation strategies). "What has helped?"     No 9. TREATMENT - THERAPIST: "Do you have a counselor or therapist? Name?"     No 10. POTENTIAL TRIGGERS: "Do you drink caffeinated beverages (e.g., coffee, colas, teas), and how much daily?" "Do you drink alcohol or use any drugs?" "Have you started any new medicines recently?"       Concerns about low blood sugar 11. PATIENT SUPPORT: "Who is with you now?" "Who do you live with?" "Do you have family or friends who you can talk to?"        Yes 12. OTHER SYMPTOMS: "Do you have any other symptoms?" (e.g., feeling depressed, trouble concentrating, trouble sleeping, trouble breathing, palpitations or fast heartbeat, chest pain, sweating, nausea, or diarrhea)       Trouble sleeping and eating, feeling depressed  Protocols used: Anxiety and Panic Attack-A-AH

## 2023-09-03 ENCOUNTER — Ambulatory Visit (INDEPENDENT_AMBULATORY_CARE_PROVIDER_SITE_OTHER): Payer: MEDICAID | Admitting: Primary Care

## 2023-09-03 ENCOUNTER — Encounter (INDEPENDENT_AMBULATORY_CARE_PROVIDER_SITE_OTHER): Payer: Self-pay | Admitting: Primary Care

## 2023-09-03 VITALS — BP 180/90 | HR 64 | Wt 168.6 lb

## 2023-09-03 DIAGNOSIS — E1059 Type 1 diabetes mellitus with other circulatory complications: Secondary | ICD-10-CM

## 2023-09-03 DIAGNOSIS — F4310 Post-traumatic stress disorder, unspecified: Secondary | ICD-10-CM | POA: Diagnosis not present

## 2023-09-03 DIAGNOSIS — I152 Hypertension secondary to endocrine disorders: Secondary | ICD-10-CM

## 2023-09-03 DIAGNOSIS — F4323 Adjustment disorder with mixed anxiety and depressed mood: Secondary | ICD-10-CM | POA: Diagnosis not present

## 2023-09-03 DIAGNOSIS — Z794 Long term (current) use of insulin: Secondary | ICD-10-CM

## 2023-09-03 DIAGNOSIS — G479 Sleep disorder, unspecified: Secondary | ICD-10-CM | POA: Diagnosis not present

## 2023-09-03 DIAGNOSIS — E1069 Type 1 diabetes mellitus with other specified complication: Secondary | ICD-10-CM | POA: Diagnosis not present

## 2023-09-03 DIAGNOSIS — E785 Hyperlipidemia, unspecified: Secondary | ICD-10-CM

## 2023-09-03 MED ORDER — OMNIPOD 5 G7 PODS (GEN 5) MISC: 1.0000 | 2 refills | Status: DC

## 2023-09-03 MED ORDER — INSULIN ASPART 100 UNIT/ML IJ SOLN: INTRAMUSCULAR | 1 refills | Status: DC

## 2023-09-03 MED ORDER — DEXCOM G7 SENSOR MISC: 3 refills | Status: DC

## 2023-09-03 NOTE — Progress Notes (Deleted)
 Patient is need consuling and not being able to sleep  40 min    Referral to psy   Skin: small bumps on arms, blister when heat  is on or outside.

## 2023-09-03 NOTE — Progress Notes (Unsigned)
 [facility]  [name], is a 48 y.o. male  NWG:[id number]  [id number]:[id number]  DOB - September 15, 1975  Chief Complaint  Patient presents with   Medical Management of Chronic Issues    Patient is need consuling and not being able to sleep  40 min    Referral   Skin: small bumps on arms, blister when heat  is on or outside.        Subjective:   [name] is a 48 y.o. male here today for a follow up visit. Patient has No headache, No chest pain, No abdominal pain - No Nausea, No new weakness tingling or numbness, No Cough - shortness of breath. Elevated Bp 191/83 did not take medication today. He remains homeless awaiting on disability. Depressed anxiety on edge - if thinks about something to long learned how to be numb.  No thoughts of suicidal or homicidal ideation at this time of the appointment.  However, he does have them  No problems updated.  Comprehensive ROS Pertinent positive and negative noted in HPI   Allergies  Allergen Reactions   Geodon [Ziprasidone Hcl] Other (See Comments)    Severe muscle spasms (ENTIRE BODY)- had to call EMS   Amlodipine Hives and Swelling    Lower extremities    Past Medical History:  Diagnosis Date   Diabetes mellitus    Hypertension    Seizures (HCC)     Current Outpatient Medications on File Prior to Visit  Medication Sig Dispense Refill   Accu-Chek Softclix Lancets lancets Use to check blood sugar up to 6 times daily. 100 each 6   Blood Glucose Monitoring Suppl (ACCU-CHEK GUIDE) w/Device KIT Use to check blood sugar up to 6 times daily. 1 kit 0   Blood Pressure Monitoring ([employer] ADVANCED BP MONITOR) DEVI 1 Device by Does not apply route daily. 1 each 0   cloNIDine (CATAPRES) 0.2 MG tablet Take 1 tablet (0.2 mg total) by mouth daily. 90 tablet 0   Continuous Glucose Sensor (DEXCOM G6 SENSOR) MISC 1 Device by Does not apply route as directed. 9 each 3   Continuous Glucose Sensor (DEXCOM G7 SENSOR) MISC Change sensors every  10 days 9 each 3   Continuous Glucose Transmitter (DEXCOM G6 TRANSMITTER) MISC 1 Device by Does not apply route as directed. 1 each 3   gabapentin (NEURONTIN) 300 MG capsule Take 1 capsule (300 mg total) by mouth daily. 30 capsule 0   GLOBAL INJECT EASE INSULIN SYR 30G X 1/2" 1 ML MISC USE WITH insulin DAILY in THE afternoon     glucose blood (ACCU-CHEK GUIDE) test strip USE TO test blood sugar six times EVERY DAY AS DIRECTED 100 strip 3   hydrOXYzine (ATARAX) 10 MG tablet Take 1 tablet (10 mg total) by mouth 3 (three) times daily as needed. 30 tablet 0   insulin aspart (NOVOLOG) 100 UNIT/ML injection Max daily 50 units 50 mL 1   Insulin Disposable Pump (OMNIPOD 5 G7 INTRO, GEN 5,) KIT 1 Device by Does not apply route every other day. 1 kit 0   Insulin Disposable Pump (OMNIPOD 5 G7 PODS, GEN 5,) MISC 1 Device by Does not apply route every other day. 45 each 2   insulin glargine (LANTUS SOLOSTAR) 100 UNIT/ML Solostar Pen Inject 10 Units into the skin at bedtime. 15 mL 0   Insulin Pen Needle (GLOBAL EASE INJECT PEN NEEDLES) 32G X 4 MM MISC 1 Device by Other route daily in the afternoon. 50 each 0  Insulin Syringe-Needle U-100 (INSULIN SYRINGE .5CC/28G) 28G X 1/2" 0.5 ML MISC 1 Device by Does not apply route daily in the afternoon. 50 each 3   Insulin Syringes, Disposable, U-100 0.3 ML MISC Use to inject 8 units three times a day 100 each 11   lisinopril-hydrochlorothiazide (ZESTORETIC) 20-25 MG tablet Take 1 tablet by mouth daily. 90 tablet 1   rosuvastatin (CRESTOR) 10 MG tablet Take 1 tablet (10 mg total) by mouth daily. 90 tablet 3   Continuous Blood Gluc Receiver (FREESTYLE LIBRE 2 READER) DEVI Use to check blood sugar three times daily. E10.8 (Patient not taking: Reported on 09/03/2023) 1 each 0   Continuous Glucose Receiver (DEXCOM G6 RECEIVER) DEVI Use to monitor blood sugars (Patient not taking: Reported on 09/03/2023) 1 each 0   hydrALAZINE (APRESOLINE) 10 MG tablet Take 1 tablet (10 mg total)  by mouth every 8 (eight) hours. 180 tablet 0   No current facility-administered medications on file prior to visit.   Health Maintenance  Topic Date Due   Pneumococcal Vaccination (2 of 2 - PCV) 07/09/2008   Eye exam for diabetics  05/10/2022   Colon Cancer Screening  Never done   COVID-19 Vaccine (1 - 2024-25 season) Never done   Flu Shot  10/07/2023*   Hemoglobin A1C  11/25/2023   Complete foot exam   05/27/2024   DTaP/Tdap/Td vaccine (3 - Td or Tdap) 11/14/2032   Hepatitis C Screening  Completed   HIV Screening  Completed   HPV Vaccine  Aged Out  *Topic was postponed. The date shown is not the original due date.    Objective:   Vitals:   09/03/23 1515 09/03/23 1542  BP: (!) 191/83 (!) 180/90  Pulse: 64   SpO2: 100%   Weight: 168 lb 9.6 oz (76.5 kg)    BP Readings from Last 3 Encounters:  09/03/23 (!) 180/90  05/28/23 127/76  05/28/23 126/84    Assessment & Plan   [name] was seen today for medical management of chronic issues.  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 DIET: Limit salt intake, read nutrition labels to check salt content, limit fried and high fatty foods (food choices limited) Avoid using multisymptom OTC cold preparations that generally contain sudafed which can rise BP.  Consult with pharmacist on best cold relief products to use for persons with HTN EXERCISE Discussed incorporating exercise such as walking - 30 minutes most days of the week and can do in 10 minute intervals     Patient have been counseled extensively about nutrition and exercise. Other issues discussed during this visit include: low cholesterol diet, weight control and daily exercise, foot care, annual eye examinations at Ophthalmology, importance of adherence with medications and regular follow-up. We also discussed long term complications of uncontrolled diabetes and  hypertension.   Return in about 3 weeks (around 09/24/2023).  The patient was given clear instructions to go to ER or return to medical center if symptoms don't improve, worsen or new problems develop. The patient verbalized understanding. The patient was told to call to get lab results if they haven't heard anything in the next week.   This note has been created with Education officer, environmental. Any transcriptional errors are unintentional.   [name], NP 09/03/2023, 3:51 PM

## 2023-09-10 ENCOUNTER — Ambulatory Visit (INDEPENDENT_AMBULATORY_CARE_PROVIDER_SITE_OTHER): Payer: Self-pay | Admitting: Primary Care

## 2023-09-10 ENCOUNTER — Telehealth (INDEPENDENT_AMBULATORY_CARE_PROVIDER_SITE_OTHER): Payer: MEDICAID | Admitting: Primary Care

## 2023-09-10 DIAGNOSIS — E108 Type 1 diabetes mellitus with unspecified complications: Secondary | ICD-10-CM

## 2023-09-10 NOTE — Progress Notes (Unsigned)
 [facility]  Virtual Visit Note  I connected with [name], on 09/10/2023 at 8:41 AM through an audio and video application and verified that I am speaking with the correct person using two identifiers.   Consent: I discussed the limitations, risks, security and privacy concerns of performing an evaluation and management service by mychart and the availability of in person appointments. I also discussed with the patient that there may be a patient responsible charge related to this service. The patient expressed understanding and agreed to proceed.   Location of Patient: home  Location of Provider: [facility]   Persons participating in visit: Shizuo N Ilona Sorrel,  NP   History of Present Illness: [name] is a 85 male who is having acute televisit.  Blood sugar this morning unable to read explained to patient is greater than 500.  He has taken 7 units of NovoLog and taking his Lantus.  Advised patient to take 14 units of NovoLog and drink 32 to 64 ounces of water call clinic back with readings..  Patient verbalizes understanding .   Past Medical History:  Diagnosis Date   Diabetes mellitus    Hypertension    Seizures (HCC)    Allergies  Allergen Reactions   Geodon [Ziprasidone Hcl] Other (See Comments)    Severe muscle spasms (ENTIRE BODY)- had to call EMS   Amlodipine Hives and Swelling    Lower extremities    Current Outpatient Medications on File Prior to Visit  Medication Sig Dispense Refill   Accu-Chek Softclix Lancets lancets Use to check blood sugar up to 6 times daily. 100 each 6   Blood Glucose Monitoring Suppl (ACCU-CHEK GUIDE) w/Device KIT Use to check blood sugar up to 6 times daily. 1 kit 0   Blood Pressure Monitoring ([employer] ADVANCED BP MONITOR) DEVI 1 Device by Does not apply route daily. 1 each 0   cloNIDine (CATAPRES) 0.2 MG tablet Take 1 tablet (0.2 mg total) by mouth  daily. 90 tablet 0   Continuous Blood Gluc Receiver (FREESTYLE LIBRE 2 READER) DEVI Use to check blood sugar three times daily. E10.8 (Patient not taking: Reported on 09/03/2023) 1 each 0   Continuous Glucose Receiver (DEXCOM G6 RECEIVER) DEVI Use to monitor blood sugars (Patient not taking: Reported on 09/03/2023) 1 each 0   Continuous Glucose Sensor (DEXCOM G6 SENSOR) MISC 1 Device by Does not apply route as directed. 9 each 3   Continuous Glucose Sensor (DEXCOM G7 SENSOR) MISC Change sensors every 10 days 9 each 3   Continuous Glucose Transmitter (DEXCOM G6 TRANSMITTER) MISC 1 Device by Does not apply route as directed. 1 each 3   gabapentin (NEURONTIN) 300 MG capsule Take 1 capsule (300 mg total) by mouth daily. 30 capsule 0   GLOBAL INJECT EASE INSULIN SYR 30G X 1/2" 1 ML MISC USE WITH insulin DAILY in THE afternoon     glucose blood (ACCU-CHEK GUIDE) test strip USE TO test blood sugar six times EVERY DAY AS DIRECTED 100 strip 3   hydrALAZINE (APRESOLINE) 10 MG tablet Take 1 tablet (10 mg total) by mouth every 8 (eight) hours. 180 tablet 0   hydrOXYzine (ATARAX) 10 MG tablet Take 1 tablet (10 mg total) by mouth 3 (three) times daily as needed. 30 tablet 0   insulin aspart (NOVOLOG) 100 UNIT/ML injection Max daily 50 units 50 mL 1   Insulin Disposable Pump (OMNIPOD 5 G7 INTRO, GEN  5,) KIT 1 Device by Does not apply route every other day. 1 kit 0   Insulin Disposable Pump (OMNIPOD 5 G7 PODS, GEN 5,) MISC 1 Device by Does not apply route every other day. 45 each 2   insulin glargine (LANTUS SOLOSTAR) 100 UNIT/ML Solostar Pen Inject 10 Units into the skin at bedtime. 15 mL 0   Insulin Pen Needle (GLOBAL EASE INJECT PEN NEEDLES) 32G X 4 MM MISC 1 Device by Other route daily in the afternoon. 50 each 0   Insulin Syringe-Needle U-100 (INSULIN SYRINGE .5CC/28G) 28G X 1/2" 0.5 ML MISC 1 Device by Does not apply route daily in the afternoon. 50 each 3   Insulin Syringes, Disposable, U-100 0.3 ML MISC Use  to inject 8 units three times a day 100 each 11   lisinopril-hydrochlorothiazide (ZESTORETIC) 20-25 MG tablet Take 1 tablet by mouth daily. 90 tablet 1   rosuvastatin (CRESTOR) 10 MG tablet Take 1 tablet (10 mg total) by mouth daily. 90 tablet 3   No current facility-administered medications on file prior to visit.    Observations/Objective: See HPI  Assessment and Plan: Diagnoses and all orders for this visit:  DM (diabetes mellitus), type 1 with complications (HCC) See HPI    Follow Up Instructions:    I discussed the assessment and treatment plan with the patient. The patient was provided an opportunity to ask questions and all were answered. The patient agreed with the plan and demonstrated an understanding of the instructions.   The patient was advised to call back or seek an in-person evaluation if the symptoms worsen or if the condition fails to improve as anticipated.     I provided 15 minutes total time during this encounter including median intraservice time, reviewing previous notes, investigations, ordering medications, medical decision making, coordinating care and patient verbalized understanding at the end of the visit.    This note has been created with Education officer, environmental. Any transcriptional errors are unintentional.   [name], NP 09/10/2023, 8:41 AM

## 2023-09-10 NOTE — Telephone Encounter (Signed)
 Copied from CRM [id number]. Topic: Clinical - Red Word Triage >> Sep 10, 2023  8:12 AM Whitney O wrote: [facility] that prompted transfer to Nurse Triage: blood sugar high not feeling well and has a appointment this morning   Chief Complaint: elevated blood glucose Symptoms: nausea Frequency: ongoing since yesterday Pertinent Negatives: Patient denies vomiting or dizziness Disposition: [] ED /[] Urgent Care (no appt availability in office) / [x] Appointment(In office/virtual)/ []  [place] [facility]/ [] Home Care/ [] Refused Recommended Disposition /[] [place] [facility]/ [x]  Follow-up with PCP Additional Notes: The patient reported that he "feels out of sorts" as he is usually up 5:30-6 am but his blood sugar has been elevated since yesterday and he slept harder than usual and did not hear his alarm.  He ran out of Omni pods yesterday and his normal pharmacy does not have them in stock.   The pharmacy referred him to a different pharmacy that he was closed by the time he could go.  His blood sugar machine is reading "high" and he said this probably means it's at least 400.  He took 17 units of lantus and 7 units of Novolog at 0750 today.  He reported nausea but no vomiting.  He has been having intermittent sternal pain since his last office visit.  He does not have a blood pressure monitor and has only been taking lisinopril-hydrochlorothiazide daily and Ginger root, garlic cloves and lemon in boiling water twice daily.  He was calling to state he would not make it to his scheduled appointment today.  Advised him that he should keep the appointment to address blood glucose.  Called [facility] and spoke to [facility] regarding switching the appointment to virtual, she stated that was fine.  The appointment was changed and instructed him to log into mychart prior to appointment time.   Reason for Disposition  Blood glucose > 400 mg/dL (91.4 mmol/L)  Answer Assessment - Initial Assessment Questions 1. BLOOD GLUCOSE:  "What is your blood glucose level?"      Machine says "high" 2. ONSET: "When did you check the blood glucose?"     yesterday 3. USUAL RANGE: "What is your glucose level usually?" (e.g., usual fasting morning value, usual evening value)     140 6. INSULIN: "Do you take insulin?" "What type of insulin(s) do you use? What is the mode of delivery? (syringe, pen; injection or pump)?"      Lantus 17 units at 0750    8. OTHER SYMPTOMS: "Do you have any symptoms?" (e.g., fever, frequent urination, difficulty breathing, dizziness, weakness, vomiting)     Nauseous  Protocols used: Diabetes - High Blood Sugar-A-AH

## 2023-09-12 ENCOUNTER — Telehealth (INDEPENDENT_AMBULATORY_CARE_PROVIDER_SITE_OTHER): Payer: Self-pay | Admitting: Licensed Clinical Social Worker

## 2023-09-12 ENCOUNTER — Other Ambulatory Visit: Payer: Self-pay

## 2023-09-12 ENCOUNTER — Emergency Department (HOSPITAL_COMMUNITY)
Admission: EM | Admit: 2023-09-12 | Discharge: 2023-09-12 | Disposition: A | Discharge status: Home / Self Care | Payer: MEDICAID | Attending: Emergency Medicine | Admitting: Emergency Medicine

## 2023-09-12 ENCOUNTER — Ambulatory Visit (INDEPENDENT_AMBULATORY_CARE_PROVIDER_SITE_OTHER): Payer: Self-pay | Admitting: Primary Care

## 2023-09-12 DIAGNOSIS — Z23 Encounter for immunization: Secondary | ICD-10-CM | POA: Insufficient documentation

## 2023-09-12 DIAGNOSIS — S61452A Open bite of left hand, initial encounter: Secondary | ICD-10-CM | POA: Diagnosis not present

## 2023-09-12 DIAGNOSIS — W540XXA Bitten by dog, initial encounter: Secondary | ICD-10-CM | POA: Diagnosis not present

## 2023-09-12 DIAGNOSIS — Z203 Contact with and (suspected) exposure to rabies: Secondary | ICD-10-CM | POA: Diagnosis not present

## 2023-09-12 DIAGNOSIS — E109 Type 1 diabetes mellitus without complications: Secondary | ICD-10-CM | POA: Insufficient documentation

## 2023-09-12 DIAGNOSIS — Z794 Long term (current) use of insulin: Secondary | ICD-10-CM | POA: Diagnosis not present

## 2023-09-12 DIAGNOSIS — S60922A Unspecified superficial injury of left hand, initial encounter: Secondary | ICD-10-CM | POA: Diagnosis present

## 2023-09-12 DIAGNOSIS — Z2914 Encounter for prophylactic rabies immune globin: Secondary | ICD-10-CM | POA: Insufficient documentation

## 2023-09-12 MED ORDER — AMOXICILLIN-POT CLAVULANATE 875-125 MG PO TABS: 1.0000 | ORAL_TABLET | Freq: Two times a day (BID) | ORAL | 0 refills | Status: DC

## 2023-09-12 MED ORDER — AMOXICILLIN-POT CLAVULANATE 875-125 MG PO TABS
1.0000 | ORAL_TABLET | Freq: Once | ORAL | Status: AC
  Administered 2023-09-12: 1 via ORAL
  Filled 2023-09-12: qty 1

## 2023-09-12 MED ORDER — RABIES VACCINE, PCEC IM SUSR
1.0000 mL | Freq: Once | INTRAMUSCULAR | Status: AC
  Administered 2023-09-12: 1 mL via INTRAMUSCULAR
  Filled 2023-09-12: qty 1

## 2023-09-12 MED ORDER — TETANUS-DIPHTH-ACELL PERTUSSIS 5-2.5-18.5 LF-MCG/0.5 IM SUSY
0.5000 mL | PREFILLED_SYRINGE | Freq: Once | INTRAMUSCULAR | Status: AC
  Administered 2023-09-12: 0.5 mL via INTRAMUSCULAR
  Filled 2023-09-12: qty 0.5

## 2023-09-12 MED ORDER — RABIES IMMUNE GLOBULIN 150 UNIT/ML IM INJ
20.0000 [IU]/kg | INJECTION | Freq: Once | INTRAMUSCULAR | Status: AC
  Administered 2023-09-12: 1500 [IU]
  Filled 2023-09-12: qty 10

## 2023-09-12 NOTE — ED Notes (Signed)
 Puncture wound noted on left thumb and left hand between ring finger and pinky finger.

## 2023-09-12 NOTE — Discharge Instructions (Addendum)
 Please be sure to follow the prescribed regimen of rabies shots.    The hand wounds currently are superficial, but if you develop new, or concerning changes, do not hesitate to return here for additional evaluation.

## 2023-09-12 NOTE — Telephone Encounter (Signed)
 Copied from CRM [id number]. Topic: Clinical - Red Word Triage >> Sep 12, 2023 10:28 AM Patsy Lager T wrote: Red Word that prompted transfer to Nurse Triage: patient stated he was bitten by a small dog on his right hand (thumb) and bitten by a big dog on the inside of his left hand. Left hand wound is open, swollen and hurting.   Chief Complaint: Dog bites  Symptoms: Swelling and pain of bite areas Frequency: Two bites from separate dogs  Disposition: [x] ED /[x] Urgent Care (no appt availability in office) / [] Appointment(In office/virtual)/ []  [place] [facility]/ [] Home Care/ [] Refused Recommended Disposition /[] [place] [facility]/ []  Follow-up with PCP Additional Notes: Patient reports that he was bit yesterday by two dogs. He states one was a small dog he was picking up and she got startled, and the other was a big dog that can be food aggressive. He is unsure if they have been vaccinated for rabies. Patient advised to go to the ED or urgent care for evaluation and treatment of his bites. Patient understood and is agreeable with this plan.     Reason for Disposition  [1] Puncture wound or small cut AND [2] on hands or genitals  (Exception: Puncture  from small pet such as gerbil, mouse, hamster, puppy or turtle.)  Answer Assessment - Initial Assessment Questions 1. ANIMAL: "What type of animal caused the bite?" "Is the injury from a bite or a claw?" If the animal is a dog or a cat, ask: "Was it a pet or a stray?" "Was it acting ill or behaving strangely?"     Dog 2. LOCATION: "Where is the bite located?"      Right hand thumb and left hand  3. SIZE: "How big is the bite?" "What does it look like?"      Left hand bite swollen and painful  4. ONSET: "When did the bite happen?" (Minutes or hours ago)      Yesterday  5. CIRCUMSTANCES: "Tell me how this happened."      Right thumb putting dog away and dog startled, other dog was food aggressive  6. TETANUS: "When was your last tetanus  booster?"     1 year ago  7. RABIES VACCINE: For dog or cat bites, ask: "Do you know if the pet is vaccinated against rabies?"  (e.g., yes, no, overdue for rabies shot, unknown)     Unsure  Protocols used: Animal Bite-A-AH

## 2023-09-12 NOTE — ED Provider Notes (Signed)
 [place] EMERGENCY DEPARTMENT AT [facility] Provider Note   CSN: [id number] Arrival date & time: 09/12/23  1317     History  Chief Complaint  Patient presents with   Animal Bite    [name] is a 48 y.o. male.  HPI Patient presents after sustaining a dog bite yesterday.  For sustained 20 hours ago, he was bit by a small poodle like a dog on the right thumb lateral area distal PIP.  No loss of function, sensation in the area, there is erythema however. About 4 hours later the patient was bitten by another dog while giving food.  This was a husky, with a deeper bite on the left palm between the third and fourth digits.  Again no loss of function, substantial pain.  Patient notes that he is otherwise in his usual state of health, insulin-dependent diabetes, well-controlled.  No known status of the dog's vaccinations.    Home Medications Prior to Admission medications   Medication Sig Start Date End Date Taking? Authorizing Provider  Accu-Chek Softclix Lancets lancets Use to check blood sugar up to 6 times daily. 11/06/22   [name], NP  Blood Glucose Monitoring Suppl (ACCU-CHEK GUIDE) w/Device KIT Use to check blood sugar up to 6 times daily. 11/06/22   [name], NP  Blood Pressure Monitoring ([employer]S ADVANCED BP MONITOR) DEVI 1 Device by Does not apply route daily. 03/20/22   Waldon Merl, PA-C  cloNIDine (CATAPRES) 0.2 MG tablet Take 1 tablet (0.2 mg total) by mouth daily. 12/09/22   [name], NP  Continuous Blood Gluc Receiver (FREESTYLE LIBRE 2 READER) DEVI Use to check blood sugar three times daily. E10.8 Patient not taking: Reported on 09/03/2023 03/07/22   Claiborne Rigg, NP  Continuous Glucose Receiver (DEXCOM G6 RECEIVER) DEVI Use to monitor blood sugars Patient not taking: Reported on 09/03/2023 12/25/22   Shamleffer, Konrad Dolores, MD  Continuous Glucose Sensor (DEXCOM G6 SENSOR) MISC 1 Device by Does not apply route as directed.  12/11/22   Shamleffer, Konrad Dolores, MD  Continuous Glucose Sensor (DEXCOM G7 SENSOR) MISC Change sensors every 10 days 09/03/23   Shamleffer, Konrad Dolores, MD  Continuous Glucose Transmitter (DEXCOM G6 TRANSMITTER) MISC 1 Device by Does not apply route as directed. 12/11/22   Shamleffer, Konrad Dolores, MD  gabapentin (NEURONTIN) 300 MG capsule Take 1 capsule (300 mg total) by mouth daily. 08/25/22   Lyndle Herrlich, MD  GLOBAL INJECT EASE INSULIN SYR 30G X 1/2" 1 ML MISC USE WITH insulin DAILY in THE afternoon 01/18/23   [provider]  glucose blood (ACCU-CHEK GUIDE) test strip USE TO test blood sugar six times EVERY DAY AS DIRECTED 02/07/23   Hoy Register, MD  hydrALAZINE (APRESOLINE) 10 MG tablet Take 1 tablet (10 mg total) by mouth every 8 (eight) hours. 11/19/22 01/18/23  Darlin Drop, DO  hydrOXYzine (ATARAX) 10 MG tablet Take 1 tablet (10 mg total) by mouth 3 (three) times daily as needed. 05/10/23   [name], NP  insulin aspart (NOVOLOG) 100 UNIT/ML injection Max daily 50 units 09/03/23   Shamleffer, Konrad Dolores, MD  Insulin Disposable Pump (OMNIPOD 5 G7 INTRO, GEN 5,) KIT 1 Device by Does not apply route every other day. 05/27/23   Shamleffer, Konrad Dolores, MD  Insulin Disposable Pump (OMNIPOD 5 G7 PODS, GEN 5,) MISC 1 Device by Does not apply route every other day. 09/03/23   Shamleffer, Konrad Dolores, MD  insulin glargine (  LANTUS SOLOSTAR) 100 UNIT/ML Solostar Pen Inject 10 Units into the skin at bedtime. 05/27/23   Shamleffer, Konrad Dolores, MD  Insulin Pen Needle (GLOBAL EASE INJECT PEN NEEDLES) 32G X 4 MM MISC 1 Device by Other route daily in the afternoon. 05/27/23   Shamleffer, Konrad Dolores, MD  Insulin Syringe-Needle U-100 (INSULIN SYRINGE .5CC/28G) 28G X 1/2" 0.5 ML MISC 1 Device by Does not apply route daily in the afternoon. 01/18/23   Shamleffer, Konrad Dolores, MD  Insulin Syringes, Disposable, U-100 0.3 ML MISC Use to inject 8 units three  times a day 11/15/22   [name], NP  lisinopril-hydrochlorothiazide (ZESTORETIC) 20-25 MG tablet Take 1 tablet by mouth daily. 04/01/23   [name], NP  rosuvastatin (CRESTOR) 10 MG tablet Take 1 tablet (10 mg total) by mouth daily. 12/11/22   Shamleffer, Konrad Dolores, MD      Allergies    Geodon [ziprasidone hcl] and Amlodipine    Review of Systems   Review of Systems  Physical Exam Updated Vital Signs BP (!) 157/86 (BP Location: Right Arm)   Pulse 64   Temp 98.3 F (36.8 C)   Resp 16   Ht 5\' 11"  (1.803 m)   Wt 73.5 kg   SpO2 100%   BMI 22.59 kg/m  Physical Exam Vitals and nursing note reviewed.  Constitutional:      General: He is not in acute distress.    Appearance: He is well-developed.  HENT:     Head: Normocephalic and atraumatic.  Eyes:     Conjunctiva/sclera: Conjunctivae normal.  Cardiovascular:     Rate and Rhythm: Normal rate and regular rhythm.     Pulses: Normal pulses.  Pulmonary:     Effort: Pulmonary effort is normal. No respiratory distress.     Breath sounds: No stridor.  Abdominal:     General: There is no distension.  Musculoskeletal:       Arms:  Skin:    General: Skin is warm and dry.  Neurological:     Mental Status: He is alert and oriented to person, place, and time.     ED Results / Procedures / Treatments   Labs (all labs ordered are listed, but only abnormal results are displayed) Labs Reviewed - No data to display  EKG None  Radiology No results found.  Procedures Procedures    Medications Ordered in ED Medications  rabies immune globulin (HYPERRAB/KEDRAB) injection 1,500 Units (has no administration in time range)  rabies vaccine (RABAVERT) injection 1 mL (has no administration in time range)  amoxicillin-clavulanate (AUGMENTIN) 875-125 MG per tablet 1 tablet (1 tablet Oral Given 09/12/23 1639)  Tdap (BOOSTRIX) injection 0.5 mL (0.5 mLs Intramuscular Given 09/12/23 1642)    ED Course/ Medical  Decision Making/ A&P                                 Medical Decision Making Adult male with insulin-dependent diabetes presents with multiple dog bites.  Patient is awake, alert, afebrile, has no confluent erythema, no loss of function suggesting substantial cellulitis, deep space infection, bacteremia or sepsis.  Patient received rabies, tetanus, antibiotics, wound care, will require ongoing rabies injections, which he is aware of.  Risk Prescription drug management.    Final Clinical Impression(s) / ED Diagnoses Final diagnoses:  Dog bite, initial encounter     Gerhard Munch, MD 09/12/23 1725

## 2023-09-12 NOTE — Telephone Encounter (Signed)
 noted

## 2023-09-12 NOTE — ED Notes (Signed)
 Patient puncture wounds saturated with Betadine per MD Jeraldine Loots order.  Cleaned with sterile water.

## 2023-09-12 NOTE — ED Notes (Signed)
 Awaiting patient from lobby.

## 2023-09-12 NOTE — Telephone Encounter (Signed)
 LCSWA Intern called patient today to introduce herself and to assess patients' mental health needs. Patient did not answer the phone. LCSWA Intern was able to leave a brief message with the patient asking them to return the call. Patient was referred by PCP for PTSD , anxiety and depression.

## 2023-09-12 NOTE — ED Triage Notes (Signed)
 Pt was bitten multiple times by two different dogs last night.Pt has two puncture wounds to right thumb and a wound to left hand. UNK vax status of dogs. Bleeding controlled.

## 2023-09-13 ENCOUNTER — Ambulatory Visit (INDEPENDENT_AMBULATORY_CARE_PROVIDER_SITE_OTHER): Payer: MEDICAID | Admitting: Primary Care

## 2023-09-16 ENCOUNTER — Telehealth (INDEPENDENT_AMBULATORY_CARE_PROVIDER_SITE_OTHER): Payer: Self-pay | Admitting: Primary Care

## 2023-09-16 NOTE — Telephone Encounter (Signed)
 Please contact pt and schedule a hospital f/u

## 2023-09-16 NOTE — Telephone Encounter (Signed)
 Called pt to schedule HFU appt. Please advise.

## 2023-09-16 NOTE — Telephone Encounter (Signed)
 Pt came through doors and said he was bit by a dog. States that he went to Emergency room and they needed him to come back or either go to PCP. Pt wants to know if he can come here for follow up.

## 2023-09-25 ENCOUNTER — Encounter: Payer: Self-pay | Admitting: Internal Medicine

## 2023-09-25 ENCOUNTER — Telehealth: Payer: MEDICAID | Admitting: Internal Medicine

## 2023-09-25 ENCOUNTER — Telehealth: Payer: Self-pay | Admitting: Internal Medicine

## 2023-09-25 VITALS — Ht 71.0 in

## 2023-09-25 DIAGNOSIS — E1040 Type 1 diabetes mellitus with diabetic neuropathy, unspecified: Secondary | ICD-10-CM | POA: Diagnosis not present

## 2023-09-25 DIAGNOSIS — E1022 Type 1 diabetes mellitus with diabetic chronic kidney disease: Secondary | ICD-10-CM

## 2023-09-25 DIAGNOSIS — E1065 Type 1 diabetes mellitus with hyperglycemia: Secondary | ICD-10-CM

## 2023-09-25 DIAGNOSIS — E785 Hyperlipidemia, unspecified: Secondary | ICD-10-CM

## 2023-09-25 DIAGNOSIS — N1831 Chronic kidney disease, stage 3a: Secondary | ICD-10-CM

## 2023-09-25 MED ORDER — INSULIN ASPART 100 UNIT/ML IJ SOLN: INTRAMUSCULAR | 3 refills | Status: DC

## 2023-09-25 MED ORDER — ROSUVASTATIN CALCIUM 10 MG PO TABS: 10.0000 mg | ORAL_TABLET | Freq: Every day | ORAL | 3 refills | Status: DC

## 2023-09-25 MED ORDER — DEXCOM G7 SENSOR MISC: 3 refills | Status: DC

## 2023-09-25 MED ORDER — OMNIPOD 5 G7 PODS (GEN 5) MISC: 1.0000 | 2 refills | Status: AC

## 2023-09-25 NOTE — Progress Notes (Signed)
 Name: [name]  MRN/ DOB: [id number], 14-Jul-1975   Age/ Sex: 48 y.o., male    PCP: [name], NP   Reason for Endocrinology Evaluation: Type 1 Diabetes Mellitus     Date of Initial Endocrinology Visit: 12/11/2022    PATIENT IDENTIFIER: [name] is a 48 y.o. male with a past medical history of HTN, DM. The patient presented for initial endocrinology clinic visit on 12/11/2022 for consultative assistance with his diabetes management.    HPI: [name] was    Diagnosed with DM at age 27 Prior Medications tried/Intolerance: insulin .  He was on the pump as a child Currently checking blood sugars multiple  x / day Hypoglycemia episodes : yes               Symptoms: yes                 Frequency: 2/day Hemoglobin A1c has ranged from 7.8% in 2010, peaking at 12.7 % in 2021.   Ed visit 11/17/2022  In terms of diet, the patient eats 4 meals, snacks occasionally.   Had nausea in the morning but this has resolved  Denies constipation or diarrhea    On his initial visit to our clinic his A1c was 9.4%, he was on MDI regimen which was adjusted  He was subsequently trained on OmniPod use 01/2023  SUBJECTIVE:   During the last visit (05/28/2023): A1c 8.2%  Today (09/25/23): [name] is here for follow-up on diabetes management.  He checks his glucose multiple times daily through CGM.  The patient has been noted with hypoglycemia, he is symptomatic with these episodes   He denies any recent nausea or vomiting He denies any recent constipation or diarrhea  He has been going to what sounds like social issues which resulted in food insecurities    This patient with type 1 diabetes is treated with Omnipod (insulin pump). During the visit the pump basal and bolus doses were reviewed including carb/insulin rations and supplemental doses. The clinical list was updated. The glucose meter download was reviewed in detail to determine if the current pump settings are  providing the best glycemic control without excessive hypoglycemia.  Pump and meter download:    Pump   Omnipod Settings   Insulin type   Novolog   Basal rate       0000 0.4 u/h               I:C ratio       0000 1:14                  Sensitivity       0000  50      Goal       0000  120            Type & Model of Pump: omnipod Insulin Type: Currently using Novolog.  Body mass index is 22.59 kg/m.  PUMP STATISTICS: Average BG: 214  Average Daily Carbs (g): 78.6  Average Total Daily Insulin: 35.9  Average Daily Basal: 21.5 (60 %) Average Daily Bolus: 14.3 (40 %)     HOME DIABETES REGIMEN: Novolog  Rosuvastatin 10 mg daily  Statin: No ACE-I/ARB: No    CONTINUOUS GLUCOSE MONITORING RECORD INTERPRETATION    Dates of Recording:3/6-3/19/2025  Sensor description: dexcom   Results statistics:   CGM use % of time 78.6  Average and SD 214/85  Time in range 38 %  %  Time Above 180 27  % Time above 250 33  % Time Below target 2   Glycemic patterns summary: BGs are optimal overnight and fluctuate during the day  Hyperglycemic episodes postprandial  Hypoglycemic episodes occurred post bolus  Overnight periods: Variable but mostly within normal range    DIABETIC COMPLICATIONS: Microvascular complications:  CKD, neuropathy Denies:  Last eye exam: Completed 05/10/2021  Macrovascular complications:   Denies: CAD, PVD, CVA   PAST HISTORY: Past Medical History:  Past Medical History:  Diagnosis Date   Diabetes mellitus    Hypertension    Seizures (HCC)    Past Surgical History:  Past Surgical History:  Procedure Laterality Date   HERNIA REPAIR      Social History:  reports that he has never smoked. He has never used smokeless tobacco. He reports that he does not currently use alcohol after a past usage of about 1.0 standard drink of alcohol per week. He reports that he does not use drugs. Family History:  Family History  Problem  Relation Age of Onset   Hypertension Mother    Diabetes Paternal Uncle      HOME MEDICATIONS: Allergies as of 09/25/2023       Reactions   Geodon [ziprasidone Hcl] Other (See Comments)   Severe muscle spasms (ENTIRE BODY)- had to call EMS   Amlodipine Hives, Swelling   Lower extremities        Medication List        Accurate as of September 25, 2023  7:10 AM. If you have any questions, ask your nurse or doctor.          Accu-Chek Guide test strip Generic drug: glucose blood USE TO test blood sugar six times EVERY DAY AS DIRECTED   Accu-Chek Guide w/Device Kit Use to check blood sugar up to 6 times daily.   Accu-Chek Softclix Lancets lancets Use to check blood sugar up to 6 times daily.   amoxicillin-clavulanate 875-125 MG tablet Commonly known as: AUGMENTIN Take 1 tablet by mouth every 12 (twelve) hours.   cloNIDine 0.2 MG tablet Commonly known as: Catapres Take 1 tablet (0.2 mg total) by mouth daily.   [employer] Advanced BP Monitor Devi 1 Device by Does not apply route daily.   Dexcom G6 Sensor Misc 1 Device by Does not apply route as directed.   Dexcom G7 Sensor Misc Change sensors every 10 days   Dexcom G6 Transmitter Misc 1 Device by Does not apply route as directed.   FreeStyle Libre 2 Reader [place] Use to check blood sugar three times daily. E10.8   Dexcom G6 Receiver Devi Use to monitor blood sugars   gabapentin 300 MG capsule Commonly known as: NEURONTIN Take 1 capsule (300 mg total) by mouth daily.   Global Ease Inject Pen Needles 32G X 4 MM Misc Generic drug: Insulin Pen Needle 1 Device by Other route daily in the afternoon.   hydrALAZINE 10 MG tablet Commonly known as: APRESOLINE Take 1 tablet (10 mg total) by mouth every 8 (eight) hours.   hydrOXYzine 10 MG tablet Commonly known as: ATARAX Take 1 tablet (10 mg total) by mouth 3 (three) times daily as needed.   insulin aspart 100 UNIT/ML injection Commonly known as: NovoLOG Max daily 50  units   INSULIN SYRINGE .5CC/28G 28G X 1/2" 0.5 ML Misc 1 Device by Does not apply route daily in the afternoon.   Global Inject Ease Insulin Syr 30G X 1/2" 1 ML Misc Generic drug: Insulin Syringe-Needle  U-100 USE WITH insulin DAILY in THE afternoon   Insulin Syringes (Disposable) U-100 0.3 ML Misc Use to inject 8 units three times a day   Lantus SoloStar 100 UNIT/ML Solostar Pen Generic drug: insulin glargine Inject 10 Units into the skin at bedtime.   lisinopril-hydrochlorothiazide 20-25 MG tablet Commonly known as: ZESTORETIC Take 1 tablet by mouth daily.   Omnipod 5 G7 Intro (Gen 5) Kit 1 Device by Does not apply route every other day.   Omnipod 5 G7 Pods (Gen 5) Misc 1 Device by Does not apply route every other day.   rosuvastatin 10 MG tablet Commonly known as: Crestor Take 1 tablet (10 mg total) by mouth daily.         ALLERGIES: Allergies  Allergen Reactions   Geodon [Ziprasidone Hcl] Other (See Comments)    Severe muscle spasms (ENTIRE BODY)- had to call EMS   Amlodipine Hives and Swelling    Lower extremities     REVIEW OF SYSTEMS: A comprehensive ROS was conducted with the patient and is negative except as per HPI    OBJECTIVE:   VITAL SIGNS: There were no vitals taken for this visit.   PHYSICAL EXAM:  General: Pt appears well and is in NAD  Lungs: Clear with good BS bilat   Heart: RRR   Abdomen:  soft, nontender  Extremities:  Lower extremities - No pretibial edema.   Neuro: MS is good with appropriate affect, pt is alert and Ox3    DM foot exam: 05/28/2023  The skin of the feet is intact without sores or ulcerations. The pedal pulses are 2+ on right and 2+ on left. The sensation is intact to a screening 5.07, 10 gram monofilament bilaterally    DATA REVIEWED:  Lab Results  Component Value Date   HGBA1C 8.2 (A) 05/28/2023   HGBA1C 9.4 (A) 11/15/2022   HGBA1C 8.9 (H) 08/22/2022    Latest Reference Range & Units 11/19/22 00:43   Sodium 135 - 145 mmol/L 134 (L)  Potassium 3.5 - 5.1 mmol/L 3.8  Chloride 98 - 111 mmol/L 106  CO2 22 - 32 mmol/L 25  Glucose 70 - 99 mg/dL 782 (H)  BUN 6 - 20 mg/dL 25 (H)  Creatinine 9.56 - 1.24 mg/dL 2.13 (H)  Calcium 8.9 - 10.3 mg/dL 8.5 (L)  Anion gap 5 - 15  3 (L)  GFR, Estimated >60 mL/min 52 (L)     Old records , labs and images have been reviewed.   ASSESSMENT / PLAN / RECOMMENDATIONS:   1) Type 1 Diabetes Mellitus, poorly controlled, With CKD III , and neuropathic complications -    -I have reviewed his pump/CGM download, patient has been noted with postprandial hyperglycemia, I will adjust his insulin to carb ratio as below -He also tends to override the pump, I did advise the patient against this practice due to the risk of hypoglycemia, patient to enter BG reading into the pump and follow recommended dose for correction, I will adjust his sensitivity factor as below -He was also encouraged to continue with bolusing right before the meal -I will decrease his basal rate due to hypoglycemia    MEDICATIONS: Novolog     Pump   Omnipod Settings   Insulin type   Novolog   Basal rate       0000 0.3 u/h               I:C ratio       0000 1:13  Sensitivity       0000  55      Goal       0000  120         EDUCATION / INSTRUCTIONS: BG monitoring instructions: Patient is instructed to check his blood sugars 3 times a day, before meals. Call [facility] if: BG persistently < 70  I reviewed the Rule of 15 for the treatment of hypoglycemia in detail with the patient. Literature supplied.   2) Diabetic complications:  Eye: Does not have known diabetic retinopathy.  Neuro/ Feet: Does  have known diabetic peripheral neuropathy. Renal: Patient does  have known baseline CKD. He is not on an ACEI/ARB at present.  3) Dyslipidemia :  -He was on Simvastatin that was disconitnued for no known reason during hospitalization -I  started him on rosuvastatin 12/2022 due to  high LDL at 170 mg/DL -Tolerating well  Medication  Continue rosuvastatin 10 mg daily      Follow-up in 4 months  Signed electronically by: Lyndle Herrlich, MD  [facility]  [facility] [address] [place] [place], [place] [address] Phone: [phone] FAX: [phone]   CC: [name], NP [address] [place] [address] Phone: [phone]  Fax: [phone]    Return to Endocrinology clinic as below: Future Appointments  Date Time Provider Department Center  09/25/2023  9:50 AM Ophie Burrowes, Konrad Dolores, MD [facility] None  10/01/2023  2:50 PM [name], NP [facility] None  10/18/2023 10:00 AM Weber Cooks, LCSW [facility] None

## 2023-09-25 NOTE — Telephone Encounter (Signed)
 Please schedule the patient for follow-up to see me in 4 months   Thanks

## 2023-09-26 NOTE — Telephone Encounter (Signed)
 LMx1 to schedule 4 month follow up with Dr. Lonzo Cloud.

## 2023-09-26 NOTE — Telephone Encounter (Signed)
 Patient is scheduled for 01/28/2024 with Dr. Lonzo Cloud.

## 2023-09-27 ENCOUNTER — Telehealth: Payer: Self-pay | Admitting: Internal Medicine

## 2023-09-27 NOTE — Telephone Encounter (Signed)
 G7 will be placed up front for patient to pick up

## 2023-09-27 NOTE — Telephone Encounter (Signed)
 MEDICATION:  1) Dexcom G7 Sensor Continuous Glucose Sensor (DEXCOM G7 SENSOR) MISC  2)  Omnipod 5 G7 Pods (Gen 5) Insulin Disposable Pump (OMNIPOD 5 G7 PODS, GEN 5,) MISC  PHARMACY:  [facility] - [place], [place] - [address] (Ph: [phone])   HAS THE PATIENT CONTACTED THEIR PHARMACY?  Yes  IS THIS A 90 DAY SUPPLY : Yes  IS PATIENT OUT OF MEDICATION: Almost  IF NOT; HOW MUCH IS LEFT:   LAST APPOINTMENT DATE: @3 /19/2025  NEXT APPOINTMENT DATE:@7 /22/2025  DO WE HAVE YOUR PERMISSION TO LEAVE A DETAILED MESSAGE?: Yes  OTHER COMMENTS: Patient states that he has been using more insulin than usual and that 2 of his Dexcom G7 sensors were bad.  Patient would like to speak with someone.   **Let patient know to contact pharmacy at the end of the day to make sure medication is ready. **  ** Please notify patient to allow 48-72 hours to process**  **Encourage patient to contact the pharmacy for refills or they can request refills through [facility]**

## 2023-09-30 NOTE — Telephone Encounter (Signed)
 Patient picked up.

## 2023-10-01 ENCOUNTER — Encounter (INDEPENDENT_AMBULATORY_CARE_PROVIDER_SITE_OTHER): Payer: Self-pay | Admitting: Primary Care

## 2023-10-01 ENCOUNTER — Ambulatory Visit (INDEPENDENT_AMBULATORY_CARE_PROVIDER_SITE_OTHER): Payer: MEDICAID | Admitting: Primary Care

## 2023-10-01 DIAGNOSIS — E1010 Type 1 diabetes mellitus with ketoacidosis without coma: Secondary | ICD-10-CM | POA: Diagnosis not present

## 2023-10-01 DIAGNOSIS — Z09 Encounter for follow-up examination after completed treatment for conditions other than malignant neoplasm: Secondary | ICD-10-CM | POA: Diagnosis not present

## 2023-10-01 DIAGNOSIS — Z1211 Encounter for screening for malignant neoplasm of colon: Secondary | ICD-10-CM

## 2023-10-01 NOTE — Progress Notes (Signed)
 Subjective:   [name] is a 48 y.o. male presents for ED follow up  09/12/23, patient presented small poodle like a dog on the right thumb lateral area distal PIP tx with rabies immune globulin (HYPERRAB/KEDRAB) injection 1,500 Units (has no administration in time range) rabies vaccine (RABAVERT) injection 1 mL (has no administration in time range) amoxicillin-clavulanate (AUGMENTIN) 875-125 MG per tablet 1 tablet (1 tablet Oral Given 09/12/23 1639) Tdap (BOOSTRIX) . Today he c/o heat rashes until living arrangement that is not something that can be resolved . Bites palm left and thumb right no s/s of infection healing well.  Past Medical History:  Diagnosis Date   Diabetes mellitus    Hypertension    Seizures (HCC)      Allergies  Allergen Reactions   Geodon [Ziprasidone Hcl] Other (See Comments)    Severe muscle spasms (ENTIRE BODY)- had to call EMS   Amlodipine Hives and Swelling    Lower extremities    Current Outpatient Medications on File Prior to Visit  Medication Sig Dispense Refill   Accu-Chek Softclix Lancets lancets Use to check blood sugar up to 6 times daily. 100 each 6   amoxicillin-clavulanate (AUGMENTIN) 875-125 MG tablet Take 1 tablet by mouth every 12 (twelve) hours. 14 tablet 0   Blood Glucose Monitoring Suppl (ACCU-CHEK GUIDE) w/Device KIT Use to check blood sugar up to 6 times daily. 1 kit 0   Blood Pressure Monitoring ([employer]S ADVANCED BP MONITOR) DEVI 1 Device by Does not apply route daily. 1 each 0   cloNIDine (CATAPRES) 0.2 MG tablet Take 1 tablet (0.2 mg total) by mouth daily. 90 tablet 0   Continuous Glucose Sensor (DEXCOM G7 SENSOR) MISC Change sensors every 10 days 9 each 3   gabapentin (NEURONTIN) 300 MG capsule Take 1 capsule (300 mg total) by mouth daily. 30 capsule 0   GLOBAL INJECT EASE INSULIN SYR 30G X 1/2" 1 ML MISC USE WITH insulin DAILY in THE afternoon     glucose blood (ACCU-CHEK GUIDE) test strip USE TO test blood sugar six times EVERY DAY AS  DIRECTED 100 strip 3   hydrALAZINE (APRESOLINE) 10 MG tablet Take 1 tablet (10 mg total) by mouth every 8 (eight) hours. 180 tablet 0   hydrOXYzine (ATARAX) 10 MG tablet Take 1 tablet (10 mg total) by mouth 3 (three) times daily as needed. 30 tablet 0   insulin aspart (NOVOLOG) 100 UNIT/ML injection Max daily 50 units 50 mL 3   Insulin Disposable Pump (OMNIPOD 5 G7 PODS, GEN 5,) MISC 1 Device by Does not apply route every other day. 45 each 2   lisinopril-hydrochlorothiazide (ZESTORETIC) 20-25 MG tablet Take 1 tablet by mouth daily. 90 tablet 1   rosuvastatin (CRESTOR) 10 MG tablet Take 1 tablet (10 mg total) by mouth daily. 90 tablet 3   No current facility-administered medications on file prior to visit.    Review of System: Comprehensive ROS Pertinent positive and negative noted in HPI   Objective:    Physical Exam Vitals reviewed.  HENT:     Head: Normocephalic.     Right Ear: External ear normal.     Left Ear: External ear normal.     Nose: Nose normal.  Eyes:     Extraocular Movements: Extraocular movements intact.     Pupils: Pupils are equal, round, and reactive to light.  Cardiovascular:     Rate and Rhythm: Normal rate and regular rhythm.  Pulmonary:     Effort:  Pulmonary effort is normal.     Breath sounds: Normal breath sounds.  Abdominal:     General: Bowel sounds are normal. There is distension.     Palpations: Abdomen is soft.  Musculoskeletal:        General: Normal range of motion.     Cervical back: Normal range of motion.  Skin:    General: Skin is warm and dry.  Neurological:     Mental Status: He is oriented to person, place, and time.  Psychiatric:        Mood and Affect: Mood normal.        Behavior: Behavior normal.        Thought Content: Thought content normal.        Judgment: Judgment normal.      Assessment:  [name] was seen today for hospitalization follow-up.  Diagnoses and all orders for this visit:  Hospital discharge  follow-up See HPI    Type 1 diabetes mellitus with ketoacidosis without coma (HCC) -     Ambulatory referral to Ophthalmology   Colon cancer screening -     Cancel: Ambulatory referral to Gastroenterology -     Cologuard    This note has been created with Dragon speech recognition software and Paediatric nurse. Any transcriptional errors are unintentional.   Return in about 3 months (around 01/01/2024).  [name], NP 10/07/2023, 3:56 PM

## 2023-10-02 ENCOUNTER — Other Ambulatory Visit (HOSPITAL_COMMUNITY): Payer: Self-pay

## 2023-10-02 ENCOUNTER — Telehealth: Payer: Self-pay

## 2023-10-02 NOTE — Telephone Encounter (Signed)
 Pharmacy Patient Advocate Encounter   Received notification from CoverMyMeds that prior authorization for Dexcom G7 sensor is required/requested.   Insurance verification completed.   The patient is insured through [facility] .   Per test claim: PA required; PA submitted to above mentioned insurance via CoverMyMeds Key/confirmation #/EOC [id number] Status is pending

## 2023-10-04 NOTE — Telephone Encounter (Signed)
 Pharmacy Patient Advocate Encounter  Received notification from [facility] that Prior Authorization for Dexcom G7 sensor has been APPROVED through 10/01/24   PA #/Case ID/Reference #: [id number]

## 2023-10-08 ENCOUNTER — Other Ambulatory Visit: Payer: Self-pay

## 2023-10-08 ENCOUNTER — Other Ambulatory Visit (INDEPENDENT_AMBULATORY_CARE_PROVIDER_SITE_OTHER): Payer: Self-pay | Admitting: Primary Care

## 2023-10-08 MED ORDER — INSULIN ASPART 100 UNIT/ML IJ SOLN
INTRAMUSCULAR | 3 refills | Status: AC
  Filled 2023-10-08: qty 50, fill #0
  Filled 2023-10-31: qty 50, 100d supply, fill #0
  Filled 2023-11-01 – 2023-12-11 (×2): qty 40, 80d supply, fill #0
  Filled 2024-02-21: qty 40, 80d supply, fill #1
  Filled 2024-05-19: qty 40, 80d supply, fill #2
  Filled 2024-08-05: qty 50, 100d supply, fill #3

## 2023-10-08 MED ORDER — DEXCOM G7 SENSOR MISC
3 refills | Status: AC
  Filled 2023-10-08: qty 9, 90d supply, fill #0
  Filled 2023-10-31: qty 3, 30d supply, fill #1
  Filled 2024-01-02: qty 9, 90d supply, fill #1
  Filled 2024-02-21: qty 3, 30d supply, fill #2
  Filled 2024-02-24 (×2): qty 9, 90d supply, fill #2
  Filled 2024-02-27: qty 3, 30d supply, fill #2
  Filled 2024-03-09: qty 9, 90d supply, fill #2
  Filled 2024-05-19: qty 9, 90d supply, fill #3

## 2023-10-08 NOTE — Telephone Encounter (Unsigned)
 Copied from CRM [id number]. Topic: Clinical - Medication Refill >> Oct 08, 2023  2:37 PM Marlow Baars wrote: Most Recent Primary Care Visit:  Provider: [name]  Department[facility]  Visit Type: HOSPITAL FOLLOW UP  Date: 10/01/2023  Medication: Continuous Glucose Sensor (DEXCOM G7 SENSOR) MISC,    Has the patient contacted their pharmacy? No   Is this the correct pharmacy for this prescription? Yes If no, delete pharmacy and type the correct one.  This is the patient's preferred pharmacy:    [facility] [address], [address] [place] [address] Phone: [phone] Fax: [phone]     Has the prescription been filled recently? Yes but he says [facility] will not let him pick them up there and this is why he wants them to be called in here  Is the patient out of the medication? No but he only has 1 left  Has the patient been seen for an appointment in the last year OR does the patient have an upcoming appointment? Yes  Can we respond through MyChart? Yes  Please assist patient further

## 2023-10-10 ENCOUNTER — Other Ambulatory Visit: Payer: Self-pay

## 2023-10-10 NOTE — Telephone Encounter (Signed)
 Disp Refills Start End   Continuous Glucose Sensor (DEXCOM G7 SENSOR) MISC 9 each 3 10/08/2023 --   Sig: Change sensors every 10 days   Sent to pharmacy as: Continuous Glucose Sensor (DEXCOM G7 SENSOR) Misc    Requested Prescriptions  Pending Prescriptions Disp Refills   Continuous Glucose Sensor (DEXCOM G7 SENSOR) MISC 9 each 3    Sig: Change sensors every 10 days     Endocrinology: Diabetes - Testing Supplies Passed - 10/10/2023  5:28 PM      Passed - Valid encounter within last 12 months    Recent Outpatient Visits           1 week ago Hospital discharge follow-up   [place] [facility] [name], NP   1 month ago DM (diabetes mellitus), type 1 with complications [facility])   [place] [facility] [name], NP   1 month ago PTSD (post-traumatic stress disorder)   [place] [facility] [name], NP   4 months ago Adjustment disorder with mixed anxiety and depressed mood   [place] [facility] [name], NP   6 months ago Left arm pain    [facility] [name], NP

## 2023-10-18 ENCOUNTER — Ambulatory Visit (HOSPITAL_COMMUNITY): Payer: MEDICAID | Admitting: Licensed Clinical Social Worker

## 2023-10-18 DIAGNOSIS — F329 Major depressive disorder, single episode, unspecified: Secondary | ICD-10-CM

## 2023-10-18 DIAGNOSIS — F411 Generalized anxiety disorder: Secondary | ICD-10-CM

## 2023-10-18 DIAGNOSIS — F331 Major depressive disorder, recurrent, moderate: Secondary | ICD-10-CM | POA: Insufficient documentation

## 2023-10-18 DIAGNOSIS — F4310 Post-traumatic stress disorder, unspecified: Secondary | ICD-10-CM | POA: Diagnosis not present
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
## 2023-10-21 ENCOUNTER — Ambulatory Visit (INDEPENDENT_AMBULATORY_CARE_PROVIDER_SITE_OTHER): Payer: MEDICAID | Admitting: Psychiatry

## 2023-10-21 ENCOUNTER — Other Ambulatory Visit: Payer: Self-pay

## 2023-10-21 ENCOUNTER — Encounter (HOSPITAL_COMMUNITY): Payer: Self-pay | Admitting: Psychiatry

## 2023-10-21 DIAGNOSIS — G4700 Insomnia, unspecified: Secondary | ICD-10-CM

## 2023-10-21 DIAGNOSIS — F4310 Post-traumatic stress disorder, unspecified: Secondary | ICD-10-CM | POA: Diagnosis not present

## 2023-10-21 DIAGNOSIS — F331 Major depressive disorder, recurrent, moderate: Secondary | ICD-10-CM | POA: Insufficient documentation

## 2023-10-21 DIAGNOSIS — F4323 Adjustment disorder with mixed anxiety and depressed mood: Secondary | ICD-10-CM | POA: Insufficient documentation

## 2023-10-21 DIAGNOSIS — F5104 Psychophysiologic insomnia: Secondary | ICD-10-CM

## 2023-10-21 DIAGNOSIS — Z8659 Personal history of other mental and behavioral disorders: Secondary | ICD-10-CM | POA: Diagnosis not present

## 2023-10-21 MED ORDER — GABAPENTIN 300 MG PO CAPS
ORAL_CAPSULE | ORAL | 1 refills | Status: DC
  Filled 2023-10-21 – 2023-11-01 (×2): qty 60, 30d supply, fill #0
  Filled 2023-12-11: qty 60, 30d supply, fill #1

## 2023-10-21 NOTE — Patient Instructions (Signed)
 Thank you for attending your appointment today.  -- START gabapentin 300 mg nightly; may increase to 600 mg nightly after 1 week if sleep issues persist -- Continue other medications as prescribed.  Please do not make any changes to medications without first discussing with your provider. If you are experiencing a psychiatric emergency, please call 911 or present to your nearest emergency department. Additional crisis, medication management, and therapy resources are included below.  [facility]  [address] [place], [place] [address] [phone] WALK-IN URGENT CARE 24/7 FOR ANYONE [address], [place], [place]  [phone] Fax: [phone] [email or web address] *Interpreters available *Accepts all insurance and uninsured for Urgent Care needs *Accepts Medicaid and uninsured for outpatient treatment (below)      ONLY FOR [facility]  Below:    Outpatient New Patient Assessment/Therapy Walk-ins:        Monday, Wednesday, and Thursday 8am until slots are full (first come, first served)                   New Patient Psychiatry/Medication Management        Monday-Friday 8am-11am (first come, first served)               For all walk-ins we ask that you arrive by 7:15am, because patients will be seen in the order of arrival.

## 2023-10-31 ENCOUNTER — Other Ambulatory Visit: Payer: Self-pay

## 2023-11-01 ENCOUNTER — Other Ambulatory Visit: Payer: Self-pay

## 2023-11-01 MED ORDER — OMNIPOD 5 DEXG7G6 PODS GEN 5 MISC
2 refills | Status: AC
  Filled 2023-11-01: qty 45, 90d supply, fill #0
  Filled 2024-01-28: qty 45, 90d supply, fill #1
  Filled 2024-04-27 – 2024-05-05 (×2): qty 45, 90d supply, fill #2

## 2023-11-04 ENCOUNTER — Other Ambulatory Visit: Payer: Self-pay

## 2023-11-15 ENCOUNTER — Telehealth (INDEPENDENT_AMBULATORY_CARE_PROVIDER_SITE_OTHER): Payer: Self-pay

## 2023-11-15 NOTE — Telephone Encounter (Signed)
 Pt is wanting to know if he can donate plasma while being a dm. Pt is requesting a letter from pcp

## 2023-11-15 NOTE — Telephone Encounter (Signed)
 Copied from CRM [id number]. Topic: General - Other >> Nov 15, 2023 12:02 PM Sophia H wrote: Reason for CRM: Pt has some questions about being able to donate plasma as a diabetic, if able would like to know if he can have something in writing to take with him. Please advise, can be reached via phone # [phone] or my chart.

## 2023-11-17 ENCOUNTER — Encounter (INDEPENDENT_AMBULATORY_CARE_PROVIDER_SITE_OTHER): Payer: Self-pay | Admitting: Primary Care

## 2023-11-26 ENCOUNTER — Telehealth (INDEPENDENT_AMBULATORY_CARE_PROVIDER_SITE_OTHER): Payer: Self-pay

## 2023-11-26 NOTE — Telephone Encounter (Signed)
 Copied from CRM [id number]. Topic: General - Call Back - No Documentation >> Nov 26, 2023  2:23 PM Tiffany S wrote: Reason for CRM: Patient needs medical documentation supporting he can't work in order to get state benefits please call patient to further discuss

## 2023-11-27 ENCOUNTER — Telehealth (INDEPENDENT_AMBULATORY_CARE_PROVIDER_SITE_OTHER): Payer: Self-pay | Admitting: Primary Care

## 2023-11-27 NOTE — Telephone Encounter (Signed)
 Called pt to confirm appt

## 2023-11-27 NOTE — Telephone Encounter (Signed)
 Called pt to confirm appt. Pt did not answer and LVM

## 2023-11-28 ENCOUNTER — Telehealth (HOSPITAL_COMMUNITY): Payer: Self-pay

## 2023-11-28 ENCOUNTER — Ambulatory Visit (INDEPENDENT_AMBULATORY_CARE_PROVIDER_SITE_OTHER): Payer: MEDICAID | Admitting: Licensed Clinical Social Worker

## 2023-11-28 ENCOUNTER — Ambulatory Visit (HOSPITAL_COMMUNITY): Payer: MEDICAID | Admitting: Licensed Clinical Social Worker

## 2023-11-28 DIAGNOSIS — F4323 Adjustment disorder with mixed anxiety and depressed mood: Secondary | ICD-10-CM

## 2023-11-28 DIAGNOSIS — F4310 Post-traumatic stress disorder, unspecified: Secondary | ICD-10-CM

## 2023-11-28 NOTE — Telephone Encounter (Signed)
 Reached out to pt in regards to message pt didn't answer lvm

## 2023-11-28 NOTE — Progress Notes (Signed)
 THERAPIST PROGRESS NOTE  Virtual Visit via Video Note  I connected with [name] on 11/28/23 at 11:00 AM EDT by a video enabled telemedicine application and verified that I am speaking with the correct person using two identifiers.  Location: Patient: [facility]  Provider: Providers Home    I discussed the limitations of evaluation and management by telemedicine and the availability of in person appointments. The patient expressed understanding and agreed to proceed.     I discussed the assessment and treatment plan with the patient. The patient was provided an opportunity to ask questions and all were answered. The patient agreed with the plan and demonstrated an understanding of the instructions.   The patient was advised to call back or seek an in-person evaluation if the symptoms worsen or if the condition fails to improve as anticipated.  I provided 40 minutes of non-face-to-face time during this encounter.   Donald Small, LCSW   Participation Level: Active  Behavioral Response: CasualAlertAnxious and Depressed  Type of Therapy: Individual Therapy  Treatment Goals addressed:  Active     BH CCP Acute or Chronic Trauma Reaction     LTG: Recall traumatic events without becoming overwhelmed with negative emotions     Start:  10/18/23    Expected End:  05/08/24         STG: Donald will cooperate with and complete psychological testing to help evaluate trauma symptoms (Progressing)     Start:  10/18/23    Expected End:  05/08/24         STG: Donald will describe the signs and symptoms of PTSD that are experienced and how they interfere with daily living (Progressing)     Start:  10/18/23    Expected End:  05/08/24         STG: Donald will acknowledge that healing from PTSD is a gradual process     Start:  10/18/23    Expected End:  05/08/24         STG: Donald will identify negative coping strategies that have been used to cope with the feelings  associated with the trauma     Start:  10/18/23    Expected End:  05/08/24         STG: Donald will identify coping strategies to deal with trauma memories and the associated emotional reaction     Start:  10/18/23    Expected End:  05/08/24         STG: Donald will verbalize an increased sense of mastery over PTSD symptoms by using several techniques to cope with flashbacks, decrease the power of triggers, and decrease negative thinking     Start:  10/18/23    Expected End:  05/08/24         [name]      Start:  10/18/23         Refer [name] to a psychiatrist for a consultation regarding medication management of symptoms      Start:  10/18/23         Instruct [name] to communicate effects of prescribed medications (Completed)     Start:  10/18/23    End:  11/28/23      Provide [name] with education on trauma-oriented therapy (Completed)     Start:  10/18/23    End:  11/28/23      Provide and outline the treatment process to Donald, explaining that  it will include a gradual processing of the details and feelings associated with the trauma and developing new, more appropriate coping strategies (Completed)     Start:  10/18/23    End:  11/28/23        OP Depression     LTG: Reduce frequency, intensity, and duration of depression symptoms so that daily functioning is improved (Progressing)     Start:  10/18/23    Expected End:  05/08/24         LTG: Increase coping skills to manage depression and improve ability to perform daily activities (Progressing)     Start:  10/18/23    Expected End:  05/08/24         LTG: Donald will score less than 5 on the Patient Health Questionnaire (PHQ-9)      Start:  10/18/23    Expected End:  05/08/24         STG: Donald will participate in at least 80% of scheduled individual psychotherapy sessions  (Progressing)     Start:  10/18/23    Expected  End:  05/08/24         STG: Donald will complete at least 80% of assigned homework  (Progressing)     Start:  10/18/23    Expected End:  05/08/24         STG: Donald will reduce frequency of avoidant behaviors by 50% as evidenced by self-report in therapy sessions     Start:  10/18/23    Expected End:  05/08/24            ProgressTowards Goals: Progressing  Interventions: CBT, Motivational Interviewing, and Supportive   Suicidal/Homicidal: Nowithout intent/plan  Therapist Response:     [name] was alert and oriented x 5.  He was pleasant, cooperative, maintained good eye contact.  Patient presented today with anxious and irritable mood\affect.  He was dressed casually and engaged well in therapy session.  Tel reports primary stressors as his separation from his wife.  Patient also reports stressors for his current housing situation.  Donald states that he has problems setting boundaries and prioritizing himself.  He reports that he is scared that if he gets his disability approved prior to finalizing his divorce he will go close while he at that money.  He reports that he is exhausted all his resources, and everyone wants some kind of fee that he does not Currently.  LCSW and patient spoke with solutions of possible payment plan which patient is willing to explore with different attorneys.  Other stressors for patient are the housing situation at his religious housing program.  He reports that there is continuous fighting, and it is a negative environment for him to be in but he needs to have it in order to have housing.  LCSW does note that there was arguing going on in the background during session until the 2 people realized that he was on an appointment.  Patient reports that he needs to find coping skills outside of exercises he has been unable to exercise due to illness of "being allergic to the sun and being unable to sweat".  Patient reports that he is utilize exercise in the past help  decrease anxiety and depression but as of right now he is unable to engage in activities.    LCSW utilize DBT to help patient with TIPP which stands for temperature, intensive exercise, progressive muscle relaxation, and paced breathing.  LCSW also advised patient to utilize gratitude exercises to  help decrease worthlessness feelings and hopelessness feelings.  LCSW provided worksheet for this patient and LCSW and patient reviewed these worksheets in session.  LCSW also utilized psychoanalytic therapy for patient to express thoughts, feelings and concerns and nonjudgmental environment.  LCSW used supportive therapy for praise and encouragement.  LCSW and patient spoke about triggers for anxiety and depression such as illness, housing, and ex relationship.  LCSW and patient reviewed coping strategies listed above to help decrease depression and anxiety.  Plan for patient will be to utilize resources provided and report back on their effectiveness.  Plan: Return again in 4 weeks.  Diagnosis: PTSD (post-traumatic stress disorder)  Adjustment disorder with mixed anxiety and depressed mood  Collaboration of Care: Other None today   Patient/Guardian was advised Release of Information must be obtained prior to any record release in order to collaborate their care with an outside provider. Patient/Guardian was advised if they have not already done so to contact the registration department to sign all necessary forms in order for us  to release information regarding their care.   Consent: Patient/Guardian gives verbal consent for treatment and assignment of benefits for services provided during this visit. Patient/Guardian expressed understanding and agreed to proceed.   Donald Small, LCSW 11/28/2023

## 2023-12-03 NOTE — Telephone Encounter (Addendum)
 Spoke to pt gave him number to [facility] and Email and fax, told him that he would have to have his Job send us  his paper work before we can start on any paper work. Speaking to this pt I'm presuming he will be faxing or sending over paper work pertaining to his job soon. Sending this    Best,    Ja'Bron Rayna Brenner.

## 2023-12-04 ENCOUNTER — Encounter (INDEPENDENT_AMBULATORY_CARE_PROVIDER_SITE_OTHER): Payer: Self-pay | Admitting: Primary Care

## 2023-12-04 ENCOUNTER — Ambulatory Visit (INDEPENDENT_AMBULATORY_CARE_PROVIDER_SITE_OTHER): Payer: MEDICAID | Admitting: Primary Care

## 2023-12-04 ENCOUNTER — Other Ambulatory Visit (HOSPITAL_COMMUNITY)
Admission: RE | Admit: 2023-12-04 | Discharge: 2023-12-04 | Disposition: A | Discharge status: Home / Self Care | Payer: MEDICAID | Source: Ambulatory Visit | Attending: Primary Care | Admitting: Primary Care

## 2023-12-04 VITALS — BP 164/73 | HR 91 | Ht 71.0 in | Wt 191.0 lb

## 2023-12-04 DIAGNOSIS — Z113 Encounter for screening for infections with a predominantly sexual mode of transmission: Secondary | ICD-10-CM

## 2023-12-06 ENCOUNTER — Ambulatory Visit (INDEPENDENT_AMBULATORY_CARE_PROVIDER_SITE_OTHER): Payer: Self-pay | Admitting: Primary Care

## 2023-12-06 LAB — URINE CYTOLOGY ANCILLARY ONLY
Chlamydia: NEGATIVE
Comment: NEGATIVE
Comment: NEGATIVE
Comment: NORMAL
Neisseria Gonorrhea: NEGATIVE
Trichomonas: NEGATIVE

## 2023-12-09 NOTE — Progress Notes (Signed)
 [facility]  [name], is a 48 y.o. male  UEA:[id number]  YNW:[id number]  DOB - 05-Oct-1975  Chief Complaint  Patient presents with   Penis Pain    Sores around head of penis        Subjective:   [name] is a 48 y.o. male here today for an acute visit. States he has sores around his penis /shaft that are raised red bump that are painful.  Today none are present advised when outbreak call the office and will obtain a culture.  No problems updated.  Comprehensive ROS Pertinent positive and negative noted in HPI   Allergies  Allergen Reactions   Geodon [Ziprasidone Hcl] Other (See Comments)    Severe muscle spasms (ENTIRE BODY)- had to call EMS   Amlodipine  Hives and Swelling    Lower extremities    Past Medical History:  Diagnosis Date   Depression    Diabetes mellitus    Diabetes mellitus type I (HCC)    Hypertension    PTSD (post-traumatic stress disorder)    Seizures (HCC)     Current Outpatient Medications on File Prior to Visit  Medication Sig Dispense Refill   Accu-Chek Softclix Lancets lancets Use to check blood sugar up to 6 times daily. 100 each 6   Blood Glucose Monitoring Suppl (ACCU-CHEK GUIDE) w/Device KIT Use to check blood sugar up to 6 times daily. 1 kit 0   Blood Pressure Monitoring ([employer] ADVANCED BP MONITOR) DEVI 1 Device by Does not apply route daily. 1 each 0   Continuous Glucose Sensor (DEXCOM G7 SENSOR) MISC Change sensors every 10 days 9 each 3   gabapentin  (NEURONTIN ) 300 MG capsule Take 1 capsule (300 mg) nightly. If sleep issues persist, may increase to 2 capsules (600 mg) nightly after 1 week 60 capsule 1   GLOBAL INJECT EASE INSULIN  SYR 30G X 1/2" 1 ML MISC USE WITH insulin  DAILY in THE afternoon     glucose blood (ACCU-CHEK GUIDE) test strip USE TO test blood sugar six times EVERY DAY AS DIRECTED 100 strip 3   hydrOXYzine  (ATARAX ) 10 MG tablet Take 1 tablet (10 mg total) by mouth 3 (three) times daily as needed. 30  tablet 0   insulin  aspart (NOVOLOG ) 100 UNIT/ML injection Max daily dose of 50 units 50 mL 3   Insulin  Disposable Pump (OMNIPOD 5 DEXG7G6 PODS GEN 5) MISC Apply one device every other day. 45 each 2   Insulin  Disposable Pump (OMNIPOD 5 G7 PODS, GEN 5,) MISC 1 Device by Does not apply route every other day. 45 each 2   lisinopril -hydrochlorothiazide  (ZESTORETIC ) 20-25 MG tablet Take 1 tablet by mouth daily. 90 tablet 1   amoxicillin -clavulanate (AUGMENTIN ) 875-125 MG tablet Take 1 tablet by mouth every 12 (twelve) hours. (Patient not taking: Reported on 12/04/2023) 14 tablet 0   cloNIDine  (CATAPRES ) 0.2 MG tablet Take 1 tablet (0.2 mg total) by mouth daily. (Patient not taking: Reported on 12/04/2023) 90 tablet 0   hydrALAZINE  (APRESOLINE ) 10 MG tablet Take 1 tablet (10 mg total) by mouth every 8 (eight) hours. (Patient not taking: Reported on 10/21/2023) 180 tablet 0   rosuvastatin  (CRESTOR ) 10 MG tablet Take 1 tablet (10 mg total) by mouth daily. (Patient not taking: Reported on 12/04/2023) 90 tablet 3   No current facility-administered medications on file prior to visit.   Health Maintenance  Topic Date Due   Pneumococcal Vaccination (2 of 2 - PCV) 07/09/2008   Eye exam for diabetics  05/10/2022   Colon Cancer Screening  Never done   COVID-19 Vaccine (1 - 2024-25 season) Never done   Hemoglobin A1C  11/25/2023   Flu Shot  02/07/2024   Complete foot exam   05/27/2024   DTaP/Tdap/Td vaccine (4 - Td or Tdap) 09/11/2033   Hepatitis C Screening  Completed   HIV Screening  Completed   HPV Vaccine  Aged Out   Meningitis B Vaccine  Aged Out    Objective:   Vitals:   12/04/23 1121  BP: (!) 164/73  Pulse: 91  SpO2: 98%  Weight: 191 lb (86.6 kg)  Height: 5\' 11"  (1.803 m)   BP Readings from Last 3 Encounters:  12/04/23 (!) 164/73  09/12/23 (!) 157/86  09/03/23 (!) 180/90      Physical Exam Vitals reviewed.  HENT:     Head: Normocephalic.     Right Ear: Tympanic membrane and external  ear normal.     Left Ear: External ear normal.     Nose: Nose normal.  Eyes:     Extraocular Movements: Extraocular movements intact.  Cardiovascular:     Rate and Rhythm: Normal rate and regular rhythm.  Pulmonary:     Effort: Pulmonary effort is normal.     Breath sounds: Normal breath sounds.  Abdominal:     General: Bowel sounds are normal. There is distension.     Palpations: Abdomen is soft.  Musculoskeletal:        General: Normal range of motion.     Cervical back: Normal range of motion.  Skin:    General: Skin is warm and dry.  Neurological:     Mental Status: He is oriented to person, place, and time.  Psychiatric:        Mood and Affect: Mood normal.        Behavior: Behavior normal.     Assessment & Plan   Screening for STD (sexually transmitted disease) -     Urine cytology ancillary only     Patient have been counseled extensively about nutrition and exercise. Other issues discussed during this visit include: low cholesterol diet, weight control and daily exercise, foot care, annual eye examinations at Ophthalmology, importance of adherence with medications and regular follow-up. We also discussed long term complications of uncontrolled diabetes and hypertension.   No follow-ups on file.  The patient was given clear instructions to go to ER or return to medical center if symptoms don't improve, worsen or new problems develop. The patient verbalized understanding. The patient was told to call to get lab results if they haven't heard anything in the next week.   This note has been created with Education officer, environmental. Any transcriptional errors are unintentional.   Marius Siemens, NP 12/09/2023, 8:56 AM

## 2023-12-11 ENCOUNTER — Other Ambulatory Visit (INDEPENDENT_AMBULATORY_CARE_PROVIDER_SITE_OTHER): Payer: Self-pay | Admitting: Primary Care

## 2023-12-11 ENCOUNTER — Other Ambulatory Visit: Payer: Self-pay

## 2023-12-11 DIAGNOSIS — I152 Hypertension secondary to endocrine disorders: Secondary | ICD-10-CM

## 2023-12-11 NOTE — Telephone Encounter (Signed)
 Copied from CRM [id number]. Topic: Clinical - Medication Refill >> Dec 11, 2023  2:14 PM Turkey B wrote: Medication:  lisinopril -hydrochlorothiazide  (ZESTORETIC ) 20-25 MG tablet/ rosuvastatin  (CRESTOR ) 10 MG tablet   Has the patient contacted their pharmacy? No, said this pharmacist hasn't filed this before  (This is the patient's preferred pharmacy:   [facility] [address] [place] [address] Phone: [phone] Fax: [phone]   Is this the correct pharmacy for this prescription? yes  Has the prescription been filled recently? no  Is the patient out of the medication? yes  Has the patient been seen for an appointment in the last year OR does the patient have an upcoming appointment? yes  Can we respond through MyChart? yes  Agent: Please be advised that Rx refills may take up to 3 business days. We ask that you follow-up with your pharmacy.

## 2023-12-12 ENCOUNTER — Other Ambulatory Visit: Payer: Self-pay

## 2023-12-12 MED ORDER — LISINOPRIL-HYDROCHLOROTHIAZIDE 20-25 MG PO TABS
1.0000 | ORAL_TABLET | Freq: Every day | ORAL | 1 refills | Status: DC
  Filled 2023-12-12: qty 90, 90d supply, fill #0
  Filled 2024-04-07: qty 90, 90d supply, fill #1

## 2023-12-12 NOTE — Telephone Encounter (Signed)
 Requested medications are due for refill today.  yes  Requested medications are on the active medications list.  yes  Last refill. varied  Future visit scheduled.   no  Notes to clinic.  Labs are expired.    Requested Prescriptions  Pending Prescriptions Disp Refills   lisinopril -hydrochlorothiazide  (ZESTORETIC ) 20-25 MG tablet 90 tablet 1    Sig: Take 1 tablet by mouth daily.     Cardiovascular:  ACEI + Diuretic Combos Failed - 12/12/2023  3:31 PM      Failed - Na in normal range and within 180 days    Sodium  Date Value Ref Range Status  11/19/2022 134 (L) 135 - 145 mmol/L Final  10/11/2021 139 134 - 144 mmol/L Final         Failed - K in normal range and within 180 days    Potassium  Date Value Ref Range Status  11/19/2022 3.8 3.5 - 5.1 mmol/L Final         Failed - Cr in normal range and within 180 days    Creat  Date Value Ref Range Status  12/16/2015 1.32 0.60 - 1.35 mg/dL Final   Creatinine, Ser  Date Value Ref Range Status  11/19/2022 1.64 (H) 0.61 - 1.24 mg/dL Final   Creatinine, POC  Date Value Ref Range Status  11/05/2016 50 mg/dL Final   Creatinine, Urine  Date Value Ref Range Status  11/25/2009 148.2 mg/dL Final         Failed - eGFR is 30 or above and within 180 days    GFR, Est African American  Date Value Ref Range Status  12/16/2015 79 >=60 mL/min Final   GFR calc Af Amer  Date Value Ref Range Status  02/26/2020 71 >59 mL/min/1.73 Final    Comment:    **[facility] currently reports eGFR in compliance with the current**   recommendations of the [employer]. [facility] will   update reporting as new guidelines are published from the NKF-ASN   Task force.    GFR, Est Non African American  Date Value Ref Range Status  12/16/2015 68 >=60 mL/min Final    Comment:      The estimated GFR is a calculation valid for adults (>=75 years old) that uses the CKD-EPI algorithm to adjust for age and sex. It is   not to be used for  children, pregnant women, hospitalized patients,    patients on dialysis, or with rapidly changing kidney function. According to the NKDEP, eGFR >89 is normal, 60-89 shows mild impairment, 30-59 shows moderate impairment, 15-29 shows severe impairment and <15 is ESRD.      GFR, Estimated  Date Value Ref Range Status  11/19/2022 52 (L) >60 mL/min Final    Comment:    (NOTE) Calculated using the CKD-EPI Creatinine Equation (2021)    eGFR  Date Value Ref Range Status  10/11/2021 56 (L) >59 mL/min/1.73 Final   EGFR  Date Value Ref Range Status  05/28/2023 30.0  Final    Comment:    Abstracted by HIM         Failed - Last BP in normal range    BP Readings from Last 1 Encounters:  12/04/23 (!) 164/73         Failed - Valid encounter within last 6 months    Recent Outpatient Visits           1 week ago Screening for STD (sexually transmitted disease)   [place] [facility] Marius Siemens, NP   2 months ago Hospital discharge follow-up   [place] [facility] Marius Siemens, NP   3 months ago DM (diabetes mellitus), type 1 with complications [facility])   [place] [facility] Marius Siemens, NP   3 months ago PTSD (post-traumatic stress disorder)   [place] [facility] Marius Siemens, NP   6 months ago Adjustment disorder with mixed anxiety and depressed mood   [place] [facility] Marius Siemens, NP              Passed - Patient is not pregnant       rosuvastatin  (CRESTOR ) 10 MG tablet 90 tablet 3    Sig: Take 1 tablet (10 mg total) by mouth daily.     Cardiovascular:  Antilipid - Statins 2 Failed - 12/12/2023  3:31 PM      Failed - Cr in normal range and within 360 days    Creat  Date Value Ref Range Status  12/16/2015 1.32 0.60 - 1.35 mg/dL Final   Creatinine, Ser  Date Value Ref Range Status  11/19/2022 1.64 (H) 0.61 - 1.24 mg/dL Final    Creatinine, POC  Date Value Ref Range Status  11/05/2016 50 mg/dL Final   Creatinine, Urine  Date Value Ref Range Status  11/25/2009 148.2 mg/dL Final         Failed - Lipid Panel in normal range within the last 12 months    Cholesterol, Total  Date Value Ref Range Status  11/17/2021 228 (H) 100 - 199 mg/dL Final   Cholesterol  Date Value Ref Range Status  08/22/2022 278 (H) 0 - 200 mg/dL Final   LDL Chol Calc (NIH)  Date Value Ref Range Status  11/17/2021 147 (H) 0 - 99 mg/dL Final   LDL Cholesterol  Date Value Ref Range Status  08/22/2022 170 (H) 0 - 99 mg/dL Final    Comment:           Total Cholesterol/HDL:CHD Risk Coronary Heart Disease Risk Table                     Men   Women  1/2 Average Risk   3.4   3.3  Average Risk       5.0   4.4  2 X Average Risk   9.6   7.1  3 X Average Risk  23.4   11.0        Use the calculated Patient Ratio above and the CHD Risk Table to determine the patient's CHD Risk.        ATP III CLASSIFICATION (LDL):  <100     mg/dL   Optimal  629-528  mg/dL   Near or Above                    Optimal  130-159  mg/dL   Borderline  413-244  mg/dL   High  >010     mg/dL   Very High Performed at [facility], [address], [place], [place] [address]    HDL  Date Value Ref Range Status  08/22/2022 83 >40 mg/dL Final  66/44/0347 65 >42 mg/dL Final   Triglycerides  Date Value Ref Range Status  08/22/2022 127 <150 mg/dL Final         Passed - Patient is not pregnant      Passed - Valid encounter within last 12  months    Recent Outpatient Visits           1 week ago Screening for STD (sexually transmitted disease)   [place] [facility] Marius Siemens, NP   2 months ago Hospital discharge follow-up   [place] [facility] Marius Siemens, NP   3 months ago DM (diabetes mellitus), type 1 with complications [facility])   [place] [facility] Marius Siemens,  NP   3 months ago PTSD (post-traumatic stress disorder)   [place] [facility] Marius Siemens, NP   6 months ago Adjustment disorder with mixed anxiety and depressed mood   [place] [facility] Marius Siemens, NP
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
## 2023-12-20 ENCOUNTER — Other Ambulatory Visit: Payer: Self-pay

## 2023-12-23 ENCOUNTER — Other Ambulatory Visit: Payer: Self-pay

## 2023-12-23 ENCOUNTER — Encounter (HOSPITAL_COMMUNITY): Payer: Self-pay | Admitting: Psychiatry

## 2023-12-23 ENCOUNTER — Ambulatory Visit: Payer: Self-pay

## 2023-12-23 ENCOUNTER — Telehealth (INDEPENDENT_AMBULATORY_CARE_PROVIDER_SITE_OTHER): Payer: MEDICAID | Admitting: Psychiatry

## 2023-12-23 DIAGNOSIS — F4310 Post-traumatic stress disorder, unspecified: Secondary | ICD-10-CM

## 2023-12-23 DIAGNOSIS — F4323 Adjustment disorder with mixed anxiety and depressed mood: Secondary | ICD-10-CM

## 2023-12-23 MED ORDER — GABAPENTIN 300 MG PO CAPS
600.0000 mg | ORAL_CAPSULE | Freq: Every day | ORAL | 1 refills | Status: DC
  Filled 2023-12-23 – 2024-01-20 (×2): qty 60, 30d supply, fill #0
  Filled 2024-02-21: qty 60, 30d supply, fill #1

## 2023-12-23 NOTE — Patient Instructions (Signed)

## 2023-12-23 NOTE — Telephone Encounter (Signed)
 FYI Only or Action Required?: FYI only for provider  Patient was last seen in primary care on 12/04/2023 by Marius Siemens, NP. Called Nurse Triage reporting Chest Pain. Symptoms began several days ago. Interventions attempted: Prescription medications: Lisinopril  and Hydralazine . Symptoms are: Patient states it was worse on Thursday but still feeling mild discomfort.  Triage Disposition: Go to ED or PCP/Alternative with Approval  Patient/caregiver understands and will follow disposition?: Yes     Copied from CRM [id number]. Topic: Clinical - Red Word Triage >> Dec 23, 2023 10:43 AM Ethelle Herb L wrote: Red Word that prompted transfer to Nurse Triage: chest pain and accelerated heart rate, wanting clarification on medications Reason for Disposition  [1] Chest pain lasts > 5 minutes AND [2] occurred in past 3 days (72 hours) (Exception: Feels exactly the same as previously diagnosed heartburn and has accompanying sour taste in mouth.)  Answer Assessment - Initial Assessment Questions 1. LOCATION: Where does it hurt?       Right in sternum, not heavy but noticeable. On Thursday it felt like cramp Patient states right now he feels fine, chest is not hurting 2. RADIATION: Does the pain go anywhere else? (e.g., into neck, jaw, arms, back)     No 3. ONSET: When did the chest pain begin? (Minutes, hours or days)      Off and on since Thursday 4. PATTERN: Does the pain come and go, or has it been constant since it started?  Does it get worse with exertion?      Comes and goes 5. DURATION: How long does it last (e.g., seconds, minutes, hours)     Patient is unable to determine 6. SEVERITY: How bad is the pain?  (e.g., Scale 1-10; mild, moderate, or severe)    - MILD (1-3): doesn't interfere with normal activities     - MODERATE (4-7): interferes with normal activities or awakens from sleep    - SEVERE (8-10): excruciating pain, unable to do any normal activities       4 at the  last occurrence  7. CARDIAC RISK FACTORS: Do you have any history of heart problems or risk factors for heart disease? (e.g., angina, prior heart attack; diabetes, high blood pressure, high cholesterol, smoker, or strong family history of heart disease)     Hypertension, diabetic 8. PULMONARY RISK FACTORS: Do you have any history of lung disease?  (e.g., blood clots in lung, asthma, emphysema, birth control pills)     No 9. CAUSE: What do you think is causing the chest pain?     Patient is uncertain. Patient does suffer from hypertension.  10. OTHER SYMPTOMS: Do you have any other symptoms? (e.g., dizziness, nausea, vomiting, sweating, fever, difficulty breathing, cough)       Headache   Denies hx of heart attack. Patient does not have blood pressure cuff at home to monitor BP, educated he needs to get BP cuff asap.   Patient also wants to know he is unable to sweat for the last year or so. He states I haven't been able to workout because I am unable to sweat and it feels like I am getting poked with toothpicks all over my body in the pores where my sweat should be, and it causes itching sensation  Protocols used: Chest Pain-A-AH

## 2023-12-26 ENCOUNTER — Encounter (HOSPITAL_COMMUNITY): Payer: Self-pay

## 2023-12-26 ENCOUNTER — Ambulatory Visit (HOSPITAL_COMMUNITY): Payer: MEDICAID | Admitting: Licensed Clinical Social Worker

## 2024-01-02 ENCOUNTER — Other Ambulatory Visit: Payer: Self-pay

## 2024-01-03 ENCOUNTER — Ambulatory Visit: Payer: Self-pay

## 2024-01-03 ENCOUNTER — Other Ambulatory Visit: Payer: Self-pay

## 2024-01-03 NOTE — Telephone Encounter (Signed)
 FYI Only or Action Required?: FYI only for provider.  Patient was last seen in primary care on 12/04/2023 by Celestia Rosaline SQUIBB, NP. Called Nurse Triage reporting Rash. Symptoms began several months ago. Interventions attempted: Rest, hydration, or home remedies. Symptoms are: gradually worsening.  Triage Disposition: See PCP When Office is Open (Within 3 Days)  Patient/caregiver understands and will follow disposition?: Yes  Copied from CRM [id number]. Topic: Clinical - Red Word Triage >> Jan 03, 2024 10:22 AM Nathanel BROCKS wrote: Red Word that prompted transfer to Nurse Triage:  Pt states when he goes out in the sun he breaks out in hives and when axiety it gets worse. He is in pain. Reason for Disposition  Mild widespread rash  (Exception: Heat rash lasting 3 days or less.)  Answer Assessment - Initial Assessment Questions 1. APPEARANCE of RASH: Describe the rash. (e.g., spots, blisters, raised areas, skin peeling, scaly)     Patient reports small red rash that develop when he is out in the sun.  2. SIZE: How big are the spots? (e.g., tip of pen, eraser, coin; inches, centimeters)     Patient states they are the size of a pimple when they develop 3. LOCATION: Where is the rash located?     Arms, top of head, lower neck-any area that the sun will hit 4. COLOR: What color is the rash? (Note: It is difficult to assess rash color in people with darker-colored skin. When this situation occurs, simply ask the caller to describe what they see.)     red 5. ONSET: When did the rash begin?     Patient states rash has been happening over the last year 6. FEVER: Do you have a fever? If Yes, ask: What is your temperature, how was it measured, and when did it start?     no 7. ITCHING: Does the rash itch? If Yes, ask: How bad is the itch? (Scale 1-10; or mild, moderate, severe)     moderate 8. CAUSE: What do you think is causing the rash?     unsure 9. MEDICINE FACTORS: Have you  started any new medicines within the last 2 weeks? (e.g., antibiotics)      no 10. OTHER SYMPTOMS: Do you have any other symptoms? (e.g., dizziness, headache, sore throat, joint pain)       No current symptoms.   Patient reports an unusual rash that has been occurring over the last year. Patient endorses itching and rash that will appear when patient is in the sun. Patient also reports that he has noticed that he doesn't sweat which causes him to be unable to work out.  Protocols used: Rash or Redness - [facility]

## 2024-01-06 ENCOUNTER — Ambulatory Visit (INDEPENDENT_AMBULATORY_CARE_PROVIDER_SITE_OTHER): Payer: MEDICAID | Admitting: Primary Care

## 2024-01-06 ENCOUNTER — Encounter (INDEPENDENT_AMBULATORY_CARE_PROVIDER_SITE_OTHER): Payer: Self-pay | Admitting: Primary Care

## 2024-01-06 VITALS — BP 190/88 | HR 62 | Resp 16 | Wt 193.2 lb

## 2024-01-06 DIAGNOSIS — N1832 Chronic kidney disease, stage 3b: Secondary | ICD-10-CM | POA: Diagnosis not present

## 2024-01-06 DIAGNOSIS — I152 Hypertension secondary to endocrine disorders: Secondary | ICD-10-CM

## 2024-01-06 DIAGNOSIS — E785 Hyperlipidemia, unspecified: Secondary | ICD-10-CM

## 2024-01-06 DIAGNOSIS — E1069 Type 1 diabetes mellitus with other specified complication: Secondary | ICD-10-CM

## 2024-01-06 NOTE — Progress Notes (Signed)
 Subjective:   [name] is a 48 y.o. male presents for chronic problem in which he states he has not sweated in over a year that he causes a rash and then he gets pruritus.  Due to circumstances out of his control being in a cool environment is not present at this time.  His blood pressure is extremely elevated 200/96 will be repeated.  Patient stated he did not know that he was supposed to be taking lisinopril  hydrochlorothiazide  and clonidine  at bedtime.  Patient has temporal area described as sensitive to touch and movement of head- feels like a thump.(7/10) No chest pain, No abdominal pain - No Nausea, No new weakness tingling or numbness, No Cough - shortness of breath.  Her nurse [name] labs will be drawn for [name] kidney and kidney to be resulted Past Medical History:  Diagnosis Date   Depression    Diabetes mellitus    Diabetes mellitus type I (HCC)    Hypertension    PTSD (post-traumatic stress disorder)    Seizures (HCC)      Allergies  Allergen Reactions   Geodon [Ziprasidone Hcl] Other (See Comments)    Severe muscle spasms (ENTIRE BODY)- had to call EMS   Amlodipine  Hives and Swelling    Lower extremities    Current Outpatient Medications on File Prior to Visit  Medication Sig Dispense Refill   Accu-Chek Softclix Lancets lancets Use to check blood sugar up to 6 times daily. 100 each 6   amoxicillin -clavulanate (AUGMENTIN ) 875-125 MG tablet Take 1 tablet by mouth every 12 (twelve) hours. (Patient not taking: Reported on 12/04/2023) 14 tablet 0   Blood Glucose Monitoring Suppl (ACCU-CHEK GUIDE) w/Device KIT Use to check blood sugar up to 6 times daily. 1 kit 0   Blood Pressure Monitoring ([employer] ADVANCED BP MONITOR) DEVI 1 Device by Does not apply route daily. 1 each 0   cloNIDine  (CATAPRES ) 0.2 MG tablet Take 1 tablet (0.2 mg total) by mouth daily. (Patient not taking: Reported on 12/04/2023) 90 tablet 0   Continuous Glucose Sensor (DEXCOM G7 SENSOR) MISC Change  sensors every 10 days 9 each 3   [START ON 01/10/2024] gabapentin  (NEURONTIN ) 300 MG capsule Take 2 capsules (600 mg total) by mouth at bedtime. 60 capsule 1   GLOBAL INJECT EASE INSULIN  SYR 30G X 1/2 1 ML MISC USE WITH insulin  DAILY in THE afternoon     glucose blood (ACCU-CHEK GUIDE) test strip USE TO test blood sugar six times EVERY DAY AS DIRECTED 100 strip 3   hydrALAZINE  (APRESOLINE ) 10 MG tablet Take 1 tablet (10 mg total) by mouth every 8 (eight) hours. (Patient not taking: Reported on 12/23/2023) 180 tablet 0   insulin  aspart (NOVOLOG ) 100 UNIT/ML injection Max daily dose of 50 units into the skin daily 50 mL 3   Insulin  Disposable Pump (OMNIPOD 5 DEXG7G6 PODS GEN 5) MISC Apply one device every other day. 45 each 2   Insulin  Disposable Pump (OMNIPOD 5 G7 PODS, GEN 5,) MISC 1 Device by Does not apply route every other day. 45 each 2   lisinopril -hydrochlorothiazide  (ZESTORETIC ) 20-25 MG tablet Take 1 tablet by mouth daily. 90 tablet 1   rosuvastatin  (CRESTOR ) 10 MG tablet Take 1 tablet (10 mg total) by mouth daily. (Patient not taking: Reported on 12/23/2023) 90 tablet 3   No current facility-administered medications on file prior to visit.    Review of System: Comprehensive ROS Pertinent positive and negative noted in HPI  Objective:  BP (!) 190/88 (Cuff Size: Normal)   Pulse 62   Resp 16   Wt 193 lb 3.2 oz (87.6 kg)   SpO2 100%   BMI 26.95 kg/m    Physical Exam Vitals reviewed.  HENT:     Head: Normocephalic.     Right Ear: External ear normal.     Left Ear: External ear normal.     Nose: Nose normal.   Eyes:     Extraocular Movements: Extraocular movements intact.     Pupils: Pupils are equal, round, and reactive to light.    Cardiovascular:     Rate and Rhythm: Normal rate and regular rhythm.  Pulmonary:     Effort: Pulmonary effort is normal.     Breath sounds: Normal breath sounds.  Abdominal:     General: Bowel sounds are normal. There is distension.      Palpations: Abdomen is soft.   Musculoskeletal:        General: Normal range of motion.     Cervical back: Normal range of motion.   Skin:    General: Skin is warm and dry.   Neurological:     Mental Status: He is oriented to person, place, and time.   Psychiatric:        Mood and Affect: Mood normal.        Behavior: Behavior normal.        Thought Content: Thought content normal.        Judgment: Judgment normal.      Assessment:  [name] was seen today for hospitalization follow-up.  Diagnoses and all orders for this visit:  [name] was seen today for rash.  Diagnoses and all orders for this visit:  Hypertension due to endocrine disorder BP goal - < 130/80 Explained that having normal blood pressure is the goal and medications are helping to get to goal and maintain normal blood pressure. DIET: Limit salt intake, read nutrition labels to check salt content, limit fried and high fatty foods  Avoid using multisymptom OTC cold preparations that generally contain sudafed which can rise BP. Consult with pharmacist on best cold relief products to use for persons with HTN EXERCISE Discussed incorporating exercise such as walking - 30 minutes most days of the week and can do in 10 minute intervals      Stage 3b chronic kidney disease (HCC) -     CBC with Differential/Platelet -     CMP14+EGFR -     Hemoglobin A1c -     Lipid panel -     Renal Function Panel -     Magnesium  -     Intact PTH (Includes Calcium ) -     Vitamin D , 25-hydroxy -     Microalbumin / creatinine urine ratio  Type 1 diabetes mellitus with hyperlipidemia (HCC) -     CBC with Differential/Platelet -     CMP14+EGFR -     Hemoglobin A1c -     Lipid panel -     Microalbumin / creatinine urine ratio    This note has been created with Education officer, environmental. Any transcriptional errors are unintentional.  Donald SHAUNNA Bohr, NP 01/06/2024, 2:24 PM

## 2024-01-14 ENCOUNTER — Other Ambulatory Visit (INDEPENDENT_AMBULATORY_CARE_PROVIDER_SITE_OTHER): Payer: MEDICAID

## 2024-01-15 LAB — CBC WITH DIFFERENTIAL/PLATELET
Basophils Absolute: 0 x10E3/uL (ref 0.0–0.2)
Basos: 1 %
EOS (ABSOLUTE): 0.2 x10E3/uL (ref 0.0–0.4)
Eos: 3 %
Hematocrit: 42.6 % (ref 37.5–51.0)
Hemoglobin: 13.5 g/dL (ref 13.0–17.7)
Immature Grans (Abs): 0 x10E3/uL (ref 0.0–0.1)
Immature Granulocytes: 0 %
Lymphocytes Absolute: 1.9 x10E3/uL (ref 0.7–3.1)
Lymphs: 28 %
MCH: 28.4 pg (ref 26.6–33.0)
MCHC: 31.7 g/dL (ref 31.5–35.7)
MCV: 90 fL (ref 79–97)
Monocytes Absolute: 0.5 x10E3/uL (ref 0.1–0.9)
Monocytes: 7 %
Neutrophils Absolute: 4.3 x10E3/uL (ref 1.4–7.0)
Neutrophils: 61 %
Platelets: 283 x10E3/uL (ref 150–450)
RBC: 4.76 x10E6/uL (ref 4.14–5.80)
RDW: 13.4 % (ref 11.6–15.4)
WBC: 7 x10E3/uL (ref 3.4–10.8)

## 2024-01-15 LAB — CMP14+EGFR
ALT: 20 IU/L (ref 0–44)
AST: 24 IU/L (ref 0–40)
Albumin: 3.8 g/dL — ABNORMAL LOW (ref 4.1–5.1)
Alkaline Phosphatase: 79 IU/L (ref 44–121)
BUN/Creatinine Ratio: 14 (ref 9–20)
BUN: 30 mg/dL — ABNORMAL HIGH (ref 6–24)
Bilirubin Total: 0.3 mg/dL (ref 0.0–1.2)
CO2: 20 mmol/L (ref 20–29)
Calcium: 9.3 mg/dL (ref 8.7–10.2)
Chloride: 101 mmol/L (ref 96–106)
Creatinine, Ser: 2.08 mg/dL — ABNORMAL HIGH (ref 0.76–1.27)
Globulin, Total: 2.6 g/dL (ref 1.5–4.5)
Glucose: 245 mg/dL — ABNORMAL HIGH (ref 70–99)
Potassium: 4.8 mmol/L (ref 3.5–5.2)
Sodium: 137 mmol/L (ref 134–144)
Total Protein: 6.4 g/dL (ref 6.0–8.5)
eGFR: 39 mL/min/1.73 — ABNORMAL LOW (ref >59)

## 2024-01-15 LAB — LIPID PANEL
Chol/HDL Ratio: 2.7 ratio (ref 0.0–5.0)
Cholesterol, Total: 203 mg/dL — ABNORMAL HIGH (ref 100–199)
HDL: 75 mg/dL (ref >39)
LDL Chol Calc (NIH): 108 mg/dL — ABNORMAL HIGH (ref 0–99)
Triglycerides: 117 mg/dL (ref 0–149)
VLDL Cholesterol Cal: 20 mg/dL (ref 5–40)

## 2024-01-15 LAB — VITAMIN D 25 HYDROXY (VIT D DEFICIENCY, FRACTURES): Vit D, 25-Hydroxy: 14.2 ng/mL — ABNORMAL LOW (ref 30.0–100.0)

## 2024-01-15 LAB — RENAL FUNCTION PANEL: Phosphorus: 3.5 mg/dL (ref 2.8–4.1)

## 2024-01-15 LAB — MICROALBUMIN / CREATININE URINE RATIO
Creatinine, Urine: 83.6 mg/dL
Microalb/Creat Ratio: 2788 mg/g{creat} — ABNORMAL HIGH (ref 0–29)
Microalbumin, Urine: 2330.7 ug/mL

## 2024-01-15 LAB — MAGNESIUM: Magnesium: 2.3 mg/dL (ref 1.6–2.3)

## 2024-01-15 LAB — HEMOGLOBIN A1C
Est. average glucose Bld gHb Est-mCnc: 177 mg/dL
Hgb A1c MFr Bld: 7.8 % — ABNORMAL HIGH (ref 4.8–5.6)

## 2024-01-16 ENCOUNTER — Ambulatory Visit: Payer: Self-pay | Admitting: Primary Care

## 2024-01-16 DIAGNOSIS — E1069 Type 1 diabetes mellitus with other specified complication: Secondary | ICD-10-CM

## 2024-01-16 DIAGNOSIS — E559 Vitamin D deficiency, unspecified: Secondary | ICD-10-CM

## 2024-01-16 MED ORDER — ERGOCALCIFEROL 1.25 MG (50000 UT) PO CAPS
50000.0000 [IU] | ORAL_CAPSULE | ORAL | 0 refills | Status: DC
  Filled 2024-01-16: qty 10, 70d supply, fill #0

## 2024-01-16 MED ORDER — ROSUVASTATIN CALCIUM 20 MG PO TABS
20.0000 mg | ORAL_TABLET | Freq: Every day | ORAL | 1 refills | Status: DC
  Filled 2024-01-16: qty 90, 90d supply, fill #0
  Filled 2024-05-19: qty 90, 90d supply, fill #1

## 2024-01-17 ENCOUNTER — Other Ambulatory Visit: Payer: Self-pay

## 2024-01-18 LAB — INTACT PTH (INCLUDES CALCIUM)

## 2024-01-20 ENCOUNTER — Other Ambulatory Visit: Payer: Self-pay | Admitting: Primary Care

## 2024-01-20 ENCOUNTER — Other Ambulatory Visit: Payer: Self-pay

## 2024-01-20 ENCOUNTER — Other Ambulatory Visit (INDEPENDENT_AMBULATORY_CARE_PROVIDER_SITE_OTHER): Payer: Self-pay | Admitting: Primary Care

## 2024-01-22 ENCOUNTER — Other Ambulatory Visit: Payer: Self-pay

## 2024-01-22 NOTE — Telephone Encounter (Signed)
 Unable to refill per protocol, Rx expired. Discontinued 01/06/24.  Requested Prescriptions  Pending Prescriptions Disp Refills   hydrALAZINE  (APRESOLINE ) 10 MG tablet 180 tablet 0    Sig: Take 1 tablet (10 mg total) by mouth every 8 (eight) hours.     Cardiovascular:  Vasodilators Failed - 01/22/2024 10:34 AM      Failed - ANA Screen, Ifa, Serum in normal range and within 360 days    No results found for: ANA, ANATITER, LABANTI       Failed - Last BP in normal range    BP Readings from Last 1 Encounters:  01/06/24 (!) 190/88         Passed - HCT in normal range and within 360 days    Hematocrit  Date Value Ref Range Status  01/06/2024 42.6 37.5 - 51.0 % Final         Passed - HGB in normal range and within 360 days    Hemoglobin  Date Value Ref Range Status  01/06/2024 13.5 13.0 - 17.7 g/dL Final         Passed - RBC in normal range and within 360 days    RBC  Date Value Ref Range Status  01/06/2024 4.76 4.14 - 5.80 x10E6/uL Final  11/17/2022 5.44 4.22 - 5.81 MIL/uL Final         Passed - WBC in normal range and within 360 days    WBC  Date Value Ref Range Status  01/06/2024 7.0 3.4 - 10.8 x10E3/uL Final  11/17/2022 6.0 4.0 - 10.5 K/uL Final         Passed - PLT in normal range and within 360 days    Platelets  Date Value Ref Range Status  01/06/2024 283 150 - 450 x10E3/uL Final         Passed - Valid encounter within last 12 months    Recent Outpatient Visits           2 weeks ago Hypertension due to endocrine disorder   [place] [facility] Celestia Rosaline SQUIBB, NP   1 month ago Screening for STD (sexually transmitted disease)   [place] [facility] Celestia Rosaline SQUIBB, NP   3 months ago Hospital discharge follow-up   [place] [facility] Celestia Rosaline SQUIBB, NP   4 months ago DM (diabetes mellitus), type 1 with complications [facility])   [place] [facility] Celestia Rosaline SQUIBB, NP    4 months ago PTSD (post-traumatic stress disorder)   [place] [facility] Celestia Rosaline SQUIBB, NP

## 2024-01-23 ENCOUNTER — Other Ambulatory Visit: Payer: Self-pay

## 2024-01-23 ENCOUNTER — Ambulatory Visit (HOSPITAL_COMMUNITY): Payer: MEDICAID | Admitting: Licensed Clinical Social Worker

## 2024-01-23 DIAGNOSIS — F4323 Adjustment disorder with mixed anxiety and depressed mood: Secondary | ICD-10-CM | POA: Diagnosis not present

## 2024-01-23 DIAGNOSIS — F4310 Post-traumatic stress disorder, unspecified: Secondary | ICD-10-CM

## 2024-01-23 NOTE — Progress Notes (Addendum)
 THERAPIST PROGRESS NOTE  Virtual Visit via Video Note  I connected with [name] on 01/23/24 at  8:00 AM EDT by a video enabled telemedicine application and verified that I am speaking with the correct person using two identifiers.  Location: Patient: [name]  Provider: Providers Home    I discussed the limitations of evaluation and management by telemedicine and the availability of in person appointments. The patient expressed understanding and agreed to proceed.     I discussed the assessment and treatment plan with the patient. The patient was provided an opportunity to ask questions and all were answered. The patient agreed with the plan and demonstrated an understanding of the instructions.   The patient was advised to call back or seek an in-person evaluation if the symptoms worsen or if the condition fails to improve as anticipated.  I provided 55 minutes of non-face-to-face time during this encounter.   Juliene GORMAN Patee, LCSW   Participation Level: Active  Behavioral Response: CasualAlertAnxious and Depressed  Type of Therapy: Individual Therapy  Treatment Goals addressed:  Active     BH CCP Acute or Chronic Trauma Reaction     LTG: Recall traumatic events without becoming overwhelmed with negative emotions     Start:  10/18/23    Expected End:  05/08/24         STG: Baer will cooperate with and complete psychological testing to help evaluate trauma symptoms (Progressing)     Start:  10/18/23    Expected End:  05/08/24         STG: Ernest will describe the signs and symptoms of PTSD that are experienced and how they interfere with daily living (Progressing)     Start:  10/18/23    Expected End:  05/08/24         STG: Kace will acknowledge that healing from PTSD is a gradual process     Start:  10/18/23    Expected End:  05/08/24         STG: Gurfateh will identify negative coping strategies that have been used to cope with the feelings  associated with the trauma     Start:  10/18/23    Expected End:  05/08/24         STG: Jakhari will identify coping strategies to deal with trauma memories and the associated emotional reaction     Start:  10/18/23    Expected End:  05/08/24         STG: Isaac will verbalize an increased sense of mastery over PTSD symptoms by using several techniques to cope with flashbacks, decrease the power of triggers, and decrease negative thinking     Start:  10/18/23    Expected End:  05/08/24         [name]      Start:  10/18/23         Refer [place] to a psychiatrist for a consultation regarding medication management of symptoms      Start:  10/18/23         Instruct [place] to communicate effects of prescribed medications (Completed)     Start:  10/18/23    End:  11/28/23      Provide Olivia with education on trauma-oriented therapy (Completed)     Start:  10/18/23    End:  11/28/23      Provide and outline the treatment process to Ezequias, explaining  that it will include a gradual processing of the details and feelings associated with the trauma and developing new, more appropriate coping strategies (Completed)     Start:  10/18/23    End:  11/28/23        OP Depression     LTG: Reduce frequency, intensity, and duration of depression symptoms so that daily functioning is improved (Progressing)     Start:  10/18/23    Expected End:  05/08/24         LTG: Increase coping skills to manage depression and improve ability to perform daily activities (Progressing)     Start:  10/18/23    Expected End:  05/08/24         LTG: Isiah will score less than 5 on the Patient Health Questionnaire (PHQ-9)      Start:  10/18/23    Expected End:  05/08/24         STG: Dimetrius will participate in at least 80% of scheduled individual psychotherapy sessions  (Progressing)     Start:  10/18/23    Expected  End:  05/08/24         STG: Jonothan will complete at least 80% of assigned homework  (Progressing)     Start:  10/18/23    Expected End:  05/08/24         STG: Adal will reduce frequency of avoidant behaviors by 50% as evidenced by self-report in therapy sessions     Start:  10/18/23    Expected End:  05/08/24            ProgressTowards Goals: Progressing  Interventions: CBT, Motivational Interviewing, and Supportive   Suicidal/Homicidal: Nowithout intent/plan  Therapist Response:       S: Subjective Emad reports ongoing symptoms of racing thoughts, feelings of worthlessness and hopelessness, tension, worry, and insomnia. He states that he has been struggling with a lack of physical exercise, which he believes contributes to his difficulty sleeping. Bynum describes being unable to slow down his thoughts when attempting to fall asleep and reports averaging less than four hours of sleep per night.  He notes that his current medications have been somewhat beneficial, though he continues to experience the symptoms listed above. Quron identifies sleep disturbance and mental overactivity as primary concerns at this time.  O: Objective Patient was alert and oriented 5, pleasant, cooperative, and maintained good eye contact throughout the session. He was casually dressed and engaged well in the therapy session. Mood and affect were noted to be anxious and depressed.  A: Assessment Tiler presents with symptoms consistent with anxiety and depression, characterized by rumination, tension, insomnia, and low mood. Ongoing sleep disturbance appears related to cognitive overactivation and decreased physical activity. Insight and motivation for behavioral change are present, as evidenced by his engagement and willingness to explore strategies for improved sleep hygiene and self-care.  Protective factors include treatment adherence, engagement in therapy, and openness to feedback and  psychoeducation.  P: Plan / Interventions  LCSW provided psychoeducation utilizing the "Waking Up Refreshed" worksheet to support the development of healthy sleep habits.  Reviewed strategies for improving sleep hygiene, including maintaining a consistent sleep schedule, avoiding hitting snooze, using soothing alarm sounds, and identifying positive morning motivators (e.g., coffee, podcast).  Educated patient on the benefits of gratitude exercises to promote positive thinking and emotional regulation.  Discussed the distinction between mental stimulation and physical stimulation to help reduce evening energy levels.  Utilized supportive therapy to provide praise, encouragement, and  emotional validation.  Applied psychoanalytic techniques to allow expression of underlying thoughts, feelings, and emotions within a nonjudgmental environment.  Continued plan to monitor mood and sleep patterns; encourage implementation of learned coping skills and consistent physical activity.  Plan: Return again in 3 weeks.  Diagnosis: Adjustment disorder with mixed anxiety and depressed mood  PTSD (post-traumatic stress disorder)  Collaboration of Care: Other None today   Patient/Guardian was advised Release of Information must be obtained prior to any record release in order to collaborate their care with an outside provider. Patient/Guardian was advised if they have not already done so to contact the registration department to sign all necessary forms in order for us  to release information regarding their care.   Consent: Patient/Guardian gives verbal consent for treatment and assignment of benefits for services provided during this visit. Patient/Guardian expressed understanding and agreed to proceed.   Juliene GORMAN Patee, LCSW 01/23/2024

## 2024-01-27 LAB — STATUS REPORT

## 2024-01-28 ENCOUNTER — Encounter: Payer: Self-pay | Admitting: Internal Medicine

## 2024-01-28 ENCOUNTER — Ambulatory Visit: Payer: MEDICAID | Admitting: Internal Medicine

## 2024-01-28 ENCOUNTER — Other Ambulatory Visit: Payer: Self-pay

## 2024-01-28 ENCOUNTER — Other Ambulatory Visit (HOSPITAL_COMMUNITY): Payer: Self-pay

## 2024-01-28 VITALS — BP 130/80 | HR 62 | Ht 71.0 in | Wt 197.0 lb

## 2024-01-28 DIAGNOSIS — E1022 Type 1 diabetes mellitus with diabetic chronic kidney disease: Secondary | ICD-10-CM | POA: Diagnosis not present

## 2024-01-28 DIAGNOSIS — E1040 Type 1 diabetes mellitus with diabetic neuropathy, unspecified: Secondary | ICD-10-CM

## 2024-01-28 DIAGNOSIS — E1065 Type 1 diabetes mellitus with hyperglycemia: Secondary | ICD-10-CM | POA: Diagnosis not present

## 2024-01-28 DIAGNOSIS — E785 Hyperlipidemia, unspecified: Secondary | ICD-10-CM | POA: Diagnosis not present

## 2024-01-28 DIAGNOSIS — N1831 Chronic kidney disease, stage 3a: Secondary | ICD-10-CM

## 2024-01-28 NOTE — Patient Instructions (Addendum)
 Enter 52 grams with a regular meal with each meal     - HOW TO TREAT LOW BLOOD SUGARS (Blood sugar LESS THAN 70 MG/DL) Please follow the RULE OF 15 for the treatment of hypoglycemia treatment (when your (blood sugars are less than 70 mg/dL)   STEP 1: Take 15 grams of carbohydrates when your blood sugar is low, which includes:  3-4 GLUCOSE TABS  OR 3-4 OZ OF JUICE OR REGULAR SODA OR ONE TUBE OF GLUCOSE GEL    STEP 2: RECHECK blood sugar in 15 MINUTES STEP 3: If your blood sugar is still low at the 15 minute recheck --> then, go back to STEP 1 and treat AGAIN with another 15 grams of carbohydrates.
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
## 2024-01-30 LAB — INTACT PTH (INCLUDES CALCIUM): Calcium, Serum: 9.4 mg/dL

## 2024-01-30 LAB — SPECIMEN STATUS REPORT

## 2024-01-31 ENCOUNTER — Encounter: Payer: Self-pay | Admitting: Primary Care

## 2024-02-12 ENCOUNTER — Telehealth (INDEPENDENT_AMBULATORY_CARE_PROVIDER_SITE_OTHER): Payer: Self-pay | Admitting: Primary Care

## 2024-02-12 NOTE — Telephone Encounter (Signed)
 Copied from CRM #[id number]. Topic: General - Other >> Feb 12, 2024  1:07 PM Turkey B wrote: Reason for CRM: Patient called in about Dr Celestia writing a letter to help him get a free membership too the [employer] to help him with his health as it was helping previously

## 2024-02-12 NOTE — Progress Notes (Unsigned)
 Patient did not connect for virtual psychiatric medication management appointment on 02/13/24 at 3PM. Sent secure video link with no response. Called phone with no answer; left VM with callback number to reschedule.  LAURAINE DELENA PUMMEL, MD 02/13/24

## 2024-02-13 ENCOUNTER — Encounter (HOSPITAL_COMMUNITY): Payer: Self-pay

## 2024-02-13 ENCOUNTER — Encounter (HOSPITAL_COMMUNITY): Payer: MEDICAID | Admitting: Psychiatry

## 2024-02-17 NOTE — Telephone Encounter (Signed)
 Patient called to get an update on his request to have pcp write a letter to see if he could get a free membership to the [facility]. Please advise.

## 2024-02-20 ENCOUNTER — Ambulatory Visit (HOSPITAL_COMMUNITY)
Admission: EM | Admit: 2024-02-20 | Discharge: 2024-02-20 | Disposition: A | Discharge status: Home / Self Care | Payer: MEDICAID | Attending: Psychiatry | Admitting: Psychiatry

## 2024-02-20 ENCOUNTER — Other Ambulatory Visit: Payer: Self-pay

## 2024-02-20 ENCOUNTER — Ambulatory Visit (HOSPITAL_COMMUNITY): Payer: MEDICAID | Admitting: Licensed Clinical Social Worker

## 2024-02-20 DIAGNOSIS — G4700 Insomnia, unspecified: Secondary | ICD-10-CM | POA: Insufficient documentation

## 2024-02-20 DIAGNOSIS — F4310 Post-traumatic stress disorder, unspecified: Secondary | ICD-10-CM | POA: Insufficient documentation

## 2024-02-20 DIAGNOSIS — E1022 Type 1 diabetes mellitus with diabetic chronic kidney disease: Secondary | ICD-10-CM | POA: Insufficient documentation

## 2024-02-20 DIAGNOSIS — F329 Major depressive disorder, single episode, unspecified: Secondary | ICD-10-CM | POA: Insufficient documentation

## 2024-02-20 DIAGNOSIS — Z9152 Personal history of nonsuicidal self-harm: Secondary | ICD-10-CM | POA: Insufficient documentation

## 2024-02-20 DIAGNOSIS — N189 Chronic kidney disease, unspecified: Secondary | ICD-10-CM | POA: Insufficient documentation

## 2024-02-20 DIAGNOSIS — Z794 Long term (current) use of insulin: Secondary | ICD-10-CM | POA: Insufficient documentation

## 2024-02-20 DIAGNOSIS — I129 Hypertensive chronic kidney disease with stage 1 through stage 4 chronic kidney disease, or unspecified chronic kidney disease: Secondary | ICD-10-CM | POA: Insufficient documentation

## 2024-02-20 DIAGNOSIS — E559 Vitamin D deficiency, unspecified: Secondary | ICD-10-CM | POA: Insufficient documentation

## 2024-02-20 DIAGNOSIS — G40909 Epilepsy, unspecified, not intractable, without status epilepticus: Secondary | ICD-10-CM | POA: Insufficient documentation

## 2024-02-20 DIAGNOSIS — F4323 Adjustment disorder with mixed anxiety and depressed mood: Secondary | ICD-10-CM | POA: Insufficient documentation

## 2024-02-20 MED ORDER — HYDROXYZINE HCL 25 MG PO TABS
25.0000 mg | ORAL_TABLET | Freq: Three times a day (TID) | ORAL | 0 refills | Status: DC | PRN
Start: 2024-02-20 — End: 2024-05-04
  Filled 2024-02-20: qty 90, 30d supply, fill #0
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
## 2024-02-20 NOTE — ED Provider Notes (Signed)
 Behavioral Health Urgent Care Medical Screening Exam  Patient Name: [name] MRN: [id number] Date of Evaluation: 02/20/24 Chief Complaint:  I have been feeling anxious and irritable lately.  Diagnosis:  Final diagnoses:  Adjustment disorder with mixed anxiety and depressed mood    History of Present illness: [name] 48 y.o., male patient presented to [facility] as a voluntary walk in unaccompanied with complaints of increased anxiety and irritability.  [name], is seen face to face by this provider and chart reviewed on 02/20/24. Per chart review, pt has a PPHx of adjustment d/o, PTSD, insomnia and MDD.  Medical history pertinent for type 1 diabetes, hypertension, history of DKA, chronic kidney disease, vitamin D  deficiency and history of seizure disorder.  He is currently being followed outpatient by Dr.Bahraini at [facility] where he receives medication management and outpatient therapy.  Patient missed appointment today and has a rescheduled appointment in October.  Last inpatient admission was in 2018 for SI.    On evaluation [name] reports that he missed his appointment today upstairs with [facility].  He reports that for the last couple weeks he has been feeling more on edge and irritable.  He states that he is used to everyone walking all over him and feels that suppressing his feelings for so long has been catching up to him.  He reports not being able to sleep much, racing thoughts, easily agitated, tension, and new feelings of nervousness/worry, especially in social settings which patient reports is rather new for him.  Patient states that he is now living with a family friend because he is unable to work and is receiving disability for his medical diagnoses.  He reports that even though he has a place to live this is the worst case scenario because he has no control over his environment there.  Patient reports he has a 23 year old son who he can only see from time  to time for 20 minutes at a time because he does not want him over at the friend's house due to the friend being disabled.  Patient reports that when the health problems started and then around 2018, he was unable to work which led to him losing his wife along with his son and patient's mental health seem to begin declining at that time.  He denies history of suicide attempts.  He reports self injures behaviors in 2022 by cutting.  He denies any recent aggressive behaviors.  Patient states that he just needs something to help him calm down at times until he can see his outpatient provider.  We discussed trialing hydroxyzine  25 mg 3 times daily as needed for increased anxiety and irritability.  We also discussed that this may help him get better sleep at night.  Patient is open to starting this medication and will reach out to [facility] to get a earlier appointment for medication management.  Patient is encouraged to return to [facility] if behaviors become physically aggressive, having suicidal ideations or homicidal ideations which he agrees to do.  He is able to contract for safety upon discharge.   During evaluation [name] is sitting up in assessment room, in no acute distress.  He is alert & oriented x 4, somewhat irritable but very cooperative and attentive for this assessment.  His mood is anxious with congruent affect.  He has normal speech, and behavior.  Objectively there is no evidence of psychosis/mania or delusional thinking. Pt does not appear  to be responding to internal or external stimuli.  Patient is able to converse coherently, goal directed thoughts, no distractibility, or pre-occupation.  He also denies current suicidal/self-harm/homicidal ideation, psychosis, and paranoia.  Patient answered assessment questions appropriately.       Flowsheet Row ED from 02/20/2024 in [facility] Counselor from 10/18/2023 in [facility] ED  from 09/12/2023 in [facility] Emergency Department at [facility]  C-SSRS RISK CATEGORY Moderate Risk Moderate Risk No Risk    Psychiatric Specialty Exam  Presentation  General Appearance:Appropriate for Environment  Eye Contact:Good  Speech:Clear and Coherent  Speech Volume:Normal  Handedness:Right   Mood and Affect  Mood: Anxious; Dysphoric  Affect: Appropriate   Thought Process  Thought Processes: Coherent; Linear  Descriptions of Associations:Intact  Orientation:Full (Time, Place and Person)  Thought Content:WDL  Diagnosis of Schizophrenia or Schizoaffective disorder in past: No   Hallucinations:None  Ideas of Reference:None  Suicidal Thoughts:No  Homicidal Thoughts:No   Sensorium  Memory: Immediate Good; Recent Good  Judgment: Good  Insight: Good   Executive Functions  Concentration: Good  Attention Span: Good  Recall: Good  Fund of Knowledge: Good  Language: Good   Psychomotor Activity  Psychomotor Activity: Normal   Assets  Assets: Desire for Improvement; Housing; Social Support; Manufacturing systems engineer; Resilience; Vocational/Educational   Sleep  Sleep: Fair  Number of hours:  6   Physical Exam: Physical Exam Vitals and nursing note reviewed.  Constitutional:      Appearance: Normal appearance.  HENT:     Head: Normocephalic.     Nose: Nose normal.  Eyes:     Extraocular Movements: Extraocular movements intact.  Cardiovascular:     Rate and Rhythm: Normal rate.  Pulmonary:     Effort: Pulmonary effort is normal.  Musculoskeletal:        General: Normal range of motion.     Cervical back: Normal range of motion.  Neurological:     General: No focal deficit present.     Mental Status: He is alert and oriented to person, place, and time.    Review of Systems  Constitutional: Negative.   HENT: Negative.    Eyes: Negative.   Respiratory: Negative.    Cardiovascular: Negative.    Gastrointestinal: Negative.   Genitourinary: Negative.   Musculoskeletal: Negative.   Neurological: Negative.   Endo/Heme/Allergies: Negative.   Psychiatric/Behavioral:  The patient is nervous/anxious.    Blood pressure (!) 166/86, pulse 65, temperature 98 F (36.7 C), temperature source Oral, resp. rate 18, SpO2 95%. There is no height or weight on file to calculate BMI.  Musculoskeletal: Strength & Muscle Tone: within normal limits Gait & Station: normal Patient leans: N/A   [facility] MSE Discharge Disposition for Follow up and Recommendations: Based on my evaluation the patient does not appear to have an emergency medical condition and can be discharged with resources and follow up care in outpatient services for Medication Management and Individual Therapy Pt discharged from [facility] with plans to continue follow up with [facility] outpatient services.  He has a medication management appointment on 10 /15 /25 but is encouraged to reach out tomorrow to see if there is a sooner appointment.  30-day supply of hydroxyzine  25 mg 3 times daily as needed for anxiety/irritability sent to pharmacy to bridge him through until he can see his outpatient provider.  Patient educated on medication side effects, dosing and administration.  Patient contracts her safety upon discharge and agrees  to return to nearest ED or [facility] UC if symptoms worsen or is unable to keep self or others safe.  Alan JAYSON Mcardle, NP 02/20/2024, 6:37 PM

## 2024-02-20 NOTE — Discharge Instructions (Addendum)
 Discharge recommendations:   Medications: 30 day supply of Hydroxyzine  25mg  as needed for anxiety and irritability, may take up to 3 times a day sent to pharmacy. Patient is to take medications as prescribed. The patient or patient's guardian is to contact a medical professional and/or outpatient provider to address any new side effects that develop. The patient or the patient's guardian should update outpatient providers of any new medications and/or medication changes.    Outpatient Follow up: Please call to schedule an earlier appointment with Dr. Mercy. Next appointment is currently scheulded for 04/22/24. Please review list of outpatient resources for psychiatry and counseling. Please follow up with your primary care provider for all medical related needs.    Therapy: Please follow up with therapy at [facility] on 04/20/24 with Dr. Goldammer. We recommend that patient participate in individual therapy to address mental health concerns.   Safety:   The following safety precautions should be taken:   No sharp objects. This includes scissors, razors, scrapers, and putty knives.   Chemicals should be removed and locked up.   Medications should be removed and locked up.   Weapons should be removed and locked up. This includes firearms, knives and instruments that can be used to cause injury.   The patient should abstain from use of illicit substances/drugs and abuse of any medications.  If symptoms worsen or do not continue to improve or if the patient becomes actively suicidal or homicidal then it is recommended that the patient return to the closest hospital emergency department, the [facility], or call 911 for further evaluation and treatment. National Suicide Prevention Lifeline 1-800-SUICIDE or (249)653-2422.  About 988 988 offers 24/7 access to trained crisis counselors who can help people experiencing mental health-related distress. People can call or  text 988 or chat 988lifeline.org for themselves or if they are worried about a loved one who may need crisis support.

## 2024-02-20 NOTE — Progress Notes (Signed)
   02/20/24 1535  [place] Suicide Severity Rating Scale  1. In the past month -  Have you wished you were dead or wished you could go to sleep and not wake up? No  2. In the past month - Have you actually had any thoughts of killing yourself? No  6. Have you ever done anything, started to do anything, or prepared to do anything to end your life? Yes  7. Was this within the past three months? No  C-SSRS RISK CATEGORY Moderate Risk  Patient location: [facility] Urgent Care/Facility Based Crisis Center  [facility] Urgent Care/Facility Based Crisis Center Suicide Precautions Interventions  [facility] Suicide Precautions Interventions Low Risk Interventions

## 2024-02-21 ENCOUNTER — Other Ambulatory Visit: Payer: Self-pay

## 2024-02-24 ENCOUNTER — Other Ambulatory Visit: Payer: Self-pay

## 2024-02-27 ENCOUNTER — Other Ambulatory Visit: Payer: Self-pay

## 2024-02-27 ENCOUNTER — Telehealth: Payer: Self-pay | Admitting: Dietician

## 2024-02-27 NOTE — Telephone Encounter (Signed)
 Returned patient call. He is having problems with increased sweat and the CGM's coming off.  He states that he is using his last sensor and needs the Dexcom number. He has also ordered waterproof patches for the CGM and PODs.  Provided the Dexcom support line number. Told patient that others have used the Cavilon wipes and these can be helpful to keep the CGM and POD on. Added patient to the [facility] 1 Support group list.  Leita Constable, RD, LDN, CDCES, DipACLM

## 2024-02-28 ENCOUNTER — Other Ambulatory Visit: Payer: Self-pay

## 2024-03-06 ENCOUNTER — Ambulatory Visit: Payer: Self-pay

## 2024-03-06 ENCOUNTER — Other Ambulatory Visit (INDEPENDENT_AMBULATORY_CARE_PROVIDER_SITE_OTHER): Payer: Self-pay | Admitting: Primary Care

## 2024-03-06 DIAGNOSIS — K0889 Other specified disorders of teeth and supporting structures: Secondary | ICD-10-CM

## 2024-03-06 NOTE — Telephone Encounter (Signed)
  FYI Only or Action Required?: Action required by provider: referral request. For dentistry  Patient was last seen in primary care on 01/06/2024 by Celestia Rosaline SQUIBB, NP.  Called Nurse Triage reporting Dental Pain.  Symptoms began a year ago.  Interventions attempted: Other: oral care.  Symptoms are: gradually worsening.  Triage Disposition: Call Dentist When Office is Open  Patient/caregiver understands and will follow disposition?: Yes  Copied from CRM #[id number]. Topic: Clinical - Red Word Triage >> Mar 06, 2024  3:27 PM Jayma L wrote: Red Word that prompted transfer to Nurse Triage:   patient called in stated he has a split tooth in back of mouth on bottom right side and its making the right said of his face swell up . Asking for medicine to help or a referral to a dentist if pcp can do that. Said he tastes blood Reason for Disposition  Cracked or chipped tooth  Answer Assessment - Initial Assessment Questions 1. LOCATION: Which tooth is hurting?  (e.g., right-side/left-side, upper/lower, front/back)     Right bottom tooth cracked 2. ONSET: When did the toothache start?  (e.g., hours, days)      Tooth split at least one year ago 3. SEVERITY: How bad is the toothache?  (Scale 1-10; mild, moderate or severe)     Denies pain 4. SWELLING: Is there any visible swelling of your face?     Right jaw/face swelling 5. OTHER SYMPTOMS: Do you have any other symptoms? (e.g., fever)     Reports that sometimes he tastes blood in his mouth 6. PREGNANCY: Is there any chance you are pregnant? When was your last menstrual period?     N/A  Protocols used: Toothache-A-AH

## 2024-03-10 ENCOUNTER — Other Ambulatory Visit: Payer: Self-pay

## 2024-03-11 ENCOUNTER — Other Ambulatory Visit: Payer: Self-pay

## 2024-04-03 ENCOUNTER — Other Ambulatory Visit (HOSPITAL_COMMUNITY): Payer: Self-pay

## 2024-04-07 ENCOUNTER — Other Ambulatory Visit: Payer: Self-pay

## 2024-04-07 ENCOUNTER — Other Ambulatory Visit (HOSPITAL_COMMUNITY): Payer: Self-pay | Admitting: Psychiatry

## 2024-04-07 MED ORDER — GABAPENTIN 300 MG PO CAPS
600.0000 mg | ORAL_CAPSULE | Freq: Every day | ORAL | 0 refills | Status: DC
  Filled 2024-04-07: qty 60, 30d supply, fill #0

## 2024-04-08 ENCOUNTER — Other Ambulatory Visit: Payer: Self-pay

## 2024-04-20 ENCOUNTER — Ambulatory Visit (HOSPITAL_COMMUNITY): Payer: MEDICAID | Admitting: Licensed Clinical Social Worker

## 2024-04-20 DIAGNOSIS — F4323 Adjustment disorder with mixed anxiety and depressed mood: Secondary | ICD-10-CM

## 2024-04-20 DIAGNOSIS — F4310 Post-traumatic stress disorder, unspecified: Secondary | ICD-10-CM

## 2024-04-20 NOTE — Progress Notes (Addendum)
 THERAPIST PROGRESS NOTE  Virtual Visit via Video Note  I connected with [name] on 04/20/24 at  2:00 PM EDT by a video enabled telemedicine application and verified that I am speaking with the correct person using two identifiers.  Location: Patient: [facility]  Provider: Providers Home    I discussed the limitations of evaluation and management by telemedicine and the availability of in person appointments. The patient expressed understanding and agreed to proceed.      I discussed the assessment and treatment plan with the patient. The patient was provided an opportunity to ask questions and all were answered. The patient agreed with the plan and demonstrated an understanding of the instructions.   The patient was advised to call back or seek an in-person evaluation if the symptoms worsen or if the condition fails to improve as anticipated.  I provided 45 minutes of non-face-to-face time during this encounter.   Juliene GORMAN Patee, LCSW   Participation Level: Active  Behavioral Response: CasualAlertAnxious and Depressed  Type of Therapy: Individual Therapy  Treatment Goals addressed:  Active     BH CCP Acute or Chronic Trauma Reaction     LTG: Recall traumatic events without becoming overwhelmed with negative emotions     Start:  10/18/23    Expected End:  05/08/24         STG: Burney will cooperate with and complete psychological testing to help evaluate trauma symptoms (Progressing)     Start:  10/18/23    Expected End:  05/08/24         STG: Verlyn will describe the signs and symptoms of PTSD that are experienced and how they interfere with daily living (Progressing)     Start:  10/18/23    Expected End:  05/08/24         STG: Amaurie will acknowledge that healing from PTSD is a gradual process (Progressing)     Start:  10/18/23    Expected End:  05/08/24         STG: Jameis will identify negative coping strategies that have been used to cope with the  feelings associated with the trauma     Start:  10/18/23    Expected End:  05/08/24         STG: Khup will identify coping strategies to deal with trauma memories and the associated emotional reaction     Start:  10/18/23    Expected End:  05/08/24         STG: Dieter will verbalize an increased sense of mastery over PTSD symptoms by using several techniques to cope with flashbacks, decrease the power of triggers, and decrease negative thinking     Start:  10/18/23    Expected End:  05/08/24         [name]      Start:  10/18/23         Refer Yahshua to a psychiatrist for a consultation regarding medication management of symptoms      Start:  10/18/23         Instruct Baden to communicate effects of prescribed medications (Completed)     Start:  10/18/23    End:  11/28/23      Provide Travell with education on trauma-oriented therapy (Completed)     Start:  10/18/23    End:  11/28/23      Provide and outline the treatment process to  Odie, explaining that it will include a gradual processing of the details and feelings associated with the trauma and developing new, more appropriate coping strategies (Completed)     Start:  10/18/23    End:  11/28/23        OP Depression     LTG: Reduce frequency, intensity, and duration of depression symptoms so that daily functioning is improved (Progressing)     Start:  10/18/23    Expected End:  05/08/24         LTG: Increase coping skills to manage depression and improve ability to perform daily activities (Progressing)     Start:  10/18/23    Expected End:  05/08/24         LTG: Marquez will score less than 5 on the Patient Health Questionnaire (PHQ-9)      Start:  10/18/23    Expected End:  05/08/24         STG: Meng will participate in at least 80% of scheduled individual psychotherapy sessions  (Progressing)     Start:  10/18/23     Expected End:  05/08/24         STG: Aydan will complete at least 80% of assigned homework  (Progressing)     Start:  10/18/23    Expected End:  05/08/24         STG: Gildardo will reduce frequency of avoidant behaviors by 50% as evidenced by self-report in therapy sessions     Start:  10/18/23    Expected End:  05/08/24            ProgressTowards Goals: Progressing  Interventions: CBT, Motivational Interviewing, Solution Focused, and Supportive  Summary: [name] is a 48 y.o. male who presented alert and oriented x 5.  He was pleasant, cooperative, maintained good eye contact.  Patient engaged well in therapy session which was casually.  Patient presented with depressed and anxious mood\affect. S: Subjective Kaynen reports that his primary stressor is family conflict. He describes multiple strained or tense relationships, including with his brother, father, children, and the mothers of his children.  Ezequiel expresses beliefs that false information ("lies") has been spread about him, which he attributes to his father, whom he describes as manipulative. He states that he is unsure what his father tells others about him but is confident that it is "nothing good." Matix reports that others often perceive his father as teacher, english as a foreign language, while he views him as manipulative and a haematologist.  He endorses symptoms of tension, worry, worthlessness, and hopelessness.  Rain denies suicidal ideation, homicidal ideation, and auditory or visual hallucinations.  O: Objective Patient appeared alert and oriented 5. He was cooperative, maintained appropriate eye contact, and was engaged in the session. Mood and affect appeared anxious and dysphoric. Thought content was notable for interpersonal mistrust but no overt delusions or hallucinations were observed or reported.  A: Assessment Phu presents with anxiety and depressive symptoms in the context of ongoing family and interpersonal conflict. Thought content  suggests possible mistrust and preoccupation with perceived negative actions of others, particularly his father. No current risk of harm to self or others identified. Insight and reality testing appear generally intact, though influenced by emotional distress related to strained family dynamics.  Protective factors include treatment engagement, willingness to discuss concerns, and denial of suicidal or homicidal ideation.  P: Plan / Interventions  LCSW utilized supportive therapy to provide validation and empathy regarding family stressors.  Explored interpersonal patterns contributing to relational  conflict.  Encouraged use of coping and grounding techniques to manage anxiety and tension.  Provided psychoeducation on cognitive distortions and emotional regulation strategies.  Plan to continue individual therapy focusing on boundary-setting, emotional regulation, and improving communication within family relationships.  Will continue to monitor mood and thought content for any changes in risk or psychotic symptoms.     Suicidal/Homicidal: Nowithout intent/plan   Plan: Return again in 4 weeks.  Diagnosis: Adjustment disorder with mixed anxiety and depressed mood  PTSD (post-traumatic stress disorder)  Collaboration of Care: Other continued individual therapy at [facility].  Patient/Guardian was advised Release of Information must be obtained prior to any record release in order to collaborate their care with an outside provider. Patient/Guardian was advised if they have not already done so to contact the registration department to sign all necessary forms in order for us  to release information regarding their care.   Consent: Patient/Guardian gives verbal consent for treatment and assignment of benefits for services provided during this visit. Patient/Guardian expressed understanding and agreed to proceed.   Juliene GORMAN Patee, LCSW 04/20/2024

## 2024-04-22 ENCOUNTER — Telehealth (HOSPITAL_COMMUNITY): Payer: MEDICAID | Admitting: Psychiatry

## 2024-04-27 ENCOUNTER — Other Ambulatory Visit: Payer: Self-pay
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
## 2024-04-30 ENCOUNTER — Other Ambulatory Visit: Payer: Self-pay

## 2024-04-30 ENCOUNTER — Other Ambulatory Visit (HOSPITAL_COMMUNITY): Payer: Self-pay

## 2024-05-04 ENCOUNTER — Telehealth (INDEPENDENT_AMBULATORY_CARE_PROVIDER_SITE_OTHER): Payer: MEDICAID | Admitting: Psychiatry

## 2024-05-04 ENCOUNTER — Encounter (HOSPITAL_COMMUNITY): Payer: Self-pay | Admitting: Psychiatry

## 2024-05-04 ENCOUNTER — Other Ambulatory Visit: Payer: Self-pay

## 2024-05-04 DIAGNOSIS — F331 Major depressive disorder, recurrent, moderate: Secondary | ICD-10-CM

## 2024-05-04 DIAGNOSIS — L568 Other specified acute skin changes due to ultraviolet radiation: Secondary | ICD-10-CM

## 2024-05-04 DIAGNOSIS — F4310 Post-traumatic stress disorder, unspecified: Secondary | ICD-10-CM

## 2024-05-04 MED ORDER — GABAPENTIN 300 MG PO CAPS
600.0000 mg | ORAL_CAPSULE | Freq: Every evening | ORAL | 2 refills | Status: DC | PRN
  Filled 2024-05-04 – 2024-05-19 (×2): qty 60, 30d supply, fill #0

## 2024-05-04 MED ORDER — HYDROXYZINE HCL 25 MG PO TABS
25.0000 mg | ORAL_TABLET | Freq: Two times a day (BID) | ORAL | 2 refills | Status: AC | PRN
  Filled 2024-05-04: qty 60, 30d supply, fill #0

## 2024-05-04 MED ORDER — MIRTAZAPINE 7.5 MG PO TABS
7.5000 mg | ORAL_TABLET | Freq: Every day | ORAL | 2 refills | Status: DC
  Filled 2024-05-04: qty 30, 30d supply, fill #0

## 2024-05-04 NOTE — Patient Instructions (Signed)
 Thank you for attending your appointment today.  -- START Remeron 7.5 mg nightly -- Continue other medications as prescribed.  Please do not make any changes to medications without first discussing with your provider. If you are experiencing a psychiatric emergency, please call 911 or present to your nearest emergency department. Additional crisis, medication management, and therapy resources are included below.  [facility]  [address] [place], [place] [address] [phone] WALK-IN URGENT CARE 24/7 FOR ANYONE [address] [place], [place]  [phone] Fax: [phone] [email or web address] *Interpreters available *Accepts all insurance and uninsured for Urgent Care needs *Accepts Medicaid and uninsured for outpatient treatment (below)      ONLY FOR [facility]  Below:    Outpatient New Patient Assessment/Therapy Walk-ins:        Monday, Wednesday, and Thursday 8am until slots are full (first come, first served)                   New Patient Psychiatry/Medication Management        Monday-Friday 8am-11am (first come, first served)               For all walk-ins we ask that you arrive by 7:15am, because patients will be seen in the order of arrival.

## 2024-05-05 ENCOUNTER — Other Ambulatory Visit: Payer: Self-pay

## 2024-05-05 ENCOUNTER — Other Ambulatory Visit (HOSPITAL_COMMUNITY): Payer: Self-pay

## 2024-05-06 ENCOUNTER — Other Ambulatory Visit: Payer: Self-pay

## 2024-05-11 ENCOUNTER — Telehealth: Payer: Self-pay

## 2024-05-11 NOTE — Telephone Encounter (Signed)
 Patient needs to know what his unit/carb ratio is for his omnipod

## 2024-05-12 NOTE — Telephone Encounter (Signed)
 Spoke with patient and informed him of carb ration 1-13

## 2024-05-14 ENCOUNTER — Ambulatory Visit (INDEPENDENT_AMBULATORY_CARE_PROVIDER_SITE_OTHER): Payer: MEDICAID | Admitting: Licensed Clinical Social Worker

## 2024-05-14 DIAGNOSIS — F4310 Post-traumatic stress disorder, unspecified: Secondary | ICD-10-CM | POA: Diagnosis not present

## 2024-05-14 DIAGNOSIS — F331 Major depressive disorder, recurrent, moderate: Secondary | ICD-10-CM | POA: Diagnosis not present

## 2024-05-14 NOTE — Progress Notes (Signed)
 THERAPIST PROGRESS NOTE  Virtual Visit via Video Note  I connected with [name] on 05/14/24 at  2:00 PM EST by a video enabled telemedicine application and verified that I am speaking with the correct person using two identifiers.  Location: Patient: [name]  Provider: Providers Home    I discussed the limitations of evaluation and management by telemedicine and the availability of in person appointments. The patient expressed understanding and agreed to proceed.     I discussed the assessment and treatment plan with the patient. The patient was provided an opportunity to ask questions and all were answered. The patient agreed with the plan and demonstrated an understanding of the instructions.   The patient was advised to call back or seek an in-person evaluation if the symptoms worsen or if the condition fails to improve as anticipated.  I provided 40 minutes of non-face-to-face time during this encounter.   Juliene GORMAN Patee, LCSW   Participation Level: Active  Behavioral Response: CasualAlertAnxious and Depressed  Type of Therapy: Individual Therapy  Treatment Goals addressed:  Active     BH CCP Acute or Chronic Trauma Reaction     LTG: Recall traumatic events without becoming overwhelmed with negative emotions (Progressing)     Start:  10/18/23    Expected End:  10/09/24         STG: [name] will cooperate with and complete psychological testing to help evaluate trauma symptoms (Progressing)     Start:  10/18/23    Expected End:  10/09/24         STG: [name] will describe the signs and symptoms of PTSD that are experienced and how they interfere with daily living (Progressing)     Start:  10/18/23    Expected End:  10/09/24         STG: [name] will acknowledge that healing from PTSD is a gradual process (Progressing)     Start:  10/18/23    Expected End:  10/09/24         STG: [name] will identify negative coping strategies that have been used to  cope with the feelings associated with the trauma     Start:  10/18/23    Expected End:  10/09/24         STG: [name] will identify coping strategies to deal with trauma memories and the associated emotional reaction     Start:  10/18/23    Expected End:  10/09/24         STG: [name] will verbalize an increased sense of mastery over PTSD symptoms by using several techniques to cope with flashbacks, decrease the power of triggers, and decrease negative thinking     Start:  10/18/23    Expected End:  10/09/24         [name]      Start:  10/18/23         Refer [name] to a psychiatrist for a consultation regarding medication management of symptoms      Start:  10/18/23         Instruct [name] to communicate effects of prescribed medications (Completed)     Start:  10/18/23    End:  11/28/23      Provide [name] with education on trauma-oriented therapy (Completed)     Start:  10/18/23    End:  11/28/23      Provide and outline the treatment process to  [name], explaining that it will include a gradual processing of the details and feelings associated with the trauma and developing new, more appropriate coping strategies (Completed)     Start:  10/18/23    End:  11/28/23        OP Depression     LTG: Reduce frequency, intensity, and duration of depression symptoms so that daily functioning is improved (Progressing)     Start:  10/18/23    Expected End:  10/09/24         LTG: Increase coping skills to manage depression and improve ability to perform daily activities (Progressing)     Start:  10/18/23    Expected End:  10/09/24         LTG: [name] will score less than 5 on the Patient Health Questionnaire (PHQ-9)      Start:  10/18/23    Expected End:  10/09/24         STG: [name] will participate in at least 80% of scheduled individual psychotherapy sessions  (Progressing)      Start:  10/18/23    Expected End:  10/09/24         STG: [name] will complete at least 80% of assigned homework  (Progressing)     Start:  10/18/23    Expected End:  10/09/24         STG: [name] will reduce frequency of avoidant behaviors by 50% as evidenced by self-report in therapy sessions     Start:  10/18/23    Expected End:  10/09/24            ProgressTowards Goals: Progressing  Interventions: CBT and Motivational Interviewing   Suicidal/Homicidal: Nowithout intent/plan  Therapist Response:     S: Subjective [name] reports that he is doing "better" overall and notes improvement in depressive and anxiety symptoms since his last session. He attributes this progress to his consistent use of coping skills, particularly focusing on what he can and cannot control. [name] states that he has been doing a better job of letting go of past issues and remaining present-focused.  He reports utilizing coping strategies such as writing down thoughts and experiences to review later when he feels anxious or depressed. [name] confirms that he is taking his medications as prescribed.  His primary current stressor involves financial concerns and the process of applying for Social Security Disability. He is awaiting notification regarding his court hearing for determination and reports advocating for himself by maintaining communication with his lawyers and providing necessary documentation.  [name] denies suicidal or homicidal ideation and denies auditory or visual hallucinations.  O: Objective Patient was alert and oriented 5. He was pleasant, cooperative, and maintained good eye contact throughout the session. [name] was casually dressed and engaged well in therapy. Mood and affect appeared brighter and less anxious than in previous sessions.  A: Assessment [name] demonstrates improvement in mood and anxiety management, indicating increased insight and use of adaptive coping mechanisms. Continued  financial stress remains a concern but does not appear to be contributing to significant mood destabilization at this time. Medication adherence and active engagement in therapy are positive indicators of progress. No current safety risks identified.  Protective factors include insight, medication compliance, engagement in therapy, and self-advocacy efforts related to his financial situation.  P: Plan / Interventions  LCSW validated patient's progress and reinforced the effectiveness of coping skills being utilized.  Provided psychoeducation on the continued importance of medication adherence.  Utilized psychodynamic therapy to encourage expression  of underlying thoughts, emotions, and experiences in a nonjudgmental environment.  Discussed LCSW's upcoming departure on December 14 and informed patient of one remaining follow-up session prior to transfer of care to a new therapist at [facility].  Encouraged continued use of journaling and cognitive reframing techniques to maintain progress.  Plan to continue supportive and insight-oriented therapy in next session, with focus on transition planning and ongoing self-advocacy.  Plan: Return again in 4 weeks.  Diagnosis: PTSD (post-traumatic stress disorder)  MDD (major depressive disorder), recurrent episode, moderate (HCC)  Collaboration of Care: Other    Patient/Guardian was advised Release of Information must be obtained prior to any record release in order to collaborate their care with an outside provider. Patient/Guardian was advised if they have not already done so to contact the registration department to sign all necessary forms in order for us  to release information regarding their care.   Consent: Patient/Guardian gives verbal consent for treatment and assignment of benefits for services provided during this visit. Patient/Guardian expressed understanding and agreed to proceed.   Juliene GORMAN Patee,  LCSW 05/14/2024

## 2024-05-19 ENCOUNTER — Other Ambulatory Visit: Payer: Self-pay

## 2024-05-20 ENCOUNTER — Other Ambulatory Visit: Payer: Self-pay

## 2024-05-22 ENCOUNTER — Observation Stay (HOSPITAL_COMMUNITY)
Admission: EM | Admit: 2024-05-22 | Discharge: 2024-05-25 | Disposition: A | Discharge status: Home / Self Care | Payer: MEDICAID | Attending: Internal Medicine | Admitting: Internal Medicine

## 2024-05-22 ENCOUNTER — Emergency Department (HOSPITAL_COMMUNITY): Payer: MEDICAID

## 2024-05-22 ENCOUNTER — Encounter (HOSPITAL_COMMUNITY): Payer: Self-pay | Admitting: *Deleted

## 2024-05-22 ENCOUNTER — Other Ambulatory Visit: Payer: Self-pay

## 2024-05-22 DIAGNOSIS — N1832 Chronic kidney disease, stage 3b: Secondary | ICD-10-CM | POA: Insufficient documentation

## 2024-05-22 DIAGNOSIS — E8770 Fluid overload, unspecified: Principal | ICD-10-CM | POA: Diagnosis present

## 2024-05-22 DIAGNOSIS — I10 Essential (primary) hypertension: Secondary | ICD-10-CM

## 2024-05-22 DIAGNOSIS — I509 Heart failure, unspecified: Secondary | ICD-10-CM | POA: Diagnosis not present

## 2024-05-22 DIAGNOSIS — R569 Unspecified convulsions: Secondary | ICD-10-CM | POA: Diagnosis not present

## 2024-05-22 DIAGNOSIS — Z87891 Personal history of nicotine dependence: Secondary | ICD-10-CM | POA: Insufficient documentation

## 2024-05-22 DIAGNOSIS — Z794 Long term (current) use of insulin: Secondary | ICD-10-CM | POA: Diagnosis not present

## 2024-05-22 DIAGNOSIS — E1122 Type 2 diabetes mellitus with diabetic chronic kidney disease: Secondary | ICD-10-CM | POA: Diagnosis not present

## 2024-05-22 DIAGNOSIS — Z79899 Other long term (current) drug therapy: Secondary | ICD-10-CM | POA: Insufficient documentation

## 2024-05-22 DIAGNOSIS — I130 Hypertensive heart and chronic kidney disease with heart failure and stage 1 through stage 4 chronic kidney disease, or unspecified chronic kidney disease: Secondary | ICD-10-CM | POA: Diagnosis not present

## 2024-05-22 DIAGNOSIS — F32A Depression, unspecified: Secondary | ICD-10-CM | POA: Diagnosis not present

## 2024-05-22 DIAGNOSIS — I160 Hypertensive urgency: Secondary | ICD-10-CM | POA: Insufficient documentation

## 2024-05-22 DIAGNOSIS — E785 Hyperlipidemia, unspecified: Secondary | ICD-10-CM | POA: Insufficient documentation

## 2024-05-22 DIAGNOSIS — E1022 Type 1 diabetes mellitus with diabetic chronic kidney disease: Secondary | ICD-10-CM

## 2024-05-22 DIAGNOSIS — E1010 Type 1 diabetes mellitus with ketoacidosis without coma: Secondary | ICD-10-CM

## 2024-05-22 LAB — CBC WITH DIFFERENTIAL/PLATELET
Abs Immature Granulocytes: 0 K/uL (ref 0.00–0.07)
Basophils Absolute: 0.1 K/uL (ref 0.0–0.1)
Basophils Relative: 1 %
Eosinophils Absolute: 0.2 K/uL (ref 0.0–0.5)
Eosinophils Relative: 5 %
HCT: 38.3 % — ABNORMAL LOW (ref 39.0–52.0)
Hemoglobin: 12.7 g/dL — ABNORMAL LOW (ref 13.0–17.0)
Immature Granulocytes: 0 %
Lymphocytes Relative: 31 %
Lymphs Abs: 1.6 K/uL (ref 0.7–4.0)
MCH: 28.4 pg (ref 26.0–34.0)
MCHC: 33.2 g/dL (ref 30.0–36.0)
MCV: 85.7 fL (ref 80.0–100.0)
Monocytes Absolute: 0.4 K/uL (ref 0.1–1.0)
Monocytes Relative: 8 %
Neutro Abs: 3 K/uL (ref 1.7–7.7)
Neutrophils Relative %: 55 %
Platelets: 219 K/uL (ref 150–400)
RBC: 4.47 MIL/uL (ref 4.22–5.81)
RDW: 13.2 % (ref 11.5–15.5)
WBC: 5.3 K/uL (ref 4.0–10.5)
nRBC: 0 % (ref 0.0–0.2)

## 2024-05-22 LAB — COMPREHENSIVE METABOLIC PANEL WITH GFR
ALT: 27 U/L (ref 0–44)
AST: 32 U/L (ref 15–41)
Albumin: 3.2 g/dL — ABNORMAL LOW (ref 3.5–5.0)
Alkaline Phosphatase: 47 U/L (ref 38–126)
Anion gap: 8 (ref 5–15)
BUN: 25 mg/dL — ABNORMAL HIGH (ref 6–20)
CO2: 20 mmol/L — ABNORMAL LOW (ref 22–32)
Calcium: 9 mg/dL (ref 8.9–10.3)
Chloride: 110 mmol/L (ref 98–111)
Creatinine, Ser: 1.96 mg/dL — ABNORMAL HIGH (ref 0.61–1.24)
GFR, Estimated: 42 mL/min — ABNORMAL LOW (ref >60)
Glucose, Bld: 127 mg/dL — ABNORMAL HIGH (ref 70–99)
Potassium: 4.7 mmol/L (ref 3.5–5.1)
Sodium: 138 mmol/L (ref 135–145)
Total Bilirubin: 0.8 mg/dL (ref 0.0–1.2)
Total Protein: 6 g/dL — ABNORMAL LOW (ref 6.5–8.1)

## 2024-05-22 LAB — CBG MONITORING, ED: Glucose-Capillary: 123 mg/dL — ABNORMAL HIGH (ref 70–99)

## 2024-05-22 LAB — BRAIN NATRIURETIC PEPTIDE: B Natriuretic Peptide: 246 pg/mL — ABNORMAL HIGH (ref 0.0–100.0)

## 2024-05-22 NOTE — ED Provider Triage Note (Signed)
 Emergency Medicine Provider Triage Evaluation Note  Neil LOISE Gavel , a 48 y.o. male  was evaluated in triage.  Pt complains of swelling. Woke up this AM notiticing his body has swollen up which is new.  Felt tightness everywhere.  No sob, no lighthead or dizzy.  Was started on a new med, Mirtazapine a week ago  Review of Systems  Positive: As above Negative: As above  Physical Exam  BP (!) 187/84   Pulse 64   Temp 98.3 F (36.8 C) (Oral)   Resp 18   Ht 5' 11 (1.803 m)   Wt 89.4 kg   SpO2 100%   BMI 27.49 kg/m  Gen:   Awake, no distress   Resp:  Normal effort  MSK:   Moves extremities without difficulty  Other:    Medical Decision Making  Medically screening exam initiated at 4:33 PM.  Appropriate orders placed.  Laray N Picardi was informed that the remainder of the evaluation will be completed by another provider, this initial triage assessment does not replace that evaluation, and the importance of remaining in the ED until their evaluation is complete.     Nivia Colon, PA-C 05/22/24 [phone]

## 2024-05-22 NOTE — ED Provider Notes (Signed)
 [facility] AT [facility] Provider Note   CSN: [id number] Arrival date & time: 05/22/24  1547     Patient presents with: No chief complaint on file.   [name] is a 48 y.o. male.   The history is provided by the patient and medical records.   [name] is a 48 y.o. male who presents to the Emergency Department complaining of swelling.  He presents to the emergency department for evaluation of swelling throughout his face, arms and legs that started this morning.  He also reports intermittent sternal cramping earlier today, last episode was at 6 PM.  No associated shortness of breath, cough, fever, nausea, vomiting.  He does report poor appetite over the last 2 months and was started on Remeron for this.  He also reports increased urination.  He has a history of diabetes, CKD, hypertension.  He is compliant with his home medications and he did take his blood pressure medication this morning.  Prior to Admission medications   Medication Sig Start Date End Date Taking? Authorizing Provider  Accu-Chek Softclix Lancets lancets Use to check blood sugar up to 6 times daily. 11/06/22   Celestia Rosaline SQUIBB, NP  Blood Glucose Monitoring Suppl (ACCU-CHEK GUIDE) w/Device KIT Use to check blood sugar up to 6 times daily. 11/06/22   Celestia Rosaline SQUIBB, NP  Blood Pressure Monitoring ([employer] ADVANCED BP MONITOR) DEVI 1 Device by Does not apply route daily. 03/20/22   Gladis Elsie BROCKS, PA-C  cloNIDine  (CATAPRES ) 0.2 MG tablet Take 1 tablet (0.2 mg total) by mouth daily. 12/09/22   Celestia Rosaline SQUIBB, NP  Continuous Glucose Sensor (DEXCOM G7 SENSOR) MISC Change sensors every 10 days 10/08/23   Shamleffer, Ibtehal Jaralla, MD  ergocalciferol  (VITAMIN D2) 1.25 MG (50000 UT) capsule Take 1 capsule (50,000 Units total) by mouth once a week. 01/16/24   Celestia Rosaline SQUIBB, NP  gabapentin  (NEURONTIN ) 300 MG capsule Take 2 capsules (600 mg total) by mouth at bedtime as needed.  05/04/24   Bahraini, Sarah A  GLOBAL INJECT EASE INSULIN  SYR 30G X 1/2 1 ML MISC USE WITH insulin  DAILY in THE afternoon 01/18/23   [provider]  glucose blood (ACCU-CHEK GUIDE) test strip USE TO test blood sugar six times EVERY DAY AS DIRECTED 02/07/23   Newlin, Enobong, MD  hydrOXYzine  (ATARAX ) 25 MG tablet Take 1 tablet (25 mg total) by mouth 2 (two) times daily as needed for anxiety. 05/04/24   Bahraini, Sarah A  insulin  aspart (NOVOLOG ) 100 UNIT/ML injection Max daily dose of 50 units into the skin daily 10/08/23   Shamleffer, Donell Cardinal, MD  Insulin  Disposable Pump (OMNIPOD 5 DEXG7G6 PODS GEN 5) MISC Apply one device every other day. 09/25/23   Shamleffer, Ibtehal Jaralla, MD  Insulin  Disposable Pump (OMNIPOD 5 G7 PODS, GEN 5,) MISC 1 Device by Does not apply route every other day. 09/25/23   Shamleffer, Ibtehal Jaralla, MD  lisinopril -hydrochlorothiazide  (ZESTORETIC ) 20-25 MG tablet Take 1 tablet by mouth daily. 12/12/23   Celestia Rosaline SQUIBB, NP  mirtazapine (REMERON) 7.5 MG tablet Take 1 tablet (7.5 mg total) by mouth at bedtime. 05/04/24   Bahraini, Sarah A  rosuvastatin  (CRESTOR ) 20 MG tablet Take 1 tablet (20 mg total) by mouth daily. 01/16/24   Celestia Rosaline SQUIBB, NP    Allergies: Geodon [ziprasidone hcl] and Amlodipine     Review of Systems  All other systems reviewed and are negative.   Updated Vital Signs BP (!) 176/85  Pulse (!) 54   Temp 98.3 F (36.8 C)   Resp 12   Ht 5' 11 (1.803 m)   Wt 89.4 kg   SpO2 100%   BMI 27.49 kg/m   Physical Exam Vitals and nursing note reviewed.  Constitutional:      Appearance: He is well-developed.  HENT:     Head: Normocephalic and atraumatic.  Cardiovascular:     Rate and Rhythm: Normal rate and regular rhythm.     Heart sounds: No murmur heard. Pulmonary:     Effort: Pulmonary effort is normal. No respiratory distress.     Breath sounds: Normal breath sounds.  Abdominal:     Palpations: Abdomen is soft.      Tenderness: There is no abdominal tenderness. There is no guarding or rebound.  Musculoskeletal:        General: No tenderness.     Comments: Pitting edema to BLE.  Nonpitting edema to BUE  Skin:    General: Skin is warm and dry.  Neurological:     Mental Status: He is alert and oriented to person, place, and time.  Psychiatric:        Behavior: Behavior normal.     (all labs ordered are listed, but only abnormal results are displayed) Labs Reviewed  BRAIN NATRIURETIC PEPTIDE - Abnormal; Notable for the following components:      Result Value   B Natriuretic Peptide 246.0 (*)    All other components within normal limits  CBC WITH DIFFERENTIAL/PLATELET - Abnormal; Notable for the following components:   Hemoglobin 12.7 (*)    HCT 38.3 (*)    All other components within normal limits  COMPREHENSIVE METABOLIC PANEL WITH GFR - Abnormal; Notable for the following components:   CO2 20 (*)    Glucose, Bld 127 (*)    BUN 25 (*)    Creatinine, Ser 1.96 (*)    Total Protein 6.0 (*)    Albumin 3.2 (*)    GFR, Estimated 42 (*)    All other components within normal limits  URINALYSIS, ROUTINE W REFLEX MICROSCOPIC - Abnormal; Notable for the following components:   Glucose, UA 50 (*)    Hgb urine dipstick SMALL (*)    Protein, ur >=300 (*)    All other components within normal limits  CBC - Abnormal; Notable for the following components:   Hemoglobin 12.5 (*)    HCT 37.9 (*)    nRBC 0.4 (*)    All other components within normal limits  CREATININE, SERUM - Abnormal; Notable for the following components:   Creatinine, Ser 1.84 (*)    GFR, Estimated 45 (*)    All other components within normal limits  CBG MONITORING, ED - Abnormal; Notable for the following components:   Glucose-Capillary 123 (*)    All other components within normal limits  CBG MONITORING, ED - Abnormal; Notable for the following components:   Glucose-Capillary 61 (*)    All other components within normal limits   CBG MONITORING, ED - Abnormal; Notable for the following components:   Glucose-Capillary 194 (*)    All other components within normal limits  CBG MONITORING, ED - Abnormal; Notable for the following components:   Glucose-Capillary 110 (*)    All other components within normal limits  D-DIMER, QUANTITATIVE (NOT AT [facility])  COMPREHENSIVE METABOLIC PANEL WITH GFR  CBC  HIV ANTIBODY (ROUTINE TESTING W REFLEX)  CBG MONITORING, ED  TROPONIN I (HIGH SENSITIVITY)  TROPONIN I (HIGH SENSITIVITY)  EKG: EKG Interpretation Date/Time:  Saturday May 23 2024 00:03:47 EST Ventricular Rate:  58 PR Interval:  172 QRS Duration:  89 QT Interval:  423 QTC Calculation: 416 R Axis:   -5  Text Interpretation: Sinus rhythm Probable left atrial enlargement Abnormal R-wave progression, early transition Left ventricular hypertrophy Minimal ST elevation, inferior leads Confirmed by Griselda Norris [phone]) on 05/23/2024 12:27:13 AM  Radiology: CT CHEST WO CONTRAST Result Date: 05/23/2024 EXAM: CT CHEST WITHOUT CONTRAST 05/23/2024 02:10:30 AM TECHNIQUE: CT of the chest was performed without the administration of intravenous contrast. Multiplanar reformatted images are provided for review. Automated exposure control, iterative reconstruction, and/or weight based adjustment of the mA/kV was utilized to reduce the radiation dose to as low as reasonably achievable. COMPARISON: None available. CLINICAL HISTORY: Chest pain, nonspecific FINDINGS: MEDIASTINUM: Heart and pericardium are unremarkable. The central airways are clear. LYMPH NODES: No mediastinal, hilar or axillary lymphadenopathy. LUNGS AND PLEURA: No focal consolidation or pulmonary edema. No pleural effusion or pneumothorax. SOFT TISSUES/BONES: No acute abnormality of the bones or soft tissues. UPPER ABDOMEN: Limited images of the upper abdomen demonstrates no acute abnormality. IMPRESSION: 1. Negative CT chest. Electronically signed by: Pinkie Pebbles  MD 05/23/2024 02:20 AM EST RP Workstation: [id number]   DG Chest 2 View Result Date: 05/22/2024 EXAM: 2 VIEW(S) XRAY OF THE CHEST 05/22/2024 05:16:00 PM COMPARISON: Comparison with 06/04/2022. CLINICAL HISTORY: edema edema FINDINGS: LUNGS AND PLEURA: No focal pulmonary opacity. No pleural effusion. No pneumothorax. HEART AND MEDIASTINUM: No acute abnormality of the cardiac and mediastinal silhouettes. BONES AND SOFT TISSUES: No acute osseous abnormality. IMPRESSION: 1. No acute cardiopulmonary process. Electronically signed by: Elsie Gravely MD 05/22/2024 05:24 PM EST RP Workstation: [id number]     Procedures   Medications Ordered in the ED  dextrose  (GLUTOSE) oral gel 40% (peds > 20kg and adults) (0 g Oral Hold 05/23/24 0202)  rosuvastatin  (CRESTOR ) tablet 20 mg (has no administration in time range)  hydrALAZINE  (APRESOLINE ) tablet 50 mg (50 mg Oral Given 05/23/24 0429)  insulin  aspart (novoLOG ) injection 0-6 Units (has no administration in time range)  heparin injection 5,000 Units (5,000 Units Subcutaneous Patient Refused/Not Given 05/23/24 0535)  ondansetron  (ZOFRAN ) tablet 4 mg (has no administration in time range)    Or  ondansetron  (ZOFRAN ) injection 4 mg (has no administration in time range)  acetaminophen  (TYLENOL ) tablet 650 mg (has no administration in time range)    Or  acetaminophen  (TYLENOL ) suppository 650 mg (has no administration in time range)  albuterol  (PROVENTIL ) (2.5 MG/3ML) 0.083% nebulizer solution 2.5 mg (has no administration in time range)  hydrALAZINE  (APRESOLINE ) injection 10 mg (has no administration in time range)  hydrALAZINE  (APRESOLINE ) injection 5 mg (5 mg Intravenous Given 05/23/24 0052)  furosemide (LASIX) injection 20 mg (20 mg Intravenous Given 05/23/24 0158)  LORazepam  (ATIVAN ) tablet 0.5 mg (0.5 mg Oral Given 05/23/24 0310)                                    Medical Decision Making Amount and/or Complexity of Data Reviewed Labs:  ordered.  Risk Prescription drug management. Decision regarding hospitalization.   Patient with history of hypertension, CKD, diabetes here for evaluation of diffuse edema throughout face arms and legs.  He also has intermittent chest pain.  Troponin is negative.  Renal function is stable when compared to priors.  He does have diffuse edema on examination without respiratory distress.  He was treated  with a dose of furosemide.  He was also found to be very hypertensive, treated with one-time dose of hydralazine .  He did take his morning medication.  BNP is elevated.  Given new elevation BNP, significantly elevated blood pressures as well as new onset edema medicine consulted for observation admission.     Final diagnoses:  Acute congestive heart failure, unspecified heart failure type [facility])  Accelerated hypertension    ED Discharge Orders     None          Griselda Norris, MD 05/23/24 [phone]

## 2024-05-22 NOTE — ED Notes (Signed)
 The pts last cbg was159

## 2024-05-23 ENCOUNTER — Inpatient Hospital Stay (HOSPITAL_COMMUNITY): Payer: MEDICAID

## 2024-05-23 ENCOUNTER — Emergency Department (HOSPITAL_COMMUNITY): Payer: MEDICAID

## 2024-05-23 DIAGNOSIS — I10 Essential (primary) hypertension: Secondary | ICD-10-CM

## 2024-05-23 DIAGNOSIS — E8770 Fluid overload, unspecified: Secondary | ICD-10-CM | POA: Diagnosis present

## 2024-05-23 DIAGNOSIS — I160 Hypertensive urgency: Secondary | ICD-10-CM | POA: Diagnosis not present

## 2024-05-23 LAB — URINALYSIS, ROUTINE W REFLEX MICROSCOPIC
Bacteria, UA: NONE SEEN
Bilirubin Urine: NEGATIVE
Glucose, UA: 50 mg/dL — AB
Ketones, ur: NEGATIVE mg/dL
Leukocytes,Ua: NEGATIVE
Nitrite: NEGATIVE
Protein, ur: >=300 mg/dL — AB
Specific Gravity, Urine: 1.009 (ref 1.005–1.030)
pH: 5 (ref 5.0–8.0)

## 2024-05-23 LAB — COMPREHENSIVE METABOLIC PANEL WITH GFR
ALT: 26 U/L (ref 0–44)
AST: 32 U/L (ref 15–41)
Albumin: 2.9 g/dL — ABNORMAL LOW (ref 3.5–5.0)
Alkaline Phosphatase: 51 U/L (ref 38–126)
Anion gap: 10 (ref 5–15)
BUN: 24 mg/dL — ABNORMAL HIGH (ref 6–20)
CO2: 20 mmol/L — ABNORMAL LOW (ref 22–32)
Calcium: 9.1 mg/dL (ref 8.9–10.3)
Chloride: 107 mmol/L (ref 98–111)
Creatinine, Ser: 2.1 mg/dL — ABNORMAL HIGH (ref 0.61–1.24)
GFR, Estimated: 39 mL/min — ABNORMAL LOW (ref >60)
Glucose, Bld: 84 mg/dL (ref 70–99)
Potassium: 5 mmol/L (ref 3.5–5.1)
Sodium: 137 mmol/L (ref 135–145)
Total Bilirubin: 1 mg/dL (ref 0.0–1.2)
Total Protein: 5.8 g/dL — ABNORMAL LOW (ref 6.5–8.1)

## 2024-05-23 LAB — CBG MONITORING, ED
Glucose-Capillary: 102 mg/dL — ABNORMAL HIGH (ref 70–99)
Glucose-Capillary: 110 mg/dL — ABNORMAL HIGH (ref 70–99)
Glucose-Capillary: 194 mg/dL — ABNORMAL HIGH (ref 70–99)
Glucose-Capillary: 274 mg/dL — ABNORMAL HIGH (ref 70–99)
Glucose-Capillary: 285 mg/dL — ABNORMAL HIGH (ref 70–99)
Glucose-Capillary: 61 mg/dL — ABNORMAL LOW (ref 70–99)
Glucose-Capillary: 79 mg/dL (ref 70–99)

## 2024-05-23 LAB — CBC
HCT: 37.9 % — ABNORMAL LOW (ref 39.0–52.0)
HCT: 39.2 % (ref 39.0–52.0)
Hemoglobin: 12.5 g/dL — ABNORMAL LOW (ref 13.0–17.0)
Hemoglobin: 13.2 g/dL (ref 13.0–17.0)
MCH: 27.7 pg (ref 26.0–34.0)
MCH: 28.3 pg (ref 26.0–34.0)
MCHC: 33 g/dL (ref 30.0–36.0)
MCHC: 33.7 g/dL (ref 30.0–36.0)
MCV: 84 fL (ref 80.0–100.0)
MCV: 84.1 fL (ref 80.0–100.0)
Platelets: 220 K/uL (ref 150–400)
Platelets: 220 K/uL (ref 150–400)
RBC: 4.51 MIL/uL (ref 4.22–5.81)
RBC: 4.66 MIL/uL (ref 4.22–5.81)
RDW: 13.2 % (ref 11.5–15.5)
RDW: 13.3 % (ref 11.5–15.5)
WBC: 5.5 K/uL (ref 4.0–10.5)
WBC: 6.8 K/uL (ref 4.0–10.5)
nRBC: 0.4 % — ABNORMAL HIGH (ref 0.0–0.2)
nRBC: 0 % (ref 0.0–0.2)

## 2024-05-23 LAB — GLUCOSE, CAPILLARY
Glucose-Capillary: 117 mg/dL — ABNORMAL HIGH (ref 70–99)
Glucose-Capillary: 174 mg/dL — ABNORMAL HIGH (ref 70–99)

## 2024-05-23 LAB — D-DIMER, QUANTITATIVE: D-Dimer, Quant: 0.36 ug{FEU}/mL (ref 0.00–0.50)

## 2024-05-23 LAB — CREATININE, SERUM
Creatinine, Ser: 1.84 mg/dL — ABNORMAL HIGH (ref 0.61–1.24)
GFR, Estimated: 45 mL/min — ABNORMAL LOW (ref >60)

## 2024-05-23 LAB — TROPONIN I (HIGH SENSITIVITY)
Troponin I (High Sensitivity): 7 ng/L (ref <18)
Troponin I (High Sensitivity): 8 ng/L (ref <18)

## 2024-05-23 MED ORDER — ONDANSETRON HCL 4 MG/2ML IJ SOLN: 4.0000 mg | Freq: Four times a day (QID) | INTRAMUSCULAR | Status: DC | PRN

## 2024-05-23 MED ORDER — HEPARIN SODIUM (PORCINE) 5000 UNIT/ML IJ SOLN
5000.0000 [IU] | Freq: Three times a day (TID) | INTRAMUSCULAR | Status: DC
  Administered 2024-05-23 – 2024-05-25 (×5): 5000 [IU] via SUBCUTANEOUS
  Filled 2024-05-23 (×6): qty 1

## 2024-05-23 MED ORDER — ONDANSETRON HCL 4 MG PO TABS: 4.0000 mg | ORAL_TABLET | Freq: Four times a day (QID) | ORAL | Status: DC | PRN

## 2024-05-23 MED ORDER — CLONIDINE HCL 0.2 MG PO TABS: 0.2000 mg | ORAL_TABLET | Freq: Every day | ORAL | Status: DC

## 2024-05-23 MED ORDER — INSULIN ASPART 100 UNIT/ML IJ SOLN
0.0000 [IU] | Freq: Three times a day (TID) | INTRAMUSCULAR | Status: DC
  Administered 2024-05-23: 3 [IU] via SUBCUTANEOUS
  Administered 2024-05-24: 2 [IU] via SUBCUTANEOUS
  Filled 2024-05-23: qty 2
  Filled 2024-05-23: qty 3

## 2024-05-23 MED ORDER — HYDRALAZINE HCL 20 MG/ML IJ SOLN
10.0000 mg | Freq: Four times a day (QID) | INTRAMUSCULAR | Status: DC | PRN
  Administered 2024-05-24: 10 mg via INTRAVENOUS
  Filled 2024-05-23: qty 1

## 2024-05-23 MED ORDER — LABETALOL HCL 5 MG/ML IV SOLN: 10.0000 mg | INTRAVENOUS | Status: DC | PRN

## 2024-05-23 MED ORDER — HYDROXYZINE HCL 25 MG PO TABS
25.0000 mg | ORAL_TABLET | Freq: Two times a day (BID) | ORAL | Status: DC | PRN
Start: 2024-05-23 — End: 2024-05-25

## 2024-05-23 MED ORDER — ACETAMINOPHEN 325 MG PO TABS: 650.0000 mg | ORAL_TABLET | Freq: Four times a day (QID) | ORAL | Status: DC | PRN

## 2024-05-23 MED ORDER — GLUCOSE 40 % PO GEL
1.0000 | ORAL | Status: AC
  Filled 2024-05-23: qty 1.21

## 2024-05-23 MED ORDER — FUROSEMIDE 10 MG/ML IJ SOLN
20.0000 mg | Freq: Once | INTRAMUSCULAR | Status: AC
  Administered 2024-05-23: 20 mg via INTRAVENOUS
  Filled 2024-05-23: qty 2

## 2024-05-23 MED ORDER — HYDROCHLOROTHIAZIDE 25 MG PO TABS
25.0000 mg | ORAL_TABLET | Freq: Every day | ORAL | Status: DC
  Administered 2024-05-23: 25 mg via ORAL
  Filled 2024-05-23: qty 1

## 2024-05-23 MED ORDER — INSULIN PUMP
Freq: Three times a day (TID) | SUBCUTANEOUS | Status: DC
  Administered 2024-05-25: 5 via SUBCUTANEOUS
  Filled 2024-05-23: qty 1

## 2024-05-23 MED ORDER — HYDRALAZINE HCL 50 MG PO TABS
50.0000 mg | ORAL_TABLET | Freq: Three times a day (TID) | ORAL | Status: DC
  Administered 2024-05-23 – 2024-05-24 (×4): 50 mg via ORAL
  Filled 2024-05-23: qty 2
  Filled 2024-05-23: qty 1
  Filled 2024-05-23: qty 2
  Filled 2024-05-23: qty 1

## 2024-05-23 MED ORDER — HYDRALAZINE HCL 20 MG/ML IJ SOLN
5.0000 mg | Freq: Once | INTRAMUSCULAR | Status: AC
  Administered 2024-05-23: 5 mg via INTRAVENOUS
  Filled 2024-05-23: qty 1

## 2024-05-23 MED ORDER — ALBUTEROL SULFATE (2.5 MG/3ML) 0.083% IN NEBU: 2.5000 mg | INHALATION_SOLUTION | RESPIRATORY_TRACT | Status: DC | PRN

## 2024-05-23 MED ORDER — ROSUVASTATIN CALCIUM 20 MG PO TABS
20.0000 mg | ORAL_TABLET | Freq: Every day | ORAL | Status: DC
  Administered 2024-05-23 – 2024-05-25 (×3): 20 mg via ORAL
  Filled 2024-05-23 (×3): qty 1

## 2024-05-23 MED ORDER — LISINOPRIL 20 MG PO TABS
40.0000 mg | ORAL_TABLET | Freq: Every day | ORAL | Status: DC
  Administered 2024-05-24: 40 mg via ORAL
  Filled 2024-05-23: qty 2

## 2024-05-23 MED ORDER — ACETAMINOPHEN 650 MG RE SUPP: 650.0000 mg | Freq: Four times a day (QID) | RECTAL | Status: DC | PRN

## 2024-05-23 MED ORDER — FUROSEMIDE 10 MG/ML IJ SOLN: 40.0000 mg | Freq: Two times a day (BID) | INTRAMUSCULAR | Status: DC

## 2024-05-23 MED ORDER — LISINOPRIL 20 MG PO TABS
20.0000 mg | ORAL_TABLET | Freq: Once | ORAL | Status: AC
  Administered 2024-05-23: 20 mg via ORAL
  Filled 2024-05-23: qty 1

## 2024-05-23 MED ORDER — LISINOPRIL-HYDROCHLOROTHIAZIDE 20-25 MG PO TABS: 1.0000 | ORAL_TABLET | Freq: Every day | ORAL | Status: DC

## 2024-05-23 MED ORDER — LORAZEPAM 1 MG PO TABS
0.5000 mg | ORAL_TABLET | Freq: Once | ORAL | Status: AC
  Administered 2024-05-23: 0.5 mg via ORAL
  Filled 2024-05-23: qty 1

## 2024-05-23 MED ORDER — LISINOPRIL 20 MG PO TABS
20.0000 mg | ORAL_TABLET | Freq: Every day | ORAL | Status: DC
  Administered 2024-05-23: 20 mg via ORAL
  Filled 2024-05-23: qty 1

## 2024-05-23 NOTE — ED Notes (Signed)
Patient given 8oz of apple juice

## 2024-05-23 NOTE — Progress Notes (Signed)
 Patient admitted after midnight, please see H&P.  Adjusting BP meds.  Follows with Dr. Jerrye naomia Kidney).  Trend Cr.   Harlene Bowl DO

## 2024-05-23 NOTE — H&P (Addendum)
 History and Physical    [name] FMW:[id number] DOB: 01-15-1976 DOA: 05/22/2024  PCP: Celestia Rosaline SQUIBB, NP  Patient coming from: home  I have personally briefly reviewed patient's old medical records in [facility]  Chief Complaint: presents with complaint of whole body swelling  HPI: [name] is a 48 y.o. male with medical history significant of HTN , DMI, HLD,PTSD, Prolonged QT, Seizure d/o ,Depression, who presents to ED with complaint of feeling of  whole body swelling and chest pain x 1 day. Patient notes only new medication is Remeron. He notes on increase sob, no fever/chills /n/v. He also noted intermittent HA, no presyncope no palpitations. He states he has felt off for a few weeks and tried to get in with his pcp but was unable to. He notes that his blood pressure has been uncontrolled at home despite compliance with his medication and diet.    ED Course:  Afeb, bp 187/84, hr 64, rr 18 sat 100%  Wbc 5.3, hgb 12.7,  plt 219,  Na 138, K 4.7, Cl 110, bicarb 20, Gly 127, cr 1.96 (2.08)  BNP 246  Tx :  Hydralazine  - 5mg   cxrMPRESSION: 1. No acute cardiopulmonary process.  EKG:NSR, LAE , LVH Cbg 61 CE 8  Review of Systems: As per HPI otherwise 10 point review of systems negative.   Past Medical History:  Diagnosis Date   Depression    Diabetes mellitus    Diabetes mellitus type I (HCC)    Hypertension    Prolonged QT interval 04/02/2019   PTSD (post-traumatic stress disorder)    Seizures (HCC)     Past Surgical History:  Procedure Laterality Date   HERNIA REPAIR       reports that he quit smoking about 20 months ago. His smoking use included cigarettes. He has never used smokeless tobacco. He reports that he does not currently use alcohol after a past usage of about 1.0 standard drink of alcohol per week. He reports that he does not use drugs.  Allergies  Allergen Reactions   Geodon [Ziprasidone Hcl] Other (See Comments)    Severe muscle  spasms (ENTIRE BODY)- had to call EMS   Amlodipine  Hives and Swelling    Lower extremities    Family History  Problem Relation Age of Onset   Hypertension Mother    Depression Mother    Drug abuse Mother    Diabetes Paternal Uncle     Prior to Admission medications   Medication Sig Start Date End Date Taking? Authorizing Provider  Accu-Chek Softclix Lancets lancets Use to check blood sugar up to 6 times daily. 11/06/22   Celestia Rosaline SQUIBB, NP  Blood Glucose Monitoring Suppl (ACCU-CHEK GUIDE) w/Device KIT Use to check blood sugar up to 6 times daily. 11/06/22   Celestia Rosaline SQUIBB, NP  Blood Pressure Monitoring ([employer] ADVANCED BP MONITOR) DEVI 1 Device by Does not apply route daily. 03/20/22   Gladis Elsie BROCKS, PA-C  cloNIDine  (CATAPRES ) 0.2 MG tablet Take 1 tablet (0.2 mg total) by mouth daily. 12/09/22   Celestia Rosaline SQUIBB, NP  Continuous Glucose Sensor (DEXCOM G7 SENSOR) MISC Change sensors every 10 days 10/08/23   Shamleffer, Ibtehal Jaralla, MD  ergocalciferol  (VITAMIN D2) 1.25 MG (50000 UT) capsule Take 1 capsule (50,000 Units total) by mouth once a week. 01/16/24   Celestia Rosaline SQUIBB, NP  gabapentin  (NEURONTIN ) 300 MG capsule Take 2 capsules (600 mg total) by mouth at bedtime as needed. 05/04/24  Bahraini, Sarah A  GLOBAL INJECT EASE INSULIN  SYR 30G X 1/2 1 ML MISC USE WITH insulin  DAILY in THE afternoon 01/18/23   [provider]  glucose blood (ACCU-CHEK GUIDE) test strip USE TO test blood sugar six times EVERY DAY AS DIRECTED 02/07/23   Newlin, Enobong, MD  hydrOXYzine  (ATARAX ) 25 MG tablet Take 1 tablet (25 mg total) by mouth 2 (two) times daily as needed for anxiety. 05/04/24   Bahraini, Sarah A  insulin  aspart (NOVOLOG ) 100 UNIT/ML injection Max daily dose of 50 units into the skin daily 10/08/23   Shamleffer, Donell Cardinal, MD  Insulin  Disposable Pump (OMNIPOD 5 DEXG7G6 PODS GEN 5) MISC Apply one device every other day. 09/25/23   Shamleffer, Ibtehal Jaralla, MD  Insulin   Disposable Pump (OMNIPOD 5 G7 PODS, GEN 5,) MISC 1 Device by Does not apply route every other day. 09/25/23   Shamleffer, Ibtehal Jaralla, MD  lisinopril -hydrochlorothiazide  (ZESTORETIC ) 20-25 MG tablet Take 1 tablet by mouth daily. 12/12/23   Celestia Rosaline SQUIBB, NP  mirtazapine (REMERON) 7.5 MG tablet Take 1 tablet (7.5 mg total) by mouth at bedtime. 05/04/24   Bahraini, Sarah A  rosuvastatin  (CRESTOR ) 20 MG tablet Take 1 tablet (20 mg total) by mouth daily. 01/16/24   Celestia Rosaline SQUIBB, NP    Physical Exam: Vitals:   05/22/24 1616 05/22/24 1852 05/22/24 2216 05/23/24 0052  BP:  (!) 208/85 (!) 213/91 (!) 204/88  Pulse:  63 60   Resp:  18 16   Temp:  98.1 F (36.7 C) 98.3 F (36.8 C)   TempSrc:      SpO2:  100% 100%   Weight: 89.4 kg     Height: 5' 11 (1.803 m)       Constitutional: NAD, calm, comfortable Vitals:   05/22/24 1616 05/22/24 1852 05/22/24 2216 05/23/24 0052  BP:  (!) 208/85 (!) 213/91 (!) 204/88  Pulse:  63 60   Resp:  18 16   Temp:  98.1 F (36.7 C) 98.3 F (36.8 C)   TempSrc:      SpO2:  100% 100%   Weight: 89.4 kg     Height: 5' 11 (1.803 m)      Eyes: PERRL, lids and conjunctivae normal ENMT: Mucous membranes are moist. Posterior pharynx clear of any exudate or lesions.Normal dentition.  Neck: normal, supple, no masses, no thyromegaly Respiratory: clear to auscultation bilaterally, no wheezing, no crackles. Normal respiratory effort. No accessory muscle use.  Cardiovascular: Regular rate and rhythm, no murmurs / rubs / gallops. No extremity edema. 2+ pedal pulses.  Abdomen: no tenderness, no masses palpated. No hepatosplenomegaly. Bowel sounds positive.  Musculoskeletal: no clubbing / cyanosis. No joint deformity upper and lower extremities. Good ROM, no contractures. Normal muscle tone.  Skin: no rashes, lesions, ulcers. No induration Neurologic: CN 2-12 grossly intact. Sensation intact, DTR normal. Strength 5/5 in all 4.  Psychiatric: Normal judgment  and insight. Alert and oriented x 3. Normal mood.    Labs on Admission: I have personally reviewed following labs and imaging studies  CBC: Recent Labs  Lab 05/22/24 1634  WBC 5.3  NEUTROABS 3.0  HGB 12.7*  HCT 38.3*  MCV 85.7  PLT 219   Basic Metabolic Panel: Recent Labs  Lab 05/22/24 1634  NA 138  K 4.7  CL 110  CO2 20*  GLUCOSE 127*  BUN 25*  CREATININE 1.96*  CALCIUM  9.0   GFR: Estimated Creatinine Clearance: 50.2 mL/min (A) (by C-G formula based on SCr of  1.96 mg/dL (H)). Liver Function Tests: Recent Labs  Lab 05/22/24 1634  AST 32  ALT 27  ALKPHOS 47  BILITOT 0.8  PROT 6.0*  ALBUMIN 3.2*   No results for input(s): LIPASE, AMYLASE in the last 168 hours. No results for input(s): AMMONIA in the last 168 hours. Coagulation Profile: No results for input(s): INR, PROTIME in the last 168 hours. Cardiac Enzymes: No results for input(s): CKTOTAL, CKMB, CKMBINDEX, TROPONINI in the last 168 hours. BNP (last 3 results) No results for input(s): PROBNP in the last 8760 hours. HbA1C: No results for input(s): HGBA1C in the last 72 hours. CBG: Recent Labs  Lab 05/22/24 2245 05/23/24 0012  GLUCAP 123* 61*   Lipid Profile: No results for input(s): CHOL, HDL, LDLCALC, TRIG, CHOLHDL, LDLDIRECT in the last 72 hours. Thyroid Function Tests: No results for input(s): TSH, T4TOTAL, FREET4, T3FREE, THYROIDAB in the last 72 hours. Anemia Panel: No results for input(s): VITAMINB12, FOLATE, FERRITIN, TIBC, IRON, RETICCTPCT in the last 72 hours. Urine analysis:    Component Value Date/Time   COLORURINE YELLOW 11/17/2022 2040   APPEARANCEUR CLEAR 11/17/2022 2040   APPEARANCEUR Clear 02/26/2020 1647   LABSPEC 1.010 11/17/2022 2040   PHURINE 5.0 11/17/2022 2040   GLUCOSEU >=500 (A) 11/17/2022 2040   HGBUR SMALL (A) 11/17/2022 2040   BILIRUBINUR NEGATIVE 11/17/2022 2040   BILIRUBINUR negative 02/26/2020 1651    BILIRUBINUR Negative 02/26/2020 1647   KETONESUR 5 (A) 11/17/2022 2040   PROTEINUR >=300 (A) 11/17/2022 2040   UROBILINOGEN 1.0 04/16/2021 1923   NITRITE NEGATIVE 11/17/2022 2040   LEUKOCYTESUR NEGATIVE 11/17/2022 2040    Radiological Exams on Admission: DG Chest 2 View Result Date: 05/22/2024 EXAM: 2 VIEW(S) XRAY OF THE CHEST 05/22/2024 05:16:00 PM COMPARISON: Comparison with 06/04/2022. CLINICAL HISTORY: edema edema FINDINGS: LUNGS AND PLEURA: No focal pulmonary opacity. No pleural effusion. No pneumothorax. HEART AND MEDIASTINUM: No acute abnormality of the cardiac and mediastinal silhouettes. BONES AND SOFT TISSUES: No acute osseous abnormality. IMPRESSION: 1. No acute cardiopulmonary process. Electronically signed by: Elsie Gravely MD 05/22/2024 05:24 PM EST RP Workstation: [id number]    EKG: Independently reviewed.   Assessment/Plan  Hypertensive Urgency  -in setting of CKDIIIb -patient does not appear overloaded on exam  -admit to progressive care  - prn hydralazine , transition to drip if bp not controlled  -start oral hydralazine   -check ua  -consider nephrology consult in am   CKDIIB -baseline 1.7-2 currently at baseline   HLD  - continue  statin   DM type 1 -iss/fs  -resume home insulin  /insulin  pump  Depression  PTSD -resume home regimen once med rec completed    Seizure -in setting of hypoglycemia - currently monitored of Seizure medications as per neurology last note    DVT prophylaxis: heparin Code Status: full/ as discussed per patient wishes in event of cardiac arrest Family Communication: none at bedside  Disposition Plan:patient  expected to be admitted greater than 2 midnights  Consults called:  nephrology please consult in am  Admission status: progressive care   Camila DELENA Ned MD [facility]   If 7PM-7AM, please contact night-coverage [email or web address] Password [email or web address]  05/23/2024, 1:37 AM

## 2024-05-23 NOTE — ED Notes (Signed)
 Patient transported to CT

## 2024-05-23 NOTE — Plan of Care (Signed)
  Problem: Education: Goal: Ability to describe self-care measures that may prevent or decrease complications (Diabetes Survival Skills Education) will improve Outcome: Progressing   Problem: Coping: Goal: Ability to adjust to condition or change in health will improve Outcome: Progressing   Problem: Fluid Volume: Goal: Ability to maintain a balanced intake and output will improve Outcome: Progressing   Problem: Metabolic: Goal: Ability to maintain appropriate glucose levels will improve Outcome: Progressing   Problem: Skin Integrity: Goal: Risk for impaired skin integrity will decrease Outcome: Progressing   Problem: Coping: Goal: Level of anxiety will decrease Outcome: Progressing   Problem: Nutrition: Goal: Adequate nutrition will be maintained Outcome: Progressing   Problem: Pain Managment: Goal: General experience of comfort will improve and/or be controlled Outcome: Progressing

## 2024-05-23 NOTE — ED Notes (Addendum)
Patient given 8oz of apple juice

## 2024-05-24 ENCOUNTER — Inpatient Hospital Stay (HOSPITAL_COMMUNITY): Payer: MEDICAID

## 2024-05-24 DIAGNOSIS — I160 Hypertensive urgency: Secondary | ICD-10-CM

## 2024-05-24 DIAGNOSIS — I10 Essential (primary) hypertension: Secondary | ICD-10-CM | POA: Diagnosis not present

## 2024-05-24 LAB — GLUCOSE, CAPILLARY
Glucose-Capillary: 135 mg/dL — ABNORMAL HIGH (ref 70–99)
Glucose-Capillary: 148 mg/dL — ABNORMAL HIGH (ref 70–99)
Glucose-Capillary: 230 mg/dL — ABNORMAL HIGH (ref 70–99)
Glucose-Capillary: 122 mg/dL — ABNORMAL HIGH (ref 70–99)
Glucose-Capillary: 75 mg/dL (ref 70–99)
Glucose-Capillary: 193 mg/dL — ABNORMAL HIGH (ref 70–99)

## 2024-05-24 LAB — ECHOCARDIOGRAM COMPLETE
AR max vel: 1.51 cm2
AV Area VTI: 1.4 cm2
AV Area mean vel: 1.46 cm2
AV Mean grad: 9 mmHg
AV Peak grad: 16.6 mmHg
Ao pk vel: 2.04 m/s
Area-P 1/2: 2.69 cm2
Calc EF: 73.8 %
Height: 71 in
S' Lateral: 3 cm
Single Plane A2C EF: 72.3 %
Single Plane A4C EF: 75.5 %
Weight: 3195.79 [oz_av]

## 2024-05-24 MED ORDER — GABAPENTIN 300 MG PO CAPS
600.0000 mg | ORAL_CAPSULE | Freq: Every day | ORAL | Status: DC
  Administered 2024-05-24: 600 mg via ORAL

## 2024-05-24 MED ORDER — HYDRALAZINE HCL 50 MG PO TABS: 75.0000 mg | ORAL_TABLET | Freq: Three times a day (TID) | ORAL | Status: DC

## 2024-05-24 MED ORDER — GABAPENTIN 300 MG PO CAPS: 600.0000 mg | ORAL_CAPSULE | Freq: Every day | ORAL | Status: DC

## 2024-05-24 MED ORDER — HYDRALAZINE HCL 50 MG PO TABS
50.0000 mg | ORAL_TABLET | Freq: Three times a day (TID) | ORAL | Status: DC
  Administered 2024-05-24 – 2024-05-25 (×3): 50 mg via ORAL
  Filled 2024-05-24 (×3): qty 1

## 2024-05-24 MED ORDER — GABAPENTIN 300 MG PO CAPS
600.0000 mg | ORAL_CAPSULE | Freq: Every day | ORAL | Status: DC
Start: 2024-05-24 — End: 2024-05-24
  Administered 2024-05-24: 600 mg via ORAL
  Filled 2024-05-24 (×2): qty 2

## 2024-05-24 NOTE — Progress Notes (Signed)
  Echocardiogram 2D Echocardiogram has been performed.  [name] 05/24/2024, 2:01 PM

## 2024-05-24 NOTE — Plan of Care (Signed)
 Problem: Education: Goal: Ability to describe self-care measures that may prevent or decrease complications (Diabetes Survival Skills Education) will improve Outcome: Progressing Goal: Individualized Educational Video(s) Outcome: Progressing   Problem: Coping: Goal: Ability to adjust to condition or change in health will improve Outcome: Progressing   Problem: Fluid Volume: Goal: Ability to maintain a balanced intake and output will improve Outcome: Progressing   Problem: Health Behavior/Discharge Planning: Goal: Ability to identify and utilize available resources and services will improve Outcome: Progressing Goal: Ability to manage health-related needs will improve Outcome: Progressing   Problem: Metabolic: Goal: Ability to maintain appropriate glucose levels will improve Outcome: Progressing   Problem: Nutritional: Goal: Maintenance of adequate nutrition will improve Outcome: Progressing Goal: Progress toward achieving an optimal weight will improve Outcome: Progressing   Problem: Skin Integrity: Goal: Risk for impaired skin integrity will decrease Outcome: Progressing   Problem: Tissue Perfusion: Goal: Adequacy of tissue perfusion will improve Outcome: Progressing   Problem: Education: Goal: Knowledge of General Education information will improve Description: Including pain rating scale, medication(s)/side effects and non-pharmacologic comfort measures Outcome: Progressing   Problem: Health Behavior/Discharge Planning: Goal: Ability to manage health-related needs will improve Outcome: Progressing   Problem: Clinical Measurements: Goal: Ability to maintain clinical measurements within normal limits will improve Outcome: Progressing Goal: Will remain free from infection Outcome: Progressing Goal: Diagnostic test results will improve Outcome: Progressing Goal: Respiratory complications will improve Outcome: Progressing Goal: Cardiovascular complication will  be avoided Outcome: Progressing   Problem: Activity: Goal: Risk for activity intolerance will decrease Outcome: Progressing   Problem: Nutrition: Goal: Adequate nutrition will be maintained Outcome: Progressing   Problem: Coping: Goal: Level of anxiety will decrease Outcome: Progressing   Problem: Elimination: Goal: Will not experience complications related to bowel motility Outcome: Progressing Goal: Will not experience complications related to urinary retention Outcome: Progressing   Problem: Pain Managment: Goal: General experience of comfort will improve and/or be controlled Outcome: Progressing   Problem: Safety: Goal: Ability to remain free from injury will improve Outcome: Progressing   Problem: Skin Integrity: Goal: Risk for impaired skin integrity will decrease Outcome: Progressing   Problem: Education: Goal: Ability to describe self-care measures that may prevent or decrease complications (Diabetes Survival Skills Education) will improve Outcome: Progressing Goal: Individualized Educational Video(s) Outcome: Progressing   Problem: Coping: Goal: Ability to adjust to condition or change in health will improve Outcome: Progressing   Problem: Fluid Volume: Goal: Ability to maintain a balanced intake and output will improve Outcome: Progressing   Problem: Health Behavior/Discharge Planning: Goal: Ability to identify and utilize available resources and services will improve Outcome: Progressing Goal: Ability to manage health-related needs will improve Outcome: Progressing   Problem: Metabolic: Goal: Ability to maintain appropriate glucose levels will improve Outcome: Progressing   Problem: Nutritional: Goal: Maintenance of adequate nutrition will improve Outcome: Progressing Goal: Progress toward achieving an optimal weight will improve Outcome: Progressing   Problem: Skin Integrity: Goal: Risk for impaired skin integrity will decrease Outcome:  Progressing   Problem: Tissue Perfusion: Goal: Adequacy of tissue perfusion will improve Outcome: Progressing   Problem: Education: Goal: Knowledge of General Education information will improve Description: Including pain rating scale, medication(s)/side effects and non-pharmacologic comfort measures Outcome: Progressing   Problem: Health Behavior/Discharge Planning: Goal: Ability to manage health-related needs will improve Outcome: Progressing   Problem: Clinical Measurements: Goal: Ability to maintain clinical measurements within normal limits will improve Outcome: Progressing Goal: Will remain free from infection Outcome: Progressing Goal: Diagnostic test results will  improve Outcome: Progressing Goal: Respiratory complications will improve Outcome: Progressing Goal: Cardiovascular complication will be avoided Outcome: Progressing   Problem: Activity: Goal: Risk for activity intolerance will decrease Outcome: Progressing   Problem: Nutrition: Goal: Adequate nutrition will be maintained Outcome: Progressing   Problem: Coping: Goal: Level of anxiety will decrease Outcome: Progressing   Problem: Elimination: Goal: Will not experience complications related to bowel motility Outcome: Progressing Goal: Will not experience complications related to urinary retention Outcome: Progressing   Problem: Pain Managment: Goal: General experience of comfort will improve and/or be controlled Outcome: Progressing   Problem: Safety: Goal: Ability to remain free from injury will improve Outcome: Progressing   Problem: Skin Integrity: Goal: Risk for impaired skin integrity will decrease Outcome: Progressing   Problem: Education: Goal: Ability to describe self-care measures that may prevent or decrease complications (Diabetes Survival Skills Education) will improve Outcome: Progressing Goal: Individualized Educational Video(s) Outcome: Progressing   Problem: Coping: Goal:  Ability to adjust to condition or change in health will improve Outcome: Progressing   Problem: Fluid Volume: Goal: Ability to maintain a balanced intake and output will improve Outcome: Progressing   Problem: Health Behavior/Discharge Planning: Goal: Ability to identify and utilize available resources and services will improve Outcome: Progressing Goal: Ability to manage health-related needs will improve Outcome: Progressing   Problem: Metabolic: Goal: Ability to maintain appropriate glucose levels will improve Outcome: Progressing   Problem: Nutritional: Goal: Maintenance of adequate nutrition will improve Outcome: Progressing Goal: Progress toward achieving an optimal weight will improve Outcome: Progressing   Problem: Skin Integrity: Goal: Risk for impaired skin integrity will decrease Outcome: Progressing   Problem: Tissue Perfusion: Goal: Adequacy of tissue perfusion will improve Outcome: Progressing   Problem: Education: Goal: Knowledge of General Education information will improve Description: Including pain rating scale, medication(s)/side effects and non-pharmacologic comfort measures Outcome: Progressing   Problem: Health Behavior/Discharge Planning: Goal: Ability to manage health-related needs will improve Outcome: Progressing   Problem: Clinical Measurements: Goal: Ability to maintain clinical measurements within normal limits will improve Outcome: Progressing Goal: Will remain free from infection Outcome: Progressing Goal: Diagnostic test results will improve Outcome: Progressing Goal: Respiratory complications will improve Outcome: Progressing Goal: Cardiovascular complication will be avoided Outcome: Progressing   Problem: Activity: Goal: Risk for activity intolerance will decrease Outcome: Progressing   Problem: Nutrition: Goal: Adequate nutrition will be maintained Outcome: Progressing   Problem: Coping: Goal: Level of anxiety will  decrease Outcome: Progressing   Problem: Elimination: Goal: Will not experience complications related to bowel motility Outcome: Progressing Goal: Will not experience complications related to urinary retention Outcome: Progressing   Problem: Pain Managment: Goal: General experience of comfort will improve and/or be controlled Outcome: Progressing   Problem: Safety: Goal: Ability to remain free from injury will improve Outcome: Progressing   Problem: Skin Integrity: Goal: Risk for impaired skin integrity will decrease Outcome: Progressing   Problem: Education: Goal: Ability to describe self-care measures that may prevent or decrease complications (Diabetes Survival Skills Education) will improve Outcome: Progressing Goal: Individualized Educational Video(s) Outcome: Progressing   Problem: Coping: Goal: Ability to adjust to condition or change in health will improve Outcome: Progressing   Problem: Fluid Volume: Goal: Ability to maintain a balanced intake and output will improve Outcome: Progressing   Problem: Health Behavior/Discharge Planning: Goal: Ability to identify and utilize available resources and services will improve Outcome: Progressing Goal: Ability to manage health-related needs will improve Outcome: Progressing   Problem: Metabolic: Goal: Ability to maintain appropriate glucose  levels will improve Outcome: Progressing   Problem: Nutritional: Goal: Maintenance of adequate nutrition will improve Outcome: Progressing Goal: Progress toward achieving an optimal weight will improve Outcome: Progressing   Problem: Skin Integrity: Goal: Risk for impaired skin integrity will decrease Outcome: Progressing   Problem: Tissue Perfusion: Goal: Adequacy of tissue perfusion will improve Outcome: Progressing   Problem: Education: Goal: Knowledge of General Education information will improve Description: Including pain rating scale, medication(s)/side effects  and non-pharmacologic comfort measures Outcome: Progressing   Problem: Health Behavior/Discharge Planning: Goal: Ability to manage health-related needs will improve Outcome: Progressing   Problem: Clinical Measurements: Goal: Ability to maintain clinical measurements within normal limits will improve Outcome: Progressing Goal: Will remain free from infection Outcome: Progressing Goal: Diagnostic test results will improve Outcome: Progressing Goal: Respiratory complications will improve Outcome: Progressing Goal: Cardiovascular complication will be avoided Outcome: Progressing   Problem: Activity: Goal: Risk for activity intolerance will decrease Outcome: Progressing   Problem: Nutrition: Goal: Adequate nutrition will be maintained Outcome: Progressing   Problem: Coping: Goal: Level of anxiety will decrease Outcome: Progressing   Problem: Elimination: Goal: Will not experience complications related to bowel motility Outcome: Progressing Goal: Will not experience complications related to urinary retention Outcome: Progressing   Problem: Pain Managment: Goal: General experience of comfort will improve and/or be controlled Outcome: Progressing   Problem: Safety: Goal: Ability to remain free from injury will improve Outcome: Progressing   Problem: Skin Integrity: Goal: Risk for impaired skin integrity will decrease Outcome: Progressing

## 2024-05-24 NOTE — Progress Notes (Signed)
 PROGRESS NOTE    [name]  FMW:[id number] DOB: [birth year] DOA: 05/22/2024 PCP: Celestia Rosaline SQUIBB, NP    Brief Narrative:   [name] is a 48 y.o. male with medical history significant of HTN , DMI, HLD,PTSD, Prolonged QT, Seizure d/o ,Depression, who presents to ED with complaint of feeling of  whole body swelling and chest pain x 1 day. Patient notes only new medication is Remeron. He notes on increase sob, no fever/chills /n/v. He also noted intermittent HA, no presyncope no palpitations. He states he has felt off for a few weeks and tried to get in with his pcp but was unable to. He notes that his blood pressure has been uncontrolled at home despite compliance with his medication and diet.   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  -baseline 2-2.1 currently at baseline    HLD  - continue  statin    DM type 1 -iss/fs  -resume home insulin  /insulin  pump   Depression  PTSD -resume home regimen     Seizure -in setting of hypoglycemia - currently monitored off Seizure medications as per neurology last note        DVT prophylaxis: heparin injection 5,000 Units Start: 05/23/24 0600    Code Status: Full Code   Disposition Plan:  Level of care: Telemetry Status is: Inpatient     Consultants:    Subjective: No head ache  Objective: Vitals:   05/24/24 0400 05/24/24 0547 05/24/24 0830 05/24/24 1104  BP: (!) 174/77 (!) 142/69 (!) 170/72 134/67  Pulse: 66  83 73  Resp: 18  20 20   Temp: 98.1 F (36.7 C)  98 F (36.7 C) 98.4 F (36.9 C)  TempSrc: Oral  Oral Oral  SpO2: 98%  98% 96%  Weight:  90.6 kg    Height:        Intake/Output Summary (Last 24 hours) at 05/24/2024 1220 Last data filed at 05/24/2024 9170 Gross  per 24 hour  Intake 480 ml  Output --  Net 480 ml   Filed Weights   05/22/24 1616 05/23/24 1554 05/24/24 0547  Weight: 89.4 kg 90.9 kg 90.6 kg    Examination:   General: Appearance:     Overweight male in no acute distress     Lungs:      respirations unlabored  Heart:    Normal heart rate.    MS:   All extremities are intact.    Neurologic:   Awake, alert, oriented x 3. No apparent focal neurological           defect.        Data Reviewed: I have personally reviewed following labs and imaging studies  CBC: Recent Labs  Lab 05/22/24 1634 05/23/24 0410 05/23/24 1556  WBC 5.3 5.5 6.8  NEUTROABS 3.0  --   --   HGB 12.7* 12.5* 13.2  HCT 38.3* 37.9* 39.2  MCV 85.7 84.0 84.1  PLT 219 220 220   Basic Metabolic Panel: Recent Labs  Lab 05/22/24 1634 05/23/24 0410 05/23/24 1556  NA 138  --  137  K 4.7  --  5.0  CL 110  --  107  CO2 20*  --  20*  GLUCOSE 127*  --  84  BUN 25*  --  24*  CREATININE 1.96* 1.84* 2.10*  CALCIUM  9.0  --  9.1   GFR: Estimated Creatinine Clearance: 50.6 mL/min (A) (by C-G formula based on SCr of 2.1 mg/dL (H)). Liver Function Tests: Recent Labs  Lab 05/22/24 1634 05/23/24 1556  AST 32 32  ALT 27 26  ALKPHOS 47 51  BILITOT 0.8 1.0  PROT 6.0* 5.8*  ALBUMIN 3.2* 2.9*   No results for input(s): LIPASE, AMYLASE in the last 168 hours. No results for input(s): AMMONIA in the last 168 hours. Coagulation Profile: No results for input(s): INR, PROTIME in the last 168 hours. Cardiac Enzymes: No results for input(s): CKTOTAL, CKMB, CKMBINDEX, TROPONINI in the last 168 hours. BNP (last 3 results) No results for input(s): PROBNP in the last 8760 hours. HbA1C: No results for input(s): HGBA1C in the last 72 hours. CBG: Recent Labs  Lab 05/23/24 1656 05/23/24 2134 05/24/24 0240 05/24/24 0614 05/24/24 1102  GLUCAP 117* 174* 135* 148* 230*   Lipid Profile: No results for input(s): CHOL, HDL, LDLCALC,  TRIG, CHOLHDL, LDLDIRECT in the last 72 hours. Thyroid Function Tests: No results for input(s): TSH, T4TOTAL, FREET4, T3FREE, THYROIDAB in the last 72 hours. Anemia Panel: No results for input(s): VITAMINB12, FOLATE, FERRITIN, TIBC, IRON, RETICCTPCT in the last 72 hours. Sepsis Labs: No results for input(s): PROCALCITON, LATICACIDVEN in the last 168 hours.  No results found for this or any previous visit (from the past 240 hours).       Radiology Studies: CT CHEST WO CONTRAST Result Date: 05/23/2024 EXAM: CT CHEST WITHOUT CONTRAST 05/23/2024 02:10:30 AM TECHNIQUE: CT of the chest was performed without the administration of intravenous contrast. Multiplanar reformatted images are provided for review. Automated exposure control, iterative reconstruction, and/or weight based adjustment of the mA/kV was utilized to reduce the radiation dose to as low as reasonably achievable. COMPARISON: None available. CLINICAL HISTORY: Chest pain, nonspecific FINDINGS: MEDIASTINUM: Heart and pericardium are unremarkable. The central airways are clear. LYMPH NODES: No mediastinal, hilar or axillary lymphadenopathy. LUNGS AND PLEURA: No focal consolidation or pulmonary edema. No pleural effusion or pneumothorax. SOFT TISSUES/BONES: No acute abnormality of the bones or soft tissues. UPPER ABDOMEN: Limited images of the upper abdomen demonstrates no acute abnormality. IMPRESSION: 1. Negative CT chest. Electronically signed by: Pinkie Pebbles MD 05/23/2024 02:20 AM EST RP Workstation: [id number]   DG Chest 2 View Result Date: 05/22/2024 EXAM: 2 VIEW(S) XRAY OF THE CHEST 05/22/2024 05:16:00 PM COMPARISON: Comparison with 06/04/2022. CLINICAL HISTORY: edema edema FINDINGS: LUNGS AND PLEURA: No focal pulmonary opacity. No pleural effusion. No pneumothorax. HEART AND MEDIASTINUM: No acute abnormality of the cardiac and mediastinal silhouettes. BONES AND SOFT TISSUES: No acute osseous  abnormality. IMPRESSION: 1. No acute cardiopulmonary process. Electronically signed by: Elsie Gravely MD 05/22/2024 05:24 PM EST RP Workstation: [id number]        Scheduled Meds:  gabapentin   600 mg Oral QHS   heparin  5,000 Units Subcutaneous Q8H   hydrALAZINE   50 mg Oral Q8H   insulin  aspart  0-6 Units Subcutaneous TID WC   insulin  pump   Subcutaneous TID WC, HS, 0200   lisinopril   40 mg Oral Daily   rosuvastatin   20 mg Oral Daily   Continuous Infusions:   LOS: 1 day    Time spent: 45 minutes spent on chart review, discussion with nursing staff, consultants, updating family and interview/physical exam; more than 50% of that time was spent in counseling and/or coordination of care.    Phillipe Clemon U Ziggy Chanthavong, DO [facility] Available via  Epic secure chat 7am-7pm After these hours, please refer to coverage provider listed on [email or web address] 05/24/2024, 12:20 PM

## 2024-05-25 ENCOUNTER — Ambulatory Visit: Payer: MEDICAID | Admitting: Internal Medicine

## 2024-05-25 ENCOUNTER — Other Ambulatory Visit (HOSPITAL_COMMUNITY): Payer: Self-pay

## 2024-05-25 DIAGNOSIS — I129 Hypertensive chronic kidney disease with stage 1 through stage 4 chronic kidney disease, or unspecified chronic kidney disease: Secondary | ICD-10-CM

## 2024-05-25 DIAGNOSIS — N183 Chronic kidney disease, stage 3 unspecified: Secondary | ICD-10-CM

## 2024-05-25 DIAGNOSIS — I509 Heart failure, unspecified: Secondary | ICD-10-CM | POA: Diagnosis not present

## 2024-05-25 DIAGNOSIS — E1022 Type 1 diabetes mellitus with diabetic chronic kidney disease: Secondary | ICD-10-CM | POA: Diagnosis not present

## 2024-05-25 DIAGNOSIS — I10 Essential (primary) hypertension: Secondary | ICD-10-CM | POA: Diagnosis not present

## 2024-05-25 LAB — BASIC METABOLIC PANEL WITH GFR
Anion gap: 9 (ref 5–15)
BUN: 37 mg/dL — ABNORMAL HIGH (ref 6–20)
CO2: 24 mmol/L (ref 22–32)
Calcium: 8.6 mg/dL — ABNORMAL LOW (ref 8.9–10.3)
Chloride: 102 mmol/L (ref 98–111)
Creatinine, Ser: 2.28 mg/dL — ABNORMAL HIGH (ref 0.61–1.24)
GFR, Estimated: 35 mL/min — ABNORMAL LOW (ref >60)
Glucose, Bld: 110 mg/dL — ABNORMAL HIGH (ref 70–99)
Potassium: 4.2 mmol/L (ref 3.5–5.1)
Sodium: 135 mmol/L (ref 135–145)

## 2024-05-25 MED ORDER — LISINOPRIL 20 MG PO TABS
20.0000 mg | ORAL_TABLET | Freq: Every day | ORAL | Status: DC
  Administered 2024-05-25: 20 mg via ORAL
  Filled 2024-05-25: qty 1

## 2024-05-25 MED ORDER — BLOOD PRESSURE KIT
1.0000 | PACK | Freq: Every day | 0 refills | Status: AC
  Filled 2024-05-25: qty 1, 30d supply, fill #0

## 2024-05-25 MED ORDER — HYDRALAZINE HCL 50 MG PO TABS
50.0000 mg | ORAL_TABLET | Freq: Three times a day (TID) | ORAL | 1 refills | Status: AC
  Filled 2024-05-25 – 2024-07-06 (×2): qty 90, 30d supply, fill #0

## 2024-05-25 NOTE — Discharge Summary (Signed)
 Physician Discharge Summary  [name] FMW:[id number] DOB: 01-28-1976 DOA: 05/22/2024  PCP: Celestia Rosaline SQUIBB, NP  Admit date: 05/22/2024 Discharge date: 05/25/2024  Admitted From:  Discharge disposition: home   Recommendations for Outpatient Follow-Up:   bmp   Discharge Diagnosis:   Principal Problem:   Fluid overload    Discharge Condition: Improved.  Diet recommendation: Low sodium, heart healthy  Wound care: None.  Code status: Full.   History of Present Illness:    [name] is a 48 y.o. male with medical history significant of HTN , DMI, HLD,PTSD, Prolonged QT, Seizure d/o ,Depression, who presents to ED with complaint of feeling of  whole body swelling and chest pain x 1 day. Patient notes only new medication is Remeron. He notes on increase sob, no fever/chills /n/v. He also noted intermittent HA, no presyncope no palpitations. He states he has felt off for a few weeks and tried to get in with his pcp but was unable to. He notes that his blood pressure has been uncontrolled at home despite compliance with his medication and diet.    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  -baseline 2-2.1 currently at baseline  -bmp 1 week- close nephrology follow up   HLD  - continue  statin    DM type 1 -knows he needs strict control at home   Depression  PTSD -resume home regimen     Seizure -in setting of hypoglycemia - currently monitored off Seizure medications as per neurology last note       Medical Consultants:      Discharge Exam:   Vitals:   05/25/24 0434 05/25/24 0730  BP: 122/65 132/77  Pulse: (!) 58  62  Resp: 18 18  Temp: 98.6 F (37 C) 97.6 F (36.4 C)  SpO2: 96% 98%   Vitals:   05/24/24 1950 05/24/24 2355 05/25/24 0434 05/25/24 0730  BP: (!) 142/71 (!) 163/77 122/65 132/77  Pulse: 76 72 (!) 58 62  Resp: 18 18 18 18   Temp: (!) 97.5 F (36.4 C) 98.2 F (36.8 C) 98.6 F (37 C) 97.6 F (36.4 C)  TempSrc: Oral Temporal Temporal Oral  SpO2: 99% 97% 96% 98%  Weight:      Height:        General exam: Appears calm and comfortable.  The results of significant diagnostics from this hospitalization (including imaging, microbiology, ancillary and laboratory) are listed below for reference.     Procedures and Diagnostic Studies:   CT CHEST WO CONTRAST Result Date: 05/23/2024 EXAM: CT CHEST WITHOUT CONTRAST 05/23/2024 02:10:30 AM TECHNIQUE: CT of the chest was performed without the administration of intravenous contrast. Multiplanar reformatted images are provided for review. Automated exposure control, iterative reconstruction, and/or weight based adjustment of the mA/kV was utilized to reduce the radiation dose to as low as reasonably achievable. COMPARISON: None available. CLINICAL HISTORY: Chest pain, nonspecific FINDINGS: MEDIASTINUM: Heart and pericardium are unremarkable. The central airways are clear. LYMPH NODES: No mediastinal, hilar or axillary lymphadenopathy. LUNGS AND PLEURA: No focal consolidation or pulmonary edema. No pleural effusion or pneumothorax. SOFT TISSUES/BONES: No acute abnormality of the bones or soft tissues. UPPER ABDOMEN: Limited images of the upper  abdomen demonstrates no acute abnormality. IMPRESSION: 1. Negative CT chest. Electronically signed by: Pinkie Pebbles MD 05/23/2024 02:20 AM EST RP Workstation: [id number]   DG Chest 2 View Result Date: 05/22/2024 EXAM: 2 VIEW(S) XRAY OF THE CHEST 05/22/2024 05:16:00 PM COMPARISON: Comparison with 06/04/2022. CLINICAL HISTORY: edema edema FINDINGS: LUNGS AND PLEURA: No focal pulmonary opacity. No pleural  effusion. No pneumothorax. HEART AND MEDIASTINUM: No acute abnormality of the cardiac and mediastinal silhouettes. BONES AND SOFT TISSUES: No acute osseous abnormality. IMPRESSION: 1. No acute cardiopulmonary process. Electronically signed by: Elsie Gravely MD 05/22/2024 05:24 PM EST RP Workstation: [id number]     Labs:   Basic Metabolic Panel: Recent Labs  Lab 05/22/24 1634 05/23/24 0410 05/23/24 1556 05/25/24 0217  NA 138  --  137 135  K 4.7  --  5.0 4.2  CL 110  --  107 102  CO2 20*  --  20* 24  GLUCOSE 127*  --  84 110*  BUN 25*  --  24* 37*  CREATININE 1.96* 1.84* 2.10* 2.28*  CALCIUM  9.0  --  9.1 8.6*   GFR Estimated Creatinine Clearance: 46.6 mL/min (A) (by C-G formula based on SCr of 2.28 mg/dL (H)). Liver Function Tests: Recent Labs  Lab 05/22/24 1634 05/23/24 1556  AST 32 32  ALT 27 26  ALKPHOS 47 51  BILITOT 0.8 1.0  PROT 6.0* 5.8*  ALBUMIN 3.2* 2.9*   No results for input(s): LIPASE, AMYLASE in the last 168 hours. No results for input(s): AMMONIA in the last 168 hours. Coagulation profile No results for input(s): INR, PROTIME in the last 168 hours.  CBC: Recent Labs  Lab 05/22/24 1634 05/23/24 0410 05/23/24 1556  WBC 5.3 5.5 6.8  NEUTROABS 3.0  --   --   HGB 12.7* 12.5* 13.2  HCT 38.3* 37.9* 39.2  MCV 85.7 84.0 84.1  PLT 219 220 220   Cardiac Enzymes: No results for input(s): CKTOTAL, CKMB, CKMBINDEX, TROPONINI in the last 168 hours. BNP: Invalid input(s): POCBNP CBG: Recent Labs  Lab 05/24/24 0614 05/24/24 1102 05/24/24 1530 05/24/24 1601 05/24/24 2053  GLUCAP 148* 230* 75 122* 193*   D-Dimer Recent Labs    05/23/24 0155  DDIMER 0.36   Hgb A1c No results for input(s): HGBA1C in the last 72 hours. Lipid Profile No results for input(s): CHOL, HDL, LDLCALC, TRIG, CHOLHDL, LDLDIRECT in the last 72 hours. Thyroid function studies No results for input(s): TSH, T4TOTAL, T3FREE, THYROIDAB  in the last 72 hours.  Invalid input(s): FREET3 Anemia work up No results for input(s): VITAMINB12, FOLATE, FERRITIN, TIBC, IRON, RETICCTPCT in the last 72 hours. Microbiology No results found for this or any previous visit (from the past 240 hours).   Discharge Instructions:   Discharge Instructions     Diet - low sodium heart healthy   Complete by: As directed    Diet Carb Modified   Complete by: As directed    Increase activity slowly   Complete by: As directed       Allergies as of 05/25/2024       Reactions   Geodon [ziprasidone Hcl] Other (See Comments)   Severe muscle spasms (ENTIRE BODY)- had to call EMS   Amlodipine  Hives, Swelling   Lower extremities        Medication List     STOP taking these medications    cloNIDine  0.2 MG tablet Commonly known as: Catapres        TAKE these medications    Accu-Chek Guide test  strip Generic drug: glucose blood USE TO test blood sugar six times EVERY DAY AS DIRECTED   Accu-Chek Guide w/Device Kit Use to check blood sugar up to 6 times daily.   Accu-Chek Softclix Lancets lancets Use to check blood sugar up to 6 times daily.   [employer] Advanced BP Monitor Devi 1 Device by Does not apply route daily.   Dexcom G7 Sensor Misc Change sensors every 10 days   gabapentin  300 MG capsule Commonly known as: NEURONTIN  Take 2 capsules (600 mg total) by mouth at bedtime as needed. What changed: when to take this   Global Inject Ease Insulin  Syr 30G X 1/2 1 ML Misc Generic drug: Insulin  Syringe-Needle U-100 USE WITH insulin  DAILY in THE afternoon   hydrALAZINE  50 MG tablet Commonly known as: APRESOLINE  Take 1 tablet (50 mg total) by mouth 3 (three) times daily.   hydrOXYzine  25 MG tablet Commonly known as: ATARAX  Take 1 tablet (25 mg total) by mouth 2 (two) times daily as needed for anxiety.   insulin  aspart 100 UNIT/ML injection Commonly known as: NovoLOG  Max daily dose of 50 units into the skin  daily   lisinopril -hydrochlorothiazide  20-25 MG tablet Commonly known as: ZESTORETIC  Take 1 tablet by mouth daily.   mirtazapine 7.5 MG tablet Commonly known as: REMERON Take 1 tablet (7.5 mg total) by mouth at bedtime.   Omnipod 5 G7 Pods (Gen 5) Misc 1 Device by Does not apply route every other day.   Omnipod 5 DexG7G6 Pods Gen 5 Misc Apply one device every other day.   rosuvastatin  20 MG tablet Commonly known as: Crestor  Take 1 tablet (20 mg total) by mouth daily.   VITAMIN D3 PO Take 1 tablet by mouth 2 (two) times a week.        Follow-up Information     Celestia Rosaline SQUIBB, NP Follow up.   Specialty: Internal Medicine Why: BMP 1 week Contact information: [address] [place] [address] [phone]         Jerrye Katheryn BROCKS, MD Follow up.   Specialty: Nephrology Contact information: [address] [place] [address] [phone]                  Time coordinating discharge: 45 min  Signed:  Harlene RAYMOND Bowl DO  [facility] 05/25/2024, 8:37 AM

## 2024-05-25 NOTE — Progress Notes (Deleted)
 Name: [name]  MRN/ DOB: [id number], 20-Oct-1975   Age/ Sex: 48 y.o., male    PCP: Celestia Rosaline SQUIBB, NP   Reason for Endocrinology Evaluation: Type 1 Diabetes Mellitus     Date of Initial Endocrinology Visit: 12/11/2022    PATIENT IDENTIFIER: [name] is a 48 y.o. male with a past medical history of HTN, DM. The patient presented for initial endocrinology clinic visit on 12/11/2022 for consultative assistance with his diabetes management.    HPI: [name] was    Diagnosed with DM at age 4 Prior Medications tried/Intolerance: insulin  .  He was on the pump as a child Currently checking blood sugars multiple  x / day Hypoglycemia episodes : yes               Symptoms: yes                 Frequency: 2/day Hemoglobin A1c has ranged from 7.8% in 2010, peaking at 12.7 % in 2021.   Ed visit 11/17/2022  In terms of diet, the patient eats 4 meals, snacks occasionally.   Had nausea in the morning but this has resolved  Denies constipation or diarrhea    On his initial visit to our clinic his A1c was 9.4%, he was on MDI regimen which was adjusted  He was subsequently trained on OmniPod use 01/2023  SUBJECTIVE:   During the last visit (01/28/2024): A1c 7.8%    Today (05/25/24): [name] is here for follow-up on diabetes management.  He checks his glucose multiple times daily through CGM.  The patient has been noted with hypoglycemia, he is symptomatic with these episodes   Denies nausea or vomiting  Denies constipation or diarrhea except last month had transient constipation     This patient with type 1 diabetes is treated with Omnipod (insulin  pump). During the visit the pump basal and bolus doses were reviewed including carb/insulin  rations and supplemental doses. The clinical list was updated. The glucose meter download was reviewed in detail to determine if the current pump settings are providing the best glycemic control without excessive hypoglycemia.  Pump  and meter download:    Pump   Omnipod Settings   Insulin  type   Novolog    Basal rate       0000 0.3 u/h               I:C ratio       0000 1:13    Enter 52 g               Sensitivity       0000  60      Goal       0000  120         Type & Model of Pump: omnipod Insulin  Type: Currently using Novolog .  There is no height or weight on file to calculate BMI.  PUMP STATISTICS: Average BG: *** Average Daily Carbs (g): 82.3 Average Total Daily Insulin : 36.7 Average Daily Basal: 21.5 (59 %) Average Daily Bolus: 15.2(41 %)     HOME DIABETES REGIMEN: Novolog   Rosuvastatin  20 mg daily  Statin: No ACE-I/ARB: No    CONTINUOUS GLUCOSE MONITORING RECORD INTERPRETATION    Dates of Recording:11/1-11/14/2025  Sensor description: dexcom   Results statistics:   CGM use % of time 97  Average and SD 181/63  Time in range 57 %  % Time Above 180 26  % Time above 250 16  %  Time Below target 1   Glycemic patterns summary: BGs fluctuate throughout the day and night Hyperglycemic episodes postprandial  Hypoglycemic episodes occurred no pattern, but mostly after bolus  Overnight periods: Mostly optimal    DIABETIC COMPLICATIONS: Microvascular complications:  CKD, neuropathy Denies:  Last eye exam: Completed 05/10/2021  Macrovascular complications:   Denies: CAD, PVD, CVA   PAST HISTORY: Past Medical History:  Past Medical History:  Diagnosis Date   Depression    Diabetes mellitus    Diabetes mellitus type I (HCC)    Hypertension    Prolonged QT interval 04/02/2019   PTSD (post-traumatic stress disorder)    Seizures (HCC)    Past Surgical History:  Past Surgical History:  Procedure Laterality Date   HERNIA REPAIR      Social History:  reports that he quit smoking about 20 months ago. His smoking use included cigarettes. He has never used smokeless tobacco. He reports that he does not currently use alcohol after a past usage of about 1.0  standard drink of alcohol per week. He reports that he does not use drugs. Family History:  Family History  Problem Relation Age of Onset   Hypertension Mother    Depression Mother    Drug abuse Mother    Diabetes Paternal Uncle      HOME MEDICATIONS: Allergies as of 05/25/2024       Reactions   Geodon [ziprasidone Hcl] Other (See Comments)   Severe muscle spasms (ENTIRE BODY)- had to call EMS   Amlodipine  Hives, Swelling   Lower extremities        Medication List      Notice   This visit is during an admission. Changes to the med list made in this visit will be reflected in the After Visit Summary of the admission.      ALLERGIES: Allergies  Allergen Reactions   Geodon [Ziprasidone Hcl] Other (See Comments)    Severe muscle spasms (ENTIRE BODY)- had to call EMS   Amlodipine  Hives and Swelling    Lower extremities     REVIEW OF SYSTEMS: A comprehensive ROS was conducted with the patient and is negative except as per HPI    OBJECTIVE:   VITAL SIGNS: There were no vitals taken for this visit.   PHYSICAL EXAM:  General: Pt appears well and is in NAD  Neuro: MS is good with appropriate affect, pt is alert and Ox3    DM foot exam: 05/28/2023  The skin of the feet is intact without sores or ulcerations. The pedal pulses are 2+ on right and 2+ on left. The sensation is intact to a screening 5.07, 10 gram monofilament bilaterally    DATA REVIEWED:  Lab Results  Component Value Date   HGBA1C 7.8 (H) 01/06/2024   HGBA1C 8.2 (A) 05/28/2023   HGBA1C 9.4 (A) 11/15/2022    Latest Reference Range & Units 05/25/24 02:17  Sodium 135 - 145 mmol/L 135  Potassium 3.5 - 5.1 mmol/L 4.2  Chloride 98 - 111 mmol/L 102  CO2 22 - 32 mmol/L 24  Glucose 70 - 99 mg/dL 889 (H)  BUN 6 - 20 mg/dL 37 (H)  Creatinine 9.38 - 1.24 mg/dL 7.71 (H)  Calcium  8.9 - 10.3 mg/dL 8.6 (L)  Anion gap 5 - 15  9  GFR, Estimated >60 mL/min 35 (L)  (H): Data is abnormally high (L): Data  is abnormally low   Old records , labs and images have been reviewed.   ASSESSMENT / PLAN /  RECOMMENDATIONS:   1) Type 1 Diabetes Mellitus, Sub-Optimally controlled, With CKD III , and neuropathic complications - A1c 7.8%   - A1c is trending down but continues to be above goal -I have reviewed his pump/CGM download, the patient continues with inconsistent bolus with meals.  He has not been able to count carbohydrates with each meal, I have asked him to enter 52 g of carbohydrates with each meal from now on - He has been noted with hypoglycemia with correction, will change correction factor from 55 to 60 - He was also asked to use the OmniPod app on his phone instead of the PDM, as he did have an incident where he forgot the PDM at someone's house   MEDICATIONS: Novolog      Pump   Omnipod Settings   Insulin  type   Novolog    Basal rate       0000 0.3 u/h               I:C ratio       0000 1:13    Enter 52 g withmeals              Sensitivity       0000  60      Goal       0000  120         EDUCATION / INSTRUCTIONS: BG monitoring instructions: Patient is instructed to check his blood sugars 3 times a day, before meals. Call [facility] if: BG persistently < 70  I reviewed the Rule of 15 for the treatment of hypoglycemia in detail with the patient. Literature supplied.   2) Diabetic complications:  Eye: Does not have known diabetic retinopathy.  Neuro/ Feet: Does  have known diabetic peripheral neuropathy. Renal: Patient does  have known baseline CKD. He is not on an ACEI/ARB at present.  3) Dyslipidemia :  -He was on Simvastatin  that was disconitnued for no known reason during hospitalization -I started him on rosuvastatin  12/2022 due to  high LDL at 170 mg/DL -Pt with social determinants and admits to imperfect adherence to statin therapy   Medication  Continue rosuvastatin  20 mg daily    Follow-up in 4 months  Signed electronically  by: Stefano Redgie Butts, MD  [facility] End[facility]  [facility] [address] [place] [place], [place] [address] Phone: [phone] FAX: [phone]   CC: Celestia Rosaline SQUIBB, NP [address] [place] [address] Phone: [phone]  Fax: [phone]    Return to Endocrinology clinic as below: Future Appointments  Date Time Provider Department Center  05/25/2024  9:50 AM Prentiss Hammett, Donell Redgie, MD [facility] None  06/01/2024  4:00 PM Grant Juliene RAMAN, LCSW [facility] None  06/08/2024  3:30 PM Celestia Rosaline SQUIBB, NP [facility] [address]  06/11/2024  2:30 PM Graham Krabbe, MD [facility] None  12/08/2024  1:30 PM Orman Erminio POUR, PA-C [facility] None

## 2024-05-25 NOTE — TOC Transition Note (Signed)
 Transition of Care [facility]) - Discharge Note   Patient Details  Name: KDYN VONBEHREN MRN: [id number] Date of Birth: 04/05/1976  Transition of Care [facility]) CM/SW Contact:  Waddell Barnie Rama, RN Phone Number: 05/25/2024, 8:52 AM   Clinical Narrative:    For dc today, he will need a cab at dc.           Patient Goals and CMS Choice            Discharge Placement                       Discharge Plan and Services Additional resources added to the After Visit Summary for                                       Social Drivers of Health (SDOH) Interventions SDOH Screenings   Food Insecurity: Food Insecurity Present (05/23/2024)  Housing: High Risk (05/23/2024)  Transportation Needs: Unmet Transportation Needs (05/23/2024)  Utilities: Not At Risk (05/23/2024)  Alcohol Screen: Low Risk  (10/18/2023)  Depression (PHQ2-9): High Risk (12/04/2023)  Financial Resource Strain: High Risk (10/18/2023)  Physical Activity: Sufficiently Active (11/15/2022)  Social Connections: Moderately Isolated (10/18/2023)  Stress: Stress Concern Present (10/18/2023)  Tobacco Use: Medium Risk (05/22/2024)  Health Literacy: Adequate Health Literacy (10/18/2023)     Readmission Risk Interventions    05/25/2024    8:47 AM 08/24/2022    2:43 PM  Readmission Risk Prevention Plan  Post Dischage Appt  Complete  Medication Screening  Complete  Transportation Screening Complete Complete  PCP or Specialist Appt within 5-7 Days Complete   Home Care Screening Complete   Medication Review (RN CM) Complete

## 2024-05-25 NOTE — Plan of Care (Signed)
  Problem: Education: Goal: Ability to describe self-care measures that may prevent or decrease complications (Diabetes Survival Skills Education) will improve Outcome: Progressing   Problem: Coping: Goal: Ability to adjust to condition or change in health will improve Outcome: Progressing   Problem: Fluid Volume: Goal: Ability to maintain a balanced intake and output will improve Outcome: Progressing   Problem: Metabolic: Goal: Ability to maintain appropriate glucose levels will improve Outcome: Progressing   Problem: Skin Integrity: Goal: Risk for impaired skin integrity will decrease Outcome: Progressing   Problem: Education: Goal: Knowledge of General Education information will improve Description: Including pain rating scale, medication(s)/side effects and non-pharmacologic comfort measures Outcome: Progressing   Problem: Pain Managment: Goal: General experience of comfort will improve and/or be controlled Outcome: Progressing   Problem: Safety: Goal: Ability to remain free from injury will improve Outcome: Progressing

## 2024-05-25 NOTE — TOC CM/SW Note (Signed)
 Transition of Care [facility]) - Inpatient Brief Assessment   Patient Details  Name: JAKOTA MANTHEI MRN: [id number] Date of Birth: 03-02-1976  [facility]) CM/SW Contact:    Waddell Barnie Rama, RN Phone Number: 05/25/2024, 8:49 AM   Clinical Narrative: From home with a friend, indep states PCP and insurance on file, states has no HH services in place at this time or DME at home.  States  at [employer] he will need a cab . States gets medications from [facility].  Pta self ambulatory.   There are no ICM needs identified  at this time.  Please place consult for ICM needs.     Transition of Care Asessment: Insurance and Status: Insurance coverage has been reviewed Patient has primary care physician: Yes Home environment has been reviewed: home with a friend Prior level of function:: indep Prior/Current Home Services: No current home services Social Drivers of Health Review: SDOH reviewed no interventions necessary Readmission risk has been reviewed: Yes Transition of care needs: no transition of care needs at this time

## 2024-05-26 ENCOUNTER — Telehealth: Payer: Self-pay

## 2024-05-26 ENCOUNTER — Ambulatory Visit (INDEPENDENT_AMBULATORY_CARE_PROVIDER_SITE_OTHER): Payer: MEDICAID | Admitting: Internal Medicine

## 2024-05-26 ENCOUNTER — Encounter: Payer: Self-pay | Admitting: Internal Medicine

## 2024-05-26 VITALS — BP 144/84 | Ht 71.0 in | Wt 201.8 lb

## 2024-05-26 DIAGNOSIS — E1040 Type 1 diabetes mellitus with diabetic neuropathy, unspecified: Secondary | ICD-10-CM

## 2024-05-26 DIAGNOSIS — E1065 Type 1 diabetes mellitus with hyperglycemia: Secondary | ICD-10-CM

## 2024-05-26 DIAGNOSIS — E1022 Type 1 diabetes mellitus with diabetic chronic kidney disease: Secondary | ICD-10-CM

## 2024-05-26 DIAGNOSIS — N1831 Chronic kidney disease, stage 3a: Secondary | ICD-10-CM

## 2024-05-26 DIAGNOSIS — E785 Hyperlipidemia, unspecified: Secondary | ICD-10-CM

## 2024-05-26 LAB — POCT GLYCOSYLATED HEMOGLOBIN (HGB A1C): Hemoglobin A1C: 7.5 % — AB (ref 4.0–5.6)

## 2024-05-26 LAB — MISC LABCORP TEST (SEND OUT): Labcorp test code: 83935

## 2024-05-26 NOTE — Transitions of Care (Post Inpatient/ED Visit) (Signed)
   05/26/2024  Name: [name] MRN: [id number] DOB: 21-Nov-1975  Today's TOC FU Call Status: Today's TOC FU Call Status:: Unsuccessful Call (1st Attempt) Unsuccessful Call (1st Attempt) Date: 05/26/24  Attempted to reach the patient regarding the most recent Inpatient/ED visit.  Follow Up Plan: Additional outreach attempts will be made to reach the patient to complete the Transitions of Care (Post Inpatient/ED visit) call.   Signature  Slater Diesel, RN

## 2024-05-26 NOTE — Progress Notes (Signed)
 Name: [name]  MRN/ DOB: [id number], April 02, 1976   Age/ Sex: 48 y.o., male    PCP: Celestia Rosaline SQUIBB, NP   Reason for Endocrinology Evaluation: Type 1 Diabetes Mellitus     Date of Initial Endocrinology Visit: 12/11/2022    PATIENT IDENTIFIER: [name] is a 48 y.o. male with a past medical history of HTN, DM. The patient presented for initial endocrinology clinic visit on 12/11/2022 for consultative assistance with his diabetes management.    HPI: Mr. Hammond was    Diagnosed with DM at age 82 Prior Medications tried/Intolerance: insulin  .  He was on the pump as a child Currently checking blood sugars multiple  x / day Hypoglycemia episodes : yes               Symptoms: yes                 Frequency: 2/day Hemoglobin A1c has ranged from 7.8% in 2010, peaking at 12.7 % in 2021.   Ed visit 11/17/2022  In terms of diet, the patient eats 4 meals, snacks occasionally.   Had nausea in the morning but this has resolved  Denies constipation or diarrhea    On his initial visit to our clinic his A1c was 9.4%, he was on MDI regimen which was adjusted  He was subsequently trained on OmniPod use 01/2023  SUBJECTIVE:   During the last visit (01/28/2024): A1c 7.8%    Today (05/26/24): [name] is here for follow-up on diabetes management.  He checks his glucose multiple times daily through CGM.  The patient has been noted with hypoglycemia, he is symptomatic with these episodes  Patient present to the ED a few days ago with hypertensive urgency. He is feeling exhausted  No headaches  No SOB  No nausea  No constipation or diarrhea    This patient with type 1 diabetes is treated with Omnipod (insulin  pump). During the visit the pump basal and bolus doses were reviewed including carb/insulin  rations and supplemental doses. The clinical list was updated. The glucose meter download was reviewed in detail to determine if the current pump settings are providing the best  glycemic control without excessive hypoglycemia.  Pump and meter download:    Pump   Omnipod Settings   Insulin  type   Novolog    Basal rate       0000 0.45 u/h               I:C ratio       0000 1:13    Enter 52 g               Sensitivity       0000  50      Goal       0000  120         Type & Model of Pump: omnipod Insulin  Type: Currently using Novolog .  Body mass index is 28.15 kg/m.  PUMP STATISTICS: Average BG: 177 Average Daily Carbs (g): 140.3 Average Total Daily Insulin : 33.6 Average Daily Basal: 14.9 (44 %) Average Daily Bolus: 18.6(56 %)     HOME DIABETES REGIMEN: Novolog   Rosuvastatin  20 mg daily  Statin: No ACE-I/ARB: No    CONTINUOUS GLUCOSE MONITORING RECORD INTERPRETATION    Dates of Recording:11/1-11/18/2025  Sensor description: dexcom   Results statistics:   CGM use % of time 91.6  Average and SD 177/63  Time in range 59 %  % Time Above 180  26  % Time above 250 14  % Time Below target 1   Glycemic patterns summary: BGs are optimal overnight and between the meals  Hyperglycemic episodes postprandial  Hypoglycemic episodes occurred no pattern  Overnight periods: Mostly optimal    DIABETIC COMPLICATIONS: Microvascular complications:  CKD, neuropathy Denies:  Last eye exam: Completed 05/10/2021  Macrovascular complications:   Denies: CAD, PVD, CVA   PAST HISTORY: Past Medical History:  Past Medical History:  Diagnosis Date   Depression    Diabetes mellitus    Diabetes mellitus type I (HCC)    Hypertension    Prolonged QT interval 04/02/2019   PTSD (post-traumatic stress disorder)    Seizures (HCC)    Past Surgical History:  Past Surgical History:  Procedure Laterality Date   HERNIA REPAIR      Social History:  reports that he quit smoking about 20 months ago. His smoking use included cigarettes. He has never used smokeless tobacco. He reports that he does not currently use alcohol after a past usage  of about 1.0 standard drink of alcohol per week. He reports that he does not use drugs. Family History:  Family History  Problem Relation Age of Onset   Hypertension Mother    Depression Mother    Drug abuse Mother    Diabetes Paternal Uncle      HOME MEDICATIONS: Allergies as of 05/26/2024       Reactions   Geodon [ziprasidone Hcl] Other (See Comments)   Severe muscle spasms (ENTIRE BODY)- had to call EMS   Amlodipine  Hives, Swelling   Lower extremities        Medication List        Accurate as of May 26, 2024 11:28 AM. If you have any questions, ask your nurse or doctor.          Accu-Chek Guide test strip Generic drug: glucose blood USE TO test blood sugar six times EVERY DAY AS DIRECTED   Accu-Chek Guide w/Device Kit Use to check blood sugar up to 6 times daily.   Accu-Chek Softclix Lancets lancets Use to check blood sugar up to 6 times daily.   Dexcom G7 Sensor Misc Change sensors every 10 days   gabapentin  300 MG capsule Commonly known as: NEURONTIN  Take 2 capsules (600 mg total) by mouth at bedtime as needed. What changed: when to take this   Global Inject Ease Insulin  Syr 30G X 1/2 1 ML Misc Generic drug: Insulin  Syringe-Needle U-100 USE WITH insulin  DAILY in THE afternoon   hydrALAZINE  50 MG tablet Commonly known as: APRESOLINE  Take 1 tablet (50 mg total) by mouth 3 (three) times daily.   hydrOXYzine  25 MG tablet Commonly known as: ATARAX  Take 1 tablet (25 mg total) by mouth 2 (two) times daily as needed for anxiety.   insulin  aspart 100 UNIT/ML injection Commonly known as: NovoLOG  Max daily dose of 50 units into the skin daily   lisinopril -hydrochlorothiazide  20-25 MG tablet Commonly known as: ZESTORETIC  Take 1 tablet by mouth daily.   mirtazapine 7.5 MG tablet Commonly known as: REMERON Take 1 tablet (7.5 mg total) by mouth at bedtime.   Omnipod 5 G7 Pods (Gen 5) Misc 1 Device by Does not apply route every other day.    Omnipod 5 DexG7G6 Pods Gen 5 Misc Apply one device every other day.   rosuvastatin  20 MG tablet Commonly known as: Crestor  Take 1 tablet (20 mg total) by mouth daily.   Self-Taking Blood Pressure Kit Use daily.  VITAMIN D3 PO Take 1 tablet by mouth 2 (two) times a week.         ALLERGIES: Allergies  Allergen Reactions   Geodon [Ziprasidone Hcl] Other (See Comments)    Severe muscle spasms (ENTIRE BODY)- had to call EMS   Amlodipine  Hives and Swelling    Lower extremities     REVIEW OF SYSTEMS: A comprehensive ROS was conducted with the patient and is negative except as per HPI    OBJECTIVE:   VITAL SIGNS: BP (!) 144/84   Ht 5' 11 (1.803 m)   Wt 201 lb 12.8 oz (91.5 kg)   BMI 28.15 kg/m    PHYSICAL EXAM:  General: Pt appears well and is in NAD  Heart:  RRR  Lung: CTA  LE: Trace edema   Neuro: MS is good with appropriate affect, pt is alert and Ox3    DM foot exam: 05/26/2024  The skin of the feet is intact without sores or ulcerations. The pedal pulses are 2+ on right and 2+ on left. The sensation is intact to a screening 5.07, 10 gram monofilament bilaterally    DATA REVIEWED:  Lab Results  Component Value Date   HGBA1C 7.5 (A) 05/26/2024   HGBA1C 7.8 (H) 01/06/2024   HGBA1C 8.2 (A) 05/28/2023    Latest Reference Range & Units 05/25/24 02:17  Sodium 135 - 145 mmol/L 135  Potassium 3.5 - 5.1 mmol/L 4.2  Chloride 98 - 111 mmol/L 102  CO2 22 - 32 mmol/L 24  Glucose 70 - 99 mg/dL 889 (H)  BUN 6 - 20 mg/dL 37 (H)  Creatinine 9.38 - 1.24 mg/dL 7.71 (H)  Calcium  8.9 - 10.3 mg/dL 8.6 (L)  Anion gap 5 - 15  9  GFR, Estimated >60 mL/min 35 (L)    Old records , labs and images have been reviewed.   ASSESSMENT / PLAN / RECOMMENDATIONS:   1) Type 1 Diabetes Mellitus, Sub-Optimally controlled, With CKD III , and neuropathic complications - A1c 7.5%   - A1c is trending down but continues to be above goal - He is doing better with entering a  standing carbohydrate of 52 g with meals, not consistent with each meal but is doing better - He tends to override the pump, up to 45%.  I did advise the patient against overriding the pump settings, as this creates glycemic excursions and increases his risk of hyper and hypoglycemia - The patient had to reset his pump and his basal rate is higher than previous, I will decrease his basal rate to prevent hypoglycemia  MEDICATIONS: Novolog      Pump   Omnipod Settings   Insulin  type   Novolog    Basal rate       0000 0.40u/h               I:C ratio       0000 1:13    Enter 52 g withmeals              Sensitivity       0000  50      Goal       0000  120         EDUCATION / INSTRUCTIONS: BG monitoring instructions: Patient is instructed to check his blood sugars 3 times a day, before meals. Call [facility] if: BG persistently < 70  I reviewed the Rule of 15 for the treatment of hypoglycemia in detail with the patient. Literature supplied.  2) Diabetic complications:  Eye: Does not have known diabetic retinopathy.  Neuro/ Feet: Does  have known diabetic peripheral neuropathy. Renal: Patient does  have known baseline CKD. He is not on an ACEI/ARB at present.  3) Dyslipidemia :  -He was on Simvastatin  that was disconitnued for no known reason during hospitalization -I started him on rosuvastatin  12/2022 due to  high LDL at 170 mg/DL - LDL is improving   Medication  Continue rosuvastatin  20 mg daily    Follow-up in 6 months  Signed electronically by: Stefano Redgie Butts, MD  [facility]  [facility] [address] [place] [place], [place] [address] Phone: [phone] FAX: [phone]   CC: Celestia Rosaline SQUIBB, NP [address] [place] [address] Phone: [phone]  Fax: [phone]    Return to Endocrinology clinic as below: Future Appointments  Date Time Provider Department Center  06/01/2024   4:00 PM Grant Juliene RAMAN, LCSW [facility] None  06/08/2024  3:30 PM Celestia Rosaline SQUIBB, NP [facility] [address]  06/11/2024  2:30 PM Graham Krabbe, MD [facility] None  11/24/2024 10:50 AM Averlee Swartz, Donell Redgie, MD [facility] None  12/08/2024  1:30 PM Orman Erminio POUR, PA-C [facility] None

## 2024-05-26 NOTE — Patient Instructions (Signed)
 Enter 52 grams with a regular meal with each meal     - HOW TO TREAT LOW BLOOD SUGARS (Blood sugar LESS THAN 70 MG/DL) Please follow the RULE OF 15 for the treatment of hypoglycemia treatment (when your (blood sugars are less than 70 mg/dL)   STEP 1: Take 15 grams of carbohydrates when your blood sugar is low, which includes:  3-4 GLUCOSE TABS  OR 3-4 OZ OF JUICE OR REGULAR SODA OR ONE TUBE OF GLUCOSE GEL    STEP 2: RECHECK blood sugar in 15 MINUTES STEP 3: If your blood sugar is still low at the 15 minute recheck --> then, go back to STEP 1 and treat AGAIN with another 15 grams of carbohydrates.

## 2024-05-27 ENCOUNTER — Telehealth: Payer: Self-pay

## 2024-05-27 NOTE — Telephone Encounter (Unsigned)
 Copied from CRM [id number]. Topic: General - Other >> May 27, 2024 12:59 PM Hadassah PARAS wrote: Reason for CRM: Pt is returning Marvis Bradley, RN call. Please call pt back at [phone]

## 2024-05-27 NOTE — Telephone Encounter (Signed)
 Call returned to patient and documented in another [facility] call today.

## 2024-05-27 NOTE — Transitions of Care (Post Inpatient/ED Visit) (Signed)
   05/27/2024  Name: [name] MRN: [id number] DOB: 06-09-1976  Today's TOC FU Call Status: Today's TOC FU Call Status:: Unsuccessful Call (2nd Attempt) Unsuccessful Call (1st Attempt) Date: 05/26/24 Unsuccessful Call (2nd Attempt) Date: 05/27/24  Attempted to reach the patient regarding the most recent Inpatient/ED visit.  Follow Up Plan: Additional outreach attempts will be made to reach the patient to complete the Transitions of Care (Post Inpatient/ED visit) call.   Signature  Slater Diesel, RN

## 2024-05-27 NOTE — Transitions of Care (Post Inpatient/ED Visit) (Signed)
 05/27/2024  Name: [name] MRN: [id number] DOB: October 15, 1975  Today's TOC FU Call Status: Today's TOC FU Call Status:: Successful TOC FU Call Completed Unsuccessful Call (1st Attempt) Date: 05/26/24 Unsuccessful Call (2nd Attempt) Date: 05/27/24 [facility] FU Call Complete Date: 05/27/24  Patient's Name and Date of Birth confirmed. DOB, Name  Transition Care Management Follow-up Telephone Call Date of Discharge: 05/25/24 Discharge Facility: [facility]) Type of Discharge: Inpatient Admission Primary Inpatient Discharge Diagnosis:: acute CHF How have you been since you were released from the hospital?: Better Any questions or concerns?: Yes Patient Questions/Concerns:: He said he feels  a little strange when he takes the hydralazine  stating that it makes him feel dizzy and sluggish.  He has a home BP monitor and reports that his BP has been  okay and reported today's BP: 134/88.    He is also asking about the status of the diabetic shoes that were ordered months ago. Patient Questions/Concerns Addressed: Notified Provider of Patient Questions/Concerns  Items Reviewed: Did you receive and understand the discharge instructions provided?: Yes Medications obtained,verified, and reconciled?: Yes (Medications Reviewed) (He has all medications except the vitamin D  that he needs to obtain.  He also has a Dexcom , a standard glucometer and a home BP monitor.) Any new allergies since your discharge?: No Dietary orders reviewed?: Yes Type of Diet Ordered:: heart healthy, low sodium, carb modified. Do you have support at home?: Yes People in Home [RPT]: friend(s) Name of Support/Comfort Primary Source: He stated that he stays with a friend and he ends up helping the friend.  he also confirmed that he has been in contact with his [facility] tailored care manager  Medications Reviewed Today: Medications Reviewed Today     Reviewed by Marvis Bradley, RN (Case Manager) on 05/27/24 at 1449  Med  List Status: <None>   Medication Order Taking? Sig Documenting Provider Last Dose Status Informant  Accu-Chek Softclix Lancets lancets [id number]  Use to check blood sugar up to 6 times daily. Celestia Rosaline SQUIBB, NP  Active Self  Blood Glucose Monitoring Suppl (ACCU-CHEK GUIDE) w/Device KIT [id number]  Use to check blood sugar up to 6 times daily. Celestia Rosaline SQUIBB, NP  Active Self  Blood Pressure KIT [id number]  Use daily. Vann, Jessica U, DO  Active   Cholecalciferol (VITAMIN D3 PO) [id number]  Take 1 tablet by mouth 2 (two) times a week.  Patient not taking: Reported on 05/27/2024   [provider]  Active            Med Note (MARROW, ERIN T   Sat May 23, 2024  1:34 PM) Pt reports taking this medication two times a week on Mon and Thurs   Continuous Glucose Sensor (DEXCOM G7 SENSOR) MISC [id number]  Change sensors every 10 days Shamleffer, Ibtehal Jaralla, MD  Active   gabapentin  (NEURONTIN ) 300 MG capsule [id number]  Take 2 capsules (600 mg total) by mouth at bedtime as needed.  Patient taking differently: Take 600 mg by mouth at bedtime.   Bahraini, Sarah A  Active   GLOBAL INJECT EASE INSULIN  SYR 30G X 1/2 1 ML MISC [id number]  USE WITH insulin  DAILY in THE afternoon [provider]  Active   glucose blood (ACCU-CHEK GUIDE) test strip [id number]  USE TO test blood sugar six times EVERY DAY AS DIRECTED Delbert Clam, MD  Active   hydrALAZINE  (APRESOLINE ) 50 MG tablet [id number]  Take 1 tablet (50 mg total) by mouth 3 (three) times daily. Vann,  Harlene PENNER, DO  Active   hydrOXYzine  (ATARAX ) 25 MG tablet [id number]  Take 1 tablet (25 mg total) by mouth 2 (two) times daily as needed for anxiety. Bahraini, Lauraine LABOR  Active   insulin  aspart (NOVOLOG ) 100 UNIT/ML injection [id number]  Max daily dose of 50 units into the skin daily Shamleffer, Ibtehal Jaralla, MD  Active            Med Note (MARROW, ERIN T   Sat May 23, 2024  1:36 PM) Pt reports receiving this insulin  continuously  through his insulin  pump    Insulin  Disposable Pump (OMNIPOD 5 DEXG7G6 PODS GEN 5) MISC [id number]  Apply one device every other day. Shamleffer, Donell Cardinal, MD  Active   Insulin  Disposable Pump (OMNIPOD 5 G7 PODS, GEN 5,) MISC [id number]  1 Device by Does not apply route every other day. Shamleffer, Ibtehal Jaralla, MD  Active   lisinopril -hydrochlorothiazide  (ZESTORETIC ) 20-25 MG tablet [id number]  Take 1 tablet by mouth daily. Celestia Rosaline SQUIBB, NP  Active   mirtazapine (REMERON) 7.5 MG tablet [id number]  Take 1 tablet (7.5 mg total) by mouth at bedtime. Bahraini, Lauraine LABOR  Active   rosuvastatin  (CRESTOR ) 20 MG tablet [id number]  Take 1 tablet (20 mg total) by mouth daily. Celestia Rosaline SQUIBB, NP  Active             Home Care and Equipment/Supplies: Were Home Health Services Ordered?: No Any new equipment or medical supplies ordered?: No  Functional Questionnaire: Do you need assistance with bathing/showering or dressing?: No Do you need assistance with meal preparation?: No Do you need assistance with eating?: No Do you have difficulty maintaining continence: No Do you need assistance with getting out of bed/getting out of a chair/moving?: No Do you have difficulty managing or taking your medications?: No  Follow up appointments reviewed: PCP Follow-up appointment confirmed?: Yes Date of PCP follow-up appointment?: 06/08/24 Follow-up Provider: Rosaline Celestia, NP Specialist Hospital Follow-up appointment confirmed?: Yes Date of Specialist follow-up appointment?: 05/26/24 Follow-Up Specialty Provider:: He saw endocrinologist yesterday and he said he has an appointment with nephrology at the end of January 2026. Do you need transportation to your follow-up appointment?: No (He said he has managed to get to his appointments.  I gave him the number for [facility] transportation and explained they they provide rides to medical appointments with a 2-3 day notice: [phone].) Do  you understand care options if your condition(s) worsen?: Yes-patient verbalized understanding    SIGNATURE Slater Diesel, RN

## 2024-05-27 NOTE — Telephone Encounter (Signed)
 [name] He has an appt with you 06/08/2024   He said he feels  a little strange when he takes the hydralazine  stating that it makes him feel dizzy and sluggish.  He has a home BP monitor and reports that his BP has been  okay and reported today's BP: 134/88.      He is also asking about the status of the diabetic shoes that were ordered months ago.

## 2024-06-01 ENCOUNTER — Ambulatory Visit (INDEPENDENT_AMBULATORY_CARE_PROVIDER_SITE_OTHER): Payer: MEDICAID | Admitting: Licensed Clinical Social Worker

## 2024-06-01 DIAGNOSIS — F4310 Post-traumatic stress disorder, unspecified: Secondary | ICD-10-CM

## 2024-06-01 DIAGNOSIS — F331 Major depressive disorder, recurrent, moderate: Secondary | ICD-10-CM

## 2024-06-01 NOTE — Progress Notes (Unsigned)
 THERAPIST PROGRESS NOTE  Virtual Visit via Video Note  I connected with Andrzej N Armbrust on 06/02/24 at  4:00 PM EST by a video enabled telemedicine application and verified that I am speaking with the correct person using two identifiers.  Location: Patient: [facility]  Provider: Providers Home Office    I discussed the limitations of evaluation and management by telemedicine and the availability of in person appointments. The patient expressed understanding and agreed to proceed.      I discussed the assessment and treatment plan with the patient. The patient was provided an opportunity to ask questions and all were answered. The patient agreed with the plan and demonstrated an understanding of the instructions.   The patient was advised to call back or seek an in-person evaluation if the symptoms worsen or if the condition fails to improve as anticipated.  I provided 45 minutes of non-face-to-face time during this encounter.   Juliene GORMAN Patee, LCSW   Participation Level: Active  Behavioral Response: CasualAlertAnxious, Hopeless, and Worthless  Type of Therapy: Individual Therapy  Treatment Goals addressed:  Active     BH CCP Acute or Chronic Trauma Reaction     LTG: Recall traumatic events without becoming overwhelmed with negative emotions (Progressing)     Start:  10/18/23    Expected End:  10/09/24         STG: Bedford will cooperate with and complete psychological testing to help evaluate trauma symptoms (Progressing)     Start:  10/18/23    Expected End:  10/09/24         STG: Joao will describe the signs and symptoms of PTSD that are experienced and how they interfere with daily living (Progressing)     Start:  10/18/23    Expected End:  10/09/24         STG: Jamill will acknowledge that healing from PTSD is a gradual process (Progressing)     Start:  10/18/23    Expected End:  10/09/24         STG: Cristoval will identify negative coping strategies that  have been used to cope with the feelings associated with the trauma     Start:  10/18/23    Expected End:  10/09/24         STG: Tesla will identify coping strategies to deal with trauma memories and the associated emotional reaction     Start:  10/18/23    Expected End:  10/09/24         STG: Nykeem will verbalize an increased sense of mastery over PTSD symptoms by using several techniques to cope with flashbacks, decrease the power of triggers, and decrease negative thinking     Start:  10/18/23    Expected End:  10/09/24         [name]      Start:  10/18/23         Refer Harjot to a psychiatrist for a consultation regarding medication management of symptoms      Start:  10/18/23         Instruct Shakeel to communicate effects of prescribed medications (Completed)     Start:  10/18/23    End:  11/28/23      Provide Linkyn with education on trauma-oriented therapy (Completed)     Start:  10/18/23    End:  11/28/23      Provide and outline the  treatment process to Melecio, explaining that it will include a gradual processing of the details and feelings associated with the trauma and developing new, more appropriate coping strategies (Completed)     Start:  10/18/23    End:  11/28/23        OP Depression     LTG: Reduce frequency, intensity, and duration of depression symptoms so that daily functioning is improved (Progressing)     Start:  10/18/23    Expected End:  10/09/24         LTG: Increase coping skills to manage depression and improve ability to perform daily activities (Progressing)     Start:  10/18/23    Expected End:  10/09/24         LTG: Raun will score less than 5 on the Patient Health Questionnaire (PHQ-9)      Start:  10/18/23    Expected End:  10/09/24         STG: Prospero will participate in at least 80% of scheduled individual psychotherapy sessions   (Progressing)     Start:  10/18/23    Expected End:  10/09/24         STG: Arif will complete at least 80% of assigned homework  (Progressing)     Start:  10/18/23    Expected End:  10/09/24         STG: Jahel will reduce frequency of avoidant behaviors by 50% as evidenced by self-report in therapy sessions (Progressing)     Start:  10/18/23    Expected End:  10/09/24            ProgressTowards Goals: Progressing  Interventions: CBT, Motivational Interviewing, and Supportive  Summary: WELDEN HAUSMANN is a 48 y.o. male who presents with   Suicidal/Homicidal: Nowithout intent/plan  Therapist Response:     Landon was alert and oriented 5. He was pleasant, cooperative, maintained good eye contact, and was casually dressed. He engaged well in the therapy session and presented with a euthymic mood and appropriate affect.  Patient reports primary stressors related to setting healthy boundaries. He states he is often the first to say "yes" to requests, which has led others to form high expectations. When he does say "no," others perceive him as having a "problem," which contributes to his frustration, tension, and irritability. Patient states he has begun prioritizing himself by recognizing his limits and "normalizing no."  Patient also reports ongoing tension with his father. Although they remain in contact, Latavious feels frustrated by how his father has handled situations involving Roshon's children. For example, when his son's birthday approaches, his father responds by saying, "I don't want to get in the middle of it; I will not give you their phone numbers."  O: Objective  Alert, oriented 5  Pleasant, cooperative, good eye contact  Casually dressed  Mood: euthymic  Affect: appropriate  Engaged actively in session  A: Assessment Patient continues to experience stress related to interpersonal boundaries and strained family dynamics, particularly with his father. He is  demonstrating insight into his patterns of overcommitment and is beginning to set healthy limits to support his emotional well-being. Patient is receptive to therapeutic interventions and expresses motivation to continue personal growth. He displays appropriate affect and stable mood, indicating good session engagement.  P: Plan / Interventions  Provided education on setting and maintaining healthy boundaries.  Utilized supportive therapy for praise and encouragement regarding boundary-setting efforts with family and friends.  Applied psychodynamic therapy techniques to facilitate  expression of thoughts, feelings, and emotions in a nonjudgmental environment.  Employed person-centered therapy to support empowerment and autonomy.  Utilized motivational interviewing, including reflective listening, open-ended questions, and positive affirmations.  Reviewed LCSW's upcoming transfer to the [facility] office.  Advised patient that due to [facility], he will transfer to a new therapist at the [facility].  Patient verbalized agreement with the plan and will await a callback from administrative staff for scheduling once new provider schedules open during the first week of December.  Plan: Return again in 3 weeks.  Diagnosis: PTSD (post-traumatic stress disorder)  MDD (major depressive disorder), recurrent episode, moderate (HCC)   Patient/Guardian was advised Release of Information must be obtained prior to any record release in order to collaborate their care with an outside provider. Patient/Guardian was advised if they have not already done so to contact the registration department to sign all necessary forms in order for us  to release information regarding their care.   Consent: Patient/Guardian gives verbal consent for treatment and assignment of benefits for services provided during this visit. Patient/Guardian expressed  understanding and agreed to proceed.   Juliene GORMAN Patee, LCSW 06/02/2024
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
## 2024-06-08 ENCOUNTER — Encounter (INDEPENDENT_AMBULATORY_CARE_PROVIDER_SITE_OTHER): Payer: Self-pay | Admitting: Primary Care

## 2024-06-08 ENCOUNTER — Ambulatory Visit (INDEPENDENT_AMBULATORY_CARE_PROVIDER_SITE_OTHER): Payer: MEDICAID | Admitting: Primary Care

## 2024-06-08 VITALS — BP 158/73 | HR 68 | Resp 16 | Wt 204.2 lb

## 2024-06-08 DIAGNOSIS — Z1211 Encounter for screening for malignant neoplasm of colon: Secondary | ICD-10-CM

## 2024-06-08 DIAGNOSIS — N183 Chronic kidney disease, stage 3 unspecified: Secondary | ICD-10-CM

## 2024-06-08 DIAGNOSIS — E785 Hyperlipidemia, unspecified: Secondary | ICD-10-CM

## 2024-06-08 DIAGNOSIS — E1022 Type 1 diabetes mellitus with diabetic chronic kidney disease: Secondary | ICD-10-CM | POA: Diagnosis not present

## 2024-06-08 DIAGNOSIS — E1069 Type 1 diabetes mellitus with other specified complication: Secondary | ICD-10-CM

## 2024-06-08 DIAGNOSIS — Z09 Encounter for follow-up examination after completed treatment for conditions other than malignant neoplasm: Secondary | ICD-10-CM

## 2024-06-08 NOTE — Progress Notes (Signed)
 Subjective:   [name] is a 48 y.o. male presents for hospital follow up presents to ED with complaint of feeling of  whole body swelling and chest pain x 1 day . Admit date to the hospital was 05/22/24, patient was discharged from the hospital on 05/25/24, patient was admitted for:  Fluid overload. GFR 35 referral to nephrology Follow up with Jerrye Katheryn BROCKS, MD (Nephrology) in January 2026.  Pressure readings systolic ranges from 192-145 and diastolic ranges from 79-91  Past Medical History:  Diagnosis Date   Depression    Diabetes mellitus    Diabetes mellitus type I (HCC)    Hypertension    Prolonged QT interval 04/02/2019   PTSD (post-traumatic stress disorder)    Seizures (HCC)      Allergies  Allergen Reactions   Geodon [Ziprasidone Hcl] Other (See Comments)    Severe muscle spasms (ENTIRE BODY)- had to call EMS   Amlodipine  Hives and Swelling    Lower extremities    Current Outpatient Medications on File Prior to Visit  Medication Sig Dispense Refill   Accu-Chek Softclix Lancets lancets Use to check blood sugar up to 6 times daily. 100 each 6   Blood Glucose Monitoring Suppl (ACCU-CHEK GUIDE) w/Device KIT Use to check blood sugar up to 6 times daily. 1 kit 0   Blood Pressure KIT Use daily. 1 kit 0   Cholecalciferol (VITAMIN D3 PO) Take 1 tablet by mouth 2 (two) times a week. (Patient not taking: Reported on 05/27/2024)     Continuous Glucose Sensor (DEXCOM G7 SENSOR) MISC Change sensors every 10 days 9 each 3   gabapentin  (NEURONTIN ) 300 MG capsule Take 2 capsules (600 mg total) by mouth at bedtime as needed. (Patient taking differently: Take 600 mg by mouth at bedtime.) 60 capsule 2   GLOBAL INJECT EASE INSULIN  SYR 30G X 1/2 1 ML MISC USE WITH insulin  DAILY in THE afternoon     glucose blood (ACCU-CHEK GUIDE) test strip USE TO test blood sugar six times EVERY DAY AS DIRECTED 100 strip 3   hydrALAZINE  (APRESOLINE ) 50 MG tablet Take 1 tablet (50 mg total) by mouth 3  (three) times daily. 90 tablet 1   hydrOXYzine  (ATARAX ) 25 MG tablet Take 1 tablet (25 mg total) by mouth 2 (two) times daily as needed for anxiety. 60 tablet 2   insulin  aspart (NOVOLOG ) 100 UNIT/ML injection Max daily dose of 50 units into the skin daily 50 mL 3   Insulin  Disposable Pump (OMNIPOD 5 DEXG7G6 PODS GEN 5) MISC Apply one device every other day. 45 each 2   Insulin  Disposable Pump (OMNIPOD 5 G7 PODS, GEN 5,) MISC 1 Device by Does not apply route every other day. 45 each 2   lisinopril -hydrochlorothiazide  (ZESTORETIC ) 20-25 MG tablet Take 1 tablet by mouth daily. 90 tablet 1   mirtazapine  (REMERON ) 7.5 MG tablet Take 1 tablet (7.5 mg total) by mouth at bedtime. 30 tablet 2   rosuvastatin  (CRESTOR ) 20 MG tablet Take 1 tablet (20 mg total) by mouth daily. 90 tablet 1   No current facility-administered medications on file prior to visit.    Review of System: ROS Comprehensive ROS Pertinent positive and negative noted in HPI   Objective:  BP (!) 158/73   Pulse 68   Resp 16   Wt 204 lb 3.2 oz (92.6 kg)   SpO2 95%   BMI 28.48 kg/m   Filed Weights   06/08/24 1428  Weight: 204 lb 3.2  oz (92.6 kg)   BP readings at home Physical Exam Vitals reviewed.  Constitutional:      Appearance: He is obese.  HENT:     Head: Normocephalic.     Right Ear: Tympanic membrane, ear canal and external ear normal.     Left Ear: Tympanic membrane, ear canal and external ear normal.     Nose: Nose normal.  Eyes:     Extraocular Movements: Extraocular movements intact.     Pupils: Pupils are equal, round, and reactive to light.  Cardiovascular:     Rate and Rhythm: Normal rate and regular rhythm.  Pulmonary:     Effort: Pulmonary effort is normal.     Breath sounds: Normal breath sounds.  Abdominal:     General: Bowel sounds are normal. There is distension.     Palpations: Abdomen is soft.  Musculoskeletal:        General: Normal range of motion.  Skin:    General: Skin is warm and  dry.  Neurological:     Mental Status: He is oriented to person, place, and time.  Psychiatric:        Mood and Affect: Mood normal.        Behavior: Behavior normal.        Thought Content: Thought content normal.        Judgment: Judgment normal.      Assessment:  Abbie was seen today for hospitalization follow-up.  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 Any transcriptional errors are unintentional.   Return for annual physical.  Rosaline SHAUNNA Bohr, NP 06/08/2024, 3:42 PM

## 2024-06-08 NOTE — Patient Instructions (Signed)
 Follow up with Jerrye Katheryn BROCKS, MD (Nephrology)

## 2024-06-09 ENCOUNTER — Ambulatory Visit: Payer: Self-pay

## 2024-06-09 LAB — BASIC METABOLIC PANEL WITH GFR
BUN/Creatinine Ratio: 17 (ref 9–20)
BUN: 36 mg/dL — ABNORMAL HIGH (ref 6–24)
CO2: 23 mmol/L (ref 20–29)
Calcium: 9.2 mg/dL (ref 8.7–10.2)
Chloride: 105 mmol/L (ref 96–106)
Creatinine, Ser: 2.09 mg/dL — ABNORMAL HIGH (ref 0.76–1.27)
Glucose: 217 mg/dL — ABNORMAL HIGH (ref 70–99)
Potassium: 5.1 mmol/L (ref 3.5–5.2)
Sodium: 139 mmol/L (ref 134–144)
eGFR: 39 mL/min/1.73 — ABNORMAL LOW (ref >59)

## 2024-06-09 NOTE — Telephone Encounter (Signed)
 FYI Only or Action Required?: Action required by provider: update on patient condition. Please advise  Patient was last seen in primary care on 06/08/2024 by Celestia Rosaline SQUIBB, NP.  Called Nurse Triage reporting Bleeding/Bruising.  Symptoms began several days ago.  Interventions attempted: Rest, hydration, or home remedies.  Symptoms are: gradually worsening.  Triage Disposition: See PCP Within 2 Weeks  Patient/caregiver understands and will follow disposition?: Yes  Copied from CRM #[id number]. Topic: Clinical - Red Word Triage >> Jun 09, 2024  1:32 PM Olam RAMAN wrote: Red Word that prompted transfer to Nurse Triage: pt went home after appt 12/1 and noticed leg leg/left side of body felt some inflation and also saw some swelling. BP was ok but last hosp visit was ext high and some swelling. When pt went to get blood work, pt bled out during labs. When taking insulin  he bleeds a lot and feels something is wrong. Reason for Disposition  [1] MILD swelling of both ankles (i.e., pedal edema) AND [2] is a chronic symptom (recurrent or ongoing AND present > 4 weeks)  Answer Assessment - Initial Assessment Questions Pt in ED about 2 weeks ago, HFU appt yesterday- BP sustaining 150-160's. Still have swelling, L>R in the legs- mostly ankles and feet. Taking hydralazine , and lisinopril -hydrochlorothiazide . Ginger, lemon tea to help inflammation.  Pt states he has noticed that when he got blood work and when he changes out his glucose monitor that he is bleeding for longer then he usually is. Typically he clots immediately. Denies abnormal bruising. Has some redness around monitor site. CBC was not done yesterday at [facility] but Platelet count inpatient in 200s.  Please advise on next steps. Patient due to change monitor site tonight and will report back if bleeding is still prolonged.   1. ONSET: When did the swelling start? (e.g., minutes, hours, days)     Since D/c from inpatient  2. LOCATION: What  part of the leg is swollen?  Are both legs swollen or just one leg?     Left leg >Right leg-  3. SEVERITY: How bad is the swelling? (e.g., localized; mild, moderate, severe)     Mild-mod- noticeable on observation  4. REDNESS: Is there redness or signs of infection?     Denies  5. PAIN: Is the swelling painful to touch? If Yes, ask: How painful is it?   (Scale 1-10; mild, moderate or severe)     Aches: 2-3/10-- worse overnight   6. FEVER: Do you have a fever? If Yes, ask: What is it, how was it measured, and when did it start?      denies 7. CAUSE: What do you think is causing the leg swelling?     HTN 8. MEDICAL HISTORY: Do you have a history of blood clots (e.g., DVT), cancer, heart failure, kidney disease, or liver failure?     CKD3 9. RECURRENT SYMPTOM: Have you had leg swelling before? If Yes, ask: When was the last time? What happened that time?     2 weeks ago  10. OTHER SYMPTOMS: Do you have any other symptoms? (e.g., chest pain, difficulty breathing)       Prolonged bleeding times  Protocols used: Leg Swelling and Edema-A-AH

## 2024-06-10 NOTE — Telephone Encounter (Signed)
 Noted. Patient voiced he will let us  know if he continues to have prolong bleeding

## 2024-06-11 ENCOUNTER — Other Ambulatory Visit: Payer: Self-pay

## 2024-06-11 ENCOUNTER — Telehealth (HOSPITAL_COMMUNITY): Payer: MEDICAID | Admitting: Psychiatry

## 2024-06-11 DIAGNOSIS — F4310 Post-traumatic stress disorder, unspecified: Secondary | ICD-10-CM

## 2024-06-11 DIAGNOSIS — F331 Major depressive disorder, recurrent, moderate: Secondary | ICD-10-CM

## 2024-06-11 DIAGNOSIS — F6381 Intermittent explosive disorder: Secondary | ICD-10-CM

## 2024-06-11 MED ORDER — MIRTAZAPINE 7.5 MG PO TABS
7.5000 mg | ORAL_TABLET | Freq: Every day | ORAL | 0 refills | Status: AC
  Filled 2024-06-11 – 2024-07-23 (×2): qty 90, 90d supply, fill #0

## 2024-06-11 MED ORDER — GABAPENTIN 300 MG PO CAPS
600.0000 mg | ORAL_CAPSULE | Freq: Every day | ORAL | 0 refills | Status: AC
  Filled 2024-06-11 – 2024-06-22 (×2): qty 180, 90d supply, fill #0

## 2024-06-12 ENCOUNTER — Other Ambulatory Visit: Payer: Self-pay

## 2024-06-15 ENCOUNTER — Other Ambulatory Visit (INDEPENDENT_AMBULATORY_CARE_PROVIDER_SITE_OTHER): Payer: Self-pay | Admitting: Primary Care

## 2024-06-15 ENCOUNTER — Ambulatory Visit (INDEPENDENT_AMBULATORY_CARE_PROVIDER_SITE_OTHER): Payer: Self-pay | Admitting: Primary Care

## 2024-06-15 DIAGNOSIS — N1831 Chronic kidney disease, stage 3a: Secondary | ICD-10-CM

## 2024-06-22 ENCOUNTER — Emergency Department (HOSPITAL_COMMUNITY): Payer: MEDICAID

## 2024-06-22 ENCOUNTER — Ambulatory Visit: Payer: Self-pay

## 2024-06-22 ENCOUNTER — Other Ambulatory Visit: Payer: Self-pay

## 2024-06-22 ENCOUNTER — Emergency Department (HOSPITAL_COMMUNITY)
Admission: EM | Admit: 2024-06-22 | Discharge: 2024-06-23 | Disposition: A | Discharge status: Home / Self Care | Payer: MEDICAID | Attending: Emergency Medicine | Admitting: Emergency Medicine

## 2024-06-22 ENCOUNTER — Encounter (HOSPITAL_COMMUNITY): Payer: Self-pay

## 2024-06-22 DIAGNOSIS — R030 Elevated blood-pressure reading, without diagnosis of hypertension: Secondary | ICD-10-CM | POA: Diagnosis present

## 2024-06-22 LAB — TROPONIN I (HIGH SENSITIVITY)
Troponin I (High Sensitivity): 8 ng/L (ref <18)
Troponin I (High Sensitivity): 7 ng/L (ref <18)

## 2024-06-22 LAB — CBC
HCT: 40 % (ref 39.0–52.0)
Hemoglobin: 13.1 g/dL (ref 13.0–17.0)
MCH: 28.2 pg (ref 26.0–34.0)
MCHC: 32.8 g/dL (ref 30.0–36.0)
MCV: 86.2 fL (ref 80.0–100.0)
Platelets: 224 K/uL (ref 150–400)
RBC: 4.64 MIL/uL (ref 4.22–5.81)
RDW: 13.2 % (ref 11.5–15.5)
WBC: 4.8 K/uL (ref 4.0–10.5)
nRBC: 0 % (ref 0.0–0.2)

## 2024-06-22 LAB — BASIC METABOLIC PANEL WITH GFR
Anion gap: 9 (ref 5–15)
BUN: 36 mg/dL — ABNORMAL HIGH (ref 6–20)
CO2: 25 mmol/L (ref 22–32)
Calcium: 8.5 mg/dL — ABNORMAL LOW (ref 8.9–10.3)
Chloride: 105 mmol/L (ref 98–111)
Creatinine, Ser: 2.35 mg/dL — ABNORMAL HIGH (ref 0.61–1.24)
GFR, Estimated: 34 mL/min — ABNORMAL LOW (ref >60)
Glucose, Bld: 198 mg/dL — ABNORMAL HIGH (ref 70–99)
Potassium: 4.6 mmol/L (ref 3.5–5.1)
Sodium: 139 mmol/L (ref 135–145)

## 2024-06-22 NOTE — Telephone Encounter (Signed)
 FYI Only or Action Required?: FYI only for provider: ED advised.  Patient was last seen in primary care on 06/08/2024 by Celestia Rosaline SQUIBB, NP.  Called Nurse Triage reporting Hypertension.  Symptoms began today.  Interventions attempted: Prescription medications: lisinopril , hydralazine .  Symptoms are: unchanged.  Triage Disposition: Go to ED Now (Notify PCP)  Patient/caregiver understands and will follow disposition?: Yes   Copied from CRM #[id number]. Topic: Clinical - Red Word Triage >> Jun 22, 2024  3:24 PM Larissa RAMAN wrote: [facility] that prompted transfer to Nurse Triage: blood pressure 193/101, numbness and pain in hands and feet. >> Jun 22, 2024  3:37 PM Larissa S wrote: Patient states BP is now 210/104.  Reason for Disposition  [1] Systolic BP >= 160 OR Diastolic >= 100 AND [2] cardiac (e.g., breathing difficulty, chest pain) or neurologic symptoms (e.g., new-onset blurred or double vision, unsteady gait)  Answer Assessment - Initial Assessment Questions Advised ED now and someone to drive. Nurse offered to call 911, patient declines and reports will have neighbor to drive.  Advised 911 if symptoms worsen, patient verbalized understanding.  1. BLOOD PRESSURE: What is your blood pressure? Did you take at least two measurements 5 minutes apart?     Last BP 210/104 2. ONSET: When did you take your blood pressure?     today 3. HOW: How did you take your blood pressure? (e.g., automatic home BP monitor, visiting nurse)     Nurse instructed to recheck BP now: 194/93 HR 62 4. HISTORY: Do you have a history of high blood pressure?     htn 5. MEDICINES: Are you taking any medicines for blood pressure? Have you missed any doses recently?     0900-Lisinopril , Hydralazine  2:30 Hydralazine  6. OTHER SYMPTOMS: Do you have any symptoms? (e.g., blurred vision, chest pain, difficulty breathing, headache, weakness) Denies chest pain, diff breathing, weakness/  numbness Dizziness, unsteadiness if moving too fast  Protocols used: Blood Pressure - High-A-AH

## 2024-06-22 NOTE — ED Triage Notes (Signed)
 Pt states bp at 1700 today 210/101. Pt c/o int dizziness at 1200 today.

## 2024-06-22 NOTE — ED Triage Notes (Signed)
 Quick triage note: Pt to ED c/o high blood pressure, reports hx of the same and taking medications as prescribed.  Denies HA, Denies Vision changes.

## 2024-06-22 NOTE — ED Provider Triage Note (Signed)
 Emergency Medicine Provider Triage Evaluation Note  [name] , a 48 y.o. male  was evaluated in triage.  Pt complains of shoulder chest discomfort and elevated blood pressure readings at home.  Reports he has been compliant with his blood pressure medicine and still maintains systolics between 180 and 200.  Review of Systems  Positive: HTN, CP Negative: HA, fever  Physical Exam  BP (!) 176/85 (BP Location: Left Arm)   Pulse 67   Temp 98.4 F (36.9 C)   Resp 16   SpO2 98%  Gen:   Awake, no distress   Resp:  Normal effort  MSK:   Moves extremities without difficulty  Other:    Medical Decision Making  Medically screening exam initiated at 7:49 PM.  Appropriate orders placed.  [name] was informed that the remainder of the evaluation will be completed by another provider, this initial triage assessment does not replace that evaluation, and the importance of remaining in the ED until their evaluation is complete.     Shermon Warren LOISE, PA-C 06/22/24 1949

## 2024-06-23 ENCOUNTER — Encounter (HOSPITAL_COMMUNITY): Payer: Self-pay

## 2024-06-23 ENCOUNTER — Ambulatory Visit (HOSPITAL_COMMUNITY): Payer: MEDICAID

## 2024-06-23 ENCOUNTER — Telehealth (HOSPITAL_COMMUNITY): Payer: Self-pay

## 2024-06-23 NOTE — ED Provider Notes (Signed)
 [facility]PT [facility] Emergency Department Provider Note MRN:  [id number]  Arrival date & time: 06/23/2024     Chief Complaint   Hypertension   History of Present Illness   [name] is a 48 y.o. year-old male presents to the ED with chief complaint of elevated BP.  States that his BPs have been hard to control and yesterday his BP was much higher than normal.  States that he is being worked up for this and his kidney disease by his PCP.  Has follow-up with nephrology.  States that he had some SOB earlier, but not now.  States that he has had some swelling in his legs.  Recent echo showed normal EF.  He states that he had been exercising and his BP was improving, but then it got cold and he quit exercising due to not wanting to go out.  Has follow-up with nephrology in 1 day.  History provided by patient.   Review of Systems  Pertinent positive and negative review of systems noted in HPI.    Physical Exam   Vitals:   06/22/24 1837 06/22/24 2218  BP: (!) 176/85 (!) 166/58  Pulse: 67 (!) 58  Resp: 16 15  Temp: 98.4 F (36.9 C) 98 F (36.7 C)  SpO2: 98% 99%    CONSTITUTIONAL:  non toxic-appearing, NAD NEURO:  Alert and oriented x 3, CN 3-12 grossly intact EYES:  eyes equal and reactive ENT/NECK:  Supple, no stridor  CARDIO:  normal rate, regular rhythm, appears well-perfused  PULM:  No respiratory distress, CTAB GI/GU:  non-distended, no focal tenderness MSK/SPINE:  No gross deformities, no edema, moves all extremities  SKIN:  no rash, atraumatic   *Additional and/or pertinent findings included in MDM below  Diagnostic and Interventional Summary    EKG Interpretation Date/Time:    Ventricular Rate:    PR Interval:    QRS Duration:    QT Interval:    QTC Calculation:   R Axis:      Text Interpretation:         Labs Reviewed  BASIC METABOLIC PANEL WITH GFR - Abnormal; Notable for the following components:      Result Value   Glucose, Bld  198 (*)    BUN 36 (*)    Creatinine, Ser 2.35 (*)    Calcium  8.5 (*)    GFR, Estimated 34 (*)    All other components within normal limits  CBC  TROPONIN I (HIGH SENSITIVITY)  TROPONIN I (HIGH SENSITIVITY)    DG Chest 2 View  Final Result      Medications - No data to display   Procedures  /  Critical Care Procedures  ED Course and Medical Decision Making  I have reviewed the triage vital signs, the nursing notes, and pertinent available records from the EMR.  Social Determinants Affecting Complexity of Care: Patient has no clinically significant social determinants affecting this chief complaint..   ED Course:    Medical Decision Making Patient here with elevated blood pressures.  States that he has been working on better blood pressure control with medication and with diet and exercise, but states that today's blood pressures were reading in the 200s.  States that this was abnormally high for him, so he wanted to come in to be evaluated.  He denies chest pain.  Denies any shortness of breath present.  Troponins are negative.  No acute ischemic changes on EKG.  Doubt ACS.  His creatinine is slightly  increased from most recent tests, but not significantly different.  He has follow-up with nephrology in 1 day.  He may need further management of his antihypertensive medications, but will defer any changes to his nephrologist.  He is asymptomatic at present, feel that he can be safely discharged since his blood pressure has decreased.  Patient is understanding and agreeable with plan.         Consultants: No consultations were needed in caring for this patient.   Treatment and Plan: I considered admission due to patient's initial presentation, but after considering the examination and diagnostic results, patient will not require admission and can be discharged with outpatient follow-up.    Final Clinical Impressions(s) / ED Diagnoses     ICD-10-CM   1. Elevated blood  pressure reading  R03.0       ED Discharge Orders     None         Discharge Instructions Discussed with and Provided to Patient:     Discharge Instructions      Keep your appointment with your kidney doctor.  Continue taking your blood pressure medications as prescribed.  Check your blood pressure in the morning and in the evening.  Return for new or worsening symptoms.       Vicky Charleston, PA-C 06/23/24 [id number]    Rogelia Jerilynn RAMAN, MD 06/23/24 [phone]

## 2024-06-23 NOTE — Discharge Instructions (Signed)
 Keep your appointment with your kidney doctor.  Continue taking your blood pressure medications as prescribed.  Check your blood pressure in the morning and in the evening.  Return for new or worsening symptoms.

## 2024-06-24 ENCOUNTER — Other Ambulatory Visit: Payer: Self-pay

## 2024-06-24 MED ORDER — HYDROCHLOROTHIAZIDE 25 MG PO TABS
25.0000 mg | ORAL_TABLET | Freq: Every day | ORAL | 2 refills | Status: DC
  Filled 2024-06-24: qty 30, 30d supply, fill #0

## 2024-06-24 MED ORDER — LISINOPRIL 40 MG PO TABS
40.0000 mg | ORAL_TABLET | Freq: Every day | ORAL | 2 refills | Status: DC
  Filled 2024-06-24: qty 30, 30d supply, fill #0

## 2024-06-24 NOTE — Telephone Encounter (Signed)
 Reached out to pt to schedule an appt pt states [facility] and [facility] changed some of his meds.  Pt will reach out to kidney doctor and get office note faxed and pt will come to the office tomorrow to do a quick follow up with pcp

## 2024-06-25 ENCOUNTER — Encounter (INDEPENDENT_AMBULATORY_CARE_PROVIDER_SITE_OTHER): Payer: Self-pay | Admitting: Primary Care

## 2024-06-25 ENCOUNTER — Ambulatory Visit (INDEPENDENT_AMBULATORY_CARE_PROVIDER_SITE_OTHER): Payer: MEDICAID | Admitting: Primary Care

## 2024-06-25 ENCOUNTER — Other Ambulatory Visit: Payer: Self-pay

## 2024-06-25 ENCOUNTER — Telehealth (INDEPENDENT_AMBULATORY_CARE_PROVIDER_SITE_OTHER): Payer: Self-pay | Admitting: Primary Care

## 2024-06-25 VITALS — BP 132/64 | HR 77 | Resp 16 | Wt 207.2 lb

## 2024-06-25 DIAGNOSIS — Z09 Encounter for follow-up examination after completed treatment for conditions other than malignant neoplasm: Secondary | ICD-10-CM | POA: Diagnosis not present

## 2024-06-25 DIAGNOSIS — E1069 Type 1 diabetes mellitus with other specified complication: Secondary | ICD-10-CM | POA: Diagnosis not present

## 2024-06-25 DIAGNOSIS — E785 Hyperlipidemia, unspecified: Secondary | ICD-10-CM | POA: Diagnosis not present

## 2024-06-25 DIAGNOSIS — I152 Hypertension secondary to endocrine disorders: Secondary | ICD-10-CM

## 2024-06-25 MED ORDER — HYDROCHLOROTHIAZIDE 25 MG PO TABS
25.0000 mg | ORAL_TABLET | Freq: Every day | ORAL | 2 refills | Status: AC
  Filled 2024-06-25: qty 30, 30d supply, fill #0
  Filled 2024-07-23: qty 30, 30d supply, fill #1

## 2024-06-25 MED ORDER — LISINOPRIL 40 MG PO TABS
40.0000 mg | ORAL_TABLET | Freq: Every day | ORAL | 2 refills | Status: AC
  Filled 2024-06-25: qty 30, 30d supply, fill #0
  Filled 2024-07-23: qty 30, 30d supply, fill #1

## 2024-06-25 NOTE — Telephone Encounter (Signed)
Client was a no show.

## 2024-06-25 NOTE — Telephone Encounter (Signed)
 Copied from CRM [id number]. Topic: Clinical - Medication Question >> Jun 25, 2024  1:42 PM Victoria B wrote: Reason for CRM: Patient called in confused about lisinopril  -hydrochlorithyzide He states the kidney Dr wanted to separate this medication, so he isnt sure what's going on with that

## 2024-06-25 NOTE — Progress Notes (Unsigned)
 Subjective:   [name] is a 48 y.o. male presents to ED for elevated BP . Admit date to the hospital was 06/22/24, patient was discharged from the hospital on 06/23/24, patient was admitted for:   Past Medical History:  Diagnosis Date   Depression    Diabetes mellitus    Diabetes mellitus type I (HCC)    Hypertension    Prolonged QT interval 04/02/2019   PTSD (post-traumatic stress disorder)    Seizures (HCC)      Allergies[1]  Medications Ordered Prior to Encounter[2]  Review of System: ROS Comprehensive ROS Pertinent positive and negative noted in HPI   Objective:  There were no vitals taken for this visit.  There were no vitals filed for this visit.  Physical Exam Vitals reviewed.  HENT:     Head: Normocephalic.     Right Ear: Tympanic membrane and external ear normal.     Left Ear: Tympanic membrane and external ear normal.     Nose: Nose normal.  Eyes:     Extraocular Movements: Extraocular movements intact.     Pupils: Pupils are equal, round, and reactive to light.  Cardiovascular:     Rate and Rhythm: Normal rate and regular rhythm.  Pulmonary:     Effort: Pulmonary effort is normal.     Breath sounds: Normal breath sounds.  Abdominal:     General: Bowel sounds are normal. There is distension.     Palpations: Abdomen is soft.  Musculoskeletal:        General: Normal range of motion.  Skin:    General: Skin is warm and dry.  Neurological:     Mental Status: He is oriented to person, place, and time.  Psychiatric:        Mood and Affect: Mood normal.        Behavior: Behavior normal.        Thought Content: Thought content normal.        Judgment: Judgment normal.      Assessment:  Hyperlipidemia due to type 1 diabetes mellitus (HCC)    This note has been created with Education officer, environmental. Any transcriptional errors are unintentional.   No follow-ups on file.  Rosaline SHAUNNA Bohr,  NP 06/25/2024, 11:23 AM        [1]  Allergies Allergen Reactions   Geodon [Ziprasidone Hcl] Other (See Comments)    Severe muscle spasms (ENTIRE BODY)- had to call EMS   Amlodipine  Hives and Swelling    Lower extremities  [2]  Current Outpatient Medications on File Prior to Visit  Medication Sig Dispense Refill   Accu-Chek Softclix Lancets lancets Use to check blood sugar up to 6 times daily. 100 each 6   Blood Glucose Monitoring Suppl (ACCU-CHEK GUIDE) w/Device KIT Use to check blood sugar up to 6 times daily. 1 kit 0   Blood Pressure KIT Use daily. 1 kit 0   Cholecalciferol (VITAMIN D3 PO) Take 1 tablet by mouth 2 (two) times a week. (Patient not taking: Reported on 05/27/2024)     Continuous Glucose Sensor (DEXCOM G7 SENSOR) MISC Change sensors every 10 days 9 each 3   gabapentin  (NEURONTIN ) 300 MG capsule Take 2 capsules (600 mg total) by mouth at bedtime. 180 capsule 0   GLOBAL INJECT EASE INSULIN  SYR 30G X 1/2 1 ML MISC USE WITH insulin  DAILY in THE afternoon     glucose blood (ACCU-CHEK GUIDE) test strip USE TO test blood  sugar six times EVERY DAY AS DIRECTED 100 strip 3   hydrALAZINE  (APRESOLINE ) 50 MG tablet Take 1 tablet (50 mg total) by mouth 3 (three) times daily. 90 tablet 1   hydrOXYzine  (ATARAX ) 25 MG tablet Take 1 tablet (25 mg total) by mouth 2 (two) times daily as needed for anxiety. 60 tablet 2   insulin  aspart (NOVOLOG ) 100 UNIT/ML injection Max daily dose of 50 units into the skin daily 50 mL 3   Insulin  Disposable Pump (OMNIPOD 5 DEXG7G6 PODS GEN 5) MISC Apply one device every other day. 45 each 2   Insulin  Disposable Pump (OMNIPOD 5 G7 PODS, GEN 5,) MISC 1 Device by Does not apply route every other day. 45 each 2   lisinopril -hydrochlorothiazide  (ZESTORETIC ) 20-25 MG tablet Take 1 tablet by mouth daily. 90 tablet 1   mirtazapine  (REMERON ) 7.5 MG tablet Take 1 tablet (7.5 mg total) by mouth at bedtime. 90 tablet 0   No current facility-administered medications  on file prior to visit.

## 2024-06-26 ENCOUNTER — Other Ambulatory Visit: Payer: Self-pay

## 2024-06-26 NOTE — Telephone Encounter (Signed)
 Client missed initial visit on Dec. 16th.

## 2024-06-26 NOTE — Telephone Encounter (Signed)
 Will forward to provider

## 2024-06-30 ENCOUNTER — Telehealth: Payer: Self-pay

## 2024-06-30 ENCOUNTER — Other Ambulatory Visit (HOSPITAL_COMMUNITY): Payer: Self-pay

## 2024-06-30 NOTE — Telephone Encounter (Signed)
 Copied from CRM #[id number]. Topic: Clinical - Prescription Issue >> Jun 30, 2024  4:18 PM Tobias CROME wrote: Reason for CRM: Patient states he was told by pharmacy that he picked up hydralazine  11//17/25 and it was too soon to pick up. Patient states he is out of the medication and has been unable to pick up medication.

## 2024-07-06 ENCOUNTER — Other Ambulatory Visit: Payer: Self-pay

## 2024-07-07 ENCOUNTER — Other Ambulatory Visit: Payer: Self-pay

## 2024-07-07 MED ORDER — AMOXICILLIN 500 MG PO CAPS
500.0000 mg | ORAL_CAPSULE | Freq: Three times a day (TID) | ORAL | 0 refills | Status: AC
  Filled 2024-07-07: qty 30, 10d supply, fill #0

## 2024-07-10 ENCOUNTER — Ambulatory Visit (HOSPITAL_COMMUNITY): Payer: MEDICAID

## 2024-07-10 ENCOUNTER — Telehealth (HOSPITAL_COMMUNITY): Payer: Self-pay

## 2024-07-10 ENCOUNTER — Encounter (HOSPITAL_COMMUNITY): Payer: Self-pay

## 2024-07-10 NOTE — Telephone Encounter (Signed)
 No show. I sent him two messages at 8:02am and 8:06am

## 2024-07-22 ENCOUNTER — Encounter (INDEPENDENT_AMBULATORY_CARE_PROVIDER_SITE_OTHER): Payer: Self-pay | Admitting: Primary Care

## 2024-07-22 ENCOUNTER — Ambulatory Visit (INDEPENDENT_AMBULATORY_CARE_PROVIDER_SITE_OTHER): Payer: MEDICAID | Admitting: Primary Care

## 2024-07-22 VITALS — BP 144/80 | HR 60 | Resp 16 | Ht 71.0 in | Wt 199.8 lb

## 2024-07-22 DIAGNOSIS — N1831 Chronic kidney disease, stage 3a: Secondary | ICD-10-CM

## 2024-07-22 NOTE — Progress Notes (Signed)
 " [facility]  [name], is a 49 y.o. male  RDW:[id number]  FMW:[id number]  DOB - 03-Feb-1976  Chief Complaint  Patient presents with   Blood Pressure Check   Referral    Pt is requesting new referral to a nephrologist        Subjective:   [name] is a 49 y.o. male here today for a follow up visit. Requesting a new referral to nephrologist feels not getting the care he deserves. BP elevated secondary to CKD. Concerned about fluctuating Bp. Patient has No headache, No chest pain, No abdominal pain - No Nausea, No new weakness tingling or numbness, No Cough - shortness of breath HPI  No problems updated.  Comprehensive ROS Pertinent positive and negative noted in HPI   Allergies[1]  Past Medical History:  Diagnosis Date   Depression    Diabetes mellitus    Diabetes mellitus type I (HCC)    Hypertension    Prolonged QT interval 04/02/2019   PTSD (post-traumatic stress disorder)    Seizures (HCC)     Medications Ordered Prior to Encounter[2] Health Maintenance  Topic Date Due   Hepatitis B Vaccine (1 of 3 - 19+ 3-dose series) Never done   Pneumococcal Vaccine (2 of 2 - PCV) 07/09/2008   Eye exam for diabetics  05/10/2022   Colon Cancer Screening  Never done   COVID-19 Vaccine (1 - 2025-26 season) Never done   Flu Shot  10/06/2024*   Hemoglobin A1C  11/23/2024   Yearly kidney health urinalysis for diabetes  01/05/2025   Complete foot exam   05/26/2025   Yearly kidney function blood test for diabetes  06/22/2025   DTaP/Tdap/Td vaccine (4 - Td or Tdap) 09/11/2033   Hepatitis C Screening  Completed   HIV Screening  Completed   HPV Vaccine  Aged Out   Meningitis B Vaccine  Aged Out  *Topic was postponed. The date shown is not the original due date.    Objective:   Vitals:   07/22/24 1357  BP: (!) 144/80  Pulse: 60  Resp: 16  SpO2: 100%  Weight: 199 lb 12.8 oz (90.6 kg)  Height: 5' 11 (1.803 m)   BP Readings from Last 3 Encounters:   07/22/24 (!) 144/80  06/25/24 132/64  06/23/24 (!) 202/82        Assessment & Plan  [name] was seen today for blood pressure check and referral.  Diagnoses and all orders for this visit:  Stage 3a chronic kidney disease (HCC) -     Ambulatory referral to Nephrology     Patient have been counseled extensively about nutrition and exercise. Other issues discussed during this visit include: low cholesterol diet, weight control and daily exercise, foot care, annual eye examinations at Ophthalmology, importance of adherence with medications and regular follow-up. We also discussed long term complications of uncontrolled diabetes and hypertension.   Return in about 3 months (around 10/20/2024), or if symptoms worsen or fail to improve.  The patient was given clear instructions to go to ER or return to medical center if symptoms don't improve, worsen or new problems develop. The patient verbalized understanding. The patient was told to call to get lab results if they haven't heard anything in the next week.   This note has been created with Education officer, environmental. Any transcriptional errors are unintentional.   Donald SHAUNNA Bohr, NP 07/22/2024, 2:28 PM    [1]  Allergies Allergen Reactions   Geodon [  Ziprasidone Hcl] Other (See Comments)    Severe muscle spasms (ENTIRE BODY)- had to call EMS   Amlodipine  Hives and Swelling    Lower extremities  [2]  Current Outpatient Medications on File Prior to Visit  Medication Sig Dispense Refill   Accu-Chek Softclix Lancets lancets Use to check blood sugar up to 6 times daily. 100 each 6   amoxicillin  (AMOXIL ) 500 MG capsule Take 1 capsule (500 mg total) by mouth 3 (three) times daily FOR 10 DAYS. 30 capsule 0   Blood Glucose Monitoring Suppl (ACCU-CHEK GUIDE) w/Device KIT Use to check blood sugar up to 6 times daily. 1 kit 0   Blood Pressure KIT Use daily. 1 kit 0   Cholecalciferol (VITAMIN D3 PO) Take 1  tablet by mouth 2 (two) times a week. (Patient not taking: Reported on 05/27/2024)     Continuous Glucose Sensor (DEXCOM G7 SENSOR) MISC Change sensors every 10 days 9 each 3   gabapentin  (NEURONTIN ) 300 MG capsule Take 2 capsules (600 mg total) by mouth at bedtime. 180 capsule 0   GLOBAL INJECT EASE INSULIN  SYR 30G X 1/2 1 ML MISC USE WITH insulin  DAILY in THE afternoon     glucose blood (ACCU-CHEK GUIDE) test strip USE TO test blood sugar six times EVERY DAY AS DIRECTED 100 strip 3   hydrALAZINE  (APRESOLINE ) 50 MG tablet Take 1 tablet (50 mg total) by mouth 3 (three) times daily. 90 tablet 1   hydrochlorothiazide  (HYDRODIURIL ) 25 MG tablet Take 1 tablet (25 mg total) by mouth daily. 30 tablet 2   hydrOXYzine  (ATARAX ) 25 MG tablet Take 1 tablet (25 mg total) by mouth 2 (two) times daily as needed for anxiety. 60 tablet 2   insulin  aspart (NOVOLOG ) 100 UNIT/ML injection Max daily dose of 50 units into the skin daily 50 mL 3   Insulin  Disposable Pump (OMNIPOD 5 DEXG7G6 PODS GEN 5) MISC Apply one device every other day. 45 each 2   Insulin  Disposable Pump (OMNIPOD 5 G7 PODS, GEN 5,) MISC 1 Device by Does not apply route every other day. 45 each 2   lisinopril  (ZESTRIL ) 40 MG tablet Take 1 tablet (40 mg total) by mouth daily. 30 tablet 2   mirtazapine  (REMERON ) 7.5 MG tablet Take 1 tablet (7.5 mg total) by mouth at bedtime. 90 tablet 0   [DISCONTINUED] lisinopril -hydrochlorothiazide  (ZESTORETIC ) 20-25 MG tablet Take 1 tablet by mouth daily. 90 tablet 1   No current facility-administered medications on file prior to visit.   "

## 2024-07-23 ENCOUNTER — Other Ambulatory Visit: Payer: Self-pay

## 2024-08-05 ENCOUNTER — Other Ambulatory Visit: Payer: Self-pay
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
## 2024-08-06 ENCOUNTER — Other Ambulatory Visit: Payer: Self-pay

## 2024-08-07 ENCOUNTER — Other Ambulatory Visit: Payer: Self-pay

## 2024-08-10 ENCOUNTER — Other Ambulatory Visit: Payer: Self-pay

## 2024-08-10 ENCOUNTER — Encounter: Payer: Self-pay | Admitting: Gastroenterology

## 2024-08-13 ENCOUNTER — Encounter (HOSPITAL_COMMUNITY): Payer: MEDICAID | Admitting: Psychiatry

## 2024-11-24 ENCOUNTER — Ambulatory Visit: Payer: MEDICAID | Admitting: Internal Medicine

## 2024-12-08 ENCOUNTER — Ambulatory Visit: Payer: MEDICAID | Admitting: Physician Assistant
